# Patient Record
Sex: Female | Born: 1960
Health system: Southern US, Community
[De-identification: ages and names within clinical notes are randomized; demographics above are authoritative.]

## PROBLEM LIST (undated history)

## (undated) DIAGNOSIS — I499 Cardiac arrhythmia, unspecified: Secondary | ICD-10-CM

## (undated) DIAGNOSIS — S83281A Other tear of lateral meniscus, current injury, right knee, initial encounter: Secondary | ICD-10-CM

## (undated) DIAGNOSIS — R011 Cardiac murmur, unspecified: Secondary | ICD-10-CM

## (undated) DIAGNOSIS — M352 Behcet's disease: Secondary | ICD-10-CM

## (undated) DIAGNOSIS — R112 Nausea with vomiting, unspecified: Secondary | ICD-10-CM

## (undated) DIAGNOSIS — M810 Age-related osteoporosis without current pathological fracture: Secondary | ICD-10-CM

## (undated) DIAGNOSIS — I639 Cerebral infarction, unspecified: Secondary | ICD-10-CM

## (undated) DIAGNOSIS — H9319 Tinnitus, unspecified ear: Secondary | ICD-10-CM

## (undated) DIAGNOSIS — N719 Inflammatory disease of uterus, unspecified: Secondary | ICD-10-CM

## (undated) DIAGNOSIS — H539 Unspecified visual disturbance: Secondary | ICD-10-CM

## (undated) DIAGNOSIS — F32A Depression, unspecified: Secondary | ICD-10-CM

## (undated) DIAGNOSIS — I8003 Phlebitis and thrombophlebitis of superficial vessels of lower extremities, bilateral: Secondary | ICD-10-CM

## (undated) DIAGNOSIS — IMO0001 Reserved for inherently not codable concepts without codable children: Secondary | ICD-10-CM

## (undated) DIAGNOSIS — K589 Irritable bowel syndrome without diarrhea: Secondary | ICD-10-CM

## (undated) DIAGNOSIS — K9041 Non-celiac gluten sensitivity: Secondary | ICD-10-CM

## (undated) DIAGNOSIS — J189 Pneumonia, unspecified organism: Secondary | ICD-10-CM

## (undated) DIAGNOSIS — K529 Noninfective gastroenteritis and colitis, unspecified: Secondary | ICD-10-CM

## (undated) DIAGNOSIS — M359 Systemic involvement of connective tissue, unspecified: Secondary | ICD-10-CM

## (undated) DIAGNOSIS — M1990 Unspecified osteoarthritis, unspecified site: Secondary | ICD-10-CM

## (undated) DIAGNOSIS — K219 Gastro-esophageal reflux disease without esophagitis: Secondary | ICD-10-CM

## (undated) DIAGNOSIS — M21379 Foot drop, unspecified foot: Secondary | ICD-10-CM

## (undated) DIAGNOSIS — M542 Cervicalgia: Secondary | ICD-10-CM

## (undated) DIAGNOSIS — J45909 Unspecified asthma, uncomplicated: Secondary | ICD-10-CM

## (undated) DIAGNOSIS — Z9889 Other specified postprocedural states: Secondary | ICD-10-CM

## (undated) DIAGNOSIS — D573 Sickle-cell trait: Secondary | ICD-10-CM

## (undated) DIAGNOSIS — H5310 Unspecified subjective visual disturbances: Secondary | ICD-10-CM

## (undated) DIAGNOSIS — Z531 Procedure and treatment not carried out because of patient's decision for reasons of belief and group pressure: Secondary | ICD-10-CM

## (undated) DIAGNOSIS — G8929 Other chronic pain: Secondary | ICD-10-CM

## (undated) DIAGNOSIS — I219 Acute myocardial infarction, unspecified: Secondary | ICD-10-CM

## (undated) DIAGNOSIS — E2740 Unspecified adrenocortical insufficiency: Secondary | ICD-10-CM

## (undated) DIAGNOSIS — F329 Major depressive disorder, single episode, unspecified: Secondary | ICD-10-CM

## (undated) DIAGNOSIS — F419 Anxiety disorder, unspecified: Secondary | ICD-10-CM

## (undated) DIAGNOSIS — N281 Cyst of kidney, acquired: Secondary | ICD-10-CM

## (undated) DIAGNOSIS — H209 Unspecified iridocyclitis: Secondary | ICD-10-CM

## (undated) DIAGNOSIS — J340 Abscess, furuncle and carbuncle of nose: Secondary | ICD-10-CM

## (undated) DIAGNOSIS — M549 Dorsalgia, unspecified: Secondary | ICD-10-CM

## (undated) HISTORY — DX: Cardiac arrhythmia, unspecified: I49.9

## (undated) HISTORY — DX: Unspecified adrenocortical insufficiency: E27.40

## (undated) HISTORY — DX: Other tear of lateral meniscus, current injury, right knee, initial encounter: S83.281A

## (undated) HISTORY — PX: SUBMANDIBULAR MASS EXCISION: SHX5310

## (undated) HISTORY — PX: BACK SURGERY: SHX140

## (undated) HISTORY — DX: Behcet's disease: M35.2

## (undated) HISTORY — PX: OOPHORECTOMY: SHX86

## (undated) HISTORY — DX: Cardiac murmur, unspecified: R01.1

## (undated) HISTORY — DX: Inflammatory disease of uterus, unspecified: N71.9

## (undated) HISTORY — DX: Age-related osteoporosis without current pathological fracture: M81.0

## (undated) HISTORY — DX: Unspecified osteoarthritis, unspecified site: M19.90

## (undated) HISTORY — DX: Irritable bowel syndrome, unspecified: K58.9

## (undated) HISTORY — DX: Systemic involvement of connective tissue, unspecified: M35.9

## (undated) HISTORY — PX: HYSTEROSCOPY: SHX211

## (undated) HISTORY — PX: LUMBAR DISC SURGERY: SHX700

## (undated) HISTORY — PX: SPINE SURGERY: SHX786

## (undated) HISTORY — DX: Gastro-esophageal reflux disease without esophagitis: K21.9

## (undated) HISTORY — DX: Noninfective gastroenteritis and colitis, unspecified: K52.9

## (undated) HISTORY — PX: DILATION AND CURETTAGE OF UTERUS: SHX78

## (undated) HISTORY — PX: PELVIC LAPAROSCOPY: SHX162

## (undated) HISTORY — PX: AXILLARY SURGERY: SHX892

## (undated) HISTORY — DX: Non-celiac gluten sensitivity: K90.41

## (undated) HISTORY — DX: Unspecified subjective visual disturbances: H53.10

## (undated) HISTORY — DX: Unspecified iridocyclitis: H20.9

## (undated) HISTORY — PX: BREAST SURGERY: SHX581

---
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
## 1984-01-06 HISTORY — PX: BREAST EXCISIONAL BIOPSY: SUR124

## 1998-04-06 ENCOUNTER — Emergency Department (HOSPITAL_COMMUNITY): Admission: EM | Admit: 1998-04-06 | Discharge: 1998-04-06 | Payer: Self-pay | Admitting: Emergency Medicine

## 1998-05-06 HISTORY — PX: KNEE SURGERY: SHX244

## 1998-07-08 ENCOUNTER — Encounter: Payer: Self-pay | Admitting: Obstetrics and Gynecology

## 1998-07-12 ENCOUNTER — Ambulatory Visit (HOSPITAL_COMMUNITY): Admission: RE | Admit: 1998-07-12 | Discharge: 1998-07-12 | Payer: Self-pay | Admitting: Obstetrics and Gynecology

## 1998-07-24 ENCOUNTER — Other Ambulatory Visit: Admission: RE | Admit: 1998-07-24 | Discharge: 1998-07-24 | Payer: Self-pay | Admitting: Obstetrics and Gynecology

## 1998-08-09 ENCOUNTER — Ambulatory Visit (HOSPITAL_COMMUNITY): Admission: RE | Admit: 1998-08-09 | Discharge: 1998-08-09 | Payer: Self-pay | Admitting: Obstetrics and Gynecology

## 1998-08-09 ENCOUNTER — Encounter: Payer: Self-pay | Admitting: Obstetrics and Gynecology

## 2000-09-08 ENCOUNTER — Other Ambulatory Visit: Admission: RE | Admit: 2000-09-08 | Discharge: 2000-09-08 | Payer: Self-pay | Admitting: Gynecology

## 2001-01-06 ENCOUNTER — Observation Stay (HOSPITAL_COMMUNITY): Admission: AD | Admit: 2001-01-06 | Discharge: 2001-01-07 | Payer: Self-pay | Admitting: Gynecology

## 2001-01-06 ENCOUNTER — Encounter: Payer: Self-pay | Admitting: Gynecology

## 2001-03-07 ENCOUNTER — Inpatient Hospital Stay (HOSPITAL_COMMUNITY): Admission: AD | Admit: 2001-03-07 | Discharge: 2001-03-07 | Payer: Self-pay | Admitting: Gynecology

## 2001-03-08 ENCOUNTER — Encounter (INDEPENDENT_AMBULATORY_CARE_PROVIDER_SITE_OTHER): Payer: Self-pay

## 2001-03-08 ENCOUNTER — Inpatient Hospital Stay (HOSPITAL_COMMUNITY): Admission: AD | Admit: 2001-03-08 | Discharge: 2001-03-11 | Payer: Self-pay | Admitting: Gynecology

## 2001-03-12 ENCOUNTER — Encounter: Admission: RE | Admit: 2001-03-12 | Discharge: 2001-04-11 | Payer: Self-pay | Admitting: Gynecology

## 2001-04-12 ENCOUNTER — Encounter: Admission: RE | Admit: 2001-04-12 | Discharge: 2001-05-12 | Payer: Self-pay | Admitting: Gynecology

## 2001-04-20 ENCOUNTER — Other Ambulatory Visit: Admission: RE | Admit: 2001-04-20 | Discharge: 2001-04-20 | Payer: Self-pay | Admitting: Gynecology

## 2001-05-12 ENCOUNTER — Encounter: Admission: RE | Admit: 2001-05-12 | Discharge: 2001-06-11 | Payer: Self-pay | Admitting: Gynecology

## 2001-07-05 HISTORY — PX: HAND SURGERY: SHX662

## 2001-07-12 ENCOUNTER — Encounter: Admission: RE | Admit: 2001-07-12 | Discharge: 2001-08-11 | Payer: Self-pay | Admitting: Gynecology

## 2001-08-12 ENCOUNTER — Encounter: Admission: RE | Admit: 2001-08-12 | Discharge: 2001-09-11 | Payer: Self-pay | Admitting: Gynecology

## 2001-09-21 ENCOUNTER — Ambulatory Visit (HOSPITAL_COMMUNITY): Admission: RE | Admit: 2001-09-21 | Discharge: 2001-09-21 | Payer: Self-pay | Admitting: Orthopedic Surgery

## 2002-05-31 ENCOUNTER — Other Ambulatory Visit: Admission: RE | Admit: 2002-05-31 | Discharge: 2002-05-31 | Payer: Self-pay | Admitting: Obstetrics and Gynecology

## 2003-02-12 ENCOUNTER — Encounter (INDEPENDENT_AMBULATORY_CARE_PROVIDER_SITE_OTHER): Payer: Self-pay | Admitting: *Deleted

## 2003-02-12 ENCOUNTER — Ambulatory Visit (HOSPITAL_COMMUNITY): Admission: RE | Admit: 2003-02-12 | Discharge: 2003-02-12 | Payer: Self-pay | Admitting: Gastroenterology

## 2003-03-14 ENCOUNTER — Encounter: Admission: RE | Admit: 2003-03-14 | Discharge: 2003-03-14 | Payer: Self-pay | Admitting: Gastroenterology

## 2003-04-04 ENCOUNTER — Encounter: Admission: RE | Admit: 2003-04-04 | Discharge: 2003-04-04 | Payer: Self-pay | Admitting: Internal Medicine

## 2003-07-18 ENCOUNTER — Other Ambulatory Visit: Admission: RE | Admit: 2003-07-18 | Discharge: 2003-07-18 | Payer: Self-pay | Admitting: Obstetrics and Gynecology

## 2003-11-21 ENCOUNTER — Encounter: Admission: RE | Admit: 2003-11-21 | Discharge: 2003-11-21 | Payer: Self-pay | Admitting: Internal Medicine

## 2003-12-05 ENCOUNTER — Encounter: Admission: RE | Admit: 2003-12-05 | Discharge: 2003-12-05 | Payer: Self-pay | Admitting: Internal Medicine

## 2004-01-06 DIAGNOSIS — M352 Behcet's disease: Secondary | ICD-10-CM | POA: Insufficient documentation

## 2004-07-04 ENCOUNTER — Encounter: Admission: RE | Admit: 2004-07-04 | Discharge: 2004-07-04 | Payer: Self-pay | Admitting: Obstetrics and Gynecology

## 2004-08-06 ENCOUNTER — Other Ambulatory Visit: Admission: RE | Admit: 2004-08-06 | Discharge: 2004-08-06 | Payer: Self-pay | Admitting: Obstetrics and Gynecology

## 2004-12-05 HISTORY — PX: CHOLECYSTECTOMY: SHX55

## 2004-12-24 ENCOUNTER — Inpatient Hospital Stay (HOSPITAL_COMMUNITY): Admission: AD | Admit: 2004-12-24 | Discharge: 2004-12-26 | Payer: Self-pay | Admitting: General Surgery

## 2004-12-25 ENCOUNTER — Encounter (INDEPENDENT_AMBULATORY_CARE_PROVIDER_SITE_OTHER): Payer: Self-pay | Admitting: *Deleted

## 2005-01-09 ENCOUNTER — Encounter: Admission: RE | Admit: 2005-01-09 | Discharge: 2005-01-09 | Payer: Self-pay | Admitting: General Surgery

## 2005-03-05 ENCOUNTER — Encounter: Admission: RE | Admit: 2005-03-05 | Discharge: 2005-03-05 | Payer: Self-pay | Admitting: Internal Medicine

## 2005-04-01 ENCOUNTER — Encounter: Admission: RE | Admit: 2005-04-01 | Discharge: 2005-04-01 | Payer: Self-pay | Admitting: Obstetrics and Gynecology

## 2005-10-09 ENCOUNTER — Encounter: Admission: RE | Admit: 2005-10-09 | Discharge: 2005-10-09 | Payer: Self-pay | Admitting: Internal Medicine

## 2005-11-11 ENCOUNTER — Other Ambulatory Visit: Admission: RE | Admit: 2005-11-11 | Discharge: 2005-11-11 | Payer: Self-pay | Admitting: Obstetrics and Gynecology

## 2005-12-02 ENCOUNTER — Ambulatory Visit (HOSPITAL_BASED_OUTPATIENT_CLINIC_OR_DEPARTMENT_OTHER): Admission: RE | Admit: 2005-12-02 | Discharge: 2005-12-02 | Payer: Self-pay | Admitting: Obstetrics and Gynecology

## 2005-12-02 ENCOUNTER — Encounter (INDEPENDENT_AMBULATORY_CARE_PROVIDER_SITE_OTHER): Payer: Self-pay | Admitting: Specialist

## 2006-03-29 ENCOUNTER — Ambulatory Visit: Payer: Self-pay | Admitting: Internal Medicine

## 2006-07-06 ENCOUNTER — Inpatient Hospital Stay (HOSPITAL_COMMUNITY): Admission: EM | Admit: 2006-07-06 | Discharge: 2006-07-10 | Payer: Self-pay | Admitting: Emergency Medicine

## 2006-11-17 ENCOUNTER — Other Ambulatory Visit: Admission: RE | Admit: 2006-11-17 | Discharge: 2006-11-17 | Payer: Self-pay | Admitting: Obstetrics and Gynecology

## 2007-02-23 ENCOUNTER — Encounter: Admission: RE | Admit: 2007-02-23 | Discharge: 2007-02-23 | Payer: Self-pay | Admitting: Internal Medicine

## 2007-07-06 HISTORY — PX: VAGINAL HYSTERECTOMY: SUR661

## 2007-08-01 ENCOUNTER — Encounter: Payer: Self-pay | Admitting: Obstetrics and Gynecology

## 2007-08-01 ENCOUNTER — Ambulatory Visit (HOSPITAL_BASED_OUTPATIENT_CLINIC_OR_DEPARTMENT_OTHER): Admission: RE | Admit: 2007-08-01 | Discharge: 2007-08-01 | Payer: Self-pay | Admitting: Obstetrics and Gynecology

## 2007-09-14 ENCOUNTER — Ambulatory Visit: Payer: Self-pay | Admitting: Obstetrics and Gynecology

## 2008-08-03 ENCOUNTER — Encounter: Admission: RE | Admit: 2008-08-03 | Discharge: 2008-08-03 | Payer: Self-pay | Admitting: Internal Medicine

## 2009-05-29 ENCOUNTER — Other Ambulatory Visit: Admission: RE | Admit: 2009-05-29 | Discharge: 2009-05-29 | Payer: Self-pay | Admitting: Internal Medicine

## 2010-01-25 ENCOUNTER — Encounter: Payer: Self-pay | Admitting: Internal Medicine

## 2010-01-26 ENCOUNTER — Encounter: Payer: Self-pay | Admitting: Obstetrics and Gynecology

## 2010-03-17 ENCOUNTER — Other Ambulatory Visit: Payer: Self-pay | Admitting: Sports Medicine

## 2010-03-17 DIAGNOSIS — M545 Low back pain, unspecified: Secondary | ICD-10-CM

## 2010-03-19 ENCOUNTER — Ambulatory Visit
Admission: RE | Admit: 2010-03-19 | Discharge: 2010-03-19 | Disposition: A | Discharge status: Home / Self Care | Payer: Commercial Managed Care - PPO | Source: Ambulatory Visit | Attending: Sports Medicine | Admitting: Sports Medicine

## 2010-03-19 DIAGNOSIS — M545 Low back pain, unspecified: Secondary | ICD-10-CM

## 2010-05-20 NOTE — Op Note (Signed)
[name], [name]           ACCOUNT NO.:  [id number]   MEDICAL RECORD NO.:  [id number]          PATIENT TYPE:  AMB   LOCATION:  [facility]                         FACILITY:  [facility]   PHYSICIAN:  Daniel L. Gottsegen, M.D.DATE OF BIRTH:  [birth year]   DATE OF PROCEDURE:  08/01/2007  DATE OF DISCHARGE:                               OPERATIVE REPORT   PREOPERATIVE DIAGNOSES:  1. Endometriosis.  2. Pelvic pain.  3. Dysmenorrhea.  4. Menorrhagia.   POSTOPERATIVE DIAGNOSIS:  1. Endometriosis.  2. Pelvic pain.  3. Dysmenorrhea.  4. Menorrhagia.   NAME OF OPERATION:  Diagnostic laparoscopy with laparoscopic-assisted  vaginal hysterectomy, bilateral salpingo-oophorectomy.   SURGEON:  Edyth Gunnels, MD.   FIRST ASSISTANT:  Reynaldo Minium, MD.   ANESTHESIA:  General endotracheal.   INDICATIONS:  Patient is a 50 year old gravida 4, para 1, AB 3 who has  had progressively increasing dysmenorrhea, menorrhagia and pelvic pain.  She has a very positive history for endometriosis.  She has had 2  previous laparoscopies in her past with relief of pain both times, both  of these revealed endometriosis involving uterus, ovaries and pelvic  peritoneum.  She is now through with childbearing.  She has tried oral  progestins and other treatments for endometriosis but it has failed to  resolve her symptoms  She has Behcet's syndrome and she has been told by  her rheumatologist, Dr. Phylliss Bob, that this is the perfect window to have  her surgery as he feels her vasculitis will probably exacerbate again.  She therefore enters the hospital for the above.  She understands the  issues with removing both her ovaries at 46 and hormone replacement, but  feels that permanent pain relief is more beneficial then the risks.   FINDINGS:  At the time of surgery patient's left ovary and tube were  adherent to the broad ligament laterally.  There was obvious  endometriosis present both on the ovary and the pelvic  peritoneum.  The  uterus was enlarged by myomas to about twice normal size.  There was  endometriosis on the uterosacral ligaments and the serosa of the uterus.  The right ovary was slightly enlarged and on ultrasound it appeared to  have a small endometrioma and it was not opened during the surgery.  The  fallopian tube was normal.  There was some pelvic adhesive disease on  the right, just some endometriosis on the uterosacral ligaments and the  broad ligament.   PROCEDURE:  After adequate general endotracheal anesthesia, the patient  was placed in the dorsal supine position, prepped and draped in the  usual sterile manner.  A Hulka catheter was inserted into the patient's  uterus, a Foley catheter was inserted in the patient's bladder.  A small  subumbilical incision was made and extended vertically.  Using the  OptiVu, direct entry into the peritoneal cavity was done with a camera  without trauma.  Two 5 mm ports were placed in the right and left lower  quadrant.  The pelvis was as noted above.  First, the IP ligament on the  left was isolated, the ureter could be seen, the  IP ligament was  bipolared and cut.  Using a sharp scissor, the ovary could be separated  from peritoneum posteriorly, where it was adherent, without causing  bleeding and without injuring the ureter which could be visualized.  Following freeing up of the ovary, the rest of the broad ligament  attachments of the ovary and tube were bipolared and cut, the round  ligament was bipolared and cut, the vesicouterine fold of the peritoneum  was sharply incised.  This was somewhat difficult because of the  patient's previous cesarean section.  Attention was next turned to the  opposite side.  The ureter was once again identified.  The IP ligament  was bipolared and cut as was the other attachments of the broad ligament  to the ovary and tube.  The appendix was visualized and was normal.  The  round ligament was bipolared  and cut.  At this point, decision was made  to continue the surgery vaginally.  The patient was we repositioned.  A  1:200,000 solution of epinephrine was injected around the cervix in 1%  Xylocaine.  A 360 degree incision was made around the cervix.  The  bladder was mobilized superiorly as was the posterior peritoneum.  Posterior peritoneum was entered with sharp dissection as was the  vesicouterine fold of the peritoneum, uterosacrals, cardinals, uterine  arteries and the balance of the broad ligament were successfully  clamped, cut and suture ligated.  The uterus, ovaries and tubes could  now be removed through the vaginal incision without difficulty.  The  suture material for all of the above-mentioned pedicles was #1 chromic  catgut.  All major vascular bundles were doubly ligated.  The  uterosacral ligaments were shortened and sutured to the vault laterally  for good vault support.  It also should be noted that during the  laparoscopic portion of the procedure a portion of the left fallopian  tube had been sacrificed in order to see the broad ligament better.  We  had trouble finding it vaginally so the laparoscope at this point was  reinserted and the tube was found and was introduced into the cuff and  removed without difficulty.  The other issue that occurred during the  surgery was that we had a little bit of difficulty entering the  vesicouterine fold of the peritoneum initially.  We did not feel we had  injured it, but because of the patient's previous cesarean section we  injected indigo carmine through the Foley, this was injected until about  400 mL had been injected and the bladder was well distended and there  was no leak of the dye.  The fluid was then removed.  Copious irrigation  was done with sterilized saline. The vaginal cuff and peritoneum was  closed with interupted sutures of zero chromic.  The patient was  repositioned, the surgeons regloved.  We went above and  then once again  copious irrigation was done with sterile saline.  The pelvis could be  visualized.  There was no bleeding noted.  All cul-de-sac fluid was  removed.  The pneumoperitoneum evacuated.  The subumbilical fascial  incision was closed with #0 Vicryl and all 3 skin incisions were closed  with Steri-Strips.  The patient tolerated the procedure well and left  the operating room in satisfactory condition.  Two sponge, needle and  instrument counts were normal.  Blood loss was 200 cc.  The uterus,  tubes and ovarys were sent to pathology for tissue diagnosis.  The  foley  catheter at the termination of the procedure was draining clear urine.      Daniel L. Eda Paschal, M.D.  Electronically Signed     DLG/MEDQ  D:  08/01/2007  T:  08/01/2007  Job:  [id number]

## 2010-05-20 NOTE — H&P (Signed)
NAMECARRYE, GOLLER           ACCOUNT NO.:  [id number]   MEDICAL RECORD NO.:  [id number]          PATIENT TYPE:  EMS   LOCATION:  ED                           FACILITY:  [facility]   PHYSICIAN:  Marcellus Scott, MD     DATE OF BIRTH:  1960-12-06   DATE OF ADMISSION:  07/06/2006  DATE OF DISCHARGE:                              HISTORY & PHYSICAL   PRIMARY CARE PHYSICIAN:  Merlene Laughter. Renae Gloss, M.D.   RHEUMATOLOGIST:  Areatha Keas, M.D.   GASTROENTEROLOGIST:  Petra Kuba, M.D.   CHIEF COMPLAINT:  Abdominal pain, intractable diarrhea, nausea,  vomiting, low-grade fevers.   HISTORY OF PRESENT ILLNESS:  Ms. Prince Solian is a pleasant 50 year old  female patient with extensive past medical history as indicated below.  She was in her usual state of health until two days ago.  On the  afternoon of July 04, 2006, the patient ate out with her family which  included steak, potatoes and collard greens.  Four hours later, the  patient started experiencing diffuse burning, abdominal pain with sense  of gaseous distention.  The patient took some antacids/Dr. Pepper with  some relief in the symptoms following which she slept for a few hours  with some relief.  However, on the morning of June 30, the patient woke  up with diarrhea.  The patient has had multiple episodes of watery  explosive pea soup diarrhea all day yesterday with no associated blood  or mucous.  She continued to have diffuse burning, abdominal pain.  She  was able to take some soft diet in the earlier part of the day, but by  night, she started having nausea with multiple episodes of vomiting. Her  last episode of vomiting was at 4 a.m. today, and since then she has not  consumed anything orally. Also her diarrhea was also earlier at about 4  a.m. today. There was no blood or coffee grounds in the vomitus. The  patient also had had a low grade temperature of 101 degrees  Fahrenheit  last night.  She had some chills but no rigors.   The patient has  associated frontal severe headache which has resolved with IV fluid  hydration in the ER.  The patient also complains of photosensitivity in  both her eyes which she claims she gets whenever there is a flare of her  Behcte's disease.  The patient also has associated neck pain, low back  pain and left hip pain.  For these presentations, the patient has  presented to the emergency room for further evaluation and management.  No family member with similar complaints. No history of recent  antibiotics use. No recent travel.   PAST MEDICAL HISTORY:  1. Behcet's disease diagnosed in 2005 which has been fairly stable      since April 2008.  She usually has a waxing and waning pattern.      Tere is a flareup of her disease with any stressors. This is      manifested either neurologically like mononeuritis, multiplex,      uvulitis, rash like she had in January of 2008 following  terine      surgery. Her prednisone was then increased to 60 mg daily.  The      other manifestations of her Paget's have included GI manifestations      like microvascular colitis.  2. Colonoscopy 2006, negative.  3. Mammogram 2008, negative .  4. Gastroesophageal reflux disease.  5. Asthma since 1985.  6. Endometriosis.  7. Intermittent hematuria.  8. Acute renal failure in 1983 secondary to Bactrim.  9. ? Food related tachycardia.  10.Internal and external hemorrhoids.   PAST SURGICAL HISTORY:  1. Uterine polyp surgery, benign in November 2007.  2. Cholecystectomy.  3. Throglossal cyst excision.  4. Left submandibular mass excision-benign.  5. Lollie Sails tenosynovitis surgery.  6. Left knee surgery.  7. Three laparoscopies for endometriosis.  8. Cesarean section.  9. Breast cyst excision x3, all benign.   ALLERGIES:  1. MORPHINE CAUSES ITCHING AND CHANGE IN MENTAL STATUS.  2. CODEINE CAUSES ITCHING AND RASH.  3. SULFA CAUSES ACUTE RENAL FAILURE.  4. ERYTHROMYCIN WITH RASH AND ITCHING.   5. LATEX CAUSES RASH.  6. SULFITES IN FOOD PRESERVATIVES AND ARTIFICIAL SWEETENERS CAUSES      FLARE UPS OF HER GI SYMPTOMS.   CURRENT MEDICATIONS:  1. Prednisone 10 mg p.o. b.i.d. which is being tapered from 60 daily      in January 2008.  2. Azathioprine 100 mg p.o. b.i.d.  3. Prevacid 30 mg p.o. b.i.d.  4 . Cetirizine 10 mg p.o. daily.  5 . Astelin two sprays each nostril daily.  1. Veramyst 17.5 mcg once per each nostril daily.  2. Xopenex HFA p.r.n.  3. Imodium p.r.n. last p.m.  4. Multivitamin.  5. Glucosamine.   FAMILY HISTORY:  1 . Mother with history of diabetes, stroke, breast  cancer, currently nursing  home resident.  1. Dad died at age 50 from CA stomach.  2. Maternal grandfather died at age 53 from heart attack.  3. First maternal cousin died of a heart attack at age 6 .  66 . Sister at age 26 with breast cancer at age 15 years which is  recurrent.  6 . Sister with uterine cancer.   SOCIAL HISTORY:  The patient is married to a physician, has one 42  year old child. She is a Designer, jewellery and an Print production planner. The  patient denies any history of smoking alcohol or drug abuse.   REVIEW OF SYSTEMS:  1. Over 10 systems reviewed and pertinent for headaches which have      resolved in the emergency room.  Photosensitivity has improved.      Neck, back and left hip pain.  2. Dysuria and frequency.   PHYSICAL EXAMINATION:  GENERAL:  Ms. Prince Solian is a moderately built and  nourished female patient who is in no obvious distress and not toxic  looking.  VITAL SIGNS:  Temperature 97.4, blood pressure 101/61, pulse 96,  respirations 16, saturating 98% on room air.  HEENT:  Normocephalic, atraumatic.  Pupils equal 3mm, round and reactive  to light and accommodation.  Oral mucosa dry, but no oropharyngeal  erythema.  No obvious photophobia at this time.  NECK:  No JVD, carotid bruit, lymphadenopathy or goiter.  Supple.  RESPIRATIONS:  Clear to auscultation.   CARDIOVASCULAR:  First and second heart sounds heard.  No third or  fourth heart sounds.  No murmurs, rubs, gallops or clicks.  ABDOMEN:  Nondistended.  Mild tenderness in the left quadrants, but no  rigidity, guarding or  rebound.  No organomegaly or mass appreciated.  Bowel sounds normally heard.  NEUROLOGICAL:  The patient is awake, alert, oriented x3 with no focal  neurological deficits.  EXTREMITIES:  No cyanosis, clubbing or edema.  Peripheral pulses are  symmetrically felt.  SKIN:  Without any new rashes.   LABORATORY DATA:  Urinalysis with 21-50 white blood cell per high-  powered field, large amount of blood, 30 protein, large leukocytes.  Lipase 13.  Comprehensive metabolic panel remarkable for potassium 2.9,  BUN 7, creatinine 0.68, total bilirubin 1.8, alk-phos 54, AST 94, ALT  103, albumin 3.1.  CBC remarkable for white blood cell of 12.6,  hemoglobin 12.8, hematocrit 37, platelets 273.   Acute abdominal series. These are reviewed by me.   IMPRESSION:  1. No acute abnormality in the chest.  2. Air fluid levels in the colon compatible with liquid stool which      may be due to gastroenteritis.  No bowel obstruction.   CT of the head without contrast.  Impression is negative noncontrast  head CT.   ASSESSMENT/PLAN:  1. Acute gastroenteritis/food poisoning.  Will admit the patient to      the hospital.  Will send off stools for ova, cysts and parasites,      culture and C. difficile.  Will make patient nil per oral except      sips and ice chips.  Will hydrate with intravenous fluids and      provide pain medications and IV antiemetics p.r.n.  Will follow up      with an XRay KUB in the morning.  2. Dehydration secondary to acute GE -  for IV fluid hydration.  3. Hypokalemia secondary to acute gastroenteritis - Will replete IV      and follow a basic metabolic panel.  4. Leukocytosis secondary to acute GE/urinary tract      infection/stress/steroids - to follow CBC.   5. Transaminitis.  Etiology is unclear, but to follow hepatic panel in      the morning.  6. Behcet's disease.  I have discussed her case in detail with her      rheumatologist, and plan is for her to get Solu-Medrol 40 mg V q.6      h ( she has received Solumedrol 125mg  IV in ED) and hold her      azathioprine at this time.  If the patient were to improve within      the next 24-48 hours and is able to be discharged, she should go      home on prednisone 30 mg p.o. daily and resume azathioprine.      However, if she does not improve, to contact the      rheumatologist back again.  7. Questionable urinary tract infection - to send urine for culture      and place the patient on ciprofloxacin IV.  8 . Asthma which is stable at this time.      Marcellus Scott, MD  Electronically Signed     AH/MEDQ  D:  07/06/2006  T:  07/06/2006  Job:  [id number]   cc:   Merlene Laughter. Renae Gloss, M.D.  Fax: [phone]   Areatha Keas, M.D.  Fax: [phone]   Petra Kuba, M.D.  Fax: [phone]

## 2010-05-20 NOTE — Discharge Summary (Signed)
[name], [name]           ACCOUNT NO.:  [id number]   MEDICAL RECORD NO.:  [id number]          PATIENT TYPE:  INP   LOCATION:  1402                         FACILITY:  [facility]   PHYSICIAN:  Herbie Saxon, MDDATE OF BIRTH:  Jun 20, 1960   DATE OF ADMISSION:  07/06/2006  DATE OF DISCHARGE:  07/10/2006                               DISCHARGE SUMMARY   DISCHARGE DIAGNOSES:  1. Gastroenteritis, resolved.  2. Dehydration, resolved.  3. Hypokalemia, resolved.  4. Leukocytosis, resolved.  5. Elevated liver function tests, trending down.   RADIOLOGY:  The abdominal x-ray of July 07, 2006 shows nonspecific,  nonobstructive bowel gas pattern.  CT of the head on July 06, 2006 shows  negative findings.   HOSPITAL COURSE:  This 50 year old lady was admitted with abdominal  pain, diarrhea, nausea, vomiting, and low-grade fever.  She was also  complaining of photosensitivity of her eyes, which was probably a  manifestation of Behcet's disease flaring up. She was started on IV  fluid hydration, Cipro, p.r.n. antiemetics, and antacids.  The patient's  recurrent hypokalemia was supplemented.  Her clinic condition was  improved with increased energy and appetite.  However, she was still  constipated  with some abdominal distention, although there was no  tenderness on examination.  She was to go home.  She was also noted to  have a mild urinary tract infection and yeast infection.  She was  started on Diflucan and Magic mouthwash was used for oral thrush.  Of  note, the patient was noted to be having some borderline hyperglycemia,  probably due to the steroids.  She has been sent home with an Accu-Chek  machine to monitor her sugars.  A hemoglobin A1c on this admission was  6.1.   DISCHARGE CONDITION:  Stable.   DIET:  She will be on an 1800 to 2,000-calorie diet.   MEDICATIONS ON DISCHARGE:  1. Prednisone 40 mg daily for 1 week, 30 mg daily for 1 week, 20 mg      daily for 1 week, 10  mg daily until reviewed with rheumatologist.  2. Azathioprine 100 mg b.i.d.  3. Prevacid 30 mg b.i.d.  4. Cetirizine 10 mg daily.  5. Astelin 2 sprays daily.  6. Veramyst 17.5 mcg per nostril daily.  7. Xopenex 2 puffs p.r.n.  8. Multivitamin 1 table daily.  9. Glucosamine 500 mg b.i.d.   PHYSICAL EXAMINATION:  GENERAL:  On examination today, she is a friendly  lady not in acute distress.  VITAL SIGNS:  Temperature of 98, pulse of 67, respiratory rate 16, blood  pressure 110/60.  HEENT:  Pupils are equal and reactive to light and accommodation.  She  is mildly pale, not jaundiced.  No oral thrush.  EXTREMITIES:  There is no clubbing or cyanosis.  She is not dehydrated.  .  Peripheral pulses present.  No edema.  NECK:  Supple.  CHEST:  Clear.  Heart sounds 1 and 2 regular.  ABDOMEN:  There is some mild distention.  There is no tenderness.  Bowel  sounds are hypoactive.  NEURO:  She is alert and oriented x3.  LABORATORY DATA:  The WBC is 9, hematocrit 30, platelet count 208. ALT  is 54.  Potassium is 3.8, sodium 141, chloride 107, bicarbonate 27,  glucose 96, BUN 7, creatinine 0.6.  Urine culture negative so far.   DISPOSITION:  Discharge greater than 30 minutes.  Note the patient is to  have an abdominal x-ray, erect and supine.  She will be discharged, if  there are no acute findings.  She is to follow up with her primary care  physician in a week.  Follow up with Dr. Areatha Keas, rheumatologist in 3  days.  Follow up with gastroenterologist, Petra Kuba, in 1 to 2 weeks  to follow up on the abdominal distention.      Herbie Saxon, MD  Electronically Signed     MIO/MEDQ  D:  07/10/2006  T:  07/10/2006  Job:  [id number]   cc:   Merlene Laughter. Renae Gloss, M.D.  Fax: [phone]

## 2010-05-23 NOTE — Op Note (Signed)
NAMELEGACI, TARMAN           ACCOUNT NO.:  [id number]   MEDICAL RECORD NO.:  [id number]          PATIENT TYPE:  INP   LOCATION:  5013                         FACILITY:  [facility]   PHYSICIAN:  Leonie Man, M.D.   DATE OF BIRTH:  [birth year]   DATE OF PROCEDURE:  12/25/2004  DATE OF DISCHARGE:  12/26/2004                                 OPERATIVE REPORT   PREOPERATIVE DIAGNOSIS:  Dysfunctional gallbladder, probable dyskinesia.   POSTOPERATIVE DIAGNOSIS:  Dysfunctional gallbladder, probable dyskinesia.   PROCEDURE:  Laparoscopic cholecystectomy with intraoperative cholangiogram.   SURGEON:  Leonie Man, M.D.   ASSISTANT:  Gabrielle Dare. Janee Morn, M.D.   ANESTHESIA:  General.   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 She has been having increasingly frequent  episodes over the past several weeks.  Her previous gallbladder ultrasounds  have been negative for stones.  She did have a HIDA scan which showed  filling of the gallbladder but with significant delayed emptying up to 65  minutes. The patient comes to the operating room now for cholecystectomy.  She understands the risks and potential benefits of surgery as well as the  alternatives to surgery.  She gives consent.   DESCRIPTION OF PROCEDURE:  Following induction of satisfactory general  anesthesia with the patient positioned supinely, the abdomen was routinely  prepped and draped to be included in a sterile operative field.  Prior to  surgery, the patient was given 100 mg of Solu-Medrol because she is  chronically steroid dependent having been treated for Becket's syndrome.  The abdomen was prepped and draped to be included in a sterile operative  field.  Open laparoscopy created at the umbilicus with insertion of a Hassan  cannula and visual exploration of the abdomen was then  carried out.  The  liver edges were sharp.  Liver surfaces smooth.  The anterior gastric wall  and duodenal sweep appeared to be normal.  None of the small or large  intestine appeared to be abnormal.  There were no pelvic adhesions.   Under direct vision, epigastric and lateral ports were placed. The  gallbladder was grasped and retracted cephalad with dissection carried down  in the region of the ampulla and the hepatic duodenal ligament with  isolation of the cystic artery and cystic duct, the cystic artery being  traced up to into its entrance into the gallbladder wall, the cystic duct  being traced to the gallbladder cystic duct junction.  The common bile duct  could be clearly seen.  The cystic duct was then clipped proximally and  opened.  A cystic duct cholangiogram was carried out by passing a Cook  catheter into the abdomen and injecting and through the cystic duct and one  half-strength Hypaque under fluoroscopic guidance. The resulting  cholangiogram showed prompt flow of contrast into the extrahepatic biliary  system and down into the duodenum with normal tapering of the distal common  bile duct. Both the upper and lower hepatic ducts appeared to be normal.  There were no filling defects noted.  The cholangiocatheter was then  removed, and the cystic duct was triply clipped and transected.  The cystic  artery isolated, triply clipped and transected.  The gallbladder was  dissected free from the liver bed using electrocautery and maintaining  hemostasis throughout the entire course of the dissection.  The gallbladder  was then placed in Endopouch and retrieved through the umbilical port  without difficulty.  The right upper quadrant was thoroughly irrigated with  multiple aliquots of normal saline and aspirated.  The trocars were then  removed under direct vision and pneumoperitoneum deflated.  The abdominal  wound was then closed in layers as follows.  The umbilical wound into  layers  with 0 Vicryl and 4-0 Monocryl.  The epigastric and lateral flank wounds  were closed with 4-0 Monocryl and then reinforced with Steri-Strips.  Sterile dressings replaced in all of the wounds.  The anesthetic was  reversed and the patient was removed from the operating room to the recovery  room in stable condition.  She tolerated the procedure well.      Leonie Man, M.D.  Electronically Signed     PB/MEDQ  D:  12/25/2004  T:  12/28/2004  Job:  [id number]

## 2010-05-23 NOTE — Op Note (Signed)
[name], [name]           ACCOUNT NO.:  [id number]   MEDICAL RECORD NO.:  [id number]          PATIENT TYPE:  AMB   LOCATION:  [facility]                         FACILITY:  [facility]   PHYSICIAN:  Daniel L. Gottsegen, M.D.DATE OF BIRTH:  [birth year]   DATE OF PROCEDURE:  12/02/2005  DATE OF DISCHARGE:                               OPERATIVE REPORT   PREOPERATIVE DIAGNOSES:  1. Dysfunctional uterine bleeding.  2. Endometrial cavity defect.   POSTOPERATIVE DIAGNOSES:  1. Dysfunctional uterine bleeding.  2. Endometrial polyp.   OPERATIONS:  Hysteroscopy with excision of endometrial polyp.   SURGEON:  Daniel L. Eda Paschal, M.D.   ANESTHESIA:  General.   INDICATIONS:  The patient is a 49 year old gravida 3, para 1, AB 2 who  had presented to the office with significant change in her menstrual  pattern with severe menorrhagia.  Saline infusion histogram was done  which showed a 3+ centimeter, intrauterine cavity defect.  She enters  the hospital for hysteroscopy; and excision of the above.  Findings  external is normal, BUS is normal.  Vaginal is normal.  Cervix is clean.  Uterus is retroverted, normal size and shape.  Adnexa failed to reveal  masses.  At the time of hysteroscopy, the patient had a 3-4 cm  endometrial polyp originating from the anterior fundal wall.  Once it  had been removed, top of the fundus, tubal ostia, anterior posterior  walls of the fundus, lower uterine segment, and cervix were free of  disease.   DESCRIPTION OF PROCEDURE:  After adequate general anesthesia, the  patient was placed in the dorsal supine position, prepped and draped in  the usual sterile manner.  A single-tooth tenaculum was placed in the  anterior lip of the cervix.  The cervix was dilated to a #33 Pratt  dilator.  The hysteroscopic resectoscope was introduced; 3% sorbitol was  used to expand the intrauterine cavity; and a camera was used for  magnification.  The above pathology was  identified; and a 90-degree wire  loop was attached.  Using the appropriate Bovie settings; the polyp was  removed in multiple pieces, and sent to pathology for tissue diagnosis.  There was minimal bleeding which was controlled with the end of the wire  loop.  At the termination of the procedure, there was no bleeding noted.  Blood loss was minimal fluid deficit was under 100 mL.  The patient  tolerated the procedure well and left the operating room in satisfactory  condition.      Daniel L. Eda Paschal, M.D.  Electronically Signed    DLG/MEDQ  D:  12/02/2005  T:  12/02/2005  Job:  [id number]

## 2010-05-23 NOTE — H&P (Signed)
[facility]  Patient:    [name], [name] Visit Number: [id number] MRN: [id number]          Service Type: Attending:  Nadyne Coombes. Fontaine, M.D. Dictated by:   Nadyne Coombes. Fontaine, M.D. Adm. Date:  01/06/01                           History and Physical  CHIEF COMPLAINT:              Fatigue, shortness of breath.  HISTORY OF PRESENT ILLNESS:   A 50 year old G4 P0 AB3 female at approximately [redacted] weeks gestation who enters with a progressive increases in fatigue and lightheadedness.  Patient reports doing relatively well until several days ago, at which point she noticed that she was getting more easily fatigued, particularly with walking, and noted that whenever she stood still or sat that she would become light-headed, requiring her to lie down.  This acutely got worse today such that she felt as if she was going to pass out on several episodes, and she presents for evaluation.  Patient has otherwise had an unremarkable prenatal course to date.  She is being followed for asthma and uses inhalers on a p.r.n. basis.  She did recently undergo a rapid steroid taper, finishing oral prednisone dose several days ago although this seems to have preceded the onset of her symptoms.  She is also being followed for sickle trait and had a recent hemoglobin in our office on December 31, 2000 which was 10.1.  Patient denies any cough, sputum, or respiratory symptoms such as chest pain or significant shortness of breath.  She noticed some swelling in her lower extremities several days ago although this has resolved, and she had some mild right calf tenderness but again this resolved spontaneously.  She has no headaches, blurred vision.  No acute loss of consciousness or seizure-like activity.  She denies any cardiac history such as palpitations, chest pain.  The patients husband had taken her blood pressure in the office when she had these complaints and noted that  her blood pressure was in the 80/40-50 range.  She denies any obstetrical complaints such as uterine tenderness, vaginal bleeding, or any significant contractions.  PAST MEDICAL HISTORY:         Asthma, sickle trait, history of hypoglycemia, endometriosis.  PAST SURGICAL HISTORY:        Laparoscopy x 2, thyroglossal duct excision, breast cyst excision.  ALLERGIES:                    SULFA MEDICATIONS, as well MORPHINE, CODEINE.  REVIEW OF SYSTEMS:            Noncontributory.  SOCIAL HISTORY:               Noncontributory.  ADMISSION PHYSICAL EXAMINATION:  VITAL SIGNS:                  Afebrile.  Blood pressure 102/60, pulse 84, respirations normal.  HEENT:                        Normal.  LUNGS:                        Clear without rales or rhonchi.  CARDIAC:  Regular rate.  No rubs, murmurs, or gallops.  ABDOMEN:                      Benign.  Uterus is soft, nontender.  External monitors show a reactive fetus with no contractions.  PELVIC:                       Shows cervix to be long and closed.  EXTREMITIES:                  Lower extremities shows DTRs normal, no clonus. No swelling.  Calves soft, no tenderness.  ASSESSMENT:                   A 50 year old G4 P0 AB3 female at approximately [redacted] weeks gestation with apparent orthostatic hypotension symptoms and fatigue, seems to be worsening over the last several days.  She has no history pointing towards any specific pathologic system; no strong neurologic, respiratory, or cardiac history.  No evidence of acute blood loss, no evidence of abruption, and no good evidence of PE.  Will plan on initial screen to include an EKG, comprehensive metabolic panel, CBC, Kleihauer-Betke, thyroid functions, and an obstetrical ultrasound, rule out occult abruptio and check fetus for growth. Will IV hydrate with D-5-LR and monitor and reevaluate.  I suspect etiology is vascular hyperreactivity associated with her  pregnancy.  Feel no more aggressive evaluation at this time such as CT scanning for PE is needed, given her lack of symptomatology.  I discussed the plan with the patient and her husband and they both agree with the plan.  I will plan on reevaluating in the a.m. and reassess for discharge. Dictated by:   Nadyne Coombes. Fontaine, M.D. Attending:  Nadyne Coombes. Fontaine, M.D. DD:  01/06/01 TD:  01/06/01 Job: [id number]

## 2010-05-23 NOTE — H&P (Signed)
[name], [name]           ACCOUNT NO.:  [id number]   MEDICAL RECORD NO.:  [id number]          PATIENT TYPE:  INP   LOCATION:  5013                         FACILITY:  [facility]   PHYSICIAN:  Leonie Man, M.D.   DATE OF BIRTH:  04/16/1960   DATE OF ADMISSION:  12/24/2004  DATE OF DISCHARGE:                                HISTORY & PHYSICAL   PROBLEM:  Abdominal pain, bloating, and nausea and diarrhea.   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 These episodes have always been associated with fatty food  intake and they have been increasing in frequency over the last several  months. The patient has had gallbladder ultrasounds in the past which have  shown no evidence of stone. GI workup including colonoscopy, small bowel  series, have otherwise been nondiagnostic. At the height of her symptoms  today, the patient underwent a HIDA scan which showed an abnormal  gallbladder emptying and the study was associated with pain. Review of her  liver function studies show no abnormalities and her amylase and lipase are  all within normal limits. There is no associated leukocytosis or fever.   PAST MEDICAL HISTORY:  Medications:  1.  Prednisone 10 mg daily.  2.  Imuran 200 mg daily.  3.  AcipHex 20 mg daily.  4.  Sprintec 35 mcg/0.25 mg daily.  5.  Lubritex one daily.  6.  Multivitamins.   Allergies:  1.  MORPHINE.  2.  SULFA.  3.  CODEINE.  4.  LATEX.   Surgery:  1.  Excision thyroglossal duct cyst.  2.  Excision salivary gland cyst.  3.  Cesarean section x1.  4.  Tendon release of de Quervain's.  5.  Right-sided breast biopsy.  6.  Laparoscopy x3 for endometriosis.  7.  Left knee arthroscopy for chondromalacia.   Medical history:  1.  The patient has a history of Behcet's syndrome. I am not fully familiar      with this syndrome but it is the  cause of her being on Imuran and      prednisone at this time.  2.  She has a history of bronchial asthma, now well controlled because of      her steroid intake.  3.  History of endometriosis.   SOCIAL HISTORY:  This patient is a married black female. 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 She has one child. She has no tobacco  intake, no alcohol intake. She does not use recreational drugs.   FAMILY HISTORY:  Father deceased from gastric cancer. Mother is living with  diabetes. She has had a CVA and she has carcinoma of the breast. She has one  female sibling with a history of carcinoma of the breast. The patient  herself is being followed for bilateral breast calcifications which at this  point appear to be benign.   REVIEW OF SYSTEMS:  Review of systems is negative in detail except as  outlined above.   PHYSICAL EXAMINATION:  VITAL SIGNS:  Temperature 97.6, pulse 65,  respirations 16, blood pressure 110/61.  GENERAL:  No acute distress.  HEENT:  Head normocephalic. Pupils round, regular. There is a mild proptosis  without any associated lid lag. No nasal obstruction, benign oropharynx.  NECK:  Supple. No thyromegaly. No cervical adenopathy. Trachea is midline.  CHEST:  Lungs clear to auscultation.  HEART:  Regular rate and rhythm. I do not hear a murmur.  ABDOMEN:  The abdomen is soft, nontender, not distended. There are no  palpable masses. There is no palpable visceromegaly. Bowel sounds are  hypoactive.  EXTREMITIES:  There is no calf tenderness. There is no ankle edema.  NEUROLOGIC SCREENING:  Nonfocal.   ASSESSMENT:  1.  Abdominal pain, bloating, and diarrhea with associated abnormal HIDA      scan today indicative of gallbladder dysfunction.  2.  Bronchial asthma.  3.  Steroid-dependent disease (Behcet's).   The plan is for a laparoscopic cholecystectomy with intraoperative  cholangiogram. The patient will need to have preoperative and postoperative   increase in her steroids for the increased stress of surgery.      Leonie Man, M.D.  Electronically Signed     PB/MEDQ  D:  12/24/2004  T:  12/25/2004  Job:  [id number]

## 2010-05-23 NOTE — Op Note (Signed)
[name], [name]                       ACCOUNT NO.:  [id number]   MEDICAL RECORD NO.:  [id number]                   PATIENT TYPE:  AMB   LOCATION:  ENDO                                 FACILITY:  [facility]   PHYSICIAN:  Petra Kuba, M.D.                 DATE OF BIRTH:  [birth year]   DATE OF PROCEDURE:  02/12/2003  DATE OF DISCHARGE:                                 OPERATIVE REPORT   PROCEDURE:  Colonoscopy with biopsy.   ENDOSCOPIST:  Petra Kuba, M.D.   INDICATIONS FOR PROCEDURE:  A patient with multiple GI complaints.  All seem  to get better with prednisone.  I am going to rule out inflammatory bowel  disease.  A family history of colon polyps as well.  A consent was signed  after the risks, benefits, methods and options were thoroughly discussed in  the office.Marland Kitchen   MEDICINES USED:  Demerol 100 mg, Versed 10 mg.   DESCRIPTION OF PROCEDURE:  A rectal inspection was pertinent for external  hemorrhoids, small.  A digital exam was negative.  The video pediatric  adjustable colonoscope was inserted fairly easily and advanced around the  colon to the cecum.  This did require some abdominal pressure, but no  position changes.  No obvious abnormality was seen on insertion.  The cecum  was identified by the appendiceal orifice and the ileocecal valve.  In fact,  the scope was inserted a short ways up the terminal ileum, which was normal.  Photo documentation and scattered biopsies were obtained and put in the  first container.  The scope was slowly withdrawn.  The prep was fairly  adequate.  It did require some washing and suctioning prior to  visualization.  On a slow withdrawal through the colon, no abnormalities  were seen.  Random biopsies were obtained and put in the second container.  Once back in the rectum, an anal rectal pull-through and retroflexion  confirmed some small hemorrhoids.  The scope was reinserted a short ways up  the left side of the colon.  Air was  suctioned and the scope was removed.  The patient tolerated the procedure well.  There was no obvious immediate  complication.   ENDOSCOPIC DIAGNOSES:  1. Internal and external hemorrhoids.  2. Otherwise within normal limits to the terminal ileum, status post random     biopsies throughout.   PLAN:  Await pathology.  Consider a small bowel follow-through or even sprue  antibodies to complete the workup.  I will be happy to see back p.r.n. or in  six to eight weeks to recheck the symptoms and assist with any further  workup and plans.  Petra Kuba, M.D.    MEM/MEDQ  D:  02/12/2003  T:  02/12/2003  Job:  [id number]   cc:   Minerva Areola L. August Saucer, M.D.  [address] [address]  [place]  [place] [address]  Fax: [phone]   Merlene Laughter. Renae Gloss, M.D.  [address]  [address]  [place]  [place] [address]  Fax: [phone]   Aundra Dubin, M.D.

## 2010-05-23 NOTE — Op Note (Signed)
[facility]  Patient:    [name], [name] Visit Number: [id number] MRN: [id number]          Service Type: OBS Location: [facility] Attending Physician:  Tonye Royalty Dictated by:   Nadyne Coombes. Fontaine, M.D. Proc. Date: 03/08/01 Admit Date:  03/07/2001 Discharge Date: 03/07/2001                             Operative Report  PREOPERATIVE DIAGNOSES:       1. Pregnancy at term.                               2. Nonreassuring fetal tracing.  POSTOPERATIVE DIAGNOSES:      1. Pregnancy at term.                               2. Nonreassuring fetal tracing.  PROCEDURE:                    Primary low transverse cervical cesarean section.  SURGEON:                      Timothy P. Fontaine, M.D.  ANESTHESIA:                   Epidural.  ESTIMATED BLOOD LOSS:         Less than 500 cc.  COMPLICATIONS:                None.  SPECIMENS:                    Samples of cord blood, placenta and umbilical cord.  FINDINGS:                     At 8, a normal female with Apgars of 8 and 9, weight 7 lb 6 oz.  Pelvic anatomy noted to be normal.  DESCRIPTION OF PROCEDURE:     The patient was taken to the operating room and had her epidural catheter dosed.  She received an abdominal preparation with Betadine solution.  An indwelling Foley catheter was already in place.  The patient was draped in the usual fashion.  After assuring adequate anesthesia, the abdomen was sharply entered through a Pfannenstiel incision, achieving adequate hemostasis at all levels.  A bladder flap was then sharply and bluntly developed without difficulty.  The uterus was sharply entered in the lower uterine segment and bluntly extended laterally.  The bulging membranes were ruptured.  The fluid was noted to be clear.  The infants head was delivered through the incision, the nares and mouth suctioned and a nuchal cord reduced.  The rest of the infant was delivered.  The cord was  doubly clamped and cut and the infant was handed to pediatrics in attendance. Samples of cord blood were obtained.  The placenta was then spontaneously extruded and noted to be intact.  The uterus was exteriorized and the endometrial cavity was explored with a sponge to remove all placental membrane fragments.  The patient received 1 g of Cefotan IV prophylaxis at this time. The uterine incision was then closed in one layer using 0 Vicryl suture in a running interlocking stitch.  Several figure-of-eight sutures were then placed to achieve ultimate hemostasis.  The uterus was  returned to the abdomen, which was copiously irrigated, showing adequate hemostasis.  The anterior fascia was then reapproximated using 0 Vicryl in a running stitch.  The subcutaneous tissues were irrigated.  Adequate hemostasis was achieved with electrocautery.  The skin was reapproximated with staples.  A sterile dressing was applied. The patient was taken to the recovery room in good condition, having tolerated the procedure well. Dictated by:   Nadyne Coombes. Fontaine, M.D. Attending Physician:  Tonye Royalty DD:  03/08/01 TD:  03/09/01 Job: [id number]

## 2010-05-23 NOTE — Op Note (Signed)
   NAME:  Lynn, [name] NO.:  [id number]   MEDICAL RECORD NO.:  [id number]                   PATIENT TYPE:   LOCATION:                                       FACILITY:   PHYSICIAN:  Artist Pais. Mina Marble, M.D.           DATE OF BIRTH:  1960-06-15   DATE OF PROCEDURE:  09/21/2001  DATE OF DISCHARGE:                                 OPERATIVE REPORT   PREOPERATIVE DIAGNOSIS:  Right wrist de Quervain's tenosynovitis.   POSTOPERATIVE DIAGNOSIS:  Right wrist de Quervain's tenosynovitis.   PROCEDURE:  Right wrist first dorsal compartment release.   SURGEON:  Artist Pais. Mina Marble, M.D.   ASSISTANT:  None.   ANESTHESIA:  General.   TOURNIQUET TIME:  20 minutes.   COMPLICATIONS:  None.   OPERATIVE REPORT:  The patient was taken to the operating room and after  general anesthesia the right upper extremity was prepped and draped in the  usual sterile fashion.  An Esmarch was used to exsanguinate the limb and the  tourniquet was inflated to 250 mmHg. At this point in time a 2.5 to 2.0 cm  incision was made over the dorsal radial aspect of the wrist 1 cm proximal  through the top radial styloid.  The incision was taken down through the  skin and subcutaneous tissues. The cephalic vein and superficial radial  nerve were identified and retracted appropriately.  The first dorsal  compartment was identified.  The ETB and EHL tendons were identified  proximal to the first dorsal compartment.  They had separate and distinct  tunnels.  These were each released individually.  All adhesions were lysed.  After this was done the wound was thoroughly irrigated and closed with a  running 3-0 Prolene subcuticular stitch.  Steri-Strips, 4 x 4's, Fluffs and  a radial dorsal splint was applied. The patient tolerated the procedure well  and returned to the recovery room in stable fashion.                                               Artist Pais Mina Marble, M.D.    MAW/MEDQ   D:  09/21/2001  T:  09/21/2001  Job:  [id number]

## 2010-05-23 NOTE — Assessment & Plan Note (Signed)
[facility]                             PULMONARY OFFICE NOTE   CARI, BURGO                  MRN:          [id number]  DATE:03/29/2006                            DOB:          01-06-1960    This is a very nice 50 year old black female, never smoker, who had  developed allergies in her early 78s and after a pneumonia and was  seen by Dr. __________ , diagnosed with asthma and allergy and treated  with shots for several years. Stopped them for several years and then  started back on the shots under Dr. Lyla Son direction for allergies to  grasses, pollen, dust, dog and cat and cockroach. She tolerated the  shots for several years, but then developed severe itching and hives and  the shots stopped them in about five years ago. Since then, was  subsequently diagnosed with Behcet and placed on prednisone. This helped  quite a bit in terms of her symptoms of allergy and asthma until two  weeks ago with the onset of sinus congestion with persistent cough  since that time intermittently productive of thick brown mucus. Treated  with Avelox for five days with no improvement and severe cough and  dyspnea with minimal activity. She denies any pleuritic pain, but does  have a sensation of congestion in her chest that is worse with  activities or when she is around strong smells such as perfumes. She has  already been tried on Symbicort and Xopenex Proventil inhaler with no  apparent benefit.   PAST MEDICAL HISTORY:  Significant for Behcet disease as noted with a  flare up about the 1st of March, which interestingly was about a week  before her active respiratory problems began for which her prednisone  was increased to 60 mg daily and has been left there indefinite for now.   ALLERGIES:  CANNOT TOLERATE CODEINE, MORPHINE OR LATEX.   MEDICATIONS:  Taken in detail on the worksheet and include azathioprine.  See face sheet, dated March 29, 2006, for  full details.   SOCIAL HISTORY:  She has never smoked. 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   FAMILY HISTORY:  Is positive for allergies in her mother who was also a  smoker and also her father had sinus problems and asthma as well.   REVIEW OF SYSTEMS:  Taken in detail on the worksheet, negative, except  as outlined above.   PHYSICAL EXAMINATION:  This is a pleasant, black female in no acute  distress. She has stable vital signs. She does have a very harsh barking  quality upper airway cough with a grinding at the end of the cough.  HEENT: Is significant for severe nasal turbinate edema with nonspecific  features. Oropharynx is clear.  Dentition is intact. Ear canals are  clear bilaterally.  NECK: Supple, without cervical adenopathy or tenderness. Trachea is  midline without lymphadenopathy.  LUNGS: Lung fields reveal more pseudo than true wheezes with upper  airway quality sounds  made worse by coughing.  HEART: Regular rate and rhythm without murmur, gallop or rub.  ABDOMEN: Soft, benign.  EXTREMITIES: Warm without calf tenderness, cyanosis, clubbing or edema.   She has had no recent sinus or chest CT scans.   IMPRESSION:  Tracheal bronchitis, probably related to purulent nasal  drip with aspiration and is refractory to Avelox. The severity of the  cough also suggests to me a cyclical mechanism has developed.   RECOMMENDATIONS:  1. First, I reviewed with her nasal anatomy emphasizing the importance      of optimal nasal hygiene by using Afrin for five days to help      Nasacort reach the target tissues. She needs a followup sinus CT      scan in ten days and perhaps referral back to ENT if we have not      improved nasal complaints, which actually is what triggered this      present exacerbation.  2. To approach the cyclical cough, I have recommended  Prevacid 30 mg      b.i.d. perfectly regularly before first and last meal, Pepcid AC at      bedtime and to stop all mint, menthol and oil supplements to the      diet for the purpose of maximizing treatment of both acid and non-      acid forms of reflux. I have also asked her to stop all of her      inhalers in favor of Xopenex nebulizer every four hours p.r.n. and      for cough to use Vicodin 1/2 to 2 every 4 hours to supplement      Mucinex DM two b.i.d.   If not improved at the end of 10 days, a sinus CT scan will be obtained.  Note, that Dr. Lazarus Salines is her ENT of record and may need to followup her  sinus evaluation.     Charlaine Dalton. Sherene Sires, MD, [facility]  Electronically Signed    MBW/MedQ  DD: 03/29/2006  DT: 03/29/2006  Job #: [id number]

## 2010-05-23 NOTE — Discharge Summary (Signed)
[facility]  Patient:    [name], [name] Visit Number: [id number] MRN: [id number]          Service Type: Attending:  Gaetano Hawthorne. Lily Peer, M.D. Dictated by:   Antony Contras, [facility] Adm. Date:  03/08/01 Disc. Date: 03/11/01                             Discharge Summary  DISCHARGE DIAGNOSES:          1. Pregnancy at term.                               2. Nonreassuring fetal heart rate tracing.  PROCEDURE:                    Primary low cervical transverse cesarean                               section with delivery of viable infant.  HISTORY OF PRESENT ILLNESS:   The patient is a 50 year old gravida 4, para 0-0-3-0, with an LMP of June 17, 2000, and a corrected [facility] of March 24, 2001. Prenatal risk factors include patient has a history of endometriosis, hypoglycemia, asthma, advanced maternal age, sickle cell trait (father of the baby does have a normal electrophoresis).  MEDICATIONS:                  Ventolin, Flovent, and Advair.  PRENATAL LABORATORY DATA:     Blood type A positive.  Antibody screen negative.  RPR, HBsAG, HIV nonreactive.  Rubella immune.  HOSPITAL COURSE:              The patient was admitted on March 08, 2001, with spontaneous onset of labor.  Secondary to a nonreassuring fetal heart tracing, delivery was performed by cesarean section by Dr. Audie Box after epidural anesthesia.  The patient delivered a normal female infant, Apgars 8 and 9, 7 pounds 6 ounces, normal pelvic anatomy.  Postpartum course:  The patient remained afebrile, had no difficulty voiding, was able to be discharged in satisfactory condition on her third postoperative day.  CBC:  Hematocrit 27.6, hemoglobin 9.4, WBC 12.9, platelets 106 initially.  They were rechecked on March 08, 2001, and were 167.  DISPOSITION:                  The patient is to follow up in six weeks, continue with prenatal vitamins and iron, Motrin and Tylox for pain. Dictated by:   Antony Contras, [facility] Attending:  Gaetano Hawthorne. Lily Peer, M.D. DD:  03/28/01 TD:  03/29/01 Job: [id number]

## 2010-07-11 ENCOUNTER — Ambulatory Visit: Payer: 59 | Attending: Neurosurgery

## 2010-07-11 DIAGNOSIS — M6281 Muscle weakness (generalized): Secondary | ICD-10-CM | POA: Insufficient documentation

## 2010-07-11 DIAGNOSIS — IMO0001 Reserved for inherently not codable concepts without codable children: Secondary | ICD-10-CM | POA: Insufficient documentation

## 2010-07-11 DIAGNOSIS — M25559 Pain in unspecified hip: Secondary | ICD-10-CM | POA: Insufficient documentation

## 2010-07-11 DIAGNOSIS — R262 Difficulty in walking, not elsewhere classified: Secondary | ICD-10-CM | POA: Insufficient documentation

## 2010-07-16 ENCOUNTER — Ambulatory Visit: Payer: 59

## 2010-07-21 ENCOUNTER — Encounter: Payer: Commercial Managed Care - PPO | Admitting: Physical Therapy

## 2010-08-01 ENCOUNTER — Encounter: Payer: Commercial Managed Care - PPO | Admitting: Physical Therapy

## 2010-08-04 ENCOUNTER — Encounter (HOSPITAL_COMMUNITY)
Admission: RE | Admit: 2010-08-04 | Discharge: 2010-08-04 | Disposition: A | Discharge status: Home / Self Care | Payer: 59 | Source: Ambulatory Visit | Attending: Neurosurgery | Admitting: Neurosurgery

## 2010-08-04 LAB — HEPATIC FUNCTION PANEL
ALT: 22 U/L (ref 0–35)
AST: 22 U/L (ref 0–37)
Albumin: 3.5 g/dL (ref 3.5–5.2)
Alkaline Phosphatase: 54 U/L (ref 39–117)
Bilirubin, Direct: 0.2 mg/dL (ref 0.0–0.3)
Indirect Bilirubin: 0.7 mg/dL (ref 0.3–0.9)
Total Bilirubin: 0.9 mg/dL (ref 0.3–1.2)
Total Protein: 6.8 g/dL (ref 6.0–8.3)

## 2010-08-04 LAB — CBC
HCT: 37.9 % (ref 36.0–46.0)
Hemoglobin: 12.9 g/dL (ref 12.0–15.0)
MCH: 31.5 pg (ref 26.0–34.0)
MCHC: 34 g/dL (ref 30.0–36.0)
MCV: 92.4 fL (ref 78.0–100.0)
Platelets: 249 10*3/uL (ref 150–400)
RBC: 4.1 MIL/uL (ref 3.87–5.11)
RDW: 13.3 % (ref 11.5–15.5)
WBC: 6.4 10*3/uL (ref 4.0–10.5)

## 2010-08-04 LAB — BASIC METABOLIC PANEL
BUN: 10 mg/dL (ref 6–23)
CO2: 33 mEq/L — ABNORMAL HIGH (ref 19–32)
Calcium: 9.7 mg/dL (ref 8.4–10.5)
Chloride: 106 mEq/L (ref 96–112)
Creatinine, Ser: 0.51 mg/dL (ref 0.50–1.10)
GFR calc Af Amer: >60 mL/min (ref >60)
GFR calc non Af Amer: >60 mL/min (ref >60)
Glucose, Bld: 72 mg/dL (ref 70–99)
Potassium: 3.9 mEq/L (ref 3.5–5.1)
Sodium: 144 mEq/L (ref 135–145)

## 2010-08-04 LAB — SURGICAL PCR SCREEN
MRSA, PCR: NEGATIVE
Staphylococcus aureus: NEGATIVE

## 2010-08-04 LAB — NO BLOOD PRODUCTS

## 2010-08-13 ENCOUNTER — Encounter (HOSPITAL_COMMUNITY)
Admission: RE | Admit: 2010-08-13 | Discharge: 2010-08-13 | Disposition: A | Discharge status: Home / Self Care | Payer: 59 | Source: Ambulatory Visit | Attending: Neurosurgery | Admitting: Neurosurgery

## 2010-08-13 ENCOUNTER — Ambulatory Visit (HOSPITAL_COMMUNITY)
Admission: RE | Admit: 2010-08-13 | Discharge: 2010-08-14 | Disposition: A | Discharge status: Home / Self Care | Payer: 59 | Source: Ambulatory Visit | Attending: Neurosurgery | Admitting: Neurosurgery

## 2010-08-13 ENCOUNTER — Ambulatory Visit (HOSPITAL_COMMUNITY): Payer: 59

## 2010-08-13 ENCOUNTER — Other Ambulatory Visit (HOSPITAL_COMMUNITY): Payer: Self-pay | Admitting: Neurosurgery

## 2010-08-13 DIAGNOSIS — M5126 Other intervertebral disc displacement, lumbar region: Secondary | ICD-10-CM

## 2010-08-13 DIAGNOSIS — M5137 Other intervertebral disc degeneration, lumbosacral region: Secondary | ICD-10-CM | POA: Insufficient documentation

## 2010-08-13 DIAGNOSIS — Z01812 Encounter for preprocedural laboratory examination: Secondary | ICD-10-CM | POA: Insufficient documentation

## 2010-08-13 DIAGNOSIS — Z01818 Encounter for other preprocedural examination: Secondary | ICD-10-CM | POA: Insufficient documentation

## 2010-08-13 DIAGNOSIS — J45909 Unspecified asthma, uncomplicated: Secondary | ICD-10-CM | POA: Insufficient documentation

## 2010-08-13 DIAGNOSIS — IMO0002 Reserved for concepts with insufficient information to code with codable children: Secondary | ICD-10-CM | POA: Insufficient documentation

## 2010-08-13 DIAGNOSIS — M51379 Other intervertebral disc degeneration, lumbosacral region without mention of lumbar back pain or lower extremity pain: Secondary | ICD-10-CM | POA: Insufficient documentation

## 2010-08-18 NOTE — Op Note (Addendum)
  NAMECHRISTIE, VISCOMI           ACCOUNT NO.:  [id number]  MEDICAL RECORD NO.:  [id number]  LOCATION:  XRAY                         FACILITY:  [facility]  PHYSICIAN:  Hilda Lias, M.D.   DATE OF BIRTH:  Feb 18, 1960  DATE OF PROCEDURE:  08/13/2010 DATE OF DISCHARGE:                              OPERATIVE REPORT   PREOPERATIVE DIAGNOSIS:  Left L5-S1 chronic radiculopathy secondary to degenerative disk disease.  POSTOPERATIVE DIAGNOSIS:  Left L5-S1 chronic radiculopathy secondary to degenerative disk disease.  PROCEDURE:  Left L5-S1 foraminotomy, lysis of adhesions, diskectomy, decompression of the L5-S1 nerve root, microscope.  SURGEON:  Hilda Lias, MD.  ASSISTANT:  Danae Orleans. Venetia Maxon, MD.  CLINICAL HISTORY:  The patient is a 50 year old female, who was brought to [facility], complaining of back pain and left leg pain for at least 6 months.  The patient has failed conservative treatment.  X- rays showed that she had _hnp_________ with degenerative disk disease at the level of  L5-S1 bilaterally with bilateral foraminal narrowing, left worse than right side.  She had failed conservative treatment.  The patient want to proceed with surgery.  The patient knew the risk with the surgery including this possibility, but this might be a temporary procedure, which _will need further surgery_________ by decompression and fusion.  DESCRIPTION OF PROCEDURE:  The patient was taken to the OR.  After intubation, she was positioned in prone manner.  The back was cleaned with DuraPrep.  An x-ray with a needle showed that we were at the level of L5-S1.  Incision was made through the skin, subcutaneous tissue, through a thick adipose tissue straight down to the fascia.  Muscle retracted laterally.  We brought the microscope into the area.  With a drill, we drilled the lower lamina of L5, the upper of S1, and the yellow ligament was also removed.  What we found right away that the  S1 nerve root was immobilized with a quite a bit of adhesion.  Lysis was achieved.  Retraction of the thecal sac as well as the S1 nerve root was done.  The patient had large osteophyte with a small herniated disk. Incision was made and we introduced the 2-mm _pituitary rongeour_________  into the disk space and diskectomy was achieved.  We investigated the L5 nerve root and that was clear.  At the end, we had plenty of space for the thecal sac as well as the L5-S1 nerve root.  Then, the area was irrigated. Fentanyl and Depo-Medrol were left in the pleural space and the wound was closed with Vicryl and Steri-Strips.         ______________________________ Hilda Lias, M.D.    EB/MEDQ  D:  08/13/2010  T:  08/13/2010  Job:  [id number]  Electronically Signed by Hilda Lias M.D. on 08/23/2010 09:42:17 AM

## 2010-08-18 NOTE — H&P (Signed)
  [name], [name]           ACCOUNT NO.:  [id number]  MEDICAL RECORD NO.:  [id number]  LOCATION:                                 FACILITY:  PHYSICIAN:  Hilda Lias, M.D.   DATE OF BIRTH:  May 29, 1960  DATE OF ADMISSION: DATE OF DISCHARGE:                             HISTORY & PHYSICAL   The patient is a lady who is a 50 year old female who has been seen in my office since June 2012, complaining of back pain, radiation to the left leg which has going on for several months.  The patient has a hectic schedule and we attempted this only through the epidural injection without any improvement.  The pain is mostly going to the left leg, but once in while she complains of pain in the right leg.  The patient had an MRI.  In view of failure of conservative treatment, she decided to go ahead with surgery.  PAST MEDICAL HISTORY:  She had thyroglossal duct surgery, breast surgery for cyst, C-section, hysterectomy, exploratory laparotomy, and surgery on the hand.  ALLERGIES:  She is allergic to SULFA, CODEINE, MORPHINE, EPINEPHRINE, and TRAMADOL.  She takes prednisone, alendronate.  Also, she takes over the counter medication.  FAMILY HISTORY:  mother__died______ with a stroke and cancer of the breast. Father died at the age of 76 with cancer of the stomach.  SOCIAL HISTORY:  The patient still smokes_does not drink_________.  She is 5 feet 5 inches, 165 pounds.  REVIEW OF SYSTEMS:  Significant for back pain, left leg pain.  Difficult __walking________.  PHYSICAL EXAMINATION:  GENERAL:  The patient came to my office limping from the left leg. HEAD, EARS, NOSE AND THROAT:  Normal. NECK:  Normal.  There is a scar anteriorly. LUNGS:  Clear. HEART:  Heart sounds normal. ABDOMEN:  Normal. __________. NEUROLOGIC:  She has _some_________ weakness of the left foot 4/5 with absent __reflex________.  The myelogram showed that she has degenerative disk disease with bilateral  foraminal nerve root, left worse than right leg.  CLINICAL DIAGNOSIS:  Left L5-S1 degenerative disk disease with foraminal nerve root.  RECOMMENDATIONS:  The patient is being admitted for surgery.  The procedure will be L5-S1 foraminotomy and diskectomy.  She knew the risk including possibility of failure with the surgery, no improvement, CSF leak, infection, bleeding, and needed for the surgery which might require fusion.          ______________________________ Hilda Lias, M.D.     EB/MEDQ  D:  08/13/2010  T:  08/13/2010  Job:  [id number]  Electronically Signed by Hilda Lias M.D. on 08/18/2010 05:22:25 PM

## 2010-10-03 LAB — GLUCOSE, CAPILLARY
Glucose-Capillary: 106 — ABNORMAL HIGH
Glucose-Capillary: 107 — ABNORMAL HIGH
Glucose-Capillary: 128 — ABNORMAL HIGH
Glucose-Capillary: 137 — ABNORMAL HIGH

## 2010-10-21 LAB — CULTURE, BLOOD (ROUTINE X 2)
Culture: NO GROWTH
Culture: NO GROWTH

## 2010-10-21 LAB — COMPREHENSIVE METABOLIC PANEL
ALT: 103 — ABNORMAL HIGH
ALT: 61 — ABNORMAL HIGH
AST: 27
AST: 94 — ABNORMAL HIGH
Albumin: 3 — ABNORMAL LOW
Albumin: 3.1 — ABNORMAL LOW
Alkaline Phosphatase: 50
Alkaline Phosphatase: 54
BUN: 7
BUN: 7
CO2: 27
CO2: 27
Calcium: 8.1 — ABNORMAL LOW
Calcium: 8.6
Chloride: 103
Chloride: 107
Creatinine, Ser: 0.61
Creatinine, Ser: 0.68
GFR calc Af Amer: >60
GFR calc Af Amer: >60
GFR calc non Af Amer: >60
GFR calc non Af Amer: >60
Glucose, Bld: 91
Glucose, Bld: 96
Potassium: 2.9 — ABNORMAL LOW
Potassium: 3.3 — ABNORMAL LOW
Sodium: 139
Sodium: 141
Total Bilirubin: 0.8
Total Bilirubin: 1.8 — ABNORMAL HIGH
Total Protein: 6
Total Protein: 6.3

## 2010-10-21 LAB — CBC
HCT: 29.9 — ABNORMAL LOW
HCT: 37
Hemoglobin: 10.2 — ABNORMAL LOW
Hemoglobin: 12.8
MCHC: 34.1
MCHC: 34.6
MCV: 92.6
MCV: 94.7
Platelets: 208
Platelets: 273
RBC: 3.16 — ABNORMAL LOW
RBC: 4
RDW: 14
RDW: 14.3 — ABNORMAL HIGH
WBC: 12.6 — ABNORMAL HIGH
WBC: 9.6

## 2010-10-21 LAB — URINE MICROSCOPIC-ADD ON

## 2010-10-21 LAB — BASIC METABOLIC PANEL
BUN: 6
BUN: 7
CO2: 26
CO2: 27
Calcium: 7.8 — ABNORMAL LOW
Calcium: 7.8 — ABNORMAL LOW
Chloride: 112
Chloride: 112
Creatinine, Ser: 0.51
Creatinine, Ser: 0.55
GFR calc Af Amer: >60
GFR calc Af Amer: >60
GFR calc non Af Amer: >60
GFR calc non Af Amer: >60
Glucose, Bld: 107 — ABNORMAL HIGH
Glucose, Bld: 145 — ABNORMAL HIGH
Potassium: 3.8
Potassium: 3.8
Sodium: 142
Sodium: 143

## 2010-10-21 LAB — DIFFERENTIAL
Basophils Absolute: 0
Basophils Relative: 0
Eosinophils Absolute: 0
Eosinophils Relative: 0
Lymphocytes Relative: 6 — ABNORMAL LOW
Lymphs Abs: 0.7
Monocytes Absolute: 0.6
Monocytes Relative: 5
Neutro Abs: 11.2 — ABNORMAL HIGH
Neutrophils Relative %: 89 — ABNORMAL HIGH

## 2010-10-21 LAB — GLUCOSE, RANDOM: Glucose, Bld: 107 — ABNORMAL HIGH

## 2010-10-21 LAB — URINALYSIS, ROUTINE W REFLEX MICROSCOPIC
Bilirubin Urine: NEGATIVE
Glucose, UA: NEGATIVE
Ketones, ur: NEGATIVE
Nitrite: NEGATIVE
Protein, ur: 30 — AB
Specific Gravity, Urine: 1.015
Urobilinogen, UA: 1
pH: 5

## 2010-10-21 LAB — HEPATIC FUNCTION PANEL
ALT: 63 — ABNORMAL HIGH
AST: 36
Albumin: 2.3 — ABNORMAL LOW
Alkaline Phosphatase: 41
Bilirubin, Direct: 0.1
Indirect Bilirubin: 1 — ABNORMAL HIGH
Total Bilirubin: 1.1
Total Protein: 4.9 — ABNORMAL LOW

## 2010-10-21 LAB — POTASSIUM
Potassium: 3.7
Potassium: 3.8

## 2010-10-21 LAB — URINE CULTURE
Colony Count: NO GROWTH
Culture: NO GROWTH

## 2010-10-21 LAB — ALT: ALT: 54 — ABNORMAL HIGH

## 2010-10-21 LAB — HEMOGLOBIN A1C: Hgb A1c MFr Bld: 6.1

## 2010-10-21 LAB — LIPASE, BLOOD: Lipase: 13

## 2010-10-21 LAB — MAGNESIUM: Magnesium: 1.6

## 2010-11-05 ENCOUNTER — Ambulatory Visit
Admission: RE | Admit: 2010-11-05 | Discharge: 2010-11-05 | Disposition: A | Discharge status: Home / Self Care | Payer: 59 | Source: Ambulatory Visit | Attending: Internal Medicine | Admitting: Internal Medicine

## 2010-11-05 ENCOUNTER — Other Ambulatory Visit: Payer: Self-pay | Admitting: Internal Medicine

## 2010-11-05 DIAGNOSIS — Z1231 Encounter for screening mammogram for malignant neoplasm of breast: Secondary | ICD-10-CM

## 2010-12-19 ENCOUNTER — Encounter: Payer: Self-pay | Admitting: Pharmacist

## 2010-12-19 ENCOUNTER — Ambulatory Visit (INDEPENDENT_AMBULATORY_CARE_PROVIDER_SITE_OTHER): Payer: Self-pay | Admitting: Pharmacist

## 2010-12-19 DIAGNOSIS — M352 Behcet's disease: Secondary | ICD-10-CM

## 2010-12-19 DIAGNOSIS — R7302 Impaired glucose tolerance (oral): Secondary | ICD-10-CM

## 2010-12-19 DIAGNOSIS — R7309 Other abnormal glucose: Secondary | ICD-10-CM

## 2010-12-19 NOTE — Progress Notes (Signed)
  Subjective:    Patient ID: [name], female    DOB: 1960-07-08, 50 y.o.   MRN: [id number]  HPI Reviewed and agree with Dr. Macky Lower management.    Review of Systems     Objective:   Physical Exam        Assessment & Plan:

## 2010-12-19 NOTE — Assessment & Plan Note (Signed)
Following medication review, no suggestions for change.  Complete medication list provided to patient.  Total time in face to face medication review: 30 minutes.  Patient seen with: Hannah Lee, Pharmacy Resident 

## 2010-12-19 NOTE — Progress Notes (Signed)
  Subjective:    Patient ID: [name], female    DOB: 05-Jun-1960, 50 y.o.   MRN: [id number]  HPI Patient arrives in good spirits for medication review.  Reports seeing Dr. Pearson Grippe as primary care provider, Dr. Azzie Roup as rheumatologist,  Dr. Dorisann Frames as endocrinologist all at [facility]; Dr. Avie Echevaria as neurologist at [facility]; Dr. Hilda Lias [facility].  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      Review of Systems     Objective:   Physical Exam  BP 114/79  Pulse 91  Ht 5\' 5"  (1.651 m)  Wt 163 lb 4.8 oz (74.072 kg)  BMI 27.17 kg/m2       Assessment & Plan:  Following medication review, no suggestions for change.  Complete medication list provided to patient.  Total time in face to face medication review: 30 minutes.  Patient seen with: Maudry Mayhew, Pharmacy Resident

## 2011-10-31 ENCOUNTER — Other Ambulatory Visit: Payer: Self-pay | Admitting: Internal Medicine

## 2011-11-19 ENCOUNTER — Ambulatory Visit: Payer: 59 | Admitting: Pharmacist

## 2011-11-27 ENCOUNTER — Ambulatory Visit (INDEPENDENT_AMBULATORY_CARE_PROVIDER_SITE_OTHER): Payer: Self-pay | Admitting: Pharmacist

## 2011-11-27 ENCOUNTER — Encounter: Payer: Self-pay | Admitting: Pharmacist

## 2011-11-27 VITALS — BP 117/74 | HR 90 | Ht 67.0 in | Wt 174.6 lb

## 2011-11-27 DIAGNOSIS — M352 Behcet's disease: Secondary | ICD-10-CM

## 2011-11-27 NOTE — Patient Instructions (Addendum)
Thank you for coming in

## 2011-11-27 NOTE — Assessment & Plan Note (Signed)
.  Following medication review, no suggestions for change.  Complete medication list provided to patient.  Total time in face to face medication review: 10  minutes.  Patient seen with: Maryanna Shape PharmD, Pharmacy Resident.

## 2011-11-27 NOTE — Progress Notes (Signed)
  Subjective:    Patient ID: [name], female    DOB: 03-04-1960, 51 y.o.   MRN: [id number]  HPI  Patient arrives in good spirits for medication review. Reports seeing Dr. Pearson Grippe as primary care provider, Dr. Azzie Roup as rheumatologist, Dr. Dorisann Frames as endocrinologist all at [facility]; Dr. Avie Echevaria as neurologist at [facility]; Dr. Hilda Lias [facility]. 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    Review of Systems     Objective:   Physical Exam        Assessment & Plan:  .Following medication review, no suggestions for change.  Complete medication list provided to patient.  Total time in face to face medication review: 10  minutes.  Patient seen with: Maryanna Shape PharmD, Pharmacy Resident.

## 2011-11-27 NOTE — Progress Notes (Signed)
Patient ID: [name], female   DOB: 1960/04/13, 51 y.o.   MRN: [id number] Reviewed and agree with Dr. Macky Lower documentation and management.

## 2011-12-17 ENCOUNTER — Encounter: Payer: Self-pay | Admitting: Gynecology

## 2011-12-17 DIAGNOSIS — K529 Noninfective gastroenteritis and colitis, unspecified: Secondary | ICD-10-CM | POA: Insufficient documentation

## 2011-12-17 DIAGNOSIS — N840 Polyp of corpus uteri: Secondary | ICD-10-CM | POA: Insufficient documentation

## 2011-12-17 DIAGNOSIS — M352 Behcet's disease: Secondary | ICD-10-CM | POA: Insufficient documentation

## 2011-12-17 DIAGNOSIS — K219 Gastro-esophageal reflux disease without esophagitis: Secondary | ICD-10-CM | POA: Insufficient documentation

## 2011-12-17 DIAGNOSIS — K589 Irritable bowel syndrome without diarrhea: Secondary | ICD-10-CM | POA: Insufficient documentation

## 2011-12-17 DIAGNOSIS — N809 Endometriosis, unspecified: Secondary | ICD-10-CM | POA: Insufficient documentation

## 2011-12-29 ENCOUNTER — Encounter: Payer: Self-pay | Admitting: Obstetrics and Gynecology

## 2011-12-29 ENCOUNTER — Ambulatory Visit (INDEPENDENT_AMBULATORY_CARE_PROVIDER_SITE_OTHER): Payer: 59 | Admitting: Obstetrics and Gynecology

## 2011-12-29 VITALS — BP 124/82 | Ht 65.5 in | Wt 173.0 lb

## 2011-12-29 DIAGNOSIS — Z01419 Encounter for gynecological examination (general) (routine) without abnormal findings: Secondary | ICD-10-CM

## 2011-12-29 MED ORDER — ESTRADIOL 0.025 MG/24HR TD PTTW: 1.0000 | MEDICATED_PATCH | TRANSDERMAL | Status: DC

## 2011-12-29 NOTE — Progress Notes (Signed)
Patient came to see me today for her annual GYN exam. She is starting to have night sweats and vaginal dryness. She is not sexually active. She had a laparoscopic assisted vaginal hysterectomy and bilateral salpingo-oophorectomy in 2009 for severe endometriosis. She is having no pelvic pain. She is having no vaginal bleeding. She is due for her yearly mammogram.She does her bone densities through her rheumatologist and was recently started on Atelvia. She has a sister who had early onset breast cancer. She is a within normal Pap smears. Her last Pap smear was 2008.  HEENT: Within normal limits.[name] present. Neck: No masses. Supraclavicular lymph nodes: Not enlarged. Breasts: Examined in both sitting and lying position. Symmetrical without skin changes or masses. Abdomen: Soft no masses guarding or rebound. No hernias. Pelvic: External within normal limits. BUS within normal limits. Vaginal examination shows poor estrogen effect, no cystocele enterocele or rectocele. Cervix and uterus absent. Adnexa within normal limits. Rectovaginal confirmatory. Extremities within normal limits.  Assessment: #1. Menopausal symptoms #2. Endometriosis #3. Low bone mass  Plan: Patient started on Vivelle dot patch 0.025 mg twice a week. She knows we can increase the dose. Mammogram. Pap not done.The new Pap smear guidelines were discussed with the patient. Patient to check for sister to be sure she's been checked for BRCA1 and BRCA2.

## 2011-12-29 NOTE — Patient Instructions (Signed)
Schedule mammogram. Check with sister to be sure she has been checked for BRCA1 and BRCA2.

## 2011-12-30 LAB — URINALYSIS W MICROSCOPIC + REFLEX CULTURE
Bacteria, UA: NONE SEEN
Bilirubin Urine: NEGATIVE
Casts: NONE SEEN
Crystals: NONE SEEN
Glucose, UA: NEGATIVE mg/dL
Hgb urine dipstick: NEGATIVE
Ketones, ur: NEGATIVE mg/dL
Leukocytes, UA: NEGATIVE
Nitrite: NEGATIVE
Protein, ur: NEGATIVE mg/dL
Specific Gravity, Urine: 1.015 (ref 1.005–1.030)
Squamous Epithelial / LPF: NONE SEEN
Urobilinogen, UA: 0.2 mg/dL (ref 0.0–1.0)
pH: 5 (ref 5.0–8.0)

## 2012-01-06 DIAGNOSIS — I639 Cerebral infarction, unspecified: Secondary | ICD-10-CM

## 2012-01-06 HISTORY — DX: Cerebral infarction, unspecified: I63.9

## 2012-04-11 ENCOUNTER — Encounter: Payer: Self-pay | Admitting: Gynecology

## 2012-04-11 ENCOUNTER — Ambulatory Visit (INDEPENDENT_AMBULATORY_CARE_PROVIDER_SITE_OTHER): Payer: 59 | Admitting: Gynecology

## 2012-04-11 DIAGNOSIS — L293 Anogenital pruritus, unspecified: Secondary | ICD-10-CM

## 2012-04-11 DIAGNOSIS — B9689 Other specified bacterial agents as the cause of diseases classified elsewhere: Secondary | ICD-10-CM

## 2012-04-11 DIAGNOSIS — N898 Other specified noninflammatory disorders of vagina: Secondary | ICD-10-CM

## 2012-04-11 DIAGNOSIS — R8290 Unspecified abnormal findings in urine: Secondary | ICD-10-CM

## 2012-04-11 DIAGNOSIS — A499 Bacterial infection, unspecified: Secondary | ICD-10-CM

## 2012-04-11 DIAGNOSIS — N760 Acute vaginitis: Secondary | ICD-10-CM

## 2012-04-11 DIAGNOSIS — R82998 Other abnormal findings in urine: Secondary | ICD-10-CM

## 2012-04-11 LAB — URINALYSIS W MICROSCOPIC + REFLEX CULTURE
Bilirubin Urine: NEGATIVE
Casts: NONE SEEN
Crystals: NONE SEEN
Glucose, UA: NEGATIVE mg/dL
Ketones, ur: NEGATIVE mg/dL
Leukocytes, UA: NEGATIVE
Nitrite: NEGATIVE
Protein, ur: NEGATIVE mg/dL
Specific Gravity, Urine: 1.015 (ref 1.005–1.030)
Squamous Epithelial / LPF: NONE SEEN
Urobilinogen, UA: 0.2 mg/dL (ref 0.0–1.0)
pH: 7 (ref 5.0–8.0)

## 2012-04-11 LAB — WET PREP FOR TRICH, YEAST, CLUE
Trich, Wet Prep: NONE SEEN
WBC, Wet Prep HPF POC: NONE SEEN
Yeast Wet Prep HPF POC: NONE SEEN

## 2012-04-11 MED ORDER — METRONIDAZOLE 500 MG PO TABS: 500.0000 mg | ORAL_TABLET | Freq: Two times a day (BID) | ORAL | Status: DC

## 2012-04-11 NOTE — Progress Notes (Signed)
Patient presents complaining of vaginal odor and odor to her urine. A little bit of irritation. No discharge. Was evaluated by her primary physician and had a normal urinalysis. No fever chills nausea vomiting low back pain.  Exam was [facility] Spine straight no CVA tenderness. Abdomen soft nontender without masses guarding rebound organomegaly. Pelvic external BUS vagina with mild general erythema and moderate white discharge. Bimanual without masses or tenderness.  Assessment and plan: History and wet prep consistent with bacterial vaginosis. Will treat with Flagyl 500 mg twice daily for 7 days, alcohol avoidance reviewed. Urinalysis showed few bacteria otherwise unimpressive. Will culture and treat if needed per culture results. Followup if symptoms persist, worsen or recur.

## 2012-04-11 NOTE — Patient Instructions (Signed)
Take Flagyl medication twice daily for 7 days. Follow up if your symptoms persist, worsen or recur.

## 2012-04-13 ENCOUNTER — Other Ambulatory Visit: Payer: Self-pay | Admitting: *Deleted

## 2012-04-13 LAB — URINE CULTURE: Colony Count: >=100000

## 2012-04-13 MED ORDER — CIPROFLOXACIN HCL 250 MG PO TABS: 250.0000 mg | ORAL_TABLET | Freq: Two times a day (BID) | ORAL | Status: DC

## 2012-06-11 ENCOUNTER — Encounter (HOSPITAL_COMMUNITY): Payer: Self-pay | Admitting: *Deleted

## 2012-06-11 ENCOUNTER — Emergency Department (HOSPITAL_COMMUNITY): Payer: 59

## 2012-06-11 ENCOUNTER — Emergency Department (HOSPITAL_COMMUNITY)
Admission: EM | Admit: 2012-06-11 | Discharge: 2012-06-11 | Disposition: A | Discharge status: Home / Self Care | Payer: 59 | Attending: Emergency Medicine | Admitting: Emergency Medicine

## 2012-06-11 DIAGNOSIS — K219 Gastro-esophageal reflux disease without esophagitis: Secondary | ICD-10-CM | POA: Insufficient documentation

## 2012-06-11 DIAGNOSIS — R51 Headache: Secondary | ICD-10-CM | POA: Insufficient documentation

## 2012-06-11 DIAGNOSIS — Z9089 Acquired absence of other organs: Secondary | ICD-10-CM | POA: Insufficient documentation

## 2012-06-11 DIAGNOSIS — R141 Gas pain: Secondary | ICD-10-CM | POA: Insufficient documentation

## 2012-06-11 DIAGNOSIS — Z8719 Personal history of other diseases of the digestive system: Secondary | ICD-10-CM | POA: Insufficient documentation

## 2012-06-11 DIAGNOSIS — Z79899 Other long term (current) drug therapy: Secondary | ICD-10-CM | POA: Insufficient documentation

## 2012-06-11 DIAGNOSIS — Z8742 Personal history of other diseases of the female genital tract: Secondary | ICD-10-CM | POA: Insufficient documentation

## 2012-06-11 DIAGNOSIS — R142 Eructation: Secondary | ICD-10-CM | POA: Insufficient documentation

## 2012-06-11 DIAGNOSIS — R110 Nausea: Secondary | ICD-10-CM | POA: Insufficient documentation

## 2012-06-11 DIAGNOSIS — R5383 Other fatigue: Secondary | ICD-10-CM | POA: Insufficient documentation

## 2012-06-11 DIAGNOSIS — Z91040 Latex allergy status: Secondary | ICD-10-CM | POA: Insufficient documentation

## 2012-06-11 DIAGNOSIS — Z8619 Personal history of other infectious and parasitic diseases: Secondary | ICD-10-CM | POA: Insufficient documentation

## 2012-06-11 DIAGNOSIS — R197 Diarrhea, unspecified: Secondary | ICD-10-CM | POA: Insufficient documentation

## 2012-06-11 DIAGNOSIS — R079 Chest pain, unspecified: Secondary | ICD-10-CM | POA: Insufficient documentation

## 2012-06-11 DIAGNOSIS — Z90710 Acquired absence of both cervix and uterus: Secondary | ICD-10-CM | POA: Insufficient documentation

## 2012-06-11 DIAGNOSIS — Z9079 Acquired absence of other genital organ(s): Secondary | ICD-10-CM | POA: Insufficient documentation

## 2012-06-11 DIAGNOSIS — IMO0002 Reserved for concepts with insufficient information to code with codable children: Secondary | ICD-10-CM | POA: Insufficient documentation

## 2012-06-11 DIAGNOSIS — R1084 Generalized abdominal pain: Secondary | ICD-10-CM | POA: Insufficient documentation

## 2012-06-11 DIAGNOSIS — R109 Unspecified abdominal pain: Secondary | ICD-10-CM

## 2012-06-11 DIAGNOSIS — R5381 Other malaise: Secondary | ICD-10-CM | POA: Insufficient documentation

## 2012-06-11 LAB — CBC WITH DIFFERENTIAL/PLATELET
Basophils Absolute: 0 10*3/uL (ref 0.0–0.1)
Basophils Relative: 0 % (ref 0–1)
Eosinophils Absolute: 0 10*3/uL (ref 0.0–0.7)
Eosinophils Relative: 0 % (ref 0–5)
HCT: 39 % (ref 36.0–46.0)
Hemoglobin: 13.6 g/dL (ref 12.0–15.0)
Lymphocytes Relative: 23 % (ref 12–46)
Lymphs Abs: 2.3 10*3/uL (ref 0.7–4.0)
MCH: 31.1 pg (ref 26.0–34.0)
MCHC: 34.9 g/dL (ref 30.0–36.0)
MCV: 89.2 fL (ref 78.0–100.0)
Monocytes Absolute: 0.8 10*3/uL (ref 0.1–1.0)
Monocytes Relative: 8 % (ref 3–12)
Neutro Abs: 6.6 10*3/uL (ref 1.7–7.7)
Neutrophils Relative %: 69 % (ref 43–77)
Platelets: 197 10*3/uL (ref 150–400)
RBC: 4.37 MIL/uL (ref 3.87–5.11)
RDW: 14.6 % (ref 11.5–15.5)
WBC: 9.6 10*3/uL (ref 4.0–10.5)

## 2012-06-11 LAB — COMPREHENSIVE METABOLIC PANEL
ALT: 52 U/L — ABNORMAL HIGH (ref 0–35)
AST: 26 U/L (ref 0–37)
Albumin: 3.6 g/dL (ref 3.5–5.2)
Alkaline Phosphatase: 42 U/L (ref 39–117)
BUN: 14 mg/dL (ref 6–23)
CO2: 30 mEq/L (ref 19–32)
Calcium: 8.9 mg/dL (ref 8.4–10.5)
Chloride: 104 mEq/L (ref 96–112)
Creatinine, Ser: 0.47 mg/dL — ABNORMAL LOW (ref 0.50–1.10)
GFR calc Af Amer: >90 mL/min (ref >90)
GFR calc non Af Amer: >90 mL/min (ref >90)
Glucose, Bld: 82 mg/dL (ref 70–99)
Potassium: 3.6 mEq/L (ref 3.5–5.1)
Sodium: 141 mEq/L (ref 135–145)
Total Bilirubin: 1.2 mg/dL (ref 0.3–1.2)
Total Protein: 7.1 g/dL (ref 6.0–8.3)

## 2012-06-11 LAB — URINALYSIS, ROUTINE W REFLEX MICROSCOPIC
Bilirubin Urine: NEGATIVE
Glucose, UA: NEGATIVE mg/dL
Ketones, ur: NEGATIVE mg/dL
Leukocytes, UA: NEGATIVE
Nitrite: NEGATIVE
Protein, ur: NEGATIVE mg/dL
Specific Gravity, Urine: 1.016 (ref 1.005–1.030)
Urobilinogen, UA: 0.2 mg/dL (ref 0.0–1.0)
pH: 6 (ref 5.0–8.0)

## 2012-06-11 LAB — URINE MICROSCOPIC-ADD ON

## 2012-06-11 LAB — POCT I-STAT TROPONIN I: Troponin i, poc: 0 ng/mL (ref 0.00–0.08)

## 2012-06-11 LAB — CK: Total CK: 45 U/L (ref 7–177)

## 2012-06-11 MED ORDER — HYDROMORPHONE HCL PF 1 MG/ML IJ SOLN
1.0000 mg | Freq: Once | INTRAMUSCULAR | Status: AC
  Administered 2012-06-11: 1 mg via INTRAVENOUS
  Filled 2012-06-11: qty 1

## 2012-06-11 MED ORDER — HYDROMORPHONE HCL PF 1 MG/ML IJ SOLN
0.5000 mg | Freq: Once | INTRAMUSCULAR | Status: AC
  Administered 2012-06-11: 0.5 mg via INTRAVENOUS
  Filled 2012-06-11: qty 1

## 2012-06-11 MED ORDER — SODIUM CHLORIDE 0.9 % IV BOLUS (SEPSIS)
1000.0000 mL | Freq: Once | INTRAVENOUS | Status: AC
  Administered 2012-06-11: 1000 mL via INTRAVENOUS

## 2012-06-11 MED ORDER — METOCLOPRAMIDE HCL 5 MG/ML IJ SOLN
10.0000 mg | Freq: Once | INTRAMUSCULAR | Status: AC
  Administered 2012-06-11: 10 mg via INTRAVENOUS
  Filled 2012-06-11: qty 2

## 2012-06-11 MED ORDER — ONDANSETRON HCL 4 MG/2ML IJ SOLN
4.0000 mg | Freq: Once | INTRAMUSCULAR | Status: AC
  Administered 2012-06-11: 4 mg via INTRAVENOUS
  Filled 2012-06-11: qty 2

## 2012-06-11 MED ORDER — DICYCLOMINE HCL 20 MG PO TABS: 20.0000 mg | ORAL_TABLET | Freq: Two times a day (BID) | ORAL | Status: DC | PRN

## 2012-06-11 MED ORDER — ONDANSETRON 4 MG PO TBDP: 4.0000 mg | ORAL_TABLET | Freq: Three times a day (TID) | ORAL | Status: DC | PRN

## 2012-06-11 NOTE — ED Notes (Signed)
Bed:WA24<BR> Expected date:<BR> Expected time:<BR> Means of arrival:<BR> Comments:<BR>

## 2012-06-11 NOTE — ED Notes (Addendum)
Pt presents with 2 days of diarrhea related to Behcet's syndrome. Pt has muscle weakness which has increased over the last 2 days along with nausea and dull headache

## 2012-06-11 NOTE — ED Notes (Signed)
Patient transported to CT 

## 2012-06-11 NOTE — ED Provider Notes (Signed)
History     CSN: [id number]  Arrival date & time 06/11/12  0905   First MD Initiated Contact with Patient 06/11/12 [phone]      Chief Complaint  Patient presents with  . Abdominal Pain  . Nausea  . Diarrhea    (Consider location/radiation/quality/duration/timing/severity/associated sxs/prior treatment) HPI Comments: Pt with PMH significant for endometriosis, IBS, acid reflux, colitis, presents to the ED for diarrhea x 2 days associated with abdominal cramping, bloating, generalized muscle fatigue, and dull headache.  Headache not associated with photophobia, phonophobia, aura, neck pain, or confusion.  No hx of migraines.  Pt has a dx of Behcet's syndrome and was recently started on a new medication in April, most recent dose May 9th.  Pt states she has not "felt right" since taking this medication.  Has attempted to continue eating and drinking regularly but states everything "runs right through her."  No recent abx use. States she is aware that these symptoms she is experiencing can be typical of Behcet's but she is concerned there may be underlying pathology.  Prednisone was recently increased to 20 mg daily.  She has also increased her Prevacid to 30 mg daily.  Patient endorses intermittent right-sided chest pain, which she describes as an indigestion-like sensation. Pain does not radiate into the extremities is not associated with SOB, palpitations, dizziness, or AMS.  No prior cardiac hx.  Pt has LE muscle weakness and intermittent numbness and paresthesias of right foot which is not of new onset.  She attempts to walk daily to keep her leg muscles from atrophying.  The history is provided by the patient.    Past Medical History  Diagnosis Date  . Endometriosis   . Endometrial polyp   . IBS (irritable bowel syndrome)   . Acid reflux   . Colitis   . Behcet's syndrome     Past Surgical History  Procedure Laterality Date  . Cholecystectomy    . Pelvic laparoscopy      DL  .  Dilation and curettage of uterus    . Hysteroscopy    . Cesarean section    . Knee surgery    . Hand surgery    . Breast surgery      Cysts excised  . Oophorectomy      BSO  . Vaginal hysterectomy      LAVH BSO  . Back surgery      Family History  Problem Relation Age of Onset  . Diabetes Mother   . Breast cancer Mother     Age 97's  . Stroke Mother   . Gastric cancer Father   . Diabetes Sister   . Hypertension Sister   . Breast cancer Sister     Age 14    History  Substance Use Topics  . Smoking status: Never Smoker   . Smokeless tobacco: Never Used  . Alcohol Use: No    OB History   Grav Para Term Preterm Abortions TAB SAB Ect Mult Living   3 1 1  2     1       Review of Systems  Gastrointestinal: Positive for nausea, abdominal pain, diarrhea and abdominal distention.  Neurological: Positive for headaches.  All other systems reviewed and are negative.    Allergies  Celebrex; Latex; Sulfa drugs cross reactors; Sulfites; Epinephrine; Erythromycin; and Morphine and related  Home Medications   Current Outpatient Rx  Name  Route  Sig  Dispense  Refill  . acetaminophen (TYLENOL) 500 MG  tablet   Oral   Take 1 tablet (500 mg total) by mouth every 6 (six) hours as needed for pain.         Marland Kitchen azaTHIOprine (IMURAN) 50 MG tablet   Oral   Take 2 tablets (100 mg total) by mouth 2 (two) times daily.         . Calcium-Magnesium-Zinc 333-133-5 MG TABS   Oral   Take 1 tablet by mouth 2 (two) times daily. Plus vitamin D 1000 IU per tablet         . cetirizine (ZYRTEC) 10 MG chewable tablet   Oral   Chew 10 mg by mouth at bedtime. For Remicade pre-med and seasonal allergies         . ciprofloxacin (CIPRO) 250 MG tablet   Oral   Take 1 tablet (250 mg total) by mouth 2 (two) times daily.   6 tablet   0   . Coconut Oil 1000 MG CAPS   Oral   Take 1,000 mg by mouth 2 (two) times daily.         . colchicine 0.6 MG tablet   Oral   Take 1 tablet (0.6 mg  total) by mouth 2 (two) times daily.         Marland Kitchen estradiol (VIVELLE-DOT) 0.025 MG/24HR   Transdermal   Place 1 patch onto the skin 2 (two) times a week.   8 patch   12   . Glucosamine-Vitamin D (GLUCOSAMINE PLUS VITAMIN D) 1500-200 MG-UNIT TABS   Oral   Take 1 tablet by mouth 2 (two) times daily. Dose is 2000 mg of glucosamine and 2000 IU of vitamin D         . lansoprazole (PREVACID SOLUTAB) 15 MG disintegrating tablet   Oral   Take 15 mg by mouth at bedtime. For reflux and gastritis related to prednisone         . metroNIDAZOLE (FLAGYL) 500 MG tablet   Oral   Take 1 tablet (500 mg total) by mouth 2 (two) times daily. For 7 days.  Avoid alcohol while taking   14 tablet   0   . Multiple Vitamins-Minerals (WOMENS MULTIVITAMIN PLUS) TABS   Oral   Take 1 tablet by mouth daily.         Marland Kitchen oxybutynin (DITROPAN) 5 MG tablet   Oral   Take 5 mg by mouth 3 (three) times daily.         . predniSONE (DELTASONE) 5 MG tablet   Oral   Take 10 mg by mouth every morning.          . Risedronate Sodium (ATELVIA) 35 MG TBEC   Oral   Take by mouth.           BP 131/77  Pulse 79  Temp(Src) 98.3 F (36.8 C) (Oral)  Resp 16  SpO2 97%  Physical Exam  Nursing note and vitals reviewed. Constitutional: She is oriented to person, place, and time. She appears well-developed and well-nourished.  HENT:  Head: Normocephalic and atraumatic.  Mouth/Throat: Oropharynx is clear and moist.  Mildly dry mucus membranes  Eyes: Conjunctivae and EOM are normal. Pupils are equal, round, and reactive to light.  Neck: Normal range of motion. Neck supple. No rigidity.  No meningeal signs  Cardiovascular: Normal rate, regular rhythm and normal heart sounds.   Pulmonary/Chest: Effort normal and breath sounds normal.  Abdominal: She exhibits distension (mild). There is generalized tenderness. There is no CVA tenderness, no tenderness at  McBurney's point and negative Murphy's sign.   Musculoskeletal: Normal range of motion. She exhibits no edema.  Neurological: She is alert and oriented to person, place, and time. She has normal strength. She displays no tremor. No cranial nerve deficit or sensory deficit. She displays no seizure activity.  CN grossly intact, moves all extremities appropriately, equal grip strength bilaterally, no facial droop appreciated, extremity sensation intact  Skin: Skin is warm and dry.  Psychiatric: She has a normal mood and affect.    ED Course  Procedures (including critical care time)   Date: 06/11/2012  Rate: 68  Rhythm: normal sinus rhythm  QRS Axis: normal  Intervals: normal  ST/T Wave abnormalities: normal  Conduction Disutrbances:none  Narrative Interpretation: NSR, no STEMI  Old EKG Reviewed: unchanged    Labs Reviewed  COMPREHENSIVE METABOLIC PANEL - Abnormal; Notable for the following:    Creatinine, Ser 0.47 (*)    ALT 52 (*)    All other components within normal limits  URINALYSIS, ROUTINE W REFLEX MICROSCOPIC - Abnormal; Notable for the following:    Hgb urine dipstick MODERATE (*)    All other components within normal limits  CBC WITH DIFFERENTIAL  CK  URINE MICROSCOPIC-ADD ON  POCT I-STAT TROPONIN I   Dg Abd Acute W/chest  06/11/2012   *RADIOLOGY REPORT*  Clinical Data:  Abdominal pain, nausea and diarrhea  ACUTE ABDOMEN SERIES (ABDOMEN 2 VIEW & CHEST 1 VIEW)  Comparison: Prior abdominal radiographs 07/10/2006  Findings: The lungs are clear.  Cardiac and mediastinal contours within normal limits.  No pneumothorax, pleural effusion or suspicious pulmonary nodule.  Surgical clips in the right upper quadrant suggest prior cholecystectomy.  The bowel gas pattern is nonobstructive.  Gas is noted throughout the colon to the level of the rectum.  No evidence of obstruction.  No free air or large ascites.  No organomegaly or calcification.  Very mild rotary levoconvex scoliosis of the lower lumbar spine with L5-S1 facet  arthropathy.  No acute osseous abnormality.  IMPRESSION:  1.  No acute cardiopulmonary disease 2.  Unremarkable, nonobstructed bowel gas pattern.   Original Report Authenticated By: Malachy Moan, M.D.     1. Diarrhea   2. Nausea   3. Abdominal cramping       MDM   EKG NSR, no acute ischemic changes.  Trop negative. Imaging negative for acute findings. Labs largely WNL as above.  U/a with moderate blood which pt states is normal for her during flares.  Muscle weakness is not of new onset and i do not suspect stroke.  Cardiac work-up negative- low suspicion for ACS, PE, or dissection.  Headache resolved and GI sx improved with IVF, dilaudid, and anti-emetics.  Pt tolerating PO prior to d/c.  She will FU with her rheumatologist as previously scheduled.  Rx bentyl and zofran.  Discussed plan with pt, she agreed.  Return precautions advised.       Garlon Hatchet, PA-C 06/12/12 [phone]

## 2012-06-12 NOTE — ED Provider Notes (Signed)
Medical screening examination/treatment/procedure(s) were performed by non-physician practitioner and as supervising physician I was immediately available for consultation/collaboration.   Larae Caison, MD 06/12/12 0703 

## 2012-07-13 ENCOUNTER — Other Ambulatory Visit: Payer: Self-pay

## 2012-07-13 DIAGNOSIS — Z1231 Encounter for screening mammogram for malignant neoplasm of breast: Secondary | ICD-10-CM

## 2012-07-14 ENCOUNTER — Ambulatory Visit (INDEPENDENT_AMBULATORY_CARE_PROVIDER_SITE_OTHER): Payer: 59 | Admitting: Neurology

## 2012-07-14 ENCOUNTER — Encounter: Payer: Self-pay | Admitting: Neurology

## 2012-07-14 VITALS — BP 116/70 | HR 79 | Ht 66.0 in | Wt 182.0 lb

## 2012-07-14 DIAGNOSIS — M352 Behcet's disease: Secondary | ICD-10-CM

## 2012-07-14 DIAGNOSIS — H5310 Unspecified subjective visual disturbances: Secondary | ICD-10-CM

## 2012-07-14 HISTORY — DX: Unspecified subjective visual disturbances: H53.10

## 2012-07-14 NOTE — Progress Notes (Signed)
Reason for visit: Visual disturbance  [name] is a 52 y.o. female  History of present illness:  [name] is a 52 year old right-handed black female with a history of Behcet's disease. The patient presented with a polyradiculitis involving the lumbosacral nerve roots, and she was seen previously in 2007 by Dr. Sandria Manly. The patient has been on therapy since that time, and she has been on Imuran and Remicade for number of years. The patient indicates that in January 2014, she began having some problems with left leg weakness. The patient has had increased fatigue since June of 2013. The patient has had a fall in January 2014. The patient was switched to Shon Millet in March of 2014. The second dose was given in May of 2014, and the patient developed swelling in the body, headache, increased fatigue and shortness of breath. The patient had an increase in the prednisone dose from 10 mg daily to a 60 mg daily dose. The patient remained on this dose for about one month. The patient is back down to her 10 mg daily dose. Around 05/13/2012, the patient developed double vision that had a horizontal and vertical component. The patient began developing blurred vision of both eyes, and left eye discomfort. The patient has been seen by her ophthalmologist, and it was felt that she had a left optic neuritis. The patient has noted some color changes in the left eye, with a pink washout of color. The patient has noted some problems with urinary urgency and incontinence since January 2014. The patient was given a trial on oxybutynin. The patient is sent to this office for further evaluation.  Past Medical History  Diagnosis Date  . Endometriosis   . Endometrial polyp   . IBS (irritable bowel syndrome)   . Acid reflux   . Colitis   . Behcet's syndrome   . GERD (gastroesophageal reflux disease)   . Uveitis   . Osteopenia   . Diabetes mellitus type II, controlled   . Adrenal insufficiency   .  Subjective visual disturbance 07/14/2012    Past Surgical History  Procedure Laterality Date  . Cholecystectomy  12/2004  . Pelvic laparoscopy      DL  . Dilation and curettage of uterus    . Hysteroscopy    . Cesarean section  03/2001  . Knee surgery Left 05/1998  . Hand surgery    . Breast surgery      Cysts excised  . Oophorectomy      BSO  . Vaginal hysterectomy  07/2007    [facility] BSO  . Back surgery      Family History  Problem Relation Age of Onset  . Diabetes Mother   . Breast cancer Mother     Age 63's  . Stroke Mother   . Gastric cancer Father   . Diabetes Sister   . Hypertension Sister   . Breast cancer Sister     Age 82    Social history:  reports that she has never smoked. She has never used smokeless tobacco. She reports that she does not drink alcohol or use illicit drugs.  Medications:  Current Outpatient Prescriptions on File Prior to Visit  Medication Sig Dispense Refill  . acetaminophen (TYLENOL) 500 MG tablet Take 1 tablet (500 mg total) by mouth every 6 (six) hours as needed for pain.      Marland Kitchen azaTHIOprine (IMURAN) 50 MG tablet Take 2 tablets (100 mg total) by mouth 2 (two) times daily.      Marland Kitchen  Calcium-Magnesium-Zinc 333-133-5 MG TABS Take 1 tablet by mouth 2 (two) times daily.       . cetirizine (ZYRTEC) 10 MG tablet Take 10 mg by mouth at bedtime.      . Coconut Oil 1000 MG CAPS Take 1,000 mg by mouth 2 (two) times daily.      . colchicine 0.6 MG tablet Take 1 tablet (0.6 mg total) by mouth 2 (two) times daily.      Marland Kitchen dicyclomine (BENTYL) 20 MG tablet Take 1 tablet (20 mg total) by mouth 2 (two) times daily as needed.  20 tablet  0  . estradiol (VIVELLE-DOT) 0.025 MG/24HR Place 1 patch onto the skin 2 (two) times a week.  8 patch  12  . GLUCOSAMINE-VITAMIN D PO Take 1 capsule by mouth 2 (two) times daily.      . Multiple Vitamin (MULTIVITAMIN WITH MINERALS) TABS Take 1 tablet by mouth daily.      . ondansetron (ZOFRAN ODT) 4 MG disintegrating tablet Take  1 tablet (4 mg total) by mouth every 8 (eight) hours as needed for nausea.  10 tablet  0  . Risedronate Sodium (ATELVIA) 35 MG TBEC Take 1 tablet by mouth once a week. Taken on Wednesdays.       No current facility-administered medications on file prior to visit.    Allergies:  Allergies  Allergen Reactions  . Celebrex (Celecoxib) Other (See Comments)    Bleeding/bruising including bleeding for multiple days  . Latex Shortness Of Breath and Dermatitis  . Sulfa Drugs Cross Reactors Other (See Comments)    Eyes turn red, renal failure, stomach problems (vomiting, diarrhea), vasculature collapses, migraine like symptoms  . Sulfites Other (See Comments)    Eyes turn red, renal failure, stomach problems (vomiting, diarrhea), vasculature collapses, migraine like symptoms  . Epinephrine Other (See Comments)    Tachycardia  . Erythromycin Itching and Rash    Has tolerated azithromycin  . Morphine And Related Itching and Rash    Includes codeine; can take hydrocodone    ROS:  Out of a complete 14 system review of symptoms, the patient complains only of the following symptoms, and all other reviewed systems are negative.  Weight gain, fatigue  Swelling in the legs Ringing in the ears, dizziness Blurred vision, double vision, loss of vision, eye pain Shortness of breath Diarrhea, constipation Urination problems, incontinence Easy bruising Feeling hot Joint pain, muscle cramps, achy muscles Headache, numbness, weakness, tremor  Blood pressure 116/70, pulse 79, height 5\' 6"  (1.676 m), weight 182 lb (82.555 kg).  Physical Exam  General: The patient is alert and cooperative at the time of the examination. The patient is minimally obese.  Head: Pupils are equal, round, and reactive to light. Discs are flat bilaterally.  Neck: The neck is supple, no carotid bruits are noted.  Respiratory: The respiratory examination is clear.  Cardiovascular: The cardiovascular examination reveals  a regular rate and rhythm, no obvious murmurs or rubs are noted.  Skin: Extremities are without significant edema.  Neurologic Exam  Mental status:  Cranial nerves: Facial symmetry is present. There is good sensation of the face to pinprick and soft touch bilaterally, with exception of some decreased pinprick sensation on the left forehead. The strength of the facial muscles and the muscles to head turning and shoulder shrug are normal bilaterally. Speech is well enunciated, no aphasia or dysarthria is noted. Extraocular movements are full. Visual fields are full. cover test is negative.  Motor: The motor testing reveals  5 over 5 strength of all 4 extremities. the patient demonstrates giveaway weakness of the left leg.  Good symmetric motor tone is noted throughout.  Sensory: Sensory testing is intact to pinprick, soft touch, vibration sensation, and position sense on all 4 extremities, with exception of some decreased pinprick sensation of the left leg.. No evidence of extinction is noted.  Coordination: Cerebellar testing reveals good finger-nose-finger and heel-to-shin bilaterally.  Gait and station: Gait is associated with a limping type gait on the left leg. Gait is slightly unsteady. The patient is able to perform tandem gait, slightly unsteady.Romberg is negative. No drift is seen.  Reflexes: Deep tendon reflexes are symmetric and normal bilaterally. Two or 3 beats of ankle clonus is noted bilaterally.  Toes are downgoing bilaterally.   Assessment/Plan:  One. History of Behcet's disease  2. Subjective visual disturbance  3. Gait disorder  The patient presents with subjective visual complaints. The patient reports left leg weakness, but she appears to have mainly giveaway weakness of the left leg. The patient will be evaluated for possible demyelinating disease, as she has been on several medications that may increase her risk for this problem. The patient will be sent for MRI  evaluation of the brain with and without gadolinium. The patient will undergo a visual evoked response test. Cover test today was negative. The patient will followup in 3 or 4 months. Further workup may be indicated depending upon the results of the above.   Marlan Palau MD 07/14/2012 7:18 PM  [facility] [address] [place], [place] [address]  Phone [phone] Fax [phone]

## 2012-07-18 ENCOUNTER — Ambulatory Visit (INDEPENDENT_AMBULATORY_CARE_PROVIDER_SITE_OTHER): Payer: 59

## 2012-07-18 DIAGNOSIS — H5310 Unspecified subjective visual disturbances: Secondary | ICD-10-CM

## 2012-07-18 DIAGNOSIS — M352 Behcet's disease: Secondary | ICD-10-CM

## 2012-07-18 NOTE — Procedures (Signed)
History:   [name] is a 52 year old patient with a history of Behcet's disease. The patient reports problems with visual blurring, and eye pain affecting the left eye, double vision. The patient is being evaluated for an optic neuritis.  Description: The visual evoked response test was performed today using 32 x 32 check sizes. The absolute latencies for the N1 and the P100 wave forms were within normal limits bilaterally. The amplitudes for the P100 wave forms were also within normal limits bilaterally. The visual acuity was 20/30 OD and 20/50 OS corrected.  Impression:  The visual evoked response test above was within normal limits bilaterally. No evidence of conduction slowing was seen within the anterior visual pathways on either side on today's evaluation.

## 2012-07-19 NOTE — Progress Notes (Signed)
Quick Note:  I called pt and let her know results of VER. She states still having blurriness. I told her she needs to continue with other testing. He will call with results once received. Renee, contact in our office for MRI. She will call [employer] about STD needing information from [place]. (if they received). ______

## 2012-07-20 ENCOUNTER — Ambulatory Visit (INDEPENDENT_AMBULATORY_CARE_PROVIDER_SITE_OTHER): Payer: 59

## 2012-07-20 DIAGNOSIS — H5310 Unspecified subjective visual disturbances: Secondary | ICD-10-CM

## 2012-07-20 DIAGNOSIS — M352 Behcet's disease: Secondary | ICD-10-CM

## 2012-07-21 MED ORDER — GADOPENTETATE DIMEGLUMINE 469.01 MG/ML IV SOLN: 18.0000 mL | Freq: Once | INTRAVENOUS | Status: AC | PRN

## 2012-07-23 ENCOUNTER — Telehealth: Payer: Self-pay | Admitting: Neurology

## 2012-07-23 DIAGNOSIS — M79609 Pain in unspecified limb: Secondary | ICD-10-CM

## 2012-07-23 NOTE — Telephone Encounter (Signed)
I called patient. The MRI the brain shows 3 punctate lesions that are nonspecific. We will follow this over time, recheck a study in about 6 months. The patient indicates that she has problems with near vision in the left eye only, the right eye is normal. If she covers the right eye, the left eye vision is normal a distance, but blurred with near vision. The right eye is completely normal if she covers the left. The visual evoked response test was normal. There is no evidence of an optic nerve problem, and the description that the patient gives of her visual complaints appears to be associated with an intraocular problem, not a neurologic problem. The patient reports left leg weakness and pain. We will set her up for nerve conduction studies of both legs, one arm. The patient will undergo EMG evaluation of the left leg.

## 2012-07-27 ENCOUNTER — Telehealth: Payer: Self-pay | Admitting: Neurology

## 2012-07-27 NOTE — Telephone Encounter (Signed)
I called patient. We have discussed potentially sending her to an neuro-ophthalmologist, but upon further discussion, I do not think that the problem is neurologic in nature, rather intraocular. I will not make the referral. The patient will followup for EMG and nerve conduction study.

## 2012-07-27 NOTE — Telephone Encounter (Signed)
Patient requesting to know if she will be referred for a neuro ophthalmology exam per conversation w/ Dr. Anne Hahn on 07/23/12. If so, would like to be referred to [facility].

## 2012-08-03 ENCOUNTER — Ambulatory Visit
Admission: RE | Admit: 2012-08-03 | Discharge: 2012-08-03 | Disposition: A | Discharge status: Home / Self Care | Payer: 59 | Source: Ambulatory Visit

## 2012-08-03 DIAGNOSIS — Z1231 Encounter for screening mammogram for malignant neoplasm of breast: Secondary | ICD-10-CM

## 2012-08-09 ENCOUNTER — Ambulatory Visit (INDEPENDENT_AMBULATORY_CARE_PROVIDER_SITE_OTHER): Payer: 59 | Admitting: Neurology

## 2012-08-09 ENCOUNTER — Encounter (INDEPENDENT_AMBULATORY_CARE_PROVIDER_SITE_OTHER): Payer: 59 | Admitting: Radiology

## 2012-08-09 DIAGNOSIS — Z0289 Encounter for other administrative examinations: Secondary | ICD-10-CM

## 2012-08-09 DIAGNOSIS — M79609 Pain in unspecified limb: Secondary | ICD-10-CM

## 2012-08-09 NOTE — Procedures (Signed)
  HISTORY:  [name] is a 52 year old patient with a history of Behcet's disease. The patient has had prior lumbosacral spine surgery at the L5-S1 level, and she has developed some recurrent pain from the left hip down to the calf muscle on the left leg, with reports of weakness of the left leg. The patient is being evaluated for the lower extremity discomfort and weakness.  NERVE CONDUCTION STUDIES:  Nerve conduction studies were performed on the left upper extremity. The distal motor latencies and motor amplitudes for the median and ulnar nerves were within normal limits. The F wave latencies and nerve conduction velocities for these nerves were also normal. The sensory latencies for the median and ulnar nerves were normal.  Nerve conduction studies were performed on both lower extremities. The distal motor latencies and motor amplitudes for the peroneal and posterior tibial nerves were within normal limits. The nerve conduction velocities for these nerves were also normal. The H reflex latencies were normal. The sensory latencies for the peroneal nerves were within normal limits.   EMG STUDIES:  EMG study was performed on the left lower extremity:  The tibialis anterior muscle reveals 2 to 4K motor units with full recruitment. No fibrillations or positive waves were seen. The peroneus tertius muscle reveals 2 to 4K motor units with full recruitment. No fibrillations or positive waves were seen. The medial gastrocnemius muscle reveals 1 to 3K motor units with full recruitment. No fibrillations or positive waves were seen. The vastus lateralis muscle reveals 2 to 4K motor units with full recruitment. No fibrillations or positive waves were seen. The iliopsoas muscle reveals 2 to 4K motor units with full recruitment. No fibrillations or positive waves were seen. The biceps femoris muscle (long head) reveals 2 to 4K motor units with full recruitment. No fibrillations or positive waves  were seen. The lumbosacral paraspinal muscles were tested at 3 levels, and revealed no abnormalities of insertional activity at all 3 levels tested. There was good relaxation.   IMPRESSION:  Nerve conduction studies done on the left upper extremity and on both lower extremities were within normal limits. There is no evidence of a peripheral neuropathy. EMG evaluation of the left lower extremity is normal, without evidence of an acute or chronic lumbosacral radiculopathy.  Marlan Palau MD 08/09/2012 1:55 PM  [facility] [address] [place], [place] [address]  Phone [phone] Fax [phone]

## 2012-08-09 NOTE — Progress Notes (Signed)
[name] is a 52 year old patient with a history of Behcet's disease who comes in for an evaluation of left lower extremity weakness and pain. The patient has a history of lumbosacral spine surgery done in the past at the L5-S1 level. This was done by Dr. Jeral Fruit. The patient has had nerve conduction studies of both legs and the left arm that were normal. EMG of the left lower extremity was normal. Prior clinical examination suggests a giveaway weakness of the left leg, not true weakness. The patient remains on disability. The patient has also undergone visual evoked response test for visual blurring of the left eye. These studies were unremarkable. MRI the brain has shown 3 punctate nonspecific lesions , not consistent with multiple sclerosis. This will be followed over time.  The patient continues to complain of the left leg pain, and some problems with the left leg giving away, causing falls. MRI of the lumbosacral spine will be done. The patient will followup if needed.

## 2012-09-13 DIAGNOSIS — M545 Low back pain, unspecified: Secondary | ICD-10-CM | POA: Insufficient documentation

## 2012-09-15 ENCOUNTER — Ambulatory Visit: Payer: 59

## 2012-09-21 ENCOUNTER — Ambulatory Visit (HOSPITAL_COMMUNITY)
Admission: RE | Admit: 2012-09-21 | Discharge: 2012-09-21 | Disposition: A | Discharge status: Home / Self Care | Payer: 59 | Source: Ambulatory Visit | Attending: Neurology | Admitting: Neurology

## 2012-09-21 DIAGNOSIS — M129 Arthropathy, unspecified: Secondary | ICD-10-CM | POA: Insufficient documentation

## 2012-09-21 DIAGNOSIS — N281 Cyst of kidney, acquired: Secondary | ICD-10-CM | POA: Insufficient documentation

## 2012-09-21 DIAGNOSIS — M5126 Other intervertebral disc displacement, lumbar region: Secondary | ICD-10-CM | POA: Insufficient documentation

## 2012-09-21 DIAGNOSIS — M6250 Muscle wasting and atrophy, not elsewhere classified, unspecified site: Secondary | ICD-10-CM | POA: Insufficient documentation

## 2012-09-21 DIAGNOSIS — M79609 Pain in unspecified limb: Secondary | ICD-10-CM

## 2012-09-21 MED ORDER — GADOBENATE DIMEGLUMINE 529 MG/ML IV SOLN
16.0000 mL | Freq: Once | INTRAVENOUS | Status: AC | PRN
  Administered 2012-09-21: 16 mL via INTRAVENOUS

## 2012-09-22 ENCOUNTER — Telehealth: Payer: Self-pay | Admitting: Neurology

## 2012-09-22 NOTE — Telephone Encounter (Signed)
I have called her, MRI lumbar showed Status post interval left hemilaminectomy with expected postoperative changes.  Degenerative change of the lower lumbar spine without canal stenosis. Neural foraminal narrowing L3-4 through L5- S1: Moderate to severe  on the left at L5 S1.  She has pain at left hip, left thigh, left calf, left calf muscle tightness  EMG/NCS was normal.  She has been symptomatic since her fall in Jan 2015.  She had history of epidural injection in the past, which was not helpful, she is also worried about the potential side effect.  She will be seen by Dr. Sterling Big at [facility]. I faxed the MRI lumbar result

## 2012-11-08 ENCOUNTER — Other Ambulatory Visit (HOSPITAL_COMMUNITY): Payer: Self-pay | Admitting: Sports Medicine

## 2012-11-08 DIAGNOSIS — M25561 Pain in right knee: Secondary | ICD-10-CM

## 2012-11-10 ENCOUNTER — Other Ambulatory Visit: Payer: Self-pay

## 2012-11-15 ENCOUNTER — Ambulatory Visit (HOSPITAL_COMMUNITY)
Admission: RE | Admit: 2012-11-15 | Discharge: 2012-11-15 | Disposition: A | Discharge status: Home / Self Care | Payer: 59 | Source: Ambulatory Visit | Attending: Sports Medicine | Admitting: Sports Medicine

## 2012-11-15 DIAGNOSIS — M25561 Pain in right knee: Secondary | ICD-10-CM

## 2012-11-15 DIAGNOSIS — M25569 Pain in unspecified knee: Secondary | ICD-10-CM | POA: Diagnosis present

## 2012-11-15 DIAGNOSIS — X58XXXA Exposure to other specified factors, initial encounter: Secondary | ICD-10-CM | POA: Insufficient documentation

## 2012-11-15 DIAGNOSIS — S83289A Other tear of lateral meniscus, current injury, unspecified knee, initial encounter: Secondary | ICD-10-CM | POA: Insufficient documentation

## 2012-11-15 DIAGNOSIS — M2390 Unspecified internal derangement of unspecified knee: Secondary | ICD-10-CM | POA: Insufficient documentation

## 2012-12-12 ENCOUNTER — Encounter: Payer: Self-pay | Admitting: Physician Assistant

## 2012-12-12 ENCOUNTER — Encounter (HOSPITAL_BASED_OUTPATIENT_CLINIC_OR_DEPARTMENT_OTHER): Payer: 59 | Admitting: Anesthesiology

## 2012-12-12 ENCOUNTER — Ambulatory Visit (HOSPITAL_BASED_OUTPATIENT_CLINIC_OR_DEPARTMENT_OTHER)
Admission: RE | Admit: 2012-12-12 | Discharge: 2012-12-12 | Disposition: A | Discharge status: Home / Self Care | Payer: 59 | Source: Ambulatory Visit | Attending: Orthopedic Surgery | Admitting: Orthopedic Surgery

## 2012-12-12 ENCOUNTER — Encounter (HOSPITAL_BASED_OUTPATIENT_CLINIC_OR_DEPARTMENT_OTHER)
Admission: RE | Disposition: A | Discharge status: Home / Self Care | Payer: Self-pay | Source: Ambulatory Visit | Attending: Orthopedic Surgery

## 2012-12-12 ENCOUNTER — Ambulatory Visit (HOSPITAL_BASED_OUTPATIENT_CLINIC_OR_DEPARTMENT_OTHER): Payer: 59 | Admitting: Anesthesiology

## 2012-12-12 ENCOUNTER — Encounter (HOSPITAL_BASED_OUTPATIENT_CLINIC_OR_DEPARTMENT_OTHER): Payer: Self-pay | Admitting: Anesthesiology

## 2012-12-12 ENCOUNTER — Other Ambulatory Visit: Payer: Self-pay | Admitting: Physician Assistant

## 2012-12-12 DIAGNOSIS — S83281A Other tear of lateral meniscus, current injury, right knee, initial encounter: Secondary | ICD-10-CM

## 2012-12-12 DIAGNOSIS — R7302 Impaired glucose tolerance (oral): Secondary | ICD-10-CM | POA: Diagnosis present

## 2012-12-12 DIAGNOSIS — E119 Type 2 diabetes mellitus without complications: Secondary | ICD-10-CM | POA: Insufficient documentation

## 2012-12-12 DIAGNOSIS — K589 Irritable bowel syndrome without diarrhea: Secondary | ICD-10-CM | POA: Insufficient documentation

## 2012-12-12 DIAGNOSIS — M545 Low back pain, unspecified: Secondary | ICD-10-CM | POA: Diagnosis present

## 2012-12-12 DIAGNOSIS — M23359 Other meniscus derangements, posterior horn of lateral meniscus, unspecified knee: Secondary | ICD-10-CM | POA: Insufficient documentation

## 2012-12-12 DIAGNOSIS — K219 Gastro-esophageal reflux disease without esophagitis: Secondary | ICD-10-CM | POA: Insufficient documentation

## 2012-12-12 DIAGNOSIS — K529 Noninfective gastroenteritis and colitis, unspecified: Secondary | ICD-10-CM | POA: Diagnosis present

## 2012-12-12 DIAGNOSIS — N809 Endometriosis, unspecified: Secondary | ICD-10-CM | POA: Insufficient documentation

## 2012-12-12 DIAGNOSIS — M8580 Other specified disorders of bone density and structure, unspecified site: Secondary | ICD-10-CM | POA: Insufficient documentation

## 2012-12-12 DIAGNOSIS — Z79899 Other long term (current) drug therapy: Secondary | ICD-10-CM | POA: Insufficient documentation

## 2012-12-12 DIAGNOSIS — J45909 Unspecified asthma, uncomplicated: Secondary | ICD-10-CM | POA: Insufficient documentation

## 2012-12-12 DIAGNOSIS — H5310 Unspecified subjective visual disturbances: Secondary | ICD-10-CM | POA: Diagnosis present

## 2012-12-12 DIAGNOSIS — M23329 Other meniscus derangements, posterior horn of medial meniscus, unspecified knee: Secondary | ICD-10-CM | POA: Insufficient documentation

## 2012-12-12 DIAGNOSIS — M2240 Chondromalacia patellae, unspecified knee: Secondary | ICD-10-CM | POA: Insufficient documentation

## 2012-12-12 DIAGNOSIS — M352 Behcet's disease: Secondary | ICD-10-CM | POA: Diagnosis present

## 2012-12-12 DIAGNOSIS — M24469 Recurrent dislocation, unspecified knee: Secondary | ICD-10-CM | POA: Insufficient documentation

## 2012-12-12 DIAGNOSIS — E2749 Other adrenocortical insufficiency: Secondary | ICD-10-CM | POA: Insufficient documentation

## 2012-12-12 DIAGNOSIS — IMO0002 Reserved for concepts with insufficient information to code with codable children: Secondary | ICD-10-CM | POA: Insufficient documentation

## 2012-12-12 DIAGNOSIS — M899 Disorder of bone, unspecified: Secondary | ICD-10-CM | POA: Insufficient documentation

## 2012-12-12 HISTORY — DX: Unspecified asthma, uncomplicated: J45.909

## 2012-12-12 HISTORY — PX: KNEE ARTHROSCOPY WITH LATERAL MENISECTOMY: SHX6193

## 2012-12-12 LAB — POCT HEMOGLOBIN-HEMACUE: Hemoglobin: 12.6 g/dL (ref 12.0–15.0)

## 2012-12-12 SURGERY — ARTHROSCOPY, KNEE, WITH LATERAL MENISCECTOMY
Anesthesia: General | Site: Knee | Laterality: Right

## 2012-12-12 MED ORDER — BUPIVACAINE-EPINEPHRINE PF 0.25-1:200000 % IJ SOLN
INTRAMUSCULAR | Status: AC
  Filled 2012-12-12: qty 30

## 2012-12-12 MED ORDER — ONDANSETRON HCL 4 MG/2ML IJ SOLN
INTRAMUSCULAR | Status: DC | PRN
  Administered 2012-12-12: 4 mg via INTRAVENOUS

## 2012-12-12 MED ORDER — OXYCODONE HCL 5 MG/5ML PO SOLN: 5.0000 mg | Freq: Once | ORAL | Status: DC | PRN

## 2012-12-12 MED ORDER — HYDROMORPHONE HCL PF 1 MG/ML IJ SOLN
INTRAMUSCULAR | Status: AC
  Filled 2012-12-12: qty 1

## 2012-12-12 MED ORDER — MIDAZOLAM HCL 2 MG/2ML IJ SOLN
INTRAMUSCULAR | Status: AC
  Filled 2012-12-12: qty 2

## 2012-12-12 MED ORDER — HYDROCODONE-ACETAMINOPHEN 10-325 MG PO TABS: ORAL_TABLET | ORAL | Status: DC

## 2012-12-12 MED ORDER — LIDOCAINE HCL (CARDIAC) 20 MG/ML IV SOLN
INTRAVENOUS | Status: DC | PRN
  Administered 2012-12-12: 50 mg via INTRAVENOUS

## 2012-12-12 MED ORDER — CEFAZOLIN SODIUM-DEXTROSE 2-3 GM-% IV SOLR
2.0000 g | INTRAVENOUS | Status: AC
  Administered 2012-12-12: 2 g via INTRAVENOUS

## 2012-12-12 MED ORDER — EPINEPHRINE HCL 1 MG/ML IJ SOLN
INTRAMUSCULAR | Status: AC
  Filled 2012-12-12: qty 1

## 2012-12-12 MED ORDER — FENTANYL CITRATE 0.05 MG/ML IJ SOLN
INTRAMUSCULAR | Status: AC
  Filled 2012-12-12: qty 2

## 2012-12-12 MED ORDER — FENTANYL CITRATE 0.05 MG/ML IJ SOLN
INTRAMUSCULAR | Status: AC
  Filled 2012-12-12: qty 4

## 2012-12-12 MED ORDER — FENTANYL CITRATE 0.05 MG/ML IJ SOLN
50.0000 ug | INTRAMUSCULAR | Status: DC | PRN
  Administered 2012-12-12: 100 ug via INTRAVENOUS

## 2012-12-12 MED ORDER — DEXAMETHASONE SODIUM PHOSPHATE 4 MG/ML IJ SOLN
INTRAMUSCULAR | Status: DC | PRN
  Administered 2012-12-12: 10 mg via INTRAVENOUS

## 2012-12-12 MED ORDER — FENTANYL CITRATE 0.05 MG/ML IJ SOLN
INTRAMUSCULAR | Status: DC | PRN
  Administered 2012-12-12: 100 ug via INTRAVENOUS

## 2012-12-12 MED ORDER — CHLORHEXIDINE GLUCONATE 4 % EX LIQD: 60.0000 mL | Freq: Once | CUTANEOUS | Status: DC

## 2012-12-12 MED ORDER — LACTATED RINGERS IV SOLN
INTRAVENOUS | Status: DC
  Administered 2012-12-12: 11:00:00 via INTRAVENOUS

## 2012-12-12 MED ORDER — METOCLOPRAMIDE HCL 5 MG/ML IJ SOLN: 10.0000 mg | Freq: Once | INTRAMUSCULAR | Status: DC | PRN

## 2012-12-12 MED ORDER — EPINEPHRINE HCL 1 MG/ML IJ SOLN
INTRAMUSCULAR | Status: DC | PRN
  Administered 2012-12-12: 1 mg

## 2012-12-12 MED ORDER — CEFAZOLIN SODIUM-DEXTROSE 2-3 GM-% IV SOLR
INTRAVENOUS | Status: AC
  Filled 2012-12-12: qty 50

## 2012-12-12 MED ORDER — PROPOFOL 10 MG/ML IV BOLUS
INTRAVENOUS | Status: DC | PRN
  Administered 2012-12-12: 200 mg via INTRAVENOUS

## 2012-12-12 MED ORDER — HYDROMORPHONE HCL PF 1 MG/ML IJ SOLN
0.2500 mg | INTRAMUSCULAR | Status: DC | PRN
  Administered 2012-12-12: 0.5 mg via INTRAVENOUS

## 2012-12-12 MED ORDER — MIDAZOLAM HCL 2 MG/2ML IJ SOLN
1.0000 mg | INTRAMUSCULAR | Status: DC | PRN
Start: 2012-12-12 — End: 2012-12-12
  Administered 2012-12-12: 2 mg via INTRAVENOUS

## 2012-12-12 MED ORDER — SODIUM CHLORIDE 0.9 % IR SOLN
Status: DC | PRN
  Administered 2012-12-12: 5000 mL

## 2012-12-12 MED ORDER — OXYCODONE HCL 5 MG PO TABS: 5.0000 mg | ORAL_TABLET | Freq: Once | ORAL | Status: DC | PRN

## 2012-12-12 MED ORDER — METOCLOPRAMIDE HCL 5 MG/ML IJ SOLN
INTRAMUSCULAR | Status: DC | PRN
  Administered 2012-12-12: 10 mg via INTRAVENOUS

## 2012-12-12 SURGICAL SUPPLY — 42 items
BANDAGE ELASTIC 6 VELCRO ST LF (GAUZE/BANDAGES/DRESSINGS) ×2 IMPLANT
BLADE CUTTER GATOR 3.5 (BLADE) ×1 IMPLANT
BLADE GREAT WHITE 4.2 (BLADE) ×1 IMPLANT
BLADE SURG 15 STRL LF DISP TIS (BLADE) IMPLANT
BLADE SURG 15 STRL SS (BLADE)
BNDG COHESIVE 4X5 TAN STRL (GAUZE/BANDAGES/DRESSINGS) IMPLANT
CANISTER SUCT 3000ML (MISCELLANEOUS) IMPLANT
CANISTER SUCT LVC 12 LTR MEDI- (MISCELLANEOUS) IMPLANT
DRAPE ARTHROSCOPY W/POUCH 90 (DRAPES) ×2 IMPLANT
DURAPREP 26ML APPLICATOR (WOUND CARE) ×2 IMPLANT
GAUZE XEROFORM 1X8 LF (GAUZE/BANDAGES/DRESSINGS) ×2 IMPLANT
GLOVE BIO SURGEON STRL SZ7 (GLOVE) ×2 IMPLANT
GLOVE BIOGEL PI IND STRL 7.0 (GLOVE) ×1 IMPLANT
GLOVE BIOGEL PI IND STRL 7.5 (GLOVE) ×1 IMPLANT
GLOVE BIOGEL PI INDICATOR 7.0 (GLOVE) ×1
GLOVE BIOGEL PI INDICATOR 7.5 (GLOVE) ×1
GLOVE SS BIOGEL STRL SZ 7.5 (GLOVE) ×1 IMPLANT
GLOVE SUPERSENSE BIOGEL SZ 7.5 (GLOVE) ×1
GLOVE SURG SS PI 7.0 STRL IVOR (GLOVE) ×1 IMPLANT
GLOVE SURG SS PI 7.5 STRL IVOR (GLOVE) ×2 IMPLANT
GOWN PREVENTION PLUS XLARGE (GOWN DISPOSABLE) ×2 IMPLANT
HOLDER KNEE FOAM BLUE (MISCELLANEOUS) ×2 IMPLANT
KNEE WRAP E Z 3 GEL PACK (MISCELLANEOUS) ×1 IMPLANT
NDL SAFETY ECLIPSE 18X1.5 (NEEDLE) ×2 IMPLANT
NEEDLE HYPO 18GX1.5 SHARP (NEEDLE) ×4
NEEDLE HYPO 22GX1.5 SAFETY (NEEDLE) IMPLANT
PACK ARTHROSCOPY DSU (CUSTOM PROCEDURE TRAY) ×2 IMPLANT
PACK BASIN DAY SURGERY FS (CUSTOM PROCEDURE TRAY) ×2 IMPLANT
PAD ALCOHOL SWAB (MISCELLANEOUS) IMPLANT
SET ARTHROSCOPY TUBING (MISCELLANEOUS) ×2
SET ARTHROSCOPY TUBING LN (MISCELLANEOUS) ×1 IMPLANT
SPONGE GAUZE 4X4 12PLY (GAUZE/BANDAGES/DRESSINGS) ×2 IMPLANT
SUCTION FRAZIER TIP 10 FR DISP (SUCTIONS) IMPLANT
SUT ETHILON 4 0 PS 2 18 (SUTURE) ×2 IMPLANT
SUT PROLENE 3 0 PS 2 (SUTURE) IMPLANT
SUT VIC AB 3-0 PS1 18 (SUTURE)
SUT VIC AB 3-0 PS1 18XBRD (SUTURE) IMPLANT
SYR 20CC LL (SYRINGE) IMPLANT
SYR 5ML LL (SYRINGE) ×2 IMPLANT
TOWEL OR 17X24 6PK STRL BLUE (TOWEL DISPOSABLE) ×2 IMPLANT
WAND STAR VAC 90 (SURGICAL WAND) ×1 IMPLANT
WATER STERILE IRR 1000ML POUR (IV SOLUTION) ×2 IMPLANT

## 2012-12-12 NOTE — H&P (Signed)
[name] is an 52 y.o. female.   Chief Complaint: right knee pain HPI: 52 yof with a history of progressively worsing right knee pain that has failed oral medication, intra-articular injections.  She is have progressive disability especially with stairs and bending and squatting.  MRI reveals lateral meniscus tear and severe patella femoral DJD.  Past Medical History  Diagnosis Date  . Endometriosis   . Endometrial polyp   . IBS (irritable bowel syndrome)   . Acid reflux   . Colitis   . Behcet's syndrome   . GERD (gastroesophageal reflux disease)   . Uveitis   . Osteopenia   . Diabetes mellitus type II, controlled   . Adrenal insufficiency   . Subjective visual disturbance 07/14/2012  . Tear of lateral meniscus of right knee, current     Past Surgical History  Procedure Laterality Date  . Cholecystectomy  12/2004  . Pelvic laparoscopy      DL  . Dilation and curettage of uterus    . Hysteroscopy    . Cesarean section  03/2001  . Knee surgery Left 05/1998  . Hand surgery    . Breast surgery      Cysts excised  . Oophorectomy      BSO  . Vaginal hysterectomy  07/2007    [facility] BSO  . Back surgery      Family History  Problem Relation Age of Onset  . Diabetes Mother   . Breast cancer Mother     Age 60's  . Stroke Mother   . Gastric cancer Father   . Diabetes Sister   . Hypertension Sister   . Breast cancer Sister     Age 28   Social History:  reports that she has never smoked. She has never used smokeless tobacco. She reports that she does not drink alcohol or use illicit drugs.  Allergies:  Allergies  Allergen Reactions  . Celebrex [Celecoxib] Other (See Comments)    Bleeding/bruising including bleeding for multiple days  . Latex Shortness Of Breath and Dermatitis  . Sulfa Drugs Cross Reactors Other (See Comments)    Eyes turn red, renal failure, stomach problems (vomiting, diarrhea), vasculature collapses, migraine like symptoms  . Sulfites Other (See  Comments)    Eyes turn red, renal failure, stomach problems (vomiting, diarrhea), vasculature collapses, migraine like symptoms  . Epinephrine Other (See Comments)    Tachycardia  . Erythromycin Itching and Rash    Has tolerated azithromycin  . Morphine And Related Itching and Rash    Includes codeine; can take hydrocodone   Current Outpatient Prescriptions on File Prior to Visit  Medication Sig Dispense Refill  . acetaminophen (TYLENOL) 500 MG tablet Take 1 tablet (500 mg total) by mouth every 6 (six) hours as needed for pain.      . azaTHIOprine (IMURAN) 50 MG tablet Take 2 tablets (100 mg total) by mouth 2 (two) times daily.      . Calcium-Magnesium-Zinc 333-133-5 MG TABS Take 1 tablet by mouth 2 (two) times daily.       . cetirizine (ZYRTEC) 10 MG tablet Take 10 mg by mouth at bedtime.      . Coconut Oil 1000 MG CAPS Take 1,000 mg by mouth 2 (two) times daily.      . colchicine 0.6 MG tablet Take 1 tablet (0.6 mg total) by mouth 2 (two) times daily.      . dicyclomine (BENTYL) 20 MG tablet Take 1 tablet (20 mg total) by   mouth 2 (two) times daily as needed.  20 tablet  0  . estradiol (VIVELLE-DOT) 0.025 MG/24HR Place 1 patch onto the skin 2 (two) times a week.  8 patch  12  . GLUCOSAMINE-VITAMIN D PO Take 1 capsule by mouth 2 (two) times daily.      . HYDROcodone-acetaminophen (NORCO/VICODIN) 5-325 MG per tablet Take 1 tablet by mouth every 6 (six) hours as needed for pain.      . lansoprazole (PREVACID) 30 MG capsule Take 30 mg by mouth daily.      . Multiple Vitamin (MULTIVITAMIN WITH MINERALS) TABS Take 1 tablet by mouth daily.      . ondansetron (ZOFRAN ODT) 4 MG disintegrating tablet Take 1 tablet (4 mg total) by mouth every 8 (eight) hours as needed for nausea.  10 tablet  0  . oxybutynin (DITROPAN) 5 MG tablet Take 5 mg by mouth 3 (three) times daily.      . predniSONE (DELTASONE) 10 MG tablet Take 10 mg by mouth daily.      . Risedronate Sodium (ATELVIA) 35 MG TBEC Take 1 tablet  by mouth once a week. Taken on Wednesdays.       No current facility-administered medications on file prior to visit.     (Not in a hospital admission)  No results found for this or any previous visit (from the past 48 hour(s)). No results found.  Review of Systems  Constitutional: Negative.   HENT: Negative.   Eyes: Negative.   Respiratory: Negative.   Cardiovascular: Negative.   Gastrointestinal: Negative.   Genitourinary: Negative.   Musculoskeletal: Positive for back pain and joint pain.       Right knee  Skin: Negative.   Neurological: Negative.   Endo/Heme/Allergies: Negative.   Psychiatric/Behavioral: Negative.     Blood pressure 169/77, height 5' 5" (1.651 m), weight 77.111 kg (170 lb). Physical Exam  Constitutional: She is oriented to person, place, and time. She appears well-developed and well-nourished.  HENT:  Head: Normocephalic and atraumatic.  Eyes: Conjunctivae and EOM are normal. Pupils are equal, round, and reactive to light.  Neck: Normal range of motion. Neck supple.  Cardiovascular: Normal rate, regular rhythm and normal heart sounds.   Respiratory: Effort normal and breath sounds normal.  GI: Soft.  Genitourinary:  Not pertinent to current symptomatology therefore not examined.  Musculoskeletal: She exhibits edema and tenderness.  Examination of her right knee reveals pain medially and laterally, pain on the lateral joint line with a 1+ effusion.  ROM 0-125. Knee is stable with normal patella tracking with retropatella pain.  Exam of left knee reveals full range of motion with minimal pain swelling.  No instability.  2+ DP pulses  Neurological: She is alert and oriented to person, place, and time.  Skin: Skin is warm and dry.  Psychiatric: She has a normal mood and affect. Her behavior is normal.     Assessment Patient Active Problem List   Diagnosis Date Noted  . Tear of lateral meniscus of right knee, current   . Osteopenia   . Lumbar back  pain 09/13/2012  . Subjective visual disturbance 07/14/2012  . Endometriosis   . Endometrial polyp   . IBS (irritable bowel syndrome)   . Acid reflux   . Colitis   . Behcet's syndrome   . Glucose intolerance (impaired glucose tolerance) 12/19/2010  . Behcet's disease 01/06/2004     Plan Right knee arthroscopy with lateral meniscectomy.  The risks, benefits, and possible complications of   the procedure were discussed in detail with the patient.  The patient is without question.  Yosselyn Tax J 12/12/2012, 9:22 AM    

## 2012-12-12 NOTE — Anesthesia Preprocedure Evaluation (Signed)
Anesthesia Evaluation  Patient identified by MRN, date of birth, ID band Patient awake    Reviewed: Allergy & Precautions, H&P , NPO status , Patient's Chart, lab work & pertinent test results, reviewed documented beta blocker date and time   Airway Mallampati: II TM Distance: >3 FB Neck ROM: full    Dental   Pulmonary asthma ,  breath sounds clear to auscultation        Cardiovascular negative cardio ROS  Rhythm:regular     Neuro/Psych negative neurological ROS  negative psych ROS   GI/Hepatic Neg liver ROS, GERD-  Medicated and Controlled,  Endo/Other  diabetes  Renal/GU negative Renal ROS  negative genitourinary   Musculoskeletal   Abdominal   Peds  Hematology negative hematology ROS (+)   Anesthesia Other Findings See surgeon's H&P   Reproductive/Obstetrics negative OB ROS                           Anesthesia Physical Anesthesia Plan  ASA: II  Anesthesia Plan: General   Post-op Pain Management:    Induction: Intravenous  Airway Management Planned: LMA  Additional Equipment:   Intra-op Plan:   Post-operative Plan:   Informed Consent: I have reviewed the patients History and Physical, chart, labs and discussed the procedure including the risks, benefits and alternatives for the proposed anesthesia with the patient or authorized representative who has indicated his/her understanding and acceptance.   Dental Advisory Given  Plan Discussed with: CRNA and Surgeon  Anesthesia Plan Comments:         Anesthesia Quick Evaluation

## 2012-12-12 NOTE — Anesthesia Procedure Notes (Signed)
Procedure Name: LMA Insertion Performed by: Alma Mohiuddin W Pre-anesthesia Checklist: Patient identified, Timeout performed, Emergency Drugs available, Suction available and Patient being monitored Patient Re-evaluated:Patient Re-evaluated prior to inductionOxygen Delivery Method: Circle system utilized Preoxygenation: Pre-oxygenation with 100% oxygen Intubation Type: IV induction Ventilation: Mask ventilation without difficulty LMA: LMA inserted LMA Size: 4.0 Number of attempts: 1 Placement Confirmation: breath sounds checked- equal and bilateral and positive ETCO2 Tube secured with: Tape Dental Injury: Teeth and Oropharynx as per pre-operative assessment      

## 2012-12-12 NOTE — H&P (View-Only) (Signed)
[name] is an 52 y.o. female.   Chief Complaint: right knee pain HPI: 15 yof with a history of progressively worsing right knee pain that has failed oral medication, intra-articular injections.  She is have progressive disability especially with stairs and bending and squatting.  MRI reveals lateral meniscus tear and severe patella femoral DJD.  Past Medical History  Diagnosis Date  . Endometriosis   . Endometrial polyp   . IBS (irritable bowel syndrome)   . Acid reflux   . Colitis   . Behcet's syndrome   . GERD (gastroesophageal reflux disease)   . Uveitis   . Osteopenia   . Diabetes mellitus type II, controlled   . Adrenal insufficiency   . Subjective visual disturbance 07/14/2012  . Tear of lateral meniscus of right knee, current     Past Surgical History  Procedure Laterality Date  . Cholecystectomy  12/2004  . Pelvic laparoscopy      DL  . Dilation and curettage of uterus    . Hysteroscopy    . Cesarean section  03/2001  . Knee surgery Left 05/1998  . Hand surgery    . Breast surgery      Cysts excised  . Oophorectomy      BSO  . Vaginal hysterectomy  07/2007    [facility] BSO  . Back surgery      Family History  Problem Relation Age of Onset  . Diabetes Mother   . Breast cancer Mother     Age 56's  . Stroke Mother   . Gastric cancer Father   . Diabetes Sister   . Hypertension Sister   . Breast cancer Sister     Age 24   Social History:  reports that she has never smoked. She has never used smokeless tobacco. She reports that she does not drink alcohol or use illicit drugs.  Allergies:  Allergies  Allergen Reactions  . Celebrex [Celecoxib] Other (See Comments)    Bleeding/bruising including bleeding for multiple days  . Latex Shortness Of Breath and Dermatitis  . Sulfa Drugs Cross Reactors Other (See Comments)    Eyes turn red, renal failure, stomach problems (vomiting, diarrhea), vasculature collapses, migraine like symptoms  . Sulfites Other (See  Comments)    Eyes turn red, renal failure, stomach problems (vomiting, diarrhea), vasculature collapses, migraine like symptoms  . Epinephrine Other (See Comments)    Tachycardia  . Erythromycin Itching and Rash    Has tolerated azithromycin  . Morphine And Related Itching and Rash    Includes codeine; can take hydrocodone   Current Outpatient Prescriptions on File Prior to Visit  Medication Sig Dispense Refill  . acetaminophen (TYLENOL) 500 MG tablet Take 1 tablet (500 mg total) by mouth every 6 (six) hours as needed for pain.      Marland Kitchen azaTHIOprine (IMURAN) 50 MG tablet Take 2 tablets (100 mg total) by mouth 2 (two) times daily.      . Calcium-Magnesium-Zinc 333-133-5 MG TABS Take 1 tablet by mouth 2 (two) times daily.       . cetirizine (ZYRTEC) 10 MG tablet Take 10 mg by mouth at bedtime.      . Coconut Oil 1000 MG CAPS Take 1,000 mg by mouth 2 (two) times daily.      . colchicine 0.6 MG tablet Take 1 tablet (0.6 mg total) by mouth 2 (two) times daily.      Marland Kitchen dicyclomine (BENTYL) 20 MG tablet Take 1 tablet (20 mg total) by  mouth 2 (two) times daily as needed.  20 tablet  0  . estradiol (VIVELLE-DOT) 0.025 MG/24HR Place 1 patch onto the skin 2 (two) times a week.  8 patch  12  . GLUCOSAMINE-VITAMIN D PO Take 1 capsule by mouth 2 (two) times daily.      Marland Kitchen HYDROcodone-acetaminophen (NORCO/VICODIN) 5-325 MG per tablet Take 1 tablet by mouth every 6 (six) hours as needed for pain.      Marland Kitchen lansoprazole (PREVACID) 30 MG capsule Take 30 mg by mouth daily.      . Multiple Vitamin (MULTIVITAMIN WITH MINERALS) TABS Take 1 tablet by mouth daily.      . ondansetron (ZOFRAN ODT) 4 MG disintegrating tablet Take 1 tablet (4 mg total) by mouth every 8 (eight) hours as needed for nausea.  10 tablet  0  . oxybutynin (DITROPAN) 5 MG tablet Take 5 mg by mouth 3 (three) times daily.      . predniSONE (DELTASONE) 10 MG tablet Take 10 mg by mouth daily.      . Risedronate Sodium (ATELVIA) 35 MG TBEC Take 1 tablet  by mouth once a week. Taken on Wednesdays.       No current facility-administered medications on file prior to visit.     (Not in a hospital admission)  No results found for this or any previous visit (from the past 48 hour(s)). No results found.  Review of Systems  Constitutional: Negative.   HENT: Negative.   Eyes: Negative.   Respiratory: Negative.   Cardiovascular: Negative.   Gastrointestinal: Negative.   Genitourinary: Negative.   Musculoskeletal: Positive for back pain and joint pain.       Right knee  Skin: Negative.   Neurological: Negative.   Endo/Heme/Allergies: Negative.   Psychiatric/Behavioral: Negative.     Blood pressure 169/77, height 5\' 5"  (1.651 m), weight 77.111 kg (170 lb). Physical Exam  Constitutional: She is oriented to person, place, and time. She appears well-developed and well-nourished.  HENT:  Head: Normocephalic and atraumatic.  Eyes: Conjunctivae and EOM are normal. Pupils are equal, round, and reactive to light.  Neck: Normal range of motion. Neck supple.  Cardiovascular: Normal rate, regular rhythm and normal heart sounds.   Respiratory: Effort normal and breath sounds normal.  GI: Soft.  Genitourinary:  Not pertinent to current symptomatology therefore not examined.  Musculoskeletal: She exhibits edema and tenderness.  Examination of her right knee reveals pain medially and laterally, pain on the lateral joint line with a 1+ effusion.  ROM 0-125. Knee is stable with normal patella tracking with retropatella pain.  Exam of left knee reveals full range of motion with minimal pain swelling.  No instability.  2+ DP pulses  Neurological: She is alert and oriented to person, place, and time.  Skin: Skin is warm and dry.  Psychiatric: She has a normal mood and affect. Her behavior is normal.     Assessment Patient Active Problem List   Diagnosis Date Noted  . Tear of lateral meniscus of right knee, current   . Osteopenia   . Lumbar back  pain 09/13/2012  . Subjective visual disturbance 07/14/2012  . Endometriosis   . Endometrial polyp   . IBS (irritable bowel syndrome)   . Acid reflux   . Colitis   . Behcet's syndrome   . Glucose intolerance (impaired glucose tolerance) 12/19/2010  . Behcet's disease 01/06/2004     Plan Right knee arthroscopy with lateral meniscectomy.  The risks, benefits, and possible complications of  the procedure were discussed in detail with the patient.  The patient is without question.  Edona Schreffler J 12/12/2012, 9:22 AM

## 2012-12-12 NOTE — Transfer of Care (Signed)
Immediate Anesthesia Transfer of Care Note  Patient: [name]  Procedure(s) Performed: Procedure(s): KNEE ARTHROSCOPY WITH LATERAL MENISECTOMY (Right)  Patient Location: PACU  Anesthesia Type:General  Level of Consciousness: awake and sedated  Airway & Oxygen Therapy: Patient Spontanous Breathing and Patient connected to face mask oxygen  Post-op Assessment: Report given to PACU RN and Post -op Vital signs reviewed and stable  Post vital signs: Reviewed and stable  Complications: No apparent anesthesia complications

## 2012-12-12 NOTE — Interval H&P Note (Signed)
History and Physical Interval Note:  12/12/2012 10:34 AM  [name]  has presented today for surgery, with the diagnosis of lateral miniscus tear  The various methods of treatment have been discussed with the patient and family. After consideration of risks, benefits and other options for treatment, the patient has consented to  Procedure(s): KNEE ARTHROSCOPY WITH LATERAL MENISECTOMY (Right) as a surgical intervention .  The patient's history has been reviewed, patient examined, no change in status, stable for surgery.  I have reviewed the patient's chart and labs.  Questions were answered to the patient's satisfaction.     Salvatore Marvel A

## 2012-12-12 NOTE — Progress Notes (Signed)
Assisted Dr. Frederick with right, knee block. Side rails up, monitors on throughout procedure. See vital signs in flow sheet. Tolerated Procedure well. 

## 2012-12-13 NOTE — Anesthesia Postprocedure Evaluation (Signed)
Anesthesia Post Note  Patient: [name]  Procedure(s) Performed: Procedure(s) (LRB): KNEE ARTHROSCOPY WITH LATERAL MENISECTOMY (Right)  Anesthesia type: General  Patient location: PACU  Post pain: Pain level controlled  Post assessment: Patient's Cardiovascular Status Stable  Last Vitals:  Filed Vitals:   12/12/12 1350  BP: 112/70  Pulse:   Temp:   Resp: 18    Post vital signs: Reviewed and stable  Level of consciousness: alert  Complications: No apparent anesthesia complications

## 2012-12-14 ENCOUNTER — Encounter (HOSPITAL_BASED_OUTPATIENT_CLINIC_OR_DEPARTMENT_OTHER): Payer: Self-pay | Admitting: Orthopedic Surgery

## 2012-12-15 NOTE — Op Note (Signed)
[name], [name]           ACCOUNT NO.:  [id number]  MEDICAL RECORD NO.:  [id number]  LOCATION:                               FACILITY:  [facility]  PHYSICIAN:  Kaelan Emami A. Thurston Hole, M.D. DATE OF BIRTH:  1960/02/25  DATE OF PROCEDURE:  12/12/2012 DATE OF DISCHARGE:  12/12/2012                              OPERATIVE REPORT   PREOPERATIVE DIAGNOSES: 1. Right knee medial and lateral meniscal tears. 2. Right knee patellofemoral grade 4 chondromalacia with lateral     patella subluxation.  POSTOPERATIVE DIAGNOSES: 1. Right knee medial and lateral meniscal tears. 2. Right knee patellofemoral grade 4 chondromalacia with lateral     patella subluxation.  PROCEDURE: 1. Right knee EUA followed by arthroscopic partial medial and lateral     meniscectomies. 2. Right knee patellofemoral chondroplasty. 3. Right knee lateral retinacular release.  SURGEON:  Elana Alm. Thurston Hole, M.D.  ANESTHESIA:  General.  OPERATIVE TIME:  30 minutes.  COMPLICATIONS:  None.  INDICATION FOR PROCEDURE:  [name] is a 52 year old woman who injured her right knee in a fall in January 2014.  She has had significant persistent pain and not responsive to conservative care. With exam and MRI documenting meniscal tearing with lateral patella subluxation and patellofemoral chondromalacia.  She has failed conservative care and is now to undergo arthroscopy.  DESCRIPTION:  [name] was brought to the operating room on December 12, 2012, after knee block was placed in holding room by Anesthesia.  She was placed on operative table in supine position.  She received antibiotics preoperatively for prophylaxis.  After being placed under general anesthesia, her right knee was examined.  She had full range of motion.  Knee was stable.  Ligamentous exam with slightly lateral patellar tracking.  The right leg was prepped using sterile DuraPrep, and draped using sterile technique.  Time-out procedure was called,  correct right knee identified.  Initially, through an anterolateral portal, the arthroscope with a pump attached was placed into an anteromedial portal, an arthroscopic probe was placed.  On initial inspection of medial compartment, the articular cartilage showed grade 1 and 2 and 20% grade 3 chondromalacia, which was debrided. Medial meniscus showed a small tear 10% posterior medial horn which was resected back to a stable rim.  Intercondylar notch inspected.  Anterior and posterior cruciate ligaments were normal.  Lateral compartment showed grade 1 and 2 chondromalacia.  Lateral meniscus showed a tear of the posterior and lateral horn of which 30% was resected back to a stable rim.  Patellofemoral joint showed 50% grade 4 and 50% grade 3 chondromalacia on the patella which was debrided.  Femoral groove showed only grade 1 and 2 changes.  Lateral patellar tracking was noted. Moderate synovitis and medial gutters were debrided, otherwise they are free of pathology.  A lateral retinacular release was carried out with an ArthroCare wand significantly improving patellar tracking in normal. Decompressing the patella femoral joint.  At this point, it was felt that all pathology had been satisfactorily addressed.  The instruments were removed.  Portals closed with 3-0 nylon suture.  Sterile dressings were applied, and the patient awakened, taken to recovery in stable condition.  FOLLOWUP CARE:  [name] to be  followed as an outpatient on Norco for pain.  She will be back in the office in a week for sutures out and followup.     [name] A. Thurston Hole, M.D.   ______________________________ Elana Alm. Thurston Hole, M.D.    RAW/MEDQ  D:  12/12/2012  T:  12/13/2012  Job:  [id number]

## 2012-12-21 ENCOUNTER — Encounter (HOSPITAL_COMMUNITY): Payer: Self-pay | Admitting: Pharmacy Technician

## 2012-12-21 ENCOUNTER — Other Ambulatory Visit: Payer: Self-pay | Admitting: Neurosurgery

## 2012-12-23 ENCOUNTER — Other Ambulatory Visit (HOSPITAL_COMMUNITY): Payer: Self-pay | Admitting: *Deleted

## 2012-12-23 NOTE — Pre-Procedure Instructions (Addendum)
Cheronda L Cobb-Dean  12/23/2012   Your procedure is scheduled on:  Friday, December 30, 2012 at 7:30 AM.   Report to [facility] Entrance "A" Admitting Office at 5:30 AM.   Call this number if you have problems the morning of surgery: [phone]   Remember:   Do not eat food or drink liquids after midnight Thursday, 12/29/12.   Take these medicines the morning of surgery with A SIP OF WATER: HYDROcodone-acetaminophen (NORCO) - if needed.  Stop all Vitamins, Herbal Medications as of today, 12/26/12.    Do not wear jewelry, make-up or nail polish.  Do not wear lotions, powders, or perfumes. You may wear deodorant.  Do not shave 48 hours prior to surgery.   Do not bring valuables to the hospital.  [facility] is not responsible                  for any belongings or valuables.               Contacts, dentures or bridgework may not be worn into surgery.  Leave suitcase in the car. After surgery it may be brought to your room.  For patients admitted to the hospital, discharge time is determined by your                treatment team.                Special Instructions: Shower using CHG 2 nights before surgery and the night before surgery.  If you shower the day of surgery use CHG.  Use special wash - you have one bottle of CHG for all showers.  You should use approximately 1/3 of the bottle for each shower.   Please read over the following fact sheets that you were given: Pain Booklet, Coughing and Deep Breathing, Blood Transfusion Information, MRSA Information and Surgical Site Infection Prevention

## 2012-12-26 ENCOUNTER — Encounter (HOSPITAL_COMMUNITY): Payer: Self-pay

## 2012-12-26 ENCOUNTER — Encounter (HOSPITAL_COMMUNITY)
Admission: RE | Admit: 2012-12-26 | Discharge: 2012-12-26 | Disposition: A | Discharge status: Home / Self Care | Payer: 59 | Source: Ambulatory Visit | Attending: Neurosurgery | Admitting: Neurosurgery

## 2012-12-26 DIAGNOSIS — Z01812 Encounter for preprocedural laboratory examination: Secondary | ICD-10-CM | POA: Diagnosis present

## 2012-12-26 HISTORY — DX: Nausea with vomiting, unspecified: R11.2

## 2012-12-26 HISTORY — DX: Pneumonia, unspecified organism: J18.9

## 2012-12-26 HISTORY — DX: Nausea with vomiting, unspecified: Z98.890

## 2012-12-26 LAB — NO BLOOD PRODUCTS

## 2012-12-26 LAB — CBC
HCT: 38.5 % (ref 36.0–46.0)
Hemoglobin: 13.6 g/dL (ref 12.0–15.0)
MCH: 31.9 pg (ref 26.0–34.0)
MCHC: 35.3 g/dL (ref 30.0–36.0)
MCV: 90.4 fL (ref 78.0–100.0)
Platelets: 203 10*3/uL (ref 150–400)
RBC: 4.26 MIL/uL (ref 3.87–5.11)
RDW: 13.4 % (ref 11.5–15.5)
WBC: 5.3 10*3/uL (ref 4.0–10.5)

## 2012-12-26 LAB — BASIC METABOLIC PANEL
BUN: 8 mg/dL (ref 6–23)
CO2: 29 mEq/L (ref 19–32)
Calcium: 9.5 mg/dL (ref 8.4–10.5)
Chloride: 104 mEq/L (ref 96–112)
Creatinine, Ser: 0.55 mg/dL (ref 0.50–1.10)
GFR calc Af Amer: >90 mL/min (ref >90)
GFR calc non Af Amer: >90 mL/min (ref >90)
Glucose, Bld: 94 mg/dL (ref 70–99)
Potassium: 3.5 mEq/L (ref 3.5–5.1)
Sodium: 141 mEq/L (ref 135–145)

## 2012-12-26 LAB — SURGICAL PCR SCREEN
MRSA, PCR: NEGATIVE
Staphylococcus aureus: POSITIVE — AB

## 2012-12-26 NOTE — Progress Notes (Signed)
Anesthesia Chart Review:  Patient is a 52 year old female scheduled for L5-S1 fusion on 12/30/12 by Dr. Jeral Fruit.  History includes non-smoker, IBS, GERD, Behcet's Syndrome (on chronic steroids) with history of uveitis/optic neuritis, colitis, adrenal insufficiency, IBS, GERD, Sickle cell trait by records, osteopenia, endometriosis, cholecystectomy, hysterectomy, thyroglossal duct cyst excision, breast cyst excision, L5-S1 discectomy '12.  She is s/p right knee arthroscopy with partial medial and lateral meniscectomies, patellofemoral chondroplasty, lateral retinacular release by Dr. Thurston Hole on 12/12/12.  Dr. Thurston Hole cleared patient for this procedure from an orthopedic standpoint. PCP is Dr. Pearson Grippe.  Endocrinologist is Dr. Talmage Nap. Neurologist is Dr. Lesia Sago.  Anesthesia concerns:  She has post-operative N/V, occasional hypotension.  She is also on chronic steroids and requires stress steroids pre- and post operatively.  EKGK on 06/11/12 showed NSR, borderline low voltage in frontal leads.  1V CXR (with abdominal films) on 06/11/12 showed: The lungs are clear. Cardiac and mediastinal contours  within normal limits. No pneumothorax, pleural effusion or  suspicious pulmonary nodule. No acute cardiopulmonary disease.  Preoperative labs noted.  She has requested that only albumin or albumin containing products be administered during this hospitalization if deemed necessary--otherwise no blood products.  Her H/H are 13.6/38.5.  If no acute changes then anticipate that she can proceed as planned.  Her assigned anesthesiologist to determine stress dose steroid dosing at that time.  Velna Ochs [facility] Short Stay Center/Anesthesiology Phone [phone] 12/26/2012 3:07 PM

## 2012-12-26 NOTE — Progress Notes (Signed)
PCR screen positive for Staph.  Pt called and Dr Cassandria Santee office called spoke with Shanda Bumps and informed of positive results

## 2012-12-26 NOTE — Progress Notes (Signed)
PCP is Dr. Selena Batten Endocrinologist is Dr Shan Levans Denies seeing a cardiologist. Denies having a recent CXR Ekg noted from 06-11-12 Denies having an echo, stress test, card cath.  Pt refuses blood.  Refusal signed and will fax to blood bank.

## 2012-12-28 NOTE — H&P (Signed)
[name] is an 52 y.o. female.   Chief Complaint: lbp HPI:m patient seen in my office with lbp with radiation to the left leg for almost one year up to the point that she lost control of the leg, fell and had some rupture ligament of the knee. She had her knee repaired and now wants to go ahead with her lumbar surgery  Past Medical History  Diagnosis Date  . Endometriosis   . Endometrial polyp   . IBS (irritable bowel syndrome)   . Acid reflux   . Colitis   . Behcet's syndrome   . GERD (gastroesophageal reflux disease)   . Uveitis   . Osteopenia   . Adrenal insufficiency   . Subjective visual disturbance 07/14/2012  . Tear of lateral meniscus of right knee, current   . Asthma   . PONV (postoperative nausea and vomiting)     also low blood pressure at times  . Pneumonia     20 years ago    Past Surgical History  Procedure Laterality Date  . Cholecystectomy  12/2004  . Pelvic laparoscopy      DL  . Dilation and curettage of uterus    . Hysteroscopy    . Cesarean section  03/2001  . Knee surgery Left 05/1998  . Hand surgery    . Breast surgery      Cysts excised  . Oophorectomy      BSO  . Vaginal hysterectomy  07/2007    [facility] BSO  . Back surgery    . Knee arthroscopy with lateral menisectomy Right 12/12/2012    Procedure: KNEE ARTHROSCOPY WITH LATERAL MENISECTOMY;  Surgeon: Nilda Simmer, MD;  Location: [facility];  Service: Orthopedics;  Laterality: Right;  . Thyroid cyst excision      Family History  Problem Relation Age of Onset  . Diabetes Mother   . Breast cancer Mother     Age 12's  . Stroke Mother   . Gastric cancer Father   . Diabetes Sister   . Hypertension Sister   . Breast cancer Sister     Age 75   Social History:  reports that she has never smoked. She has never used smokeless tobacco. She reports that she does not drink alcohol or use illicit drugs.  Allergies:  Allergies  Allergen Reactions  . Celebrex [Celecoxib]  Other (See Comments)    Bleeding/bruising including bleeding for multiple days  . Latex Shortness Of Breath and Dermatitis  . Sulfa Drugs Cross Reactors Other (See Comments)    Eyes turn red, renal failure, stomach problems (vomiting, diarrhea), vasculature collapses, migraine like symptoms  . Sulfites Other (See Comments)    Eyes turn red, renal failure, stomach problems (vomiting, diarrhea), vasculature collapses, migraine like symptoms  . Epinephrine Other (See Comments)    Tachycardia  . Erythromycin Itching and Rash    Has tolerated azithromycin  . Morphine And Related Itching and Rash    Includes codeine; can take hydrocodone    No prescriptions prior to admission    No results found for this or any previous visit (from the past 48 hour(s)). No results found.  Review of Systems  Constitutional: Negative.   HENT: Negative.   Eyes: Positive for double vision.  Respiratory: Negative.   Cardiovascular: Negative.   Gastrointestinal: Negative.   Genitourinary: Positive for frequency.  Musculoskeletal: Positive for back pain.  Skin: Negative.   Endo/Heme/Allergies:       Adrenal insufficiency  There were no vitals taken for this visit. Physical Exam hent, nl. Neck, nl. Cv, nl. Lungs, clear. Abdomen. Soft. Extremities ,scar in knee. NEURO SLR positive at 45 degrees in the right leg and at 15 in the left. Absent dtr ankle. Decrease of lumbar flexibility.  Mri shows severe ddd at l5S1 , central hnp, and bilateral foramen narrrowing  Assessment/Plan Patient to go ahead with decompression and fusion at l5S1 with cages and screws. She is aware of risks and benefits  Tijuan Dantes M 12/28/2012, 12:26 PM

## 2012-12-29 MED ORDER — CEFAZOLIN SODIUM-DEXTROSE 2-3 GM-% IV SOLR
2.0000 g | INTRAVENOUS | Status: AC
  Administered 2012-12-30: 2 g via INTRAVENOUS
  Filled 2012-12-29: qty 50

## 2012-12-30 ENCOUNTER — Encounter (HOSPITAL_COMMUNITY): Payer: Self-pay | Admitting: *Deleted

## 2012-12-30 ENCOUNTER — Inpatient Hospital Stay (HOSPITAL_COMMUNITY)
Admission: RE | Admit: 2012-12-30 | Discharge: 2013-01-04 | DRG: 460 | Disposition: A | Discharge status: Home / Self Care | Payer: 59 | Source: Ambulatory Visit | Attending: Neurosurgery | Admitting: Neurosurgery

## 2012-12-30 ENCOUNTER — Inpatient Hospital Stay (HOSPITAL_COMMUNITY): Payer: 59

## 2012-12-30 ENCOUNTER — Inpatient Hospital Stay (HOSPITAL_COMMUNITY): Payer: 59 | Admitting: Vascular Surgery

## 2012-12-30 ENCOUNTER — Encounter (HOSPITAL_COMMUNITY)
Admission: RE | Disposition: A | Discharge status: Home / Self Care | Payer: 59 | Source: Ambulatory Visit | Attending: Neurosurgery

## 2012-12-30 ENCOUNTER — Encounter (HOSPITAL_COMMUNITY): Payer: 59 | Admitting: Vascular Surgery

## 2012-12-30 DIAGNOSIS — M5136 Other intervertebral disc degeneration, lumbar region: Secondary | ICD-10-CM | POA: Diagnosis present

## 2012-12-30 DIAGNOSIS — D899 Disorder involving the immune mechanism, unspecified: Secondary | ICD-10-CM | POA: Diagnosis present

## 2012-12-30 DIAGNOSIS — J45909 Unspecified asthma, uncomplicated: Secondary | ICD-10-CM | POA: Diagnosis present

## 2012-12-30 DIAGNOSIS — M51369 Other intervertebral disc degeneration, lumbar region without mention of lumbar back pain or lower extremity pain: Secondary | ICD-10-CM | POA: Diagnosis present

## 2012-12-30 DIAGNOSIS — G96198 Other disorders of meninges, not elsewhere classified: Secondary | ICD-10-CM | POA: Diagnosis present

## 2012-12-30 DIAGNOSIS — K219 Gastro-esophageal reflux disease without esophagitis: Secondary | ICD-10-CM | POA: Diagnosis present

## 2012-12-30 DIAGNOSIS — M51379 Other intervertebral disc degeneration, lumbosacral region without mention of lumbar back pain or lower extremity pain: Principal | ICD-10-CM | POA: Diagnosis present

## 2012-12-30 DIAGNOSIS — K589 Irritable bowel syndrome without diarrhea: Secondary | ICD-10-CM | POA: Diagnosis present

## 2012-12-30 DIAGNOSIS — Z981 Arthrodesis status: Secondary | ICD-10-CM

## 2012-12-30 DIAGNOSIS — IMO0002 Reserved for concepts with insufficient information to code with codable children: Secondary | ICD-10-CM

## 2012-12-30 DIAGNOSIS — M5137 Other intervertebral disc degeneration, lumbosacral region: Principal | ICD-10-CM | POA: Diagnosis present

## 2012-12-30 SURGERY — POSTERIOR LUMBAR FUSION 1 LEVEL
Anesthesia: General

## 2012-12-30 MED ORDER — LORATADINE 10 MG PO TABS
10.0000 mg | ORAL_TABLET | Freq: Every day | ORAL | Status: DC
  Administered 2012-12-31 – 2013-01-04 (×5): 10 mg via ORAL
  Filled 2012-12-30 (×6): qty 1

## 2012-12-30 MED ORDER — FENTANYL CITRATE 0.05 MG/ML IJ SOLN
INTRAMUSCULAR | Status: AC
  Filled 2012-12-30: qty 2

## 2012-12-30 MED ORDER — HYDROCORTISONE SOD SUCCINATE 100 MG IJ SOLR
INTRAMUSCULAR | Status: DC | PRN
  Administered 2012-12-30: 100 mg via INTRAVENOUS

## 2012-12-30 MED ORDER — ACETAMINOPHEN 325 MG PO TABS: 650.0000 mg | ORAL_TABLET | ORAL | Status: DC | PRN

## 2012-12-30 MED ORDER — DIAZEPAM 5 MG PO TABS
ORAL_TABLET | ORAL | Status: AC
  Filled 2012-12-30: qty 1

## 2012-12-30 MED ORDER — THROMBIN 5000 UNITS EX SOLR
OROMUCOSAL | Status: DC | PRN
  Administered 2012-12-30: 09:00:00 via TOPICAL

## 2012-12-30 MED ORDER — LACTATED RINGERS IV SOLN
INTRAVENOUS | Status: DC | PRN
  Administered 2012-12-30 (×3): via INTRAVENOUS

## 2012-12-30 MED ORDER — ARTIFICIAL TEARS OP OINT
TOPICAL_OINTMENT | OPHTHALMIC | Status: DC | PRN
  Administered 2012-12-30: 1 via OPHTHALMIC

## 2012-12-30 MED ORDER — PHENOL 1.4 % MT LIQD
1.0000 | OROMUCOSAL | Status: DC | PRN
  Filled 2012-12-30: qty 177

## 2012-12-30 MED ORDER — HYDROCODONE-ACETAMINOPHEN 10-325 MG PO TABS
1.0000 | ORAL_TABLET | ORAL | Status: DC | PRN
  Administered 2012-12-31 – 2013-01-04 (×15): 1 via ORAL
  Filled 2012-12-30 (×15): qty 1

## 2012-12-30 MED ORDER — SODIUM CHLORIDE 0.9 % IV SOLN
INTRAVENOUS | Status: DC
  Administered 2012-12-30: 14:00:00 via INTRAVENOUS

## 2012-12-30 MED ORDER — BACITRACIN ZINC 500 UNIT/GM EX OINT
TOPICAL_OINTMENT | CUTANEOUS | Status: DC | PRN
  Administered 2012-12-30: 1 via TOPICAL

## 2012-12-30 MED ORDER — ONDANSETRON HCL 4 MG/2ML IJ SOLN
4.0000 mg | Freq: Once | INTRAMUSCULAR | Status: AC | PRN
  Administered 2012-12-30 (×2): 4 mg via INTRAVENOUS

## 2012-12-30 MED ORDER — 0.9 % SODIUM CHLORIDE (POUR BTL) OPTIME
TOPICAL | Status: DC | PRN
  Administered 2012-12-30: 1000 mL

## 2012-12-30 MED ORDER — DIPHENHYDRAMINE HCL 50 MG/ML IJ SOLN: 12.5000 mg | Freq: Four times a day (QID) | INTRAMUSCULAR | Status: DC | PRN

## 2012-12-30 MED ORDER — LIDOCAINE HCL (CARDIAC) 20 MG/ML IV SOLN
INTRAVENOUS | Status: DC | PRN
  Administered 2012-12-30: 40 mg via INTRAVENOUS

## 2012-12-30 MED ORDER — ZOLPIDEM TARTRATE 5 MG PO TABS: 10.0000 mg | ORAL_TABLET | Freq: Every evening | ORAL | Status: DC | PRN

## 2012-12-30 MED ORDER — ONDANSETRON HCL 4 MG/2ML IJ SOLN: 4.0000 mg | Freq: Four times a day (QID) | INTRAMUSCULAR | Status: DC | PRN

## 2012-12-30 MED ORDER — MENTHOL 3 MG MT LOZG
1.0000 | LOZENGE | OROMUCOSAL | Status: DC | PRN
  Filled 2012-12-30 (×2): qty 9

## 2012-12-30 MED ORDER — BUPIVACAINE LIPOSOME 1.3 % IJ SUSP
INTRAMUSCULAR | Status: DC | PRN
  Administered 2012-12-30: 20 mL

## 2012-12-30 MED ORDER — ONDANSETRON HCL 4 MG/2ML IJ SOLN
4.0000 mg | INTRAMUSCULAR | Status: DC | PRN
  Filled 2012-12-30: qty 2

## 2012-12-30 MED ORDER — SODIUM CHLORIDE 0.9 % IJ SOLN: 9.0000 mL | INTRAMUSCULAR | Status: DC | PRN

## 2012-12-30 MED ORDER — PROPOFOL 10 MG/ML IV BOLUS
INTRAVENOUS | Status: DC | PRN
  Administered 2012-12-30: 180 mg via INTRAVENOUS

## 2012-12-30 MED ORDER — DIPHENHYDRAMINE HCL 12.5 MG/5ML PO ELIX: 12.5000 mg | ORAL_SOLUTION | Freq: Four times a day (QID) | ORAL | Status: DC | PRN

## 2012-12-30 MED ORDER — NALOXONE HCL 0.4 MG/ML IJ SOLN: 0.4000 mg | INTRAMUSCULAR | Status: DC | PRN

## 2012-12-30 MED ORDER — GLYCOPYRROLATE 0.2 MG/ML IJ SOLN
INTRAMUSCULAR | Status: DC | PRN
  Administered 2012-12-30: 0.4 mg via INTRAVENOUS

## 2012-12-30 MED ORDER — ACETAMINOPHEN 650 MG RE SUPP: 650.0000 mg | RECTAL | Status: DC | PRN

## 2012-12-30 MED ORDER — SODIUM CHLORIDE 0.9 % IJ SOLN
3.0000 mL | Freq: Two times a day (BID) | INTRAMUSCULAR | Status: DC
  Administered 2013-01-01 – 2013-01-04 (×6): 3 mL via INTRAVENOUS

## 2012-12-30 MED ORDER — ROCURONIUM BROMIDE 100 MG/10ML IV SOLN
INTRAVENOUS | Status: DC | PRN
  Administered 2012-12-30: 50 mg via INTRAVENOUS

## 2012-12-30 MED ORDER — OXYCODONE HCL 5 MG/5ML PO SOLN: 5.0000 mg | Freq: Once | ORAL | Status: AC | PRN

## 2012-12-30 MED ORDER — PANTOPRAZOLE SODIUM 40 MG PO TBEC
40.0000 mg | DELAYED_RELEASE_TABLET | Freq: Every day | ORAL | Status: DC
  Administered 2012-12-30 – 2013-01-04 (×6): 40 mg via ORAL
  Filled 2012-12-30 (×4): qty 1

## 2012-12-30 MED ORDER — SCOPOLAMINE 1 MG/3DAYS TD PT72
MEDICATED_PATCH | TRANSDERMAL | Status: DC | PRN
  Administered 2012-12-30: 1 via TRANSDERMAL

## 2012-12-30 MED ORDER — NEOSTIGMINE METHYLSULFATE 1 MG/ML IJ SOLN
INTRAMUSCULAR | Status: DC | PRN
  Administered 2012-12-30: 3 mg via INTRAVENOUS

## 2012-12-30 MED ORDER — OXYBUTYNIN CHLORIDE 5 MG PO TABS
5.0000 mg | ORAL_TABLET | Freq: Three times a day (TID) | ORAL | Status: DC
  Administered 2012-12-31 – 2013-01-01 (×2): 5 mg via ORAL
  Filled 2012-12-30 (×19): qty 1

## 2012-12-30 MED ORDER — PREDNISONE 5 MG PO TABS
5.0000 mg | ORAL_TABLET | Freq: Every day | ORAL | Status: DC
  Administered 2012-12-31 – 2013-01-04 (×5): 5 mg via ORAL
  Filled 2012-12-30 (×8): qty 1

## 2012-12-30 MED ORDER — B COMPLEX-C PO TABS
1.0000 | ORAL_TABLET | Freq: Every day | ORAL | Status: DC
  Administered 2012-12-30 – 2013-01-04 (×6): 1 via ORAL
  Filled 2012-12-30 (×6): qty 1

## 2012-12-30 MED ORDER — RISEDRONATE SODIUM 35 MG PO TBEC: 1.0000 | DELAYED_RELEASE_TABLET | ORAL | Status: DC

## 2012-12-30 MED ORDER — THROMBIN 20000 UNITS EX SOLR
CUTANEOUS | Status: DC | PRN
  Administered 2012-12-30: 09:00:00 via TOPICAL

## 2012-12-30 MED ORDER — FENTANYL CITRATE 0.05 MG/ML IJ SOLN
INTRAMUSCULAR | Status: DC | PRN
Start: 2012-12-30 — End: 2012-12-30
  Administered 2012-12-30: 50 ug via INTRAVENOUS
  Administered 2012-12-30 (×2): 100 ug via INTRAVENOUS
  Administered 2012-12-30: 150 ug via INTRAVENOUS
  Administered 2012-12-30: 100 ug via INTRAVENOUS
  Administered 2012-12-30: 50 ug via INTRAVENOUS
  Administered 2012-12-30 (×2): 100 ug via INTRAVENOUS

## 2012-12-30 MED ORDER — BUPIVACAINE LIPOSOME 1.3 % IJ SUSP
20.0000 mL | INTRAMUSCULAR | Status: AC
  Filled 2012-12-30: qty 20

## 2012-12-30 MED ORDER — OXYCODONE HCL 5 MG PO TABS
5.0000 mg | ORAL_TABLET | Freq: Once | ORAL | Status: AC | PRN
  Administered 2012-12-30: 5 mg via ORAL

## 2012-12-30 MED ORDER — COLCHICINE 0.6 MG PO TABS
0.6000 mg | ORAL_TABLET | Freq: Two times a day (BID) | ORAL | Status: DC
  Administered 2012-12-30 – 2013-01-04 (×10): 0.6 mg via ORAL
  Filled 2012-12-30 (×13): qty 1

## 2012-12-30 MED ORDER — OXYCODONE HCL 5 MG PO TABS
ORAL_TABLET | ORAL | Status: AC
  Filled 2012-12-30: qty 1

## 2012-12-30 MED ORDER — SODIUM CHLORIDE 0.9 % IJ SOLN: 3.0000 mL | INTRAMUSCULAR | Status: DC | PRN

## 2012-12-30 MED ORDER — DIAZEPAM 5 MG PO TABS
5.0000 mg | ORAL_TABLET | Freq: Four times a day (QID) | ORAL | Status: DC | PRN
  Administered 2012-12-30 – 2013-01-04 (×12): 5 mg via ORAL
  Filled 2012-12-30 (×11): qty 1

## 2012-12-30 MED ORDER — SODIUM CHLORIDE 0.9 % IV SOLN: 250.0000 mL | INTRAVENOUS | Status: DC

## 2012-12-30 MED ORDER — FENTANYL CITRATE 0.05 MG/ML IJ SOLN
25.0000 ug | INTRAMUSCULAR | Status: DC | PRN
  Administered 2012-12-30 (×4): 50 ug via INTRAVENOUS

## 2012-12-30 MED ORDER — CEFAZOLIN SODIUM 1-5 GM-% IV SOLN
1.0000 g | Freq: Three times a day (TID) | INTRAVENOUS | Status: AC
  Administered 2012-12-30 – 2012-12-31 (×2): 1 g via INTRAVENOUS
  Filled 2012-12-30 (×4): qty 50

## 2012-12-30 MED ORDER — FENTANYL 10 MCG/ML IV SOLN
INTRAVENOUS | Status: DC
  Administered 2012-12-30: 12:00:00 via INTRAVENOUS
  Administered 2012-12-30: 120 ug/h via INTRAVENOUS
  Administered 2012-12-30: 60 ug via INTRAVENOUS
  Filled 2012-12-30 (×2): qty 50

## 2012-12-30 MED ORDER — MIDAZOLAM HCL 5 MG/5ML IJ SOLN
INTRAMUSCULAR | Status: DC | PRN
  Administered 2012-12-30: 2 mg via INTRAVENOUS

## 2012-12-30 MED ORDER — SCOPOLAMINE 1 MG/3DAYS TD PT72
MEDICATED_PATCH | TRANSDERMAL | Status: AC
  Administered 2012-12-30: 18:00:00
  Filled 2012-12-30: qty 1

## 2012-12-30 MED ORDER — ACETAMINOPHEN 10 MG/ML IV SOLN
INTRAVENOUS | Status: AC
  Administered 2012-12-30: 1000 mg via INTRAVENOUS
  Filled 2012-12-30: qty 100

## 2012-12-30 SURGICAL SUPPLY — 72 items
APL SKNCLS STERI-STRIP NONHPOA (GAUZE/BANDAGES/DRESSINGS) ×1
BENZOIN TINCTURE PRP APPL 2/3 (GAUZE/BANDAGES/DRESSINGS) ×2 IMPLANT
BLADE SURG ROTATE 9660 (MISCELLANEOUS) IMPLANT
BUR ACORN 6.0 (BURR) ×2 IMPLANT
BUR MATCHSTICK NEURO 3.0 LAGG (BURR) ×2 IMPLANT
CANISTER SUCT 3000ML (MISCELLANEOUS) ×2 IMPLANT
CAP REVERE LOCKING (Cap) ×4 IMPLANT
CONT SPEC 4OZ CLIKSEAL STRL BL (MISCELLANEOUS) ×3 IMPLANT
COVER BACK TABLE 24X17X13 BIG (DRAPES) IMPLANT
COVER TABLE BACK 60X90 (DRAPES) ×2 IMPLANT
DRAPE C-ARM 42X72 X-RAY (DRAPES) ×4 IMPLANT
DRAPE LAPAROTOMY 100X72X124 (DRAPES) ×2 IMPLANT
DRAPE MICROSCOPE LEICA (MISCELLANEOUS) ×1 IMPLANT
DRAPE POUCH INSTRU U-SHP 10X18 (DRAPES) ×2 IMPLANT
DRSG PAD ABDOMINAL 8X10 ST (GAUZE/BANDAGES/DRESSINGS) ×1 IMPLANT
DURAPREP 26ML APPLICATOR (WOUND CARE) ×2 IMPLANT
ELECT REM PT RETURN 9FT ADLT (ELECTROSURGICAL) ×2
ELECTRODE REM PT RTRN 9FT ADLT (ELECTROSURGICAL) ×1 IMPLANT
EVACUATOR 1/8 PVC DRAIN (DRAIN) ×1 IMPLANT
GAUZE SPONGE 4X4 16PLY XRAY LF (GAUZE/BANDAGES/DRESSINGS) ×2 IMPLANT
GLOVE BIOGEL M 8.0 STRL (GLOVE) ×1 IMPLANT
GLOVE BIOGEL PI IND STRL 8 (GLOVE) IMPLANT
GLOVE BIOGEL PI INDICATOR 8 (GLOVE) ×1
GLOVE EXAM NITRILE LRG STRL (GLOVE) IMPLANT
GLOVE EXAM NITRILE MD LF STRL (GLOVE) IMPLANT
GLOVE EXAM NITRILE XL STR (GLOVE) IMPLANT
GLOVE EXAM NITRILE XS STR PU (GLOVE) IMPLANT
GLOVE SURG SS PI 7.5 STRL IVOR (GLOVE) ×2 IMPLANT
GLOVE SURG SS PI 8.0 STRL IVOR (GLOVE) ×2 IMPLANT
GOWN BRE IMP SLV AUR LG STRL (GOWN DISPOSABLE) ×2 IMPLANT
GOWN BRE IMP SLV AUR XL STRL (GOWN DISPOSABLE) IMPLANT
GOWN STRL REIN 2XL LVL4 (GOWN DISPOSABLE) ×2 IMPLANT
KIT BASIN OR (CUSTOM PROCEDURE TRAY) ×2 IMPLANT
KIT INFUSE MEDIUM (Orthopedic Implant) ×1 IMPLANT
KIT ROOM TURNOVER OR (KITS) ×2 IMPLANT
MILL MEDIUM DISP (BLADE) ×1 IMPLANT
NDL HYPO 18GX1.5 BLUNT FILL (NEEDLE) IMPLANT
NDL HYPO 21X1.5 SAFETY (NEEDLE) IMPLANT
NDL HYPO 25X1 1.5 SAFETY (NEEDLE) IMPLANT
NEEDLE HYPO 18GX1.5 BLUNT FILL (NEEDLE) IMPLANT
NEEDLE HYPO 21X1.5 SAFETY (NEEDLE) IMPLANT
NEEDLE HYPO 25X1 1.5 SAFETY (NEEDLE) IMPLANT
NS IRRIG 1000ML POUR BTL (IV SOLUTION) ×2 IMPLANT
PACK LAMINECTOMY NEURO (CUSTOM PROCEDURE TRAY) ×2 IMPLANT
PAD ABD 8X10 STRL (GAUZE/BANDAGES/DRESSINGS) IMPLANT
PAD ARMBOARD 7.5X6 YLW CONV (MISCELLANEOUS) ×6 IMPLANT
PATTIES SURGICAL .5 X1 (DISPOSABLE) ×2 IMPLANT
PATTIES SURGICAL .5 X3 (DISPOSABLE) IMPLANT
ROD REVERE CURVED 6.35X35MM (Rod) ×2 IMPLANT
RUBBERBAND STERILE (MISCELLANEOUS) ×2 IMPLANT
SCREW REVERE 6.35 5.5X40MM (Screw) ×4 IMPLANT
SPACER SUSTAIN O SML 8X22 8MM (Spacer) ×2 IMPLANT
SPONGE GAUZE 4X4 12PLY (GAUZE/BANDAGES/DRESSINGS) ×2 IMPLANT
SPONGE LAP 4X18 X RAY DECT (DISPOSABLE) IMPLANT
SPONGE NEURO XRAY DETECT 1X3 (DISPOSABLE) IMPLANT
SPONGE SURGIFOAM ABS GEL 100 (HEMOSTASIS) ×2 IMPLANT
STAPLER VISISTAT 35W (STAPLE) ×1 IMPLANT
STRIP CLOSURE SKIN 1/2X4 (GAUZE/BANDAGES/DRESSINGS) ×2 IMPLANT
SUT PROLENE 6 0 BV (SUTURE) ×1 IMPLANT
SUT VIC AB 1 CT1 18XBRD ANBCTR (SUTURE) ×2 IMPLANT
SUT VIC AB 1 CT1 8-18 (SUTURE) ×4
SUT VIC AB 2-0 CP2 18 (SUTURE) ×2 IMPLANT
SUT VIC AB 3-0 SH 8-18 (SUTURE) ×2 IMPLANT
SYR 20CC LL (SYRINGE) IMPLANT
SYR 20ML ECCENTRIC (SYRINGE) ×2 IMPLANT
SYR 5ML LL (SYRINGE) IMPLANT
TAPE CLOTH SURG 4X10 WHT LF (GAUZE/BANDAGES/DRESSINGS) ×1 IMPLANT
TOWEL OR 17X24 6PK STRL BLUE (TOWEL DISPOSABLE) ×2 IMPLANT
TOWEL OR 17X26 10 PK STRL BLUE (TOWEL DISPOSABLE) ×2 IMPLANT
TRAY FOLEY BAG SILVER LF 14FR (CATHETERS) ×1 IMPLANT
TRAY FOLEY CATH 14FRSI W/METER (CATHETERS) ×1 IMPLANT
WATER STERILE IRR 1000ML POUR (IV SOLUTION) ×2 IMPLANT

## 2012-12-30 NOTE — Anesthesia Postprocedure Evaluation (Signed)
  Anesthesia Post-op Note  Patient: [name]  Procedure(s) Performed: Procedure(s) with comments: LUMBAR FIVE TO SACRAL ONE POSTERIOR LUMBAR FUSION 1 LEVEL (N/A) - L5-S1 Lumbar fusion/cages/pedicle screws/posterolateral arthrodesis  Patient Location: PACU  Anesthesia Type:General  Level of Consciousness: awake, alert  and oriented  Airway and Oxygen Therapy: Patient Spontanous Breathing and Patient connected to nasal cannula oxygen  Post-op Pain: mild  Post-op Assessment: Post-op Vital signs reviewed, Patient's Cardiovascular Status Stable, Respiratory Function Stable, Patent Airway and Pain level controlled  Post-op Vital Signs: stable  Complications: No apparent anesthesia complications

## 2012-12-30 NOTE — Progress Notes (Signed)
Patient requested foley removed and wanted to walk hallway. Foley d/c'd per protocol and patient ambulated three doors down form her room and back Stated she felt better.

## 2012-12-30 NOTE — Op Note (Signed)
[name], [name]           ACCOUNT NO.:  [id number]  MEDICAL RECORD NO.:  [id number]  LOCATION:  [id number]                        FACILITY:  [facility]  PHYSICIAN:  Hilda Lias, M.D.   DATE OF BIRTH:  03-30-1960  DATE OF PROCEDURE:  12/30/2012 DATE OF DISCHARGE:                              OPERATIVE REPORT   PREOPERATIVE DIAGNOSIS:  L5-S1 degenerative disk disease with chronic radiculopathy.  Status post left L5-S1 diskectomy.  POSTOPERATIVE DIAGNOSIS:  L5-S1 degenerative disk disease with chronic radiculopathy.  Status post left L5-S1 diskectomy.  PROCEDURE:  Bilateral L5 laminectomy and facetectomy, lysis of adhesion, repair of dorsal pseudomeningocele, L5-S1 total diskectomy, insertion of two cages of 8 x 22.  Posterolateral arthrodesis with autograft and BMP. Diskectomy done bilaterally, more than normal.  Dissection of the disk to be able to introduce the two cages.  Microscope.  Pedicle screws.  SURGEON:  Hilda Lias, M.D.  ASSISTANT:  Stefani Dama, M.D.  CLINICAL HISTORY:  The patient is a lady who has a multiple history of immune disease who had been taking prednisone for many years.  In the past, she had diskectomy at the level L5-S1.  Lately, the pain is getting worse, and it seems she feels she ended up having to have need for surgery.  X-rays showed that she has severe degenerative disk disease with bilateral compromise.  The patient had been taking prednisone for many years secondary to an immune disorder.  The patient knew the risk with surgery such as no improvement, CSF leak, infection, hematoma, and need of further surgery.  PROCEDURE:  The patient was taken to the OR, and after intubation, she was positioned in a prone manner.  The back was cleaned first with Betadine and later on with DuraPrep.  Drapes were applied.  Midline incision resecting the previous scar was done through the skin, subcutaneous tissue straight down to the lumbar area.   Dissection in the right side was done all the way down to the facet.  In the left side, the dissection was carried down, and in the dissection there was some CSF coming.  We found that the patient has a small pseudomeningocele, which was repaired with 6-0 Prolene using the microscope.  From then on, lysis of adhesion was accomplished.  The patient has quite a bit of scar tissue up to the point that it took [place] half an hour to be able to decompress the left L5 and S1 nerve root, as well as the thecal sac. Then, we opened the disk space in the right side.  We did a total gross diskectomy.  We introduced disk shaver and total diskectomy medially and lateral to the right side more than normal was accomplished.  In the left side, it took [place] a little bit longer because of the adhesion but at the end, we were able to do a general diskectomy.  Then two cages of 8 x 22 were inserted.  The cages had BMP distally with Autograft.  Lateral x- rays showed that indeed the cages were in good position.  Then using the C-arm 1st in AP view and then in lateral view, we probed the pedicle of L5 and S1.  Prior to insertion of  the screws, we feel all four quadrants just to be sure that we were surrounded by bone.  Then, 4 screws of 5.5 x 40 were introduced.  A rod was used to unite the screws.  The rods were kept in place using caps.  Again, the x-ray showed good position of the pedicle screws as well as the cages.  With the drill, we drilled the lateral aspect of the L5 facet as well as the ala of the sacrum.  A mix of BMP and autograft was done for arthrodesis.  Valsalva maneuver twice up to 40 was negative.  Then, the area was irrigated.  A drain was left in the operative site, and the wound was closed with Vicryl and staples.          ______________________________ Hilda Lias, M.D.     EB/MEDQ  D:  12/30/2012  T:  12/30/2012  Job:  [id number]

## 2012-12-30 NOTE — Progress Notes (Signed)

## 2012-12-30 NOTE — Progress Notes (Signed)
Patient ID: [name], female   DOB: 07-10-1960, 52 y.o.   MRN: [id number] Walking, no leg pain. C/o incisional pain

## 2012-12-30 NOTE — Anesthesia Preprocedure Evaluation (Signed)
Anesthesia Evaluation  Patient identified by MRN, date of birth, ID band Patient awake    Reviewed: Allergy & Precautions, H&P , NPO status , Patient's Chart, lab work & pertinent test results  Airway Mallampati: II TM Distance: >3 FB Neck ROM: Full    Dental  (+) Teeth Intact and Dental Advisory Given   Pulmonary          Cardiovascular Rhythm:Regular Rate:Normal     Neuro/Psych    GI/Hepatic   Endo/Other    Renal/GU      Musculoskeletal   Abdominal (+) + obese,   Peds  Hematology   Anesthesia Other Findings   Reproductive/Obstetrics                           Anesthesia Physical Anesthesia Plan  ASA: III  Anesthesia Plan: General   Post-op Pain Management:    Induction: Intravenous  Airway Management Planned: Oral ETT  Additional Equipment:   Intra-op Plan:   Post-operative Plan: Extubation in OR  Informed Consent: I have reviewed the patients History and Physical, chart, labs and discussed the procedure including the risks, benefits and alternatives for the proposed anesthesia with the patient or authorized representative who has indicated his/her understanding and acceptance.   Dental advisory given  Plan Discussed with: CRNA and Anesthesiologist  Anesthesia Plan Comments: (Lumbar spondylosis IBS Behcet's syndrome on chronic prednisone   Plan GA with oral ETT  Kipp Brood, MD)        Anesthesia Quick Evaluation

## 2012-12-30 NOTE — Transfer of Care (Signed)
Immediate Anesthesia Transfer of Care Note  Patient: [name]  Procedure(s) Performed: Procedure(s) with comments: LUMBAR FIVE TO SACRAL ONE POSTERIOR LUMBAR FUSION 1 LEVEL (N/A) - L5-S1 Lumbar fusion/cages/pedicle screws/posterolateral arthrodesis  Patient Location: PACU  Anesthesia Type:General  Level of Consciousness: sedated, patient cooperative and responds to stimulation  Airway & Oxygen Therapy: Patient Spontanous Breathing and Patient connected to nasal cannula oxygen  Post-op Assessment: Report given to PACU RN, Post -op Vital signs reviewed and stable and Patient moving all extremities  Post vital signs: Reviewed and stable  Complications: No apparent anesthesia complications

## 2012-12-30 NOTE — Progress Notes (Signed)
Op note [phone]

## 2012-12-31 LAB — COMPREHENSIVE METABOLIC PANEL
ALT: 22 U/L (ref 0–35)
AST: 35 U/L (ref 0–37)
Albumin: 3.3 g/dL — ABNORMAL LOW (ref 3.5–5.2)
Alkaline Phosphatase: 59 U/L (ref 39–117)
BUN: 4 mg/dL — ABNORMAL LOW (ref 6–23)
CO2: 29 mEq/L (ref 19–32)
Calcium: 8.6 mg/dL (ref 8.4–10.5)
Chloride: 105 mEq/L (ref 96–112)
Creatinine, Ser: 0.62 mg/dL (ref 0.50–1.10)
GFR calc Af Amer: >90 mL/min (ref >90)
GFR calc non Af Amer: >90 mL/min (ref >90)
Glucose, Bld: 135 mg/dL — ABNORMAL HIGH (ref 70–99)
Potassium: 3.3 mEq/L — ABNORMAL LOW (ref 3.5–5.1)
Sodium: 143 mEq/L (ref 135–145)
Total Bilirubin: 0.8 mg/dL (ref 0.3–1.2)
Total Protein: 6.8 g/dL (ref 6.0–8.3)

## 2012-12-31 LAB — CBC
HCT: 33.5 % — ABNORMAL LOW (ref 36.0–46.0)
Hemoglobin: 11.5 g/dL — ABNORMAL LOW (ref 12.0–15.0)
MCH: 31 pg (ref 26.0–34.0)
MCHC: 34.3 g/dL (ref 30.0–36.0)
MCV: 90.3 fL (ref 78.0–100.0)
Platelets: 175 10*3/uL (ref 150–400)
RBC: 3.71 MIL/uL — ABNORMAL LOW (ref 3.87–5.11)
RDW: 13.3 % (ref 11.5–15.5)
WBC: 11.5 10*3/uL — ABNORMAL HIGH (ref 4.0–10.5)

## 2012-12-31 MED ORDER — DIPHENHYDRAMINE HCL 12.5 MG/5ML PO ELIX: 12.5000 mg | ORAL_SOLUTION | Freq: Four times a day (QID) | ORAL | Status: DC | PRN

## 2012-12-31 MED ORDER — HYDROMORPHONE 0.3 MG/ML IV SOLN
INTRAVENOUS | Status: DC
  Administered 2012-12-31: 0.6 mg via INTRAVENOUS
  Administered 2012-12-31: 0.3 mg via INTRAVENOUS
  Administered 2012-12-31: 0.6 mg via INTRAVENOUS
  Administered 2012-12-31: 0.9 mg via INTRAVENOUS
  Administered 2012-12-31: 01:00:00 via INTRAVENOUS
  Administered 2012-12-31: 0.9 mg via INTRAVENOUS
  Administered 2013-01-01: 0.3 mg via INTRAVENOUS
  Administered 2013-01-01: 0.9 mg via INTRAVENOUS
  Administered 2013-01-01: 0.6 mg via INTRAVENOUS
  Administered 2013-01-01: 0.3 mg via INTRAVENOUS
  Administered 2013-01-01: 0.6 mg via INTRAVENOUS
  Administered 2013-01-02: 0.3 mg via INTRAVENOUS
  Filled 2012-12-31: qty 25

## 2012-12-31 MED ORDER — NALOXONE HCL 0.4 MG/ML IJ SOLN: 0.4000 mg | INTRAMUSCULAR | Status: DC | PRN

## 2012-12-31 MED ORDER — ONDANSETRON HCL 4 MG/2ML IJ SOLN
4.0000 mg | Freq: Four times a day (QID) | INTRAMUSCULAR | Status: DC | PRN
  Administered 2012-12-31 – 2013-01-02 (×3): 4 mg via INTRAVENOUS
  Filled 2012-12-31 (×3): qty 2

## 2012-12-31 MED ORDER — DIPHENHYDRAMINE HCL 50 MG/ML IJ SOLN: 12.5000 mg | Freq: Four times a day (QID) | INTRAMUSCULAR | Status: DC | PRN

## 2012-12-31 MED ORDER — SODIUM CHLORIDE 0.9 % IJ SOLN: 9.0000 mL | INTRAMUSCULAR | Status: DC | PRN

## 2012-12-31 NOTE — Evaluation (Signed)
Physical Therapy Evaluation Patient Details Name: [name] MRN: [id number] DOB: Aug 24, 1960 Today's Date: 12/31/2012 Time: 4696-2952 PT Time Calculation (min): 47 min  PT Assessment / Plan / Recommendation History of Present Illness  patient seen in my office with lbp with radiation to the left leg for almost one year up to the point that she lost control of the leg, fell and had some rupture ligament of the knee. She had her knee repaired and now wants to go ahead with her lumbar surgery. s/p L5-S1 lami, disk, fusion.  Clinical Impression  Pt doing worse today than pre eval due to nausea and headache with more weakness than yesterday.  Expect she will bounce back quickly and be able to D/C soon.  HHPT if fails to improve quickly as expected.    PT Assessment  Patient needs continued PT services    Follow Up Recommendations  No PT follow up    Does the patient have the potential to tolerate intense rehabilitation      Barriers to Discharge        Equipment Recommendations  Other (comment) (check to see if has RW)    Recommendations for Other Services     Frequency Min 5X/week    Precautions / Restrictions Precautions Precautions: Back Required Braces or Orthoses: Spinal Brace Spinal Brace: Lumbar corset   Pertinent Vitals/Pain       Mobility  Bed Mobility Bed Mobility: Left Sidelying to Sit;Sit to Sidelying Left Left Sidelying to Sit: 3: Mod assist;HOB flat;With rails Sit to Sidelying Left: 4: Min guard;HOB flat;With rail Details for Bed Mobility Assistance: cues for safe technique Transfers Transfers: Sit to Stand;Stand to Sit Sit to Stand: 4: Min guard;With upper extremity assist;From bed;From toilet Stand to Sit: 4: Min guard;With upper extremity assist;To bed;To toilet Details for Transfer Assistance: cues for safer technique Ambulation/Gait Ambulation/Gait Assistance: 4: Min guard Ambulation Distance (Feet): 12 Feet (times 2) Assistive device:  Rolling walker Ambulation/Gait Assistance Details: weak kneed gait Gait Pattern: Step-through pattern Stairs: No    Exercises     PT Diagnosis: Generalized weakness;Acute pain  PT Problem List: Decreased strength;Decreased activity tolerance;Decreased mobility;Decreased knowledge of use of DME;Decreased knowledge of precautions;Pain PT Treatment Interventions: DME instruction;Gait training;Stair training;Functional mobility training;Therapeutic activities;Patient/family education     PT Goals(Current goals can be found in the care plan section) Acute Rehab PT Goals Patient Stated Goal: back home and Independent PT Goal Formulation: With patient Time For Goal Achievement: 01/07/13 Potential to Achieve Goals: Good  Visit Information  Last PT Received On: 12/31/12 Assistance Needed: +1 History of Present Illness: patient seen in my office with lbp with radiation to the left leg for almost one year up to the point that she lost control of the leg, fell and had some rupture ligament of the knee. She had her knee repaired and now wants to go ahead with her lumbar surgery. s/p L5-S1 lami, disk, fusion.       Prior Functioning  Home Living Family/patient expects to be discharged to:: Private residence Living Arrangements: Children Available Help at Discharge: Family Type of Home: House Home Access: Stairs to enter Home Equipment: Other (comment) (TBA) Prior Function Level of Independence: Independent Communication Communication: No difficulties    Cognition  Cognition Arousal/Alertness: Awake/alert Overall Cognitive Status: Within Functional Limits for tasks assessed    Extremity/Trunk Assessment Upper Extremity Assessment Upper Extremity Assessment: Defer to OT evaluation Lower Extremity Assessment Lower Extremity Assessment: Generalized weakness   Balance Balance Balance Assessed: No  End of Session PT - End of Session Equipment Utilized During Treatment: Gait belt;Back  brace Activity Tolerance: Patient tolerated treatment well Patient left: in bed;with call bell/phone within reach;with family/visitor present Nurse Communication: Mobility status  GP     [name], [name] 12/31/2012, 5:27 PM 12/31/2012  [name], PT [phone]  (pager)

## 2012-12-31 NOTE — Progress Notes (Signed)
Sensitive to fentanyl. Some pain in the hip. On dilaudid. No headache. No more n/v. Drain removed. Going to the bathroom. To get lytes

## 2012-12-31 NOTE — Progress Notes (Signed)
OT Cancellation Note  Patient Details Name: [name] MRN: [id number] DOB: 09-01-1960   Cancelled Treatment:    Reason Eval/Treat Not Completed: Medical issues which prohibited therapy.  Pt with n/v this morning.  When talking to pt, she c/o seeing more than one of therapist and states that every time she tries to get up with RN to use bathroom, she vomits.  Will re-attempt later this afternoon if pt is feeling better.  12/31/2012 Cipriano Mile OTR/L Pager [phone] Office [phone]

## 2012-12-31 NOTE — Evaluation (Signed)
Occupational Therapy Evaluation Patient Details Name: [name] MRN: [id number] DOB: 1960-01-08 Today's Date: 12/31/2012 Time: 0865-7846 OT Time Calculation (min): 37 min  OT Assessment / Plan / Recommendation History of present illness patient seen in my office with lbp with radiation to the left leg for almost one year up to the point that she lost control of the leg, fell and had some rupture ligament of the knee. She had her knee repaired and now wants to go ahead with her lumbar surgery. s/p L5-S1 lami, disk, fusion.   Clinical Impression   Pt admitted with above. Limited today by nausea and pain.  Will continue to follow acutely in order to address below problem list.     OT Assessment  Patient needs continued OT Services    Follow Up Recommendations  Supervision/Assistance - 24 hour;No OT follow up    Barriers to Discharge      Equipment Recommendations  None recommended by OT    Recommendations for Other Services    Frequency  Min 2X/week    Precautions / Restrictions Precautions Precautions: Back Required Braces or Orthoses: Spinal Brace Spinal Brace: Lumbar corset   Pertinent Vitals/Pain See vitals    ADL  Grooming: Performed;Wash/dry hands;Min guard Where Assessed - Grooming: Unsupported standing Upper Body Dressing: Simulated;Supervision/safety (don/doff back brace) Where Assessed - Upper Body Dressing: Unsupported sitting Toilet Transfer: Performed;Min guard Toilet Transfer Method: Sit to Barista: Comfort height toilet Toileting - Clothing Manipulation and Hygiene: Performed;Min guard Where Assessed - Engineer, mining and Hygiene: Sit to stand from 3-in-1 or toilet Equipment Used: Back brace;Gait belt;Rolling walker Transfers/Ambulation Related to ADLs: Min guard with RW ADL Comments: Pt requiring incr time during all tasks due to pain and nausea.    OT Diagnosis: Generalized weakness;Acute pain  OT  Problem List: Decreased strength;Decreased activity tolerance;Decreased knowledge of precautions;Decreased knowledge of use of DME or AE;Pain OT Treatment Interventions: Self-care/ADL training;DME and/or AE instruction;Therapeutic activities;Patient/family education   OT Goals(Current goals can be found in the care plan section) Acute Rehab OT Goals Patient Stated Goal: back home and Independent OT Goal Formulation: With patient Time For Goal Achievement: 01/07/13 Potential to Achieve Goals: Good  Visit Information  Last OT Received On: 12/31/12 Assistance Needed: +1 History of Present Illness: patient seen in my office with lbp with radiation to the left leg for almost one year up to the point that she lost control of the leg, fell and had some rupture ligament of the knee. She had her knee repaired and now wants to go ahead with her lumbar surgery. s/p L5-S1 lami, disk, fusion.       Prior Functioning     Home Living Family/patient expects to be discharged to:: Private residence Living Arrangements: Children Available Help at Discharge: Family Type of Home: House Home Access: Stairs to enter Home Layout: One level Home Equipment: Toilet riser;Shower seat - built in Prior Function Level of Independence: Independent Communication Communication: No difficulties         Vision/Perception     Copywriter, advertising Arousal/Alertness: Awake/alert Behavior During Therapy: WFL for tasks assessed/performed Overall Cognitive Status: Within Functional Limits for tasks assessed    Extremity/Trunk Assessment Upper Extremity Assessment Upper Extremity Assessment: Overall WFL for tasks assessed Lower Extremity Assessment Lower Extremity Assessment: Generalized weakness     Mobility Bed Mobility Bed Mobility: Left Sidelying to Sit;Sit to Sidelying Left Left Sidelying to Sit: 3: Mod assist;HOB flat;With rails Sit to Sidelying Left: 4: Min  guard;HOB flat;With rail Details for  Bed Mobility Assistance: cues for safe technique Transfers Transfers: Sit to Stand;Stand to Sit Sit to Stand: 4: Min guard;With upper extremity assist;From bed;From toilet Stand to Sit: 4: Min guard;With upper extremity assist;To bed;To toilet Details for Transfer Assistance: cues for safer technique     Exercise     Balance Balance Balance Assessed: No   End of Session OT - End of Session Equipment Utilized During Treatment: Gait belt;Rolling walker;Back brace Activity Tolerance: Patient limited by pain (limited by nausea) Patient left: in bed;with call bell/phone within reach;with nursing/sitter in room;with family/visitor present Nurse Communication: Mobility status;Patient requests pain meds  GO    12/31/2012 Cipriano Mile OTR/L Pager [phone] Office [phone]  Cipriano Mile 12/31/2012, 5:33 PM

## 2013-01-01 MED ORDER — POTASSIUM CHLORIDE CRYS ER 20 MEQ PO TBCR
20.0000 meq | EXTENDED_RELEASE_TABLET | Freq: Once | ORAL | Status: AC
  Administered 2013-01-01: 20 meq via ORAL
  Filled 2013-01-01: qty 1

## 2013-01-01 MED ORDER — HYDROCORTISONE 2.5 % RE CREA
TOPICAL_CREAM | Freq: Two times a day (BID) | RECTAL | Status: DC | PRN
  Administered 2013-01-01: 23:00:00 via RECTAL
  Filled 2013-01-01: qty 28.35

## 2013-01-01 NOTE — Progress Notes (Signed)
Continual C/O of headache.  Positioning does not matter, [name], flat, etc.  Encouraged to manage pain with oral meds due to question of PCA having minimal effect in relieving headache.  Please address her concern re: K+ level of 3.3.

## 2013-01-01 NOTE — Progress Notes (Signed)
Physical Therapy Treatment Patient Details Name: [name] MRN: [id number] DOB: 1961-01-05 Today's Date: 01/01/2013 Time: 8469-6295 PT Time Calculation (min): 30 min  PT Assessment / Plan / Recommendation  History of Present Illness patient seen in my office with lbp with radiation to the left leg for almost one year up to the point that she lost control of the leg, fell and had some rupture ligament of the knee. She had her knee repaired and now wants to go ahead with her lumbar surgery. s/p L5-S1 lami, disk, fusion.   PT Comments   Pt with c/o ms spasms and nausea limiting gait distance.  Follow Up Recommendations  No PT follow up     Does the patient have the potential to tolerate intense rehabilitation     Barriers to Discharge        Equipment Recommendations  Rolling walker with 5" wheels    Recommendations for Other Services    Frequency Min 5X/week   Progress towards PT Goals Progress towards PT goals: Progressing toward goals  Plan Current plan remains appropriate    Precautions / Restrictions Precautions Precautions: Back Precaution Comments: Reviewed 3/3 back precautions. Required Braces or Orthoses: Spinal Brace Spinal Brace: Lumbar corset;Applied in sitting position   Pertinent Vitals/Pain 7/10    Mobility  Bed Mobility Bed Mobility: Not assessed Transfers Sit to Stand: 4: Min guard;From bed;With upper extremity assist;From chair/3-in-1 Stand to Sit: To chair/3-in-1;With armrests;4: Min guard Details for Transfer Assistance: verbal cues for sequencing, increased time required to complete due to pain Ambulation/Gait Ambulation/Gait Assistance: 4: Min guard Ambulation Distance (Feet): 30 Feet Assistive device: Rolling walker Gait Pattern: Step-through pattern;Decreased stride length;Antalgic Gait velocity: decreased    Exercises     PT Diagnosis:    PT Problem List:   PT Treatment Interventions:     PT Goals (current goals can now be found  in the care plan section) Acute Rehab PT Goals Patient Stated Goal: back home and Independent  Visit Information  Last PT Received On: 01/01/13 Assistance Needed: +1 History of Present Illness: patient seen in my office with lbp with radiation to the left leg for almost one year up to the point that she lost control of the leg, fell and had some rupture ligament of the knee. She had her knee repaired and now wants to go ahead with her lumbar surgery. s/p L5-S1 lami, disk, fusion.    Subjective Data  Patient Stated Goal: back home and Independent   Cognition  Cognition Arousal/Alertness: Awake/alert Behavior During Therapy: WFL for tasks assessed/performed Overall Cognitive Status: Within Functional Limits for tasks assessed    Balance     End of Session PT - End of Session Equipment Utilized During Treatment: Gait belt;Back brace Activity Tolerance: Patient tolerated treatment well Patient left: in chair;with call bell/phone within reach Nurse Communication: Mobility status   GP     Ilda Foil 01/01/2013, 3:35 PM  Aida Raider, PT  Office # [phone] Pager [phone]

## 2013-01-01 NOTE — Progress Notes (Signed)
Doing well. C/o appropriate incisional soreness. Feels generally weak when up -  Some HA, not postural,   Some N/V  Temp:  [99.3 F (37.4 C)-100 F (37.8 C)] 99.9 F (37.7 C) (12/28 0539) Pulse Rate:  [80-98] 88 (12/28 0539) Resp:  [13-19] 19 (12/28 0742) BP: (95-112)/(38-62) 108/57 mmHg (12/28 0539) SpO2:  [94 %-100 %] 96 % (12/28 0742) Good strength and sensation Incision CDI  Plan: Increase activity

## 2013-01-01 NOTE — Progress Notes (Signed)
Occupational Therapy Treatment Patient Details Name: [name] MRN: [id number] DOB: 1960-08-12 Today's Date: 01/01/2013 Time: 4098-1191 OT Time Calculation (min): 14 min  OT Assessment / Plan / Recommendation  History of present illness patient seen in my office with lbp with radiation to the left leg for almost one year up to the point that she lost control of the leg, fell and had some rupture ligament of the knee. She had her knee repaired and now wants to go ahead with her lumbar surgery. s/p L5-S1 lami, disk, fusion.   OT comments  Pt progressing toward goals. Reports she feels a little better today. Focus of session on ADL techniques and back precautions.  Follow Up Recommendations  Supervision/Assistance - 24 hour;No OT follow up    Barriers to Discharge       Equipment Recommendations  None recommended by OT    Recommendations for Other Services    Frequency Min 2X/week   Progress towards OT Goals Progress towards OT goals: Progressing toward goals  Plan Discharge plan remains appropriate    Precautions / Restrictions Precautions Precautions: Back Precaution Comments: Reviewed 3/3 back precautions. Required Braces or Orthoses: Spinal Brace Spinal Brace: Lumbar corset   Pertinent Vitals/Pain See vitals    ADL  Lower Body Bathing: Simulated;Supervision/safety Where Assessed - Lower Body Bathing: Supported sitting Lower Body Dressing: Performed;Supervision/safety (don/doff socks) Where Assessed - Lower Body Dressing: Supported sitting Equipment Used: Back brace ADL Comments: Pt up in chair on OT arrival (had just finished working with PT).  Pt is able to cross ankles over knees in order to don/doff socks. Educated her on using this technique for LB ADL tasks. Pt states that she does have 2 reachers and a long handled sponge at home.  Educated pt on home safety and ADL techniques.  Pt states that she purchased a tray for her RW so that she can retrieve and  trasnport ADL items.      OT Diagnosis:    OT Problem List:   OT Treatment Interventions:     OT Goals(current goals can now be found in the care plan section) Acute Rehab OT Goals Patient Stated Goal: back home and Independent OT Goal Formulation: With patient Time For Goal Achievement: 01/07/13 Potential to Achieve Goals: Good ADL Goals Pt Will Perform Lower Body Dressing: with modified independence;sit to/from stand;with adaptive equipment Pt Will Transfer to Toilet: with modified independence;ambulating Pt Will Perform Toileting - Clothing Manipulation and hygiene: with modified independence;sit to/from stand Pt Will Perform Tub/Shower Transfer: with modified independence;Shower transfer;ambulating;shower seat Additional ADL Goal #1: Pt will perform bed mobility at mod I level as precursor for EOB ADLs.  Visit Information  Last OT Received On: 01/01/13 Assistance Needed: +1 History of Present Illness: patient seen in my office with lbp with radiation to the left leg for almost one year up to the point that she lost control of the leg, fell and had some rupture ligament of the knee. She had her knee repaired and now wants to go ahead with her lumbar surgery. s/p L5-S1 lami, disk, fusion.    Subjective Data      Prior Functioning       Cognition  Cognition Arousal/Alertness: Awake/alert Behavior During Therapy: WFL for tasks assessed/performed Overall Cognitive Status: Within Functional Limits for tasks assessed    Mobility  Bed Mobility Bed Mobility: Not assessed Transfers Transfers: Not assessed    Exercises      Balance     End of Session  OT - End of Session Equipment Utilized During Treatment: Back brace Activity Tolerance: Patient tolerated treatment well Patient left: in chair;with call bell/phone within reach;with family/visitor present Nurse Communication: Mobility status  GO    01/01/2013 Cipriano Mile OTR/L Pager [phone] Office  [phone]  Cipriano Mile 01/01/2013, 2:47 PM

## 2013-01-01 NOTE — Progress Notes (Signed)
Patient & spouse concerned that K+ level low, on call Dr. Franky Macho paged with message re: abnormal lab results.

## 2013-01-02 LAB — BASIC METABOLIC PANEL
BUN: 7 mg/dL (ref 6–23)
CO2: 31 mEq/L (ref 19–32)
Calcium: 9 mg/dL (ref 8.4–10.5)
Chloride: 102 mEq/L (ref 96–112)
Creatinine, Ser: 0.55 mg/dL (ref 0.50–1.10)
GFR calc Af Amer: >90 mL/min (ref >90)
GFR calc non Af Amer: >90 mL/min (ref >90)
Glucose, Bld: 111 mg/dL — ABNORMAL HIGH (ref 70–99)
Potassium: 3.6 mEq/L (ref 3.5–5.1)
Sodium: 140 mEq/L (ref 135–145)

## 2013-01-02 MED FILL — Sodium Chloride IV Soln 0.9%: INTRAVENOUS | Qty: 1000 | Status: AC

## 2013-01-02 MED FILL — Heparin Sodium (Porcine) Inj 1000 Unit/ML: INTRAMUSCULAR | Qty: 30 | Status: AC

## 2013-01-02 NOTE — Progress Notes (Signed)
UR complete.  Alliya Marcon RN, MSN 

## 2013-01-02 NOTE — Progress Notes (Signed)
Physical Therapy Treatment Patient Details Name: [name] MRN: [id number] DOB: 12/05/1960 Today's Date: 01/02/2013 Time: 4098-1191 PT Time Calculation (min): 41 min  PT Assessment / Plan / Recommendation  History of Present Illness pt with L5-S1 Lami and Fusion.     PT Comments   Pt much improved mobility today.  Pt able to perform step as she will to enter home and step stool to enter tall bed.  Will add HHPT for home safety at D/C.    Follow Up Recommendations  Home health PT;Supervision - Intermittent     Does the patient have the potential to tolerate intense rehabilitation     Barriers to Discharge        Equipment Recommendations  Rolling walker with 5" wheels    Recommendations for Other Services    Frequency Min 5X/week   Progress towards PT Goals Progress towards PT goals: Progressing toward goals  Plan Discharge plan needs to be updated    Precautions / Restrictions Precautions Precautions: Back Precaution Comments: pt verbalized 3/3 back precautions.   Required Braces or Orthoses: Spinal Brace Spinal Brace: Lumbar corset;Applied in sitting position Restrictions Weight Bearing Restrictions: No   Pertinent Vitals/Pain 4-5/10 with mobility.      Mobility  Bed Mobility Bed Mobility: Left Sidelying to Sit;Sitting - Scoot to Edge of Bed Left Sidelying to Sit: 5: Supervision;HOB flat Sitting - Scoot to Edge of Bed: 5: Supervision Details for Bed Mobility Assistance: pt needs increased time and encouragement to complete without A.   Transfers Transfers: Sit to Stand;Stand to Sit Sit to Stand: 4: Min guard;With upper extremity assist;From bed Stand to Sit: 4: Min guard;With upper extremity assist;To chair/3-in-1 Details for Transfer Assistance: cues for UE use.  pt requires increased time to complete.   Ambulation/Gait Ambulation/Gait Assistance: 5: Supervision Ambulation Distance (Feet): 300 Feet Assistive device: Rolling walker Ambulation/Gait  Assistance Details: pt moves slowly, but without physical A.  demos good use of RW and upright posture.   Gait Pattern: Step-through pattern;Decreased stride length Stairs: Yes Stairs Assistance: 4: Min guard Stair Management Technique: No rails;Step to pattern;Backwards;With walker Number of Stairs: 1 (x2) Wheelchair Mobility Wheelchair Mobility: No    Exercises     PT Diagnosis:    PT Problem List:   PT Treatment Interventions:     PT Goals (current goals can now be found in the care plan section) Acute Rehab PT Goals Patient Stated Goal: back home and Independent Time For Goal Achievement: 01/07/13 Potential to Achieve Goals: Good  Visit Information  Last PT Received On: 01/02/13 Assistance Needed: +1 History of Present Illness: pt with L5-S1 Lami and Fusion.      Subjective Data  Patient Stated Goal: back home and Independent   Cognition  Cognition Arousal/Alertness: Awake/alert Behavior During Therapy: WFL for tasks assessed/performed Overall Cognitive Status: Within Functional Limits for tasks assessed    Balance  Balance Balance Assessed: No  End of Session PT - End of Session Equipment Utilized During Treatment: Gait belt;Back brace Activity Tolerance: Patient tolerated treatment well Patient left: in chair;with call bell/phone within reach Nurse Communication: Mobility status   GP     [name],  [phone] 01/02/2013, 10:55 AM

## 2013-01-02 NOTE — Progress Notes (Signed)
Patient ID: [name], female   DOB: 09-10-1960, 52 y.o.   MRN: [id number] Subjective: Patient reports some N/V and a generalized weakness, no focal weakness, no leg pain, appropriate back pain.  Objective: Vital signs in last 24 hours: Temp:  [98.5 F (36.9 C)-99.7 F (37.6 C)] 99.7 F (37.6 C) (12/29 0612) Pulse Rate:  [81-102] 91 (12/29 0612) Resp:  [9-19] 9 (12/29 0612) BP: (93-112)/(54-67) 112/60 mmHg (12/29 0612) SpO2:  [96 %-100 %] 100 % (12/29 0612)  Intake/Output from previous day: 12/28 0701 - 12/29 0700 In: 630 [P.O.:630] Out: -  Intake/Output this shift:    Neurologic: Grossly normal  Lab Results: Lab Results  Component Value Date   WBC 11.5* 12/31/2012   HGB 11.5* 12/31/2012   HCT 33.5* 12/31/2012   MCV 90.3 12/31/2012   PLT 175 12/31/2012   No results found for this basename: INR, PROTIME   BMET Lab Results  Component Value Date   NA 143 12/31/2012   K 3.3* 12/31/2012   CL 105 12/31/2012   CO2 29 12/31/2012   GLUCOSE 135* 12/31/2012   BUN 4* 12/31/2012   CREATININE 0.62 12/31/2012   CALCIUM 8.6 12/31/2012    Studies/Results: No results found.  Assessment/Plan: Doing ok, continue pain control and PT.   LOS: 3 days    Welford Christmas S 01/02/2013, 7:59 AM    '

## 2013-01-02 NOTE — Progress Notes (Signed)
Occupational Therapy Treatment Patient Details Name: [name] MRN: [id number] DOB: [birth year] Today's Date: 01/02/2013 Time: 6578-4696 OT Time Calculation (min): 39 min  OT Assessment / Plan / Recommendation  History of present illness pt with L5-S1 Lami and Fusion.     OT comments  Pt performed UB/LB dressing, toileting, grooming, and OT provided education to pt and family.   Follow Up Recommendations  Supervision/Assistance - 24 hour;No OT follow up    Barriers to Discharge       Equipment Recommendations  None recommended by OT    Recommendations for Other Services    Frequency Min 2X/week   Progress towards OT Goals Progress towards OT goals: Progressing toward goals  Plan Discharge plan remains appropriate    Precautions / Restrictions Precautions Precautions: Back Precaution Comments: pt verbalized 3/3 back precautions.   Required Braces or Orthoses: Spinal Brace Spinal Brace: Lumbar corset;Applied in sitting position   Pertinent Vitals/Pain Pain 5/10. Repositioned.     ADL  Grooming: Teeth care;Set up;Supervision/safety Where Assessed - Grooming: Supported standing Upper Body Dressing: Set up;Supervision/safety Where Assessed - Upper Body Dressing: Unsupported sitting Lower Body Dressing: Set up;Supervision/safety Where Assessed - Lower Body Dressing: Supported sit to stand Toilet Transfer: Min Pension scheme manager Method: Sit to Barista: Raised toilet seat with arms (or 3-in-1 over toilet) Toileting - Clothing Manipulation and Hygiene: Moderate assistance (performed hygiene in front-broke precautions and simulated hygiene in back and was twisting) Where Assessed - Toileting Clothing Manipulation and Hygiene: Sit to stand from 3-in-1 or toilet Equipment Used: Back brace;Gait belt;Rolling walker;Other (comment) (showed toilet aid) Transfers/Ambulation Related to ADLs: Min guard ADL Comments: Educated on use of bag on walker-pt  states she has something and OT educated to use this for safety. Cues for precautions during grooming at sink. Educated on use of two cups for teeth care and placing items on right side of sink to avoid breaking precautions. Educated on tub transfer techniques-Pt found pic of her shower on her phone and showed OT and it does not appear that walker will fit in there with doors, so showed her tub transfer options.  Cues for precautions during UB/LB dressing.    OT Diagnosis:    OT Problem List:   OT Treatment Interventions:     OT Goals(current goals can now be found in the care plan section) Acute Rehab OT Goals Patient Stated Goal: not stated OT Goal Formulation: With patient Time For Goal Achievement: 01/07/13 Potential to Achieve Goals: Good ADL Goals Pt Will Perform Grooming: with modified independence;standing Pt Will Perform Lower Body Dressing: with modified independence;sit to/from stand;with adaptive equipment Pt Will Transfer to Toilet: with modified independence;ambulating Pt Will Perform Toileting - Clothing Manipulation and hygiene: with modified independence;sit to/from stand Pt Will Perform Tub/Shower Transfer: with modified independence;Shower transfer;ambulating;shower seat Additional ADL Goal #1: Pt will perform bed mobility at mod I level as precursor for EOB ADLs.  Visit Information  Last OT Received On: 01/02/13 Assistance Needed: +1 History of Present Illness: pt with L5-S1 Lami and Fusion.      Subjective Data      Prior Functioning       Cognition  Cognition Arousal/Alertness: Awake/alert Behavior During Therapy: WFL for tasks assessed/performed Overall Cognitive Status: Within Functional Limits for tasks assessed    Mobility  Bed Mobility Bed Mobility: Rolling Right;Rolling Left;Left Sidelying to Sit Rolling Right: 3: Mod assist Rolling Left: 5: Supervision Left Sidelying to Sit: 4: Min guard Details for Bed  Mobility Assistance: Assistance with legs  when rolling onto back.  Cues for precautions. Transfers Transfers: Sit to Stand;Stand to Sit Sit to Stand: 4: Min guard;4: Min assist;With upper extremity assist;From bed;From chair/3-in-1 Stand to Sit: 4: Min guard;To chair/3-in-1;To bed Details for Transfer Assistance: Min A to stand from bed. Cues for technique.    Exercises      Balance     End of Session OT - End of Session Equipment Utilized During Treatment: Gait belt;Rolling walker;Back brace Activity Tolerance: Patient tolerated treatment well Patient left: in bed;with call bell/phone within reach;with family/visitor present  GO     Earlie Raveling OTR/L [phone] 01/02/2013, 5:38 PM

## 2013-01-03 NOTE — Progress Notes (Signed)
Came to visit patient on behalf of [employer] program for [employer] employees/dependents with [employer]. Left brochure and contact information. No identifiable needs at this time.  Raiford Noble, MSN-Ed, RN,BSN- [facility] [phone]

## 2013-01-03 NOTE — Progress Notes (Signed)
Physical Therapy Treatment Patient Details Name: [name] MRN: [id number] DOB: November 14, 1960 Today's Date: 01/03/2013 Time: 4098-1191 PT Time Calculation (min): 36 min  PT Assessment / Plan / Recommendation  History of Present Illness pt with L5-S1 Lami and Fusion.     PT Comments   Pt continues to move well, only c/o muscle spasms.  Will continue to follow.    Follow Up Recommendations  Home health PT;Supervision - Intermittent     Does the patient have the potential to tolerate intense rehabilitation     Barriers to Discharge        Equipment Recommendations  Rolling walker with 5" wheels    Recommendations for Other Services    Frequency Min 5X/week   Progress towards PT Goals Progress towards PT goals: Progressing toward goals  Plan Current plan remains appropriate    Precautions / Restrictions Precautions Precautions: Back Precaution Comments: pt verbalized 3/3 back precautions.   Required Braces or Orthoses: Spinal Brace Spinal Brace: Lumbar corset;Applied in sitting position Restrictions Weight Bearing Restrictions: No   Pertinent Vitals/Pain Indicates muscle spasms.  Premedicated.      Mobility  Bed Mobility Bed Mobility: Left Sidelying to Sit;Sit to Sidelying Left;Sitting - Scoot to Edge of Bed Left Sidelying to Sit: 5: Supervision;HOB flat Sitting - Scoot to Edge of Bed: 5: Supervision Sit to Sidelying Left: 5: Supervision;HOB elevated Transfers Transfers: Sit to Stand;Stand to Sit Sit to Stand: 4: Min guard;With upper extremity assist;From bed;From chair/3-in-1 Stand to Sit: 4: Min guard;With upper extremity assist;To chair/3-in-1;To bed Details for Transfer Assistance: Demos good technique.   Ambulation/Gait Ambulation/Gait Assistance: 5: Supervision Ambulation Distance (Feet): 200 Feet Assistive device: Rolling walker Ambulation/Gait Assistance Details: pt continues to demo good use of RW and safety with ambulation.   Gait Pattern:  Step-through pattern;Decreased stride length Stairs: No Wheelchair Mobility Wheelchair Mobility: No    Exercises     PT Diagnosis:    PT Problem List:   PT Treatment Interventions:     PT Goals (current goals can now be found in the care plan section) Acute Rehab PT Goals Patient Stated Goal: not stated Time For Goal Achievement: 01/07/13 Potential to Achieve Goals: Good  Visit Information  Last PT Received On: 01/03/13 Assistance Needed: +1 History of Present Illness: pt with L5-S1 Lami and Fusion.      Subjective Data  Patient Stated Goal: not stated   Cognition  Cognition Arousal/Alertness: Awake/alert Behavior During Therapy: WFL for tasks assessed/performed Overall Cognitive Status: Within Functional Limits for tasks assessed    Balance  Balance Balance Assessed: No  End of Session PT - End of Session Equipment Utilized During Treatment: Gait belt;Back brace Activity Tolerance: Patient tolerated treatment well Patient left: in bed;with call bell/phone within reach Nurse Communication: Mobility status   GP     [name], [place] [phone] 01/03/2013, 12:01 PM

## 2013-01-03 NOTE — Progress Notes (Signed)
Patient's son [name], visited her around 2045 and stayed for an hour. Nurse asked about current signs and symptoms of illness and denied any symptoms. Educated about good hand hygiene, he agreed to do so and stated back. Also told not to visit any other patient rooms in the hospital and agreed.

## 2013-01-03 NOTE — Progress Notes (Signed)
Occupational Therapy Treatment Patient Details Name: [name] MRN: [id number] DOB: 1960/10/19 Today's Date: 01/03/2013 Time: 0912-0936 OT Time Calculation (min): 24 min  OT Assessment / Plan / Recommendation  History of present illness pt with L5-S1 Lami and Fusion.     OT comments  Provided education to pt. Educated and demonstrated car transfer technique and pt practiced shower transfer.   Follow Up Recommendations  Supervision/Assistance - 24 hour;No OT follow up    Barriers to Discharge       Equipment Recommendations  None recommended by OT    Recommendations for Other Services    Frequency Min 2X/week   Progress towards OT Goals Progress towards OT goals: Progressing toward goals  Plan Discharge plan remains appropriate    Precautions / Restrictions Precautions Precautions: Back Precaution Comments: pt verbalized 3/3 back precautions.   Required Braces or Orthoses: Spinal Brace Spinal Brace: Lumbar corset;Applied in sitting position Restrictions Weight Bearing Restrictions: No   Pertinent Vitals/Pain Pain 8/10 in legs. Nurse notified.     ADL  Upper Body Dressing: Set up Where Assessed - Upper Body Dressing: Unsupported sitting (back brace) Toilet Transfer: Min guard Toilet Transfer Method: Sit to Barista: Other (comment) (from bed) Tub/Shower Transfer: Minimal assistance Tub/Shower Transfer Method: Science writer: Shower seat with back Equipment Used: Back brace;Gait belt;Rolling walker Transfers/Ambulation Related to ADLs: Min guard for ambulation and sit <> stand transfer. Min A for shower transfer. ADL Comments: Reviewed information for ADLs. Educated on car transfer technique and practiced shower transfer technique. Reviewed to be careful not to raise arms too high when she dons shirts (button ups or large/stretchy shirt may be helpful).  Recommended sitting to bathe and for dressing and to have  someone with her when showering and getting in and out of shower. Told pt to practice shower transfer with [facility] therapist before doing it on her own.    OT Diagnosis:    OT Problem List:   OT Treatment Interventions:     OT Goals(current goals can now be found in the care plan section) Acute Rehab OT Goals Patient Stated Goal: not stated OT Goal Formulation: With patient Time For Goal Achievement: 01/07/13 Potential to Achieve Goals: Good ADL Goals Pt Will Perform Grooming: with modified independence;standing Pt Will Perform Lower Body Dressing: with modified independence;sit to/from stand;with adaptive equipment Pt Will Transfer to Toilet: with modified independence;ambulating Pt Will Perform Toileting - Clothing Manipulation and hygiene: with modified independence;sit to/from stand Pt Will Perform Tub/Shower Transfer: with modified independence;Shower transfer;ambulating;shower seat Additional ADL Goal #1: Pt will perform bed mobility at mod I level as precursor for EOB ADLs.  Visit Information  Last OT Received On: 01/03/13 Assistance Needed: +1 History of Present Illness: pt with L5-S1 Lami and Fusion.      Subjective Data      Prior Functioning       Cognition  Cognition Arousal/Alertness: Awake/alert Behavior During Therapy: WFL for tasks assessed/performed Overall Cognitive Status: Within Functional Limits for tasks assessed    Mobility  Bed Mobility Bed Mobility: Left Sidelying to Sit;Sit to Sidelying Left Left Sidelying to Sit: 5: Supervision Sit to Sidelying Left: 5: Supervision Transfers Transfers: Sit to Stand;Stand to Sit Sit to Stand: 4: Min guard;With upper extremity assist;From bed;From chair/3-in-1 Stand to Sit: 4: Min guard;With upper extremity assist;To chair/3-in-1;To bed Details for Transfer Assistance: Min guard for safety. OT did act as assist on right side when practicing shower transfer  for stand to  sit transfer to simulate grab bar.     Exercises      Balance     End of Session OT - End of Session Equipment Utilized During Treatment: Gait belt;Rolling walker;Back brace Activity Tolerance: Patient tolerated treatment well Patient left: in bed;with call bell/phone within reach Nurse Communication: Other (comment) (pain level)  GO     Earlie Raveling OTR/L [phone] 01/03/2013, 9:57 AM

## 2013-01-03 NOTE — Progress Notes (Signed)
Dr August Saucer called IP to ask if his 52 year old son could be permitted to visit his mom in room 4N08C tonight and possibly tomorrow; the son will be caring for his mom at home. I called and spoke with Gust Brooms, RN and asked her to review this progress note at handoff.  If the son visits, please ask about any signs and symptoms of current illness, educate him on good hand hygiene, and ask him to only visit in his mom's room. For any questions please call me on the IP call phone @ [phone].

## 2013-01-04 MED ORDER — DIAZEPAM 5 MG PO TABS: 5.0000 mg | ORAL_TABLET | Freq: Four times a day (QID) | ORAL | Status: DC | PRN

## 2013-01-04 MED ORDER — HYDROCODONE-ACETAMINOPHEN 10-325 MG PO TABS: 1.0000 | ORAL_TABLET | ORAL | Status: DC | PRN

## 2013-01-04 NOTE — Progress Notes (Signed)
Physical Therapy Treatment Patient Details Name: MIREYAH CHERVENAK MRN: [id number] DOB: Jun 18, 1960 Today's Date: 01/04/2013 Time: 7829-5621 PT Time Calculation (min): 25 min  PT Assessment / Plan / Recommendation  History of Present Illness pt with L5-S1 Lami and Fusion.     PT Comments   Patient progressing well with mobility. Complaining of increased leg pain and spasms this morning but did state they were better after ambulating. Patient is planning to DC home today per her report. Will have assistance from her son at home  Follow Up Recommendations  Home health PT;Supervision - Intermittent     Does the patient have the potential to tolerate intense rehabilitation     Barriers to Discharge        Equipment Recommendations  Rolling walker with 5" wheels    Recommendations for Other Services    Frequency Min 5X/week   Progress towards PT Goals Progress towards PT goals: Progressing toward goals  Plan Current plan remains appropriate    Precautions / Restrictions Precautions Precautions: Back Precaution Comments: pt verbalized 3/3 back precautions.   Required Braces or Orthoses: Spinal Brace Spinal Brace: Lumbar corset;Applied in sitting position Restrictions Weight Bearing Restrictions: No   Pertinent Vitals/Pain Complained of leg pain. premedicated   Mobility  Bed Mobility Rolling Left: 6: Modified independent (Device/Increase time) Left Sidelying to Sit: 6: Modified independent (Device/Increase time) Sitting - Scoot to Edge of Bed: 6: Modified independent (Device/Increase time) Transfers Sit to Stand: 5: Supervision Stand to Sit: 5: Supervision Details for Transfer Assistance: Demos good technique.   Ambulation/Gait Ambulation/Gait Assistance: 5: Supervision Ambulation Distance (Feet): 250 Feet Assistive device: Rolling walker Ambulation/Gait Assistance Details: demos good technique. Required a couple of rest breaks due to increased leg pain Gait Pattern:  Step-through pattern;Decreased stride length Stairs Assistance: 4: Min guard Stair Management Technique: Two rails;No rails;Step to pattern;Forwards;With walker Number of Stairs: 4 (3 with rails; 1 with RW)    Exercises     PT Diagnosis:    PT Problem List:   PT Treatment Interventions:     PT Goals (current goals can now be found in the care plan section)    Visit Information  Last PT Received On: 01/04/13 Assistance Needed: +1 History of Present Illness: pt with L5-S1 Lami and Fusion.      Subjective Data      Cognition  Cognition Arousal/Alertness: Awake/alert Behavior During Therapy: WFL for tasks assessed/performed Overall Cognitive Status: Within Functional Limits for tasks assessed    Balance     End of Session PT - End of Session Equipment Utilized During Treatment: Gait belt;Back brace Activity Tolerance: Patient tolerated treatment well Patient left: in bed;with call bell/phone within reach Nurse Communication: Mobility status   GP     Fredrich Birks 01/04/2013, 11:19 AM 01/04/2013 Fredrich Birks PTA [phone] pager [phone] office

## 2013-01-04 NOTE — Care Management Note (Unsigned)
    Page 1 of 1   01/04/2013     12:57:49 PM   CARE MANAGEMENT NOTE 01/04/2013  Patient:  [name], [name]   Account Number:  [id number]  Date Initiated:  01/03/2013  Documentation initiated by:  Elmer Bales  Subjective/Objective Assessment:   Patient admitted for bilateral lumbar laminectomy. Lives at home with husband     Action/Plan:   Will follow for discharge needs.   Anticipated DC Date:  01/04/2013   Anticipated DC Plan:  HOME/SELF CARE      DC Planning Services  CM consult      Choice offered to / List presented to:             Status of service:  In process, will continue to follow Medicare Important Message given?   (If response is "NO", the following Medicare IM given date fields will be blank) Date Medicare IM given:   Date Additional Medicare IM given:    Discharge Disposition:    Per UR Regulation:    If discussed at Long Length of Stay Meetings, dates discussed:    Comments:  01/04/13 1230 Elmer Bales RN, MSN, CM- Met with patient to discuss discharge planning. CM explained that Dr Yetta Barre has not yet called back regarding home health orders. If home health therapy is ordered, patient has chosen [facility].  Voicemail was left for Hilda Lias with [facility] to notify that patient may require their services.  Recieved a call back from Darlin Drop with [facility], who states she will monitor the chart for any new orders.  Patient's RN Cala Bradford was notified of discharge situation and tentative plan.  Patient states that she already has a walker at home, so there are no DME needs at this time.   01/04/13 1145 Elmer Bales RN, MSN, CM-  Spoke with Crystal at the Neurosurgery office to discuss home health plans for patient.  PT is recommending HH with rolling walker.  Crystal to contact Dr Yetta Barre, who is rounding in Dr Cassandria Santee abscence.  Awaiting return call.

## 2013-01-04 NOTE — Discharge Summary (Signed)
Physician Discharge Summary  Patient ID: [name] MRN: [id number] DOB/AGE: [birth year] 52 y.o.  Admit date: 12/30/2012 Discharge date: 01/04/2013  Admission Diagnoses: lumbar stenosis   Discharge Diagnoses: same   Discharged Condition: good  Hospital Course: The patient was admitted on 12/30/2012 and taken to the operating room where the patient underwent lumbar fusion. The patient tolerated the procedure well and was taken to the recovery room and then to the floor in stable condition. The hospital course was routine. There were no complications. The wound remained clean dry and intact. Pt had appropriate back soreness. No complaints of leg pain or new N/T/W. The patient remained afebrile with stable vital signs, and tolerated a regular diet. The patient continued to increase activities, and pain was well controlled with oral pain medications.   Consults: None  Significant Diagnostic Studies:  Results for orders placed during the hospital encounter of 12/30/12  CBC      Result Value Range   WBC 11.5 (*) 4.0 - 10.5 K/uL   RBC 3.71 (*) 3.87 - 5.11 MIL/uL   Hemoglobin 11.5 (*) 12.0 - 15.0 g/dL   HCT 56.2 (*) 13.0 - 86.5 %   MCV 90.3  78.0 - 100.0 fL   MCH 31.0  26.0 - 34.0 pg   MCHC 34.3  30.0 - 36.0 g/dL   RDW 78.4  69.6 - 29.5 %   Platelets 175  150 - 400 K/uL  COMPREHENSIVE METABOLIC PANEL      Result Value Range   Sodium 143  135 - 145 mEq/L   Potassium 3.3 (*) 3.5 - 5.1 mEq/L   Chloride 105  96 - 112 mEq/L   CO2 29  19 - 32 mEq/L   Glucose, Bld 135 (*) 70 - 99 mg/dL   BUN 4 (*) 6 - 23 mg/dL   Creatinine, Ser 2.84  0.50 - 1.10 mg/dL   Calcium 8.6  8.4 - 13.2 mg/dL   Total Protein 6.8  6.0 - 8.3 g/dL   Albumin 3.3 (*) 3.5 - 5.2 g/dL   AST 35  0 - 37 U/L   ALT 22  0 - 35 U/L   Alkaline Phosphatase 59  39 - 117 U/L   Total Bilirubin 0.8  0.3 - 1.2 mg/dL   GFR calc non Af Amer >90  >90 mL/min   GFR calc Af Amer >90  >90 mL/min  BASIC METABOLIC PANEL   Result Value Range   Sodium 140  135 - 145 mEq/L   Potassium 3.6  3.5 - 5.1 mEq/L   Chloride 102  96 - 112 mEq/L   CO2 31  19 - 32 mEq/L   Glucose, Bld 111 (*) 70 - 99 mg/dL   BUN 7  6 - 23 mg/dL   Creatinine, Ser 4.40  0.50 - 1.10 mg/dL   Calcium 9.0  8.4 - 10.2 mg/dL   GFR calc non Af Amer >90  >90 mL/min   GFR calc Af Amer >90  >90 mL/min    Dg Lumbar Spine 2-3 Views  12/30/2012   CLINICAL DATA:  Lumbar disc protrusion.  EXAM: LUMBAR SPINE - 2-3 VIEW; DG C-ARM 1-60 MIN  COMPARISON:  MRI dated 09/21/2012  FINDINGS: AP and lateral C-arm images demonstrate the patient undergoing interbody and posterior fusion at L5-S1 with posterior decompression.  IMPRESSION: Interbody and posterior fusion performed at L5-S1.   Electronically Signed   By: Geanie Cooley M.D.   On: 12/30/2012 11:35   Dg Lumbar Spine 2-3  Views  12/30/2012   CLINICAL DATA:  Low back pain.  EXAM: LUMBAR SPINE - 2-3 VIEW  COMPARISON:  MRI 09/21/2012.  FINDINGS: Film 1 demonstrates tissue spreaders dorsally at L5.  Film 2 demonstrates an angled probe directed most closely toward the upper aspect of S1.  Film 3 demonstrates a blunt probe directed most closely toward L5-S1.  IMPRESSION: As above.   Electronically Signed   By: Davonna Belling M.D.   On: 12/30/2012 09:59   Dg C-arm 1-60 Min  12/30/2012   CLINICAL DATA:  Lumbar disc protrusion.  EXAM: LUMBAR SPINE - 2-3 VIEW; DG C-ARM 1-60 MIN  COMPARISON:  MRI dated 09/21/2012  FINDINGS: AP and lateral C-arm images demonstrate the patient undergoing interbody and posterior fusion at L5-S1 with posterior decompression.  IMPRESSION: Interbody and posterior fusion performed at L5-S1.   Electronically Signed   By: Geanie Cooley M.D.   On: 12/30/2012 11:35    Antibiotics:  Anti-infectives   Start     Dose/Rate Route Frequency Ordered Stop   12/30/12 1600  ceFAZolin (ANCEF) IVPB 1 g/50 mL premix     1 g 100 mL/hr over 30 Minutes Intravenous Every 8 hours 12/30/12 1322 12/31/12 0648    12/30/12 0600  ceFAZolin (ANCEF) IVPB 2 g/50 mL premix     2 g 100 mL/hr over 30 Minutes Intravenous On call to O.R. 12/29/12 1231 12/30/12 0805      Discharge Exam: Blood pressure 100/64, pulse 93, temperature 98.6 F (37 C), temperature source Oral, resp. rate 18, height 5\' 6"  (1.676 m), weight 78.155 kg (172 lb 4.8 oz), SpO2 97.00%. Neurologic: Grossly normal Incision cdi  Discharge Medications:     Medication List         ATELVIA 35 MG Tbec  Generic drug:  Risedronate Sodium  Take 1 tablet by mouth once a week. Taken on Wednesdays.     B-complex with vitamin C tablet  Take 1 tablet by mouth daily.     Calcium-Magnesium-Zinc 333-133-5 MG Tabs  Take 1 tablet by mouth 3 (three) times daily.     cetirizine 10 MG tablet  Commonly known as:  ZYRTEC  Take 10 mg by mouth at bedtime.     Coconut Oil 1000 MG Caps  Take 1,000 mg by mouth 2 (two) times daily.     colchicine 0.6 MG tablet  Take 1 tablet (0.6 mg total) by mouth 2 (two) times daily.     diazepam 5 MG tablet  Commonly known as:  VALIUM  Take 1 tablet (5 mg total) by mouth every 6 (six) hours as needed for muscle spasms.     HYDROcodone-acetaminophen 10-325 MG per tablet  Commonly known as:  NORCO  Take 1-2 tablets by mouth every 4 (four) hours as needed for moderate pain.     lansoprazole 15 MG capsule  Commonly known as:  PREVACID  Take 15 mg by mouth daily at 12 noon.     multivitamin with minerals Tabs tablet  Take 1 tablet by mouth daily.     oxybutynin 5 MG tablet  Commonly known as:  DITROPAN  Take 5 mg by mouth 3 (three) times daily.     predniSONE 5 MG tablet  Commonly known as:  DELTASONE  Take 5 mg by mouth daily with breakfast.        Disposition: home   Final Dx: lumbar fusion      Discharge Orders   Future Appointments Provider Department Dept Phone   01/31/2013  10:00 AM York Spaniel, MD [facility] [phone]   Future Orders Complete By Expires   Call  MD for:  difficulty breathing, headache or visual disturbances  As directed    Call MD for:  persistant nausea and vomiting  As directed    Call MD for:  redness, tenderness, or signs of infection (pain, swelling, redness, odor or green/yellow discharge around incision site)  As directed    Call MD for:  severe uncontrolled pain  As directed    Call MD for:  temperature >100.4  As directed    Diet - low sodium heart healthy  As directed    Discharge instructions  As directed    Comments:     May shower, no strenuous activity, no bending or twisting   Increase activity slowly  As directed       Follow-up Information   Follow up with Karn Cassis, MD. Schedule an appointment as soon as possible for a visit in 2 weeks.   Specialty:  Neurosurgery   Contact information:   [address] [place] [address] [phone]        Signed: Tia Alert 01/04/2013, 2:49 PM

## 2013-01-05 NOTE — Progress Notes (Signed)
Patient referred to CSW for SNF placement. Patient discussed with RNCM and PT notes reviewed- recommendation of home health at d/c.  RNCM completed d/c. CSW signing off.  Lorri Frederick. [place]  [phone]

## 2013-01-23 ENCOUNTER — Telehealth: Payer: Self-pay | Admitting: Neurology

## 2013-01-23 NOTE — Telephone Encounter (Signed)
I called patient. I had a computer reminder to get an MRI the brain to followup in one done in the summer. The patient indicates that she has had some medication adjustments, and she feels better. The patient also had low back surgery. We will hold off on getting the MRI at this point.

## 2013-01-31 ENCOUNTER — Ambulatory Visit: Payer: 59 | Admitting: Neurology

## 2013-03-05 HISTORY — PX: COLONOSCOPY: SHX174

## 2013-03-17 ENCOUNTER — Ambulatory Visit: Payer: 59 | Attending: Orthopedic Surgery | Admitting: Physical Therapy

## 2013-03-17 DIAGNOSIS — M25559 Pain in unspecified hip: Secondary | ICD-10-CM | POA: Insufficient documentation

## 2013-03-17 DIAGNOSIS — M25569 Pain in unspecified knee: Secondary | ICD-10-CM | POA: Insufficient documentation

## 2013-03-17 DIAGNOSIS — IMO0001 Reserved for inherently not codable concepts without codable children: Secondary | ICD-10-CM | POA: Insufficient documentation

## 2013-03-21 ENCOUNTER — Ambulatory Visit: Payer: 59 | Admitting: Rehabilitation

## 2013-03-23 ENCOUNTER — Ambulatory Visit: Payer: 59 | Admitting: Rehabilitation

## 2013-03-28 ENCOUNTER — Ambulatory Visit: Payer: 59 | Admitting: Physical Therapy

## 2013-03-31 ENCOUNTER — Ambulatory Visit: Payer: 59 | Admitting: Physical Therapy

## 2013-04-04 ENCOUNTER — Ambulatory Visit: Payer: 59 | Admitting: Rehabilitation

## 2013-04-06 ENCOUNTER — Ambulatory Visit: Payer: 59 | Attending: Orthopedic Surgery | Admitting: Physical Therapy

## 2013-04-06 DIAGNOSIS — M25569 Pain in unspecified knee: Secondary | ICD-10-CM | POA: Insufficient documentation

## 2013-04-06 DIAGNOSIS — IMO0001 Reserved for inherently not codable concepts without codable children: Secondary | ICD-10-CM | POA: Insufficient documentation

## 2013-04-06 DIAGNOSIS — M25559 Pain in unspecified hip: Secondary | ICD-10-CM | POA: Insufficient documentation

## 2013-04-11 ENCOUNTER — Ambulatory Visit: Payer: 59 | Admitting: Rehabilitation

## 2013-04-14 ENCOUNTER — Ambulatory Visit: Payer: 59 | Admitting: Physical Therapy

## 2013-04-18 ENCOUNTER — Ambulatory Visit: Payer: 59 | Admitting: Physical Therapy

## 2013-04-21 ENCOUNTER — Ambulatory Visit: Payer: 59 | Admitting: Physical Therapy

## 2013-04-25 ENCOUNTER — Ambulatory Visit: Payer: 59 | Admitting: Physical Therapy

## 2013-04-25 ENCOUNTER — Encounter: Payer: 59 | Admitting: Physical Therapy

## 2013-04-28 ENCOUNTER — Ambulatory Visit: Payer: 59 | Admitting: Physical Therapy

## 2013-05-02 ENCOUNTER — Ambulatory Visit: Payer: 59 | Admitting: Physical Therapy

## 2013-05-05 ENCOUNTER — Encounter: Payer: 59 | Admitting: Physical Therapy

## 2013-05-09 ENCOUNTER — Ambulatory Visit: Payer: 59 | Attending: Internal Medicine | Admitting: Physical Therapy

## 2013-05-09 DIAGNOSIS — M25559 Pain in unspecified hip: Secondary | ICD-10-CM | POA: Insufficient documentation

## 2013-05-09 DIAGNOSIS — IMO0001 Reserved for inherently not codable concepts without codable children: Secondary | ICD-10-CM | POA: Insufficient documentation

## 2013-05-09 DIAGNOSIS — M25569 Pain in unspecified knee: Secondary | ICD-10-CM | POA: Insufficient documentation

## 2013-05-12 ENCOUNTER — Ambulatory Visit: Payer: 59 | Admitting: Physical Therapy

## 2013-05-23 ENCOUNTER — Ambulatory Visit: Payer: 59 | Admitting: Physical Therapy

## 2013-05-26 ENCOUNTER — Ambulatory Visit: Payer: 59 | Admitting: Physical Therapy

## 2013-05-30 ENCOUNTER — Ambulatory Visit: Payer: 59 | Admitting: Physical Therapy

## 2013-06-02 ENCOUNTER — Ambulatory Visit: Payer: 59 | Admitting: Physical Therapy

## 2013-06-05 ENCOUNTER — Ambulatory Visit: Payer: 59 | Attending: Internal Medicine | Admitting: Rehabilitation

## 2013-06-05 DIAGNOSIS — M25569 Pain in unspecified knee: Secondary | ICD-10-CM | POA: Insufficient documentation

## 2013-06-05 DIAGNOSIS — IMO0001 Reserved for inherently not codable concepts without codable children: Secondary | ICD-10-CM | POA: Insufficient documentation

## 2013-06-05 DIAGNOSIS — M25559 Pain in unspecified hip: Secondary | ICD-10-CM | POA: Insufficient documentation

## 2013-06-13 ENCOUNTER — Ambulatory Visit: Payer: 59 | Admitting: Physical Therapy

## 2013-06-16 ENCOUNTER — Ambulatory Visit: Payer: 59 | Admitting: Physical Therapy

## 2013-07-03 ENCOUNTER — Ambulatory Visit: Payer: 59 | Admitting: Physical Therapy

## 2013-07-06 ENCOUNTER — Other Ambulatory Visit (HOSPITAL_COMMUNITY): Payer: Self-pay | Admitting: Neurosurgery

## 2013-07-06 DIAGNOSIS — M542 Cervicalgia: Secondary | ICD-10-CM

## 2013-07-12 ENCOUNTER — Ambulatory Visit: Payer: 59 | Attending: Physical Medicine and Rehabilitation | Admitting: Rehabilitation

## 2013-07-12 DIAGNOSIS — M25569 Pain in unspecified knee: Secondary | ICD-10-CM | POA: Insufficient documentation

## 2013-07-12 DIAGNOSIS — M25559 Pain in unspecified hip: Secondary | ICD-10-CM | POA: Diagnosis not present

## 2013-07-12 DIAGNOSIS — IMO0001 Reserved for inherently not codable concepts without codable children: Secondary | ICD-10-CM | POA: Diagnosis present

## 2013-07-17 ENCOUNTER — Ambulatory Visit: Payer: 59 | Admitting: Physical Therapy

## 2013-07-17 DIAGNOSIS — IMO0001 Reserved for inherently not codable concepts without codable children: Secondary | ICD-10-CM | POA: Diagnosis not present

## 2013-07-18 ENCOUNTER — Ambulatory Visit (HOSPITAL_COMMUNITY)
Admission: RE | Admit: 2013-07-18 | Discharge: 2013-07-18 | Disposition: A | Discharge status: Home / Self Care | Payer: 59 | Source: Ambulatory Visit | Attending: Neurosurgery | Admitting: Neurosurgery

## 2013-07-18 DIAGNOSIS — M5030 Other cervical disc degeneration, unspecified cervical region: Secondary | ICD-10-CM | POA: Insufficient documentation

## 2013-07-18 DIAGNOSIS — M542 Cervicalgia: Secondary | ICD-10-CM | POA: Diagnosis present

## 2013-07-18 DIAGNOSIS — M4310 Spondylolisthesis, site unspecified: Secondary | ICD-10-CM | POA: Insufficient documentation

## 2013-07-18 DIAGNOSIS — M5020 Other cervical disc displacement, unspecified cervical region: Secondary | ICD-10-CM | POA: Diagnosis not present

## 2013-07-18 DIAGNOSIS — M25519 Pain in unspecified shoulder: Secondary | ICD-10-CM | POA: Diagnosis not present

## 2013-07-19 ENCOUNTER — Ambulatory Visit: Payer: 59 | Admitting: Physical Therapy

## 2013-07-19 DIAGNOSIS — IMO0001 Reserved for inherently not codable concepts without codable children: Secondary | ICD-10-CM | POA: Diagnosis not present

## 2013-07-24 ENCOUNTER — Encounter: Payer: 59 | Admitting: Physical Therapy

## 2013-07-25 ENCOUNTER — Other Ambulatory Visit: Payer: Self-pay | Admitting: Neurosurgery

## 2013-07-31 ENCOUNTER — Ambulatory Visit: Payer: 59 | Admitting: Rehabilitation

## 2013-07-31 DIAGNOSIS — IMO0001 Reserved for inherently not codable concepts without codable children: Secondary | ICD-10-CM | POA: Diagnosis not present

## 2013-08-03 ENCOUNTER — Ambulatory Visit: Payer: 59 | Admitting: Rehabilitation

## 2013-08-03 DIAGNOSIS — IMO0001 Reserved for inherently not codable concepts without codable children: Secondary | ICD-10-CM | POA: Diagnosis not present

## 2013-08-14 ENCOUNTER — Ambulatory Visit: Payer: 59 | Attending: Physical Medicine and Rehabilitation | Admitting: Physical Therapy

## 2013-08-14 DIAGNOSIS — M25569 Pain in unspecified knee: Secondary | ICD-10-CM | POA: Diagnosis not present

## 2013-08-14 DIAGNOSIS — M25559 Pain in unspecified hip: Secondary | ICD-10-CM | POA: Diagnosis not present

## 2013-08-14 DIAGNOSIS — IMO0001 Reserved for inherently not codable concepts without codable children: Secondary | ICD-10-CM | POA: Insufficient documentation

## 2013-08-15 ENCOUNTER — Encounter (HOSPITAL_COMMUNITY): Payer: Self-pay | Admitting: Pharmacy Technician

## 2013-08-15 ENCOUNTER — Other Ambulatory Visit (HOSPITAL_COMMUNITY): Payer: 59

## 2013-08-15 NOTE — Pre-Procedure Instructions (Signed)
[name]  08/15/2013   Your procedure is scheduled on:  Tuesday, August 18th  Report to [facility] Admitting at 0600 AM.  Call this number if you have problems the morning of surgery: [phone]   Remember:   Do not eat food or drink liquids after midnight.   Take these medicines the morning of surgery with A SIP OF WATER: valium if needed, hydrocodone if needed   Do not wear jewelry, make-up or nail polish.  Do not wear lotions, powders, or perfumes. You may wear deodorant.  Do not shave 48 hours prior to surgery. Men may shave face and neck.  Do not bring valuables to the hospital.  [facility] is not responsible  for any belongings or valuables.               Contacts, dentures or bridgework may not be worn into surgery.  Leave suitcase in the car. After surgery it may be brought to your room.  For patients admitted to the hospital, discharge time is determined by your   treatment team.               Patients discharged the day of surgery will not be allowed to drive home.  Please read over the following fact sheets that you were given: Pain Booklet, Coughing and Deep Breathing, MRSA Information and Surgical Site Infection Prevention [place] - Preparing for Surgery  Before surgery, you can play an important role.  Because skin is not sterile, your skin needs to be as free of germs as possible.  You can reduce the number of germs on you skin by washing with CHG (chlorahexidine gluconate) soap before surgery.  CHG is an antiseptic cleaner which kills germs and bonds with the skin to continue killing germs even after washing.  Please DO NOT use if you have an allergy to CHG or antibacterial soaps.  If your skin becomes reddened/irritated stop using the CHG and inform your nurse when you arrive at Short Stay.  Do not shave (including legs and underarms) for at least 48 hours prior to the first CHG shower.  You may shave your face.  Please follow these  instructions carefully:   1.  Shower with CHG Soap the night before surgery and the morning of Surgery.  2.  If you choose to wash your hair, wash your hair first as usual with your normal shampoo.  3.  After you shampoo, rinse your hair and body thoroughly to remove the shampoo.  4.  Use CHG as you would any other liquid soap.  You can apply CHG directly to the skin and wash gently with scrungie or a clean washcloth.  5.  Apply the CHG Soap to your body ONLY FROM THE NECK DOWN.  Do not use on open wounds or open sores.  Avoid contact with your eyes, ears, mouth and genitals (private parts).  Wash genitals (private parts) with your normal soap.  6.  Wash thoroughly, paying special attention to the area where your surgery will be performed.  7.  Thoroughly rinse your body with warm water from the neck down.  8.  DO NOT shower/wash with your normal soap after using and rinsing off the CHG Soap.  9.  Pat yourself dry with a clean towel.            10.  Wear clean pajamas.            11.  Place clean sheets on  your bed the night of your first shower and do not sleep with pets.  Day of Surgery  Do not apply any lotions/deoderants the morning of surgery.  Please wear clean clothes to the hospital/surgery center.

## 2013-08-16 ENCOUNTER — Encounter (HOSPITAL_COMMUNITY): Payer: Self-pay

## 2013-08-16 ENCOUNTER — Encounter (HOSPITAL_COMMUNITY)
Admission: RE | Admit: 2013-08-16 | Discharge: 2013-08-16 | Disposition: A | Discharge status: Home / Self Care | Payer: 59 | Source: Ambulatory Visit | Attending: Neurosurgery | Admitting: Neurosurgery

## 2013-08-16 DIAGNOSIS — Z01812 Encounter for preprocedural laboratory examination: Secondary | ICD-10-CM | POA: Insufficient documentation

## 2013-08-16 DIAGNOSIS — Z01818 Encounter for other preprocedural examination: Secondary | ICD-10-CM | POA: Insufficient documentation

## 2013-08-16 LAB — CBC
HCT: 36.3 % (ref 36.0–46.0)
Hemoglobin: 12.3 g/dL (ref 12.0–15.0)
MCH: 31.5 pg (ref 26.0–34.0)
MCHC: 33.9 g/dL (ref 30.0–36.0)
MCV: 92.8 fL (ref 78.0–100.0)
Platelets: 170 10*3/uL (ref 150–400)
RBC: 3.91 MIL/uL (ref 3.87–5.11)
RDW: 13.6 % (ref 11.5–15.5)
WBC: 4.5 10*3/uL (ref 4.0–10.5)

## 2013-08-16 LAB — COMPREHENSIVE METABOLIC PANEL
ALT: 23 U/L (ref 0–35)
AST: 23 U/L (ref 0–37)
Albumin: 3.9 g/dL (ref 3.5–5.2)
Alkaline Phosphatase: 68 U/L (ref 39–117)
Anion gap: 11 (ref 5–15)
BUN: 10 mg/dL (ref 6–23)
CO2: 28 mEq/L (ref 19–32)
Calcium: 9.5 mg/dL (ref 8.4–10.5)
Chloride: 101 mEq/L (ref 96–112)
Creatinine, Ser: 0.54 mg/dL (ref 0.50–1.10)
GFR calc Af Amer: >90 mL/min (ref >90)
GFR calc non Af Amer: >90 mL/min (ref >90)
Glucose, Bld: 90 mg/dL (ref 70–99)
Potassium: 3.9 mEq/L (ref 3.7–5.3)
Sodium: 140 mEq/L (ref 137–147)
Total Bilirubin: 0.4 mg/dL (ref 0.3–1.2)
Total Protein: 7.5 g/dL (ref 6.0–8.3)

## 2013-08-16 LAB — SURGICAL PCR SCREEN
MRSA, PCR: NEGATIVE
Staphylococcus aureus: NEGATIVE

## 2013-08-16 NOTE — Progress Notes (Signed)
Pt is a Jehovah witness.  I forgot to get her to sign a "blood refusal" ( she will accept albumin however.).Marland KitchenPlease have her sign DOS.Marland KitchenMarland KitchenDA

## 2013-08-17 ENCOUNTER — Encounter: Payer: 59 | Admitting: Physical Therapy

## 2013-08-21 ENCOUNTER — Encounter: Payer: 59 | Admitting: Rehabilitation

## 2013-08-21 MED ORDER — CEFAZOLIN SODIUM-DEXTROSE 2-3 GM-% IV SOLR
2.0000 g | INTRAVENOUS | Status: AC
  Administered 2013-08-22: 2 g via INTRAVENOUS

## 2013-08-21 NOTE — H&P (Signed)
[name] is an 53 y.o. female.   Chief Complaint: neck pain HPI: patient complaining of neck pain with radiation to the right upper extremity, no better with conservative treatment. Mri shows a hnp at cervical 5-6 with spondylolisthesis at 4-5 which worsen with flexion  Past Medical History  Diagnosis Date  . Endometriosis   . Endometrial polyp   . IBS (irritable bowel syndrome)   . Acid reflux   . Colitis   . Behcet's syndrome   . GERD (gastroesophageal reflux disease)   . Uveitis   . Osteopenia   . Adrenal insufficiency   . Subjective visual disturbance 07/14/2012  . Tear of lateral meniscus of right knee, current     Tear of lateral & medial meniscus of right knee  . Asthma   . PONV (postoperative nausea and vomiting)     also low blood pressure at times  . Pneumonia     20 years ago    Past Surgical History  Procedure Laterality Date  . Cholecystectomy  12/2004  . Pelvic laparoscopy      DL  . Dilation and curettage of uterus    . Hysteroscopy    . Cesarean section  03/2001  . Knee surgery Left 05/1998  . Hand surgery    . Breast surgery      Cysts excised  . Oophorectomy      BSO  . Vaginal hysterectomy  07/2007    LAVH BSO  . Back surgery    . Knee arthroscopy with lateral menisectomy Right 12/12/2012    Procedure: KNEE ARTHROSCOPY WITH LATERAL MENISECTOMY;  Surgeon: Nilda Simmer, MD;  Location: [facility];  Service: Orthopedics;  Laterality: Right;  . Thyroid cyst excision      Family History  Problem Relation Age of Onset  . Diabetes Mother   . Breast cancer Mother     Age 30's  . Stroke Mother   . Gastric cancer Father   . Diabetes Sister   . Hypertension Sister   . Breast cancer Sister     Age 29   Social History:  reports that she has never smoked. She has never used smokeless tobacco. She reports that she does not drink alcohol or use illicit drugs.  Allergies:  Allergies  Allergen Reactions  . Celebrex [Celecoxib]  Other (See Comments)    Bleeding/bruising including bleeding for multiple days  . Latex Shortness Of Breath and Dermatitis  . Sulfa Drugs Cross Reactors Other (See Comments)    Eyes turn red, renal failure, stomach problems (vomiting, diarrhea), vasculature collapses, migraine like symptoms  . Sulfites Other (See Comments)    Eyes turn red, renal failure, stomach problems (vomiting, diarrhea), vasculature collapses, migraine like symptoms  . Simponi [Golimumab] Swelling    Headache, blurred vision.  Marland Kitchen Epinephrine Other (See Comments)    Tachycardia  . Erythromycin Itching and Rash    Has tolerated azithromycin  . Morphine And Related Itching and Rash    Includes codeine; can take hydrocodone    No prescriptions prior to admission    No results found for this or any previous visit (from the past 48 hour(s)). No results found.  Review of Systems  Constitutional: Negative.   Eyes: Negative.   Respiratory: Negative.   Cardiovascular: Negative.   Gastrointestinal: Negative.   Genitourinary: Negative.   Musculoskeletal: Positive for neck pain.  Skin: Negative.   Neurological: Positive for sensory change and focal weakness.  Endo/Heme/Allergies: Negative.   Psychiatric/Behavioral:  Negative.     There were no vitals taken for this visit. Physical Exam hent, nl. Neck, pain with mobility. Cv,nl. Lungs clear. Abdomen, soft. Extremities surgical scar in knee. Previous surgical fusion. NEURO no weakness, dtr, wnl. Sensory nl. Mri and cervical rays shows hnp at c5-6 qirh spondylolisthesis at 4-5   Assessment/Plan Decompression and fusion at cervical 4-5, 5-6. She is aware of risks and benefits  Terrah Decoster M 08/21/2013, 5:50 PM

## 2013-08-22 ENCOUNTER — Encounter (HOSPITAL_COMMUNITY): Payer: Self-pay | Admitting: *Deleted

## 2013-08-22 ENCOUNTER — Encounter (HOSPITAL_COMMUNITY): Payer: 59 | Admitting: Anesthesiology

## 2013-08-22 ENCOUNTER — Encounter (HOSPITAL_COMMUNITY)
Admission: RE | Disposition: A | Discharge status: Home / Self Care | Payer: Self-pay | Source: Ambulatory Visit | Attending: Neurosurgery

## 2013-08-22 ENCOUNTER — Ambulatory Visit (HOSPITAL_COMMUNITY): Payer: 59 | Admitting: Anesthesiology

## 2013-08-22 ENCOUNTER — Inpatient Hospital Stay (HOSPITAL_COMMUNITY)
Admission: RE | Admit: 2013-08-22 | Discharge: 2013-08-24 | DRG: 472 | Disposition: A | Discharge status: Home / Self Care | Payer: 59 | Source: Ambulatory Visit | Attending: Neurosurgery | Admitting: Neurosurgery

## 2013-08-22 ENCOUNTER — Ambulatory Visit (HOSPITAL_COMMUNITY): Payer: 59

## 2013-08-22 DIAGNOSIS — M5020 Other cervical disc displacement, unspecified cervical region: Principal | ICD-10-CM | POA: Diagnosis present

## 2013-08-22 DIAGNOSIS — M352 Behcet's disease: Secondary | ICD-10-CM | POA: Diagnosis present

## 2013-08-22 DIAGNOSIS — Q762 Congenital spondylolisthesis: Secondary | ICD-10-CM

## 2013-08-22 DIAGNOSIS — K219 Gastro-esophageal reflux disease without esophagitis: Secondary | ICD-10-CM | POA: Diagnosis present

## 2013-08-22 DIAGNOSIS — E2749 Other adrenocortical insufficiency: Secondary | ICD-10-CM | POA: Diagnosis present

## 2013-08-22 DIAGNOSIS — J45909 Unspecified asthma, uncomplicated: Secondary | ICD-10-CM | POA: Diagnosis present

## 2013-08-22 DIAGNOSIS — Z91040 Latex allergy status: Secondary | ICD-10-CM

## 2013-08-22 DIAGNOSIS — Z886 Allergy status to analgesic agent status: Secondary | ICD-10-CM | POA: Diagnosis not present

## 2013-08-22 DIAGNOSIS — IMO0002 Reserved for concepts with insufficient information to code with codable children: Secondary | ICD-10-CM | POA: Diagnosis present

## 2013-08-22 DIAGNOSIS — Z96659 Presence of unspecified artificial knee joint: Secondary | ICD-10-CM

## 2013-08-22 DIAGNOSIS — M4802 Spinal stenosis, cervical region: Secondary | ICD-10-CM | POA: Diagnosis present

## 2013-08-22 HISTORY — DX: Procedure and treatment not carried out because of patient's decision for reasons of belief and group pressure: Z53.1

## 2013-08-22 HISTORY — PX: ANTERIOR FUSION CERVICAL SPINE: SUR626

## 2013-08-22 HISTORY — PX: ANTERIOR CERVICAL DECOMP/DISCECTOMY FUSION: SHX1161

## 2013-08-22 HISTORY — DX: Reserved for inherently not codable concepts without codable children: IMO0001

## 2013-08-22 LAB — NO BLOOD PRODUCTS

## 2013-08-22 SURGERY — ANTERIOR CERVICAL DECOMPRESSION/DISCECTOMY FUSION 2 LEVELS
Anesthesia: General | Site: Neck

## 2013-08-22 MED ORDER — OXYCODONE-ACETAMINOPHEN 5-325 MG PO TABS: 1.0000 | ORAL_TABLET | ORAL | Status: DC | PRN

## 2013-08-22 MED ORDER — FENTANYL CITRATE 0.05 MG/ML IJ SOLN
INTRAMUSCULAR | Status: AC
  Filled 2013-08-22: qty 5

## 2013-08-22 MED ORDER — DEXAMETHASONE 4 MG PO TABS
4.0000 mg | ORAL_TABLET | Freq: Four times a day (QID) | ORAL | Status: DC
  Administered 2013-08-23 – 2013-08-24 (×6): 4 mg via ORAL
  Filled 2013-08-22 (×6): qty 1

## 2013-08-22 MED ORDER — LIDOCAINE HCL (CARDIAC) 20 MG/ML IV SOLN
INTRAVENOUS | Status: AC
  Filled 2013-08-22: qty 5

## 2013-08-22 MED ORDER — GLYCOPYRROLATE 0.2 MG/ML IJ SOLN
INTRAMUSCULAR | Status: DC | PRN
  Administered 2013-08-22: 0.4 mg via INTRAVENOUS

## 2013-08-22 MED ORDER — ACETAMINOPHEN 325 MG PO TABS: 650.0000 mg | ORAL_TABLET | ORAL | Status: DC | PRN

## 2013-08-22 MED ORDER — HYDROMORPHONE HCL PF 1 MG/ML IJ SOLN
INTRAMUSCULAR | Status: AC
  Filled 2013-08-22: qty 1

## 2013-08-22 MED ORDER — OXYCODONE HCL 5 MG PO TABS
ORAL_TABLET | ORAL | Status: AC
  Filled 2013-08-22: qty 1

## 2013-08-22 MED ORDER — PHENOL 1.4 % MT LIQD
1.0000 | OROMUCOSAL | Status: DC | PRN
  Filled 2013-08-22: qty 177

## 2013-08-22 MED ORDER — PROPOFOL 10 MG/ML IV BOLUS
INTRAVENOUS | Status: AC
  Filled 2013-08-22: qty 20

## 2013-08-22 MED ORDER — CEFAZOLIN SODIUM 1-5 GM-% IV SOLN
1.0000 g | Freq: Three times a day (TID) | INTRAVENOUS | Status: AC
  Administered 2013-08-22 (×2): 1 g via INTRAVENOUS
  Filled 2013-08-22 (×2): qty 50

## 2013-08-22 MED ORDER — ONDANSETRON HCL 4 MG/2ML IJ SOLN
INTRAMUSCULAR | Status: DC | PRN
  Administered 2013-08-22: 4 mg via INTRAVENOUS

## 2013-08-22 MED ORDER — ROCURONIUM BROMIDE 50 MG/5ML IV SOLN
INTRAVENOUS | Status: AC
  Filled 2013-08-22: qty 1

## 2013-08-22 MED ORDER — DEXAMETHASONE SODIUM PHOSPHATE 10 MG/ML IJ SOLN
INTRAMUSCULAR | Status: AC
  Filled 2013-08-22: qty 1

## 2013-08-22 MED ORDER — SODIUM CHLORIDE 0.9 % IV SOLN: 250.0000 mL | INTRAVENOUS | Status: DC

## 2013-08-22 MED ORDER — NALOXONE HCL 0.4 MG/ML IJ SOLN
INTRAMUSCULAR | Status: AC
  Filled 2013-08-22: qty 1

## 2013-08-22 MED ORDER — SUCCINYLCHOLINE CHLORIDE 20 MG/ML IJ SOLN
INTRAMUSCULAR | Status: AC
  Filled 2013-08-22: qty 1

## 2013-08-22 MED ORDER — ONDANSETRON HCL 4 MG/2ML IJ SOLN
INTRAMUSCULAR | Status: AC
  Filled 2013-08-22: qty 2

## 2013-08-22 MED ORDER — SCOPOLAMINE 1 MG/3DAYS TD PT72
MEDICATED_PATCH | TRANSDERMAL | Status: AC
  Filled 2013-08-22: qty 1

## 2013-08-22 MED ORDER — PREDNISONE 5 MG PO TABS
5.0000 mg | ORAL_TABLET | Freq: Every day | ORAL | Status: DC
  Administered 2013-08-23 – 2013-08-24 (×2): 5 mg via ORAL
  Filled 2013-08-22 (×2): qty 1

## 2013-08-22 MED ORDER — HYDROMORPHONE HCL PF 1 MG/ML IJ SOLN
0.5000 mg | INTRAMUSCULAR | Status: DC | PRN
  Administered 2013-08-22 (×2): 0.5 mg via INTRAVENOUS
  Filled 2013-08-22 (×2): qty 1

## 2013-08-22 MED ORDER — HYDROMORPHONE HCL PF 1 MG/ML IJ SOLN
0.2500 mg | INTRAMUSCULAR | Status: DC | PRN
  Administered 2013-08-22 (×3): 0.5 mg via INTRAVENOUS

## 2013-08-22 MED ORDER — SODIUM CHLORIDE 0.9 % IV SOLN
INTRAVENOUS | Status: DC
  Administered 2013-08-22: 14:00:00 via INTRAVENOUS
  Administered 2013-08-23: 1000 mL via INTRAVENOUS

## 2013-08-22 MED ORDER — PROPOFOL 10 MG/ML IV BOLUS
INTRAVENOUS | Status: DC | PRN
  Administered 2013-08-22: 150 mg via INTRAVENOUS

## 2013-08-22 MED ORDER — LIDOCAINE HCL (CARDIAC) 20 MG/ML IV SOLN
INTRAVENOUS | Status: DC | PRN
  Administered 2013-08-22: 80 mg via INTRAVENOUS

## 2013-08-22 MED ORDER — LACTATED RINGERS IV SOLN
INTRAVENOUS | Status: DC
  Administered 2013-08-22: 07:00:00 via INTRAVENOUS

## 2013-08-22 MED ORDER — LACTATED RINGERS IV SOLN
INTRAVENOUS | Status: DC | PRN
  Administered 2013-08-22 (×2): via INTRAVENOUS

## 2013-08-22 MED ORDER — NEOSTIGMINE METHYLSULFATE 10 MG/10ML IV SOLN
INTRAVENOUS | Status: AC
  Filled 2013-08-22: qty 1

## 2013-08-22 MED ORDER — ARTIFICIAL TEARS OP OINT
TOPICAL_OINTMENT | OPHTHALMIC | Status: DC | PRN
  Administered 2013-08-22: 1 via OPHTHALMIC

## 2013-08-22 MED ORDER — ROCURONIUM BROMIDE 100 MG/10ML IV SOLN
INTRAVENOUS | Status: DC | PRN
  Administered 2013-08-22: 40 mg via INTRAVENOUS

## 2013-08-22 MED ORDER — FENTANYL CITRATE 0.05 MG/ML IJ SOLN
INTRAMUSCULAR | Status: DC | PRN
  Administered 2013-08-22: 50 ug via INTRAVENOUS
  Administered 2013-08-22 (×2): 25 ug via INTRAVENOUS
  Administered 2013-08-22 (×4): 50 ug via INTRAVENOUS
  Administered 2013-08-22: 100 ug via INTRAVENOUS
  Administered 2013-08-22: 50 ug via INTRAVENOUS

## 2013-08-22 MED ORDER — THROMBIN 5000 UNITS EX SOLR
OROMUCOSAL | Status: DC | PRN
  Administered 2013-08-22 (×2): via TOPICAL

## 2013-08-22 MED ORDER — HYDROCODONE-ACETAMINOPHEN 10-325 MG PO TABS
1.0000 | ORAL_TABLET | ORAL | Status: DC | PRN
  Administered 2013-08-23 – 2013-08-24 (×7): 1 via ORAL
  Filled 2013-08-22 (×8): qty 1

## 2013-08-22 MED ORDER — MENTHOL 3 MG MT LOZG: 1.0000 | LOZENGE | OROMUCOSAL | Status: DC | PRN

## 2013-08-22 MED ORDER — ARTIFICIAL TEARS OP OINT
TOPICAL_OINTMENT | OPHTHALMIC | Status: AC
  Filled 2013-08-22: qty 3.5

## 2013-08-22 MED ORDER — THROMBIN 5000 UNITS EX SOLR
CUTANEOUS | Status: DC | PRN
  Administered 2013-08-22: 5000 [IU] via TOPICAL

## 2013-08-22 MED ORDER — OXYCODONE HCL 5 MG PO TABS
5.0000 mg | ORAL_TABLET | Freq: Once | ORAL | Status: AC | PRN
  Administered 2013-08-22: 5 mg via ORAL

## 2013-08-22 MED ORDER — HEMOSTATIC AGENTS (NO CHARGE) OPTIME
TOPICAL | Status: DC | PRN
  Administered 2013-08-22: 1 via TOPICAL

## 2013-08-22 MED ORDER — SODIUM CHLORIDE 0.9 % IJ SOLN: 3.0000 mL | INTRAMUSCULAR | Status: DC | PRN

## 2013-08-22 MED ORDER — MIDAZOLAM HCL 5 MG/5ML IJ SOLN
INTRAMUSCULAR | Status: DC | PRN
  Administered 2013-08-22: 2 mg via INTRAVENOUS

## 2013-08-22 MED ORDER — COLCHICINE 0.6 MG PO TABS
0.6000 mg | ORAL_TABLET | Freq: Two times a day (BID) | ORAL | Status: DC
  Administered 2013-08-22 – 2013-08-24 (×4): 0.6 mg via ORAL
  Filled 2013-08-22 (×4): qty 1

## 2013-08-22 MED ORDER — SODIUM CHLORIDE 0.9 % IJ SOLN
3.0000 mL | Freq: Two times a day (BID) | INTRAMUSCULAR | Status: DC
  Administered 2013-08-23: 3 mL via INTRAVENOUS

## 2013-08-22 MED ORDER — LORATADINE 10 MG PO TABS
10.0000 mg | ORAL_TABLET | Freq: Every day | ORAL | Status: DC
  Filled 2013-08-22 (×2): qty 1

## 2013-08-22 MED ORDER — DEXAMETHASONE SODIUM PHOSPHATE 10 MG/ML IJ SOLN
INTRAMUSCULAR | Status: DC | PRN
  Administered 2013-08-22: 10 mg via INTRAVENOUS

## 2013-08-22 MED ORDER — 0.9 % SODIUM CHLORIDE (POUR BTL) OPTIME
TOPICAL | Status: DC | PRN
  Administered 2013-08-22: 1000 mL

## 2013-08-22 MED ORDER — NEOSTIGMINE METHYLSULFATE 10 MG/10ML IV SOLN
INTRAVENOUS | Status: DC | PRN
  Administered 2013-08-22: 3 mg via INTRAVENOUS

## 2013-08-22 MED ORDER — ACETAMINOPHEN 650 MG RE SUPP: 650.0000 mg | RECTAL | Status: DC | PRN

## 2013-08-22 MED ORDER — ONDANSETRON HCL 4 MG/2ML IJ SOLN
4.0000 mg | INTRAMUSCULAR | Status: DC | PRN
  Administered 2013-08-22: 4 mg via INTRAVENOUS
  Filled 2013-08-22: qty 2

## 2013-08-22 MED ORDER — DIAZEPAM 5 MG PO TABS
ORAL_TABLET | ORAL | Status: AC
  Filled 2013-08-22: qty 1

## 2013-08-22 MED ORDER — MIDAZOLAM HCL 2 MG/2ML IJ SOLN
INTRAMUSCULAR | Status: AC
  Filled 2013-08-22: qty 2

## 2013-08-22 MED ORDER — GLYCOPYRROLATE 0.2 MG/ML IJ SOLN
INTRAMUSCULAR | Status: AC
  Filled 2013-08-22: qty 2

## 2013-08-22 MED ORDER — OXYCODONE HCL 5 MG/5ML PO SOLN: 5.0000 mg | Freq: Once | ORAL | Status: AC | PRN

## 2013-08-22 MED ORDER — SCOPOLAMINE 1 MG/3DAYS TD PT72
1.0000 | MEDICATED_PATCH | TRANSDERMAL | Status: DC
  Administered 2013-08-22: 1.5 mg via TRANSDERMAL
  Filled 2013-08-22: qty 1

## 2013-08-22 MED ORDER — DEXAMETHASONE SODIUM PHOSPHATE 4 MG/ML IJ SOLN
4.0000 mg | Freq: Four times a day (QID) | INTRAMUSCULAR | Status: DC
  Administered 2013-08-22 – 2013-08-23 (×2): 4 mg via INTRAVENOUS
  Filled 2013-08-22 (×3): qty 1

## 2013-08-22 MED ORDER — DIAZEPAM 5 MG PO TABS
5.0000 mg | ORAL_TABLET | Freq: Four times a day (QID) | ORAL | Status: DC | PRN
  Administered 2013-08-22 – 2013-08-23 (×2): 5 mg via ORAL
  Filled 2013-08-22: qty 1

## 2013-08-22 MED ORDER — PROMETHAZINE HCL 25 MG/ML IJ SOLN: 6.2500 mg | INTRAMUSCULAR | Status: DC | PRN

## 2013-08-22 SURGICAL SUPPLY — 61 items
APL SKNCLS STERI-STRIP NONHPOA (GAUZE/BANDAGES/DRESSINGS) ×1
BENZOIN TINCTURE PRP APPL 2/3 (GAUZE/BANDAGES/DRESSINGS) ×2 IMPLANT
BIT DRILL SPINE QC 12 (BIT) ×1 IMPLANT
BLADE ULTRA TIP 2M (BLADE) ×2 IMPLANT
BNDG GAUZE ELAST 4 BULKY (GAUZE/BANDAGES/DRESSINGS) ×4 IMPLANT
BUR BARREL STRAIGHT FLUTE 4.0 (BURR) IMPLANT
BUR MATCHSTICK NEURO 3.0 LAGG (BURR) ×2 IMPLANT
CANISTER SUCT 3000ML (MISCELLANEOUS) ×2 IMPLANT
CONT SPEC 4OZ CLIKSEAL STRL BL (MISCELLANEOUS) ×2 IMPLANT
CORDS BIPOLAR (ELECTRODE) ×1 IMPLANT
COVER MAYO STAND STRL (DRAPES) ×2 IMPLANT
DRAPE C-ARM 42X72 X-RAY (DRAPES) ×4 IMPLANT
DRAPE LAPAROTOMY 100X72 PEDS (DRAPES) ×2 IMPLANT
DRAPE MICROSCOPE LEICA (MISCELLANEOUS) ×2 IMPLANT
DRAPE POUCH INSTRU U-SHP 10X18 (DRAPES) ×2 IMPLANT
DURAPREP 6ML APPLICATOR 50/CS (WOUND CARE) ×2 IMPLANT
ELECT CAUTERY BLADE 6.4 (BLADE) ×1 IMPLANT
ELECT REM PT RETURN 9FT ADLT (ELECTROSURGICAL) ×2
ELECTRODE REM PT RTRN 9FT ADLT (ELECTROSURGICAL) ×1 IMPLANT
GAUZE PACKING FOLDED 1IN STRL (GAUZE/BANDAGES/DRESSINGS) ×1 IMPLANT
GAUZE SPONGE 4X4 12PLY STRL (GAUZE/BANDAGES/DRESSINGS) ×2 IMPLANT
GAUZE SPONGE 4X4 16PLY XRAY LF (GAUZE/BANDAGES/DRESSINGS) IMPLANT
GLOVE BIOGEL M 8.0 STRL (GLOVE) ×1 IMPLANT
GLOVE EXAM NITRILE LRG STRL (GLOVE) IMPLANT
GLOVE EXAM NITRILE MD LF STRL (GLOVE) IMPLANT
GLOVE EXAM NITRILE XL STR (GLOVE) IMPLANT
GLOVE EXAM NITRILE XS STR PU (GLOVE) IMPLANT
GLOVE SS N UNI LF 7.5 STRL (GLOVE) ×3 IMPLANT
GLOVE SS N UNI LF 8.0 STRL (GLOVE) ×1 IMPLANT
GOWN STRL REUS W/ TWL LRG LVL3 (GOWN DISPOSABLE) ×1 IMPLANT
GOWN STRL REUS W/ TWL XL LVL3 (GOWN DISPOSABLE) IMPLANT
GOWN STRL REUS W/TWL 2XL LVL3 (GOWN DISPOSABLE) ×1 IMPLANT
GOWN STRL REUS W/TWL LRG LVL3 (GOWN DISPOSABLE) ×4
GOWN STRL REUS W/TWL XL LVL3 (GOWN DISPOSABLE)
HALTER HD/CHIN CERV TRACTION D (MISCELLANEOUS) ×1 IMPLANT
HEMOSTAT POWDER KIT SURGIFOAM (HEMOSTASIS) IMPLANT
KIT BASIN OR (CUSTOM PROCEDURE TRAY) ×2 IMPLANT
KIT ROOM TURNOVER OR (KITS) ×2 IMPLANT
NDL SPNL 22GX3.5 QUINCKE BK (NEEDLE) ×1 IMPLANT
NEEDLE SPNL 22GX3.5 QUINCKE BK (NEEDLE) ×2 IMPLANT
NS IRRIG 1000ML POUR BTL (IV SOLUTION) ×2 IMPLANT
PACK LAMINECTOMY NEURO (CUSTOM PROCEDURE TRAY) ×2 IMPLANT
PATTIES SURGICAL .5 X1 (DISPOSABLE) ×2 IMPLANT
PENCIL BUTTON BLDE SNGL 10FT (ELECTRODE) ×1 IMPLANT
PLATE 34MM (Plate) ×1 IMPLANT
PUTTY DBX 1CC (Putty) ×2 IMPLANT
PUTTY DBX 1CC DEPUY (Putty) IMPLANT
RUBBERBAND STERILE (MISCELLANEOUS) ×4 IMPLANT
SCREW SELF TAP VAR 4.2X12PRO (Screw) ×6 IMPLANT
SPACER ACDF SM LORDOTIC 7 (Spacer) ×1 IMPLANT
SPONGE INTESTINAL PEANUT (DISPOSABLE) ×2 IMPLANT
SPONGE SURGIFOAM ABS GEL SZ50 (HEMOSTASIS) ×2 IMPLANT
STRIP CLOSURE SKIN 1/2X4 (GAUZE/BANDAGES/DRESSINGS) ×2 IMPLANT
SUT VIC AB 3-0 SH 8-18 (SUTURE) ×2 IMPLANT
SUT VICRYL 4-0 PS2 18IN ABS (SUTURE) ×1 IMPLANT
SYR 20ML ECCENTRIC (SYRINGE) ×2 IMPLANT
TAPE CLOTH SURG 4X10 WHT LF (GAUZE/BANDAGES/DRESSINGS) ×1 IMPLANT
TAPE STRIPS DRAPE STRL (GAUZE/BANDAGES/DRESSINGS) ×1 IMPLANT
TOWEL OR 17X24 6PK STRL BLUE (TOWEL DISPOSABLE) ×2 IMPLANT
TOWEL OR 17X26 10 PK STRL BLUE (TOWEL DISPOSABLE) ×2 IMPLANT
WATER STERILE IRR 1000ML POUR (IV SOLUTION) ×2 IMPLANT

## 2013-08-22 NOTE — Progress Notes (Signed)
Pt is a Jehovah Witness. Refusal of blood products faxed to blood bank by Judyann Munson, RN after pt signed form.

## 2013-08-22 NOTE — Progress Notes (Signed)
Feels sick with the fentanyl

## 2013-08-22 NOTE — Transfer of Care (Signed)
Immediate Anesthesia Transfer of Care Note  Patient: [name]  Procedure(s) Performed: Procedure(s) with comments: ANTERIOR CERVICAL DECOMPRESSION/DISCECTOMY FUSION 2 LEVELS (N/A) - C4-5 C5-6 Anterior cervical decompression/diskectomy/fusion  Patient Location: PACU  Anesthesia Type:General  Level of Consciousness: awake, alert , oriented and patient cooperative  Airway & Oxygen Therapy: Patient Spontanous Breathing and Patient connected to nasal cannula oxygen  Post-op Assessment: Report given to PACU RN, Post -op Vital signs reviewed and stable and Patient moving all extremities X 4  Post vital signs: Reviewed and stable  Complications: No apparent anesthesia complications

## 2013-08-22 NOTE — Anesthesia Preprocedure Evaluation (Addendum)
Anesthesia Evaluation  Patient identified by MRN, date of birth, ID band Patient awake    Reviewed: Allergy & Precautions, H&P , NPO status   Airway Mallampati: I      Dental  (+) Teeth Intact   Pulmonary asthma , neg pneumonia -,  breath sounds clear to auscultation        Cardiovascular negative cardio ROS  Rhythm:Regular Rate:Normal     Neuro/Psych negative neurological ROS     GI/Hepatic GERD-  ,  Endo/Other  Adrenal insuffiency reqiring Prednisone daily, Behcets syn  Renal/GU negative Renal ROS     Musculoskeletal   Abdominal   Peds  Hematology   Anesthesia Other Findings   Reproductive/Obstetrics                          Anesthesia Physical Anesthesia Plan  ASA: II  Anesthesia Plan: General   Post-op Pain Management:    Induction: Intravenous  Airway Management Planned: Oral ETT  Additional Equipment:   Intra-op Plan:   Post-operative Plan: Extubation in OR  Informed Consent: I have reviewed the patients History and Physical, chart, labs and discussed the procedure including the risks, benefits and alternatives for the proposed anesthesia with the patient or authorized representative who has indicated his/her understanding and acceptance.   Dental advisory given  Plan Discussed with: CRNA and Surgeon  Anesthesia Plan Comments:         Anesthesia Quick Evaluation

## 2013-08-22 NOTE — Anesthesia Postprocedure Evaluation (Signed)
  Anesthesia Post-op Note  Patient: [name]  Procedure(s) Performed: Procedure(s) with comments: ANTERIOR CERVICAL DECOMPRESSION/DISCECTOMY FUSION 2 LEVELS (N/A) - C4-5 C5-6 Anterior cervical decompression/diskectomy/fusion  Patient Location: PACU  Anesthesia Type:General  Level of Consciousness: awake and sedated  Airway and Oxygen Therapy: Patient Spontanous Breathing  Post-op Pain: mild  Post-op Assessment: Post-op Vital signs reviewed  Post-op Vital Signs: stable  Last Vitals:  Filed Vitals:   08/22/13 1239  BP: 133/79  Pulse: 73  Temp:   Resp: 13    Complications: No apparent anesthesia complications

## 2013-08-22 NOTE — Progress Notes (Signed)
Orthopedic Tech Progress Note Patient Details:  [name] October 02, 1960 [id number]  Ortho Devices Type of Ortho Device: Soft collar Ortho Device/Splint Interventions: Application   Cammer, Mickie Bail 08/22/2013, 12:11 PM

## 2013-08-22 NOTE — Evaluation (Signed)
Physical Therapy Evaluation Patient Details Name: [name] MRN: [id number] DOB: 11-12-1960 Today's Date: 08/22/2013   History of Present Illness  pt s/p c4-6 decompression discectomy and fusion  Clinical Impression  Pt admitted with/for cervical fusion surgery.  Pt currently limited functionally due to the problems listed below.  (see problems list.)  Pt will benefit from PT to maximize function and safety to be able to get home safely with available assist of family.     Follow Up Recommendations No PT follow up;Supervision for mobility/[name]    Equipment Recommendations  None recommended by PT    Recommendations for Other Services       Precautions / Restrictions Precautions Precautions: Cervical Required Braces or Orthoses: Cervical Brace Cervical Brace: Soft collar      Mobility  Bed Mobility Overal bed mobility: Needs Assistance Bed Mobility: Rolling;Sidelying to Sit;Sit to Sidelying Rolling: Min assist Sidelying to sit: Min assist     Sit to sidelying: Min assist General bed mobility comments: cued for safe technique and minimal assist  Transfers Overall transfer level: Needs assistance   Transfers: Sit to/from Stand Sit to Stand: Min assist         General transfer comment: cued for safe technique; steady assist  Ambulation/Gait Ambulation/Gait assistance: Min guard Ambulation Distance (Feet): 15 Feet (to/from bathroom) Assistive device:  (pushing iv pole) Gait Pattern/deviations: Step-through pattern     General Gait Details: steady, but guarded, minimal contact due to patient nauseated.  Stairs            Wheelchair Mobility    Modified Rankin (Stroke Patients Only)       Balance Overall balance assessment: No apparent balance deficits (not formally assessed)                                           Pertinent Vitals/Pain Pain Assessment: 0-10 Pain Score: 5  Pain Intervention(s): Patient requesting  pain meds-RN notified    Home Living Family/patient expects to be discharged to:: Private residence Living Arrangements: Children Available Help at Discharge: Family Type of Home: House Home Access: Stairs to enter Entrance Stairs-Rails: Doctor, general practice of Steps: 2 Home Layout: One level   Additional Comments: Mostly will be assist by her young son,  pt is going through separation    Prior Function Level of Independence: Independent               Hand Dominance        Extremity/Trunk Assessment               Lower Extremity Assessment: Overall WFL for tasks assessed         Communication   Communication: No difficulties  Cognition Arousal/Alertness: Awake/alert Behavior During Therapy: WFL for tasks assessed/performed Overall Cognitive Status: Within Functional Limits for tasks assessed                      General Comments General comments (skin integrity, edema, etc.): Educated/reinforced cervical precautions, log roll, lifting restrictions, bracing issues, progression of activity    Exercises        Assessment/Plan    PT Assessment Patient needs continued PT services  PT Diagnosis Acute pain   PT Problem List Decreased activity tolerance;Decreased mobility;Decreased knowledge of use of DME;Decreased knowledge of precautions;Pain  PT Treatment Interventions DME instruction;Gait training;Stair training;Functional mobility training;Therapeutic activities;Patient/family education  PT Goals (Current goals can be found in the Care Plan section) Acute Rehab PT Goals Patient Stated Goal: back independent and able to parent child PT Goal Formulation: With patient Time For Goal Achievement: 08/29/13 Potential to Achieve Goals: Good    Frequency Min 5X/week   Barriers to discharge        Co-evaluation               End of Session Equipment Utilized During Treatment: Cervical collar Activity Tolerance: Patient  tolerated treatment well (limited session due to nausea) Patient left: in bed;with call bell/phone within reach;with family/visitor present Nurse Communication: Mobility status    Functional Assessment Tool Used: clinical judgement Functional Limitation: Mobility: Walking and moving around Mobility: Walking and Moving Around Current Status ([id number]): At least 1 percent but less than 20 percent impaired, limited or restricted Mobility: Walking and Moving Around Goal Status [phone]): At least 1 percent but less than 20 percent impaired, limited or restricted    Time: 1720-1756 PT Time Calculation (min): 36 min   Charges:   PT Evaluation $Initial PT Evaluation Tier I: 1 Procedure PT Treatments $Gait Training: 8-22 mins $Self Care/Home Management: 8-22   PT G Codes:   Functional Assessment Tool Used: clinical judgement Functional Limitation: Mobility: Walking and moving around    Darleene Cumpian, [name] 08/22/2013, 6:08 PM 08/22/2013  [place] Bing, PT [phone]  (pager)

## 2013-08-23 ENCOUNTER — Encounter (HOSPITAL_COMMUNITY): Payer: Self-pay | Admitting: Neurosurgery

## 2013-08-23 MED ORDER — TAMSULOSIN HCL 0.4 MG PO CAPS
0.4000 mg | ORAL_CAPSULE | Freq: Every day | ORAL | Status: DC
  Administered 2013-08-23 – 2013-08-24 (×2): 0.4 mg via ORAL
  Filled 2013-08-23 (×2): qty 1

## 2013-08-23 NOTE — Progress Notes (Signed)
Patient ID: [name], female   DOB: Dec 16, 1960, 53 y.o.   MRN: [id number] Better, taken po analgesics. No weakness. Vs wnl. See orders

## 2013-08-23 NOTE — Op Note (Signed)
NAMEALMAS, RAKE           ACCOUNT NO.:  [id number]  MEDICAL RECORD NO.:  [id number]  LOCATION:  [id number]                        FACILITY:  [facility]  PHYSICIAN:  Hilda Lias, M.D.   DATE OF BIRTH:  1960/04/25  DATE OF PROCEDURE:  08/22/2013 DATE OF DISCHARGE:                              OPERATIVE REPORT   PREOPERATIVE DIAGNOSES:  Cervical 4-5 spondylolisthesis.  Cervical 5-6 herniated disk with right side radiculopathy.history of myositis  POSTOPERATIVE DIAGNOSES:  Cervical 4-5 spondylolisthesis. Cervical 5-6 herniated disk with right side radiculopathy.history of myositis  PROCEDURE:  Anterior cervical 4-5, 5-6 diskectomy, decompression of the spinal cord, decompression of the nerve root C5-C6, interbody fusion with cage followed by a plate.muscle biopsy  SURGEON:  Hilda Lias, M.D.  ASSISTANTLottie Rater, MD_________  CLINICAL HISTORY:  [name] is a lady who in the past underwent lumbar fusion by me.  She did really well but now she had been complaining of neck pain, went to the right upper extremity.  The x-ray showed that she has a herniated disk at the level of 5-6 central  to the right, which was symptomatic, but when we did flexion-extension view of the cervical spine, she has quite a bit of spondylolisthesis at the level of cervical 4-5.  Because of that, surgery was advised.  Prior to surgery in the waiting area, the patient told me that she has a family history of some muscle disease and she had been having some patchy atrophy in her body and she was asking for me to go ahead and do a biopsy in her neck muscle because of a history of myositis  DESCRIPTION OF PROCEDURE:  The patient was taken to the OR, and after intubation, the left side of the neck was cleaned with DuraPrep. Transverse incision was made through the skin and subcutaneous tissue straight down to the cervical area.  The patient had previous surgery on the upper pharynx and the patient had  quite a bit of adhesion.  Lysis was accomplished.  First x-ray showed that indeed we were at the level L4-L5.  What I have noticed was that this lady has a quite a bit of overgrowth of the long colli muscle.  We entered the disk space at the L4-5 and it was almost quite empty.  With the help of the microscope, a total diskectomy was achieved with decompression of the cord once we opened the posterior ligament and decompression of the C5 nerve root. At the level of 5-6, the patient has a herniated disk central to the right.  Decompression of the _cord and_________ both C6 nerve root was done. Then, the endplates were drilled and 2 cages of 7 of height lordotic with DBX autograft inside.  We inserted followed by a plate using 6 screws.  X-ray showed good position of the plate and the cages.  From then on, I took a piece of sternocleidomastoid muscle   for biopsy.  It was __covered________immediately with saline and sent to the lab for evaluation. From then on, the area was irrigated.  I just waited 10 minutes just to be sure that we have no any problem with bleeding.  Once the area was dried, the wound was closed  with Vicryl and Steri-Strips.          ______________________________ Hilda Lias, M.D.     EB/MEDQ  D:  08/22/2013  T:  08/22/2013  Job:  [id number]

## 2013-08-23 NOTE — Progress Notes (Signed)
CARE MANAGEMENT NOTE 08/23/2013  Patient:  [name], [name]   Account Number:  [id number]  Date Initiated:  08/23/2013  Documentation initiated by:  Jiles Crocker  Subjective/Objective Assessment:   ADMITTED FOR SURGERY     Action/Plan:   CM FOLLOWING FOR DCP   Anticipated DC Date:  08/28/2013   Anticipated DC Plan:  HOME/SELF CARE    DC Planning Services  CM consult        Status of service:  In process, will continue to follow Medicare Important Message given?   (If response is "NO", the following Medicare IM given date fields will be blank)  Per UR Regulation:  Reviewed for med. necessity/level of care/duration of stay  Comments:  8/19/2015Abelino Derrick RN,BSN,MHA [phone]

## 2013-08-23 NOTE — Evaluation (Signed)
Occupational Therapy Evaluation Patient Details Name: [name] MRN: [id number] DOB: 1960-08-25 Today's Date: 08/23/2013    History of Present Illness pt s/p c4-6 decompression discectomy and fusion   Clinical Impression   Patient is s/p C4-6 surgery resulting in functional limitations due to the deficits listed below (see OT problem list).  Patient will benefit from skilled OT acutely to increase independence and safety with ADLS to allow discharge outpatient.     Follow Up Recommendations  Other (comment) (outpt as recommended by MD after dc)    Equipment Recommendations  None recommended by OT    Recommendations for Other Services       Precautions / Restrictions Precautions Precautions: Cervical Required Braces or Orthoses: Cervical Brace Cervical Brace: Soft collar      Mobility Bed Mobility Overal bed mobility: Needs Assistance Bed Mobility: Rolling;Sidelying to Sit;Sit to Sidelying Rolling: Modified independent (Device/Increase time) Sidelying to sit: Modified independent (Device/Increase time);HOB elevated     Sit to sidelying: Modified independent (Device/Increase time) General bed mobility comments: using bed rail with HOB elevated  Transfers Overall transfer level: Needs assistance   Transfers: Sit to/from Stand Sit to Stand: Supervision              Balance                                            ADL Overall ADL's : Needs assistance/impaired Eating/Feeding: Modified independent;Bed level   Grooming: Oral care;Wash/dry hands;Supervision/safety;Standing Grooming Details (indicate cue type and reason): able to open tooth brush and box to tooth paste Upper Body Bathing: Supervision/ safety;Sitting   Lower Body Bathing: Supervison/ safety;Sit to/from stand       Lower Body Dressing:  (able to cross biL LE)   Toilet Transfer: Supervision/safety;Ambulation;Regular Toilet   Toileting- Architect and  Hygiene: Supervision/safety       Functional mobility during ADLs: Supervision/safety General ADL Comments: Pt is able to complete adls at a supervision level. pt with abdominal discomfort this session. Pt will have son (A). Pt with spouse present in room during session. pt with previous back surg and familiar with precautions     Vision                     Perception     Praxis      Pertinent Vitals/Pain Pain Assessment: 0-10 Pain Score: 5  Pain Location: abdominal pain Pain Descriptors / Indicators: Cramping Pain Intervention(s): Repositioned     Hand Dominance Right   Extremity/Trunk Assessment Upper Extremity Assessment Upper Extremity Assessment: RUE deficits/detail;Generalized weakness (BIL UE tremor noted) RUE Deficits / Details: shoulder flexion Arom 80 degrees. Pt being seen previous at outpatient PT for scapula deficits per patient.  RUE Coordination: decreased gross motor   Lower Extremity Assessment Lower Extremity Assessment: Defer to PT evaluation   Cervical / Trunk Assessment Cervical / Trunk Assessment: Other exceptions (surg)   Communication Communication Communication: No difficulties   Cognition Arousal/Alertness: Awake/alert Behavior During Therapy: WFL for tasks assessed/performed Overall Cognitive Status: Within Functional Limits for tasks assessed                     General Comments       Exercises       Shoulder Instructions      Home Living Family/patient expects to be discharged to:: Private residence  Living Arrangements: Children Available Help at Discharge: Family Type of Home: House Home Access: Stairs to enter Secretary/administrator of Steps: 2 Entrance Stairs-Rails: Right;Left Home Layout: One level     Bathroom Shower/Tub: Chief Strategy Officer: Standard         Additional Comments: Mostly will be assist by her young son,  pt is going through separation      Prior  Functioning/Environment Level of Independence: Independent             OT Diagnosis: Generalized weakness;Acute pain   OT Problem List: Decreased strength;Decreased activity tolerance;Impaired balance (sitting and/or standing);Decreased safety awareness;Decreased knowledge of use of DME or AE;Decreased knowledge of precautions;Pain;Impaired UE functional use   OT Treatment/Interventions: Self-care/ADL training;Therapeutic exercise;DME and/or AE instruction;Therapeutic activities;Patient/family education;Balance training    OT Goals(Current goals can be found in the care plan section) Acute Rehab OT Goals Patient Stated Goal: back independent and able to parent child OT Goal Formulation: With patient Time For Goal Achievement: 09/06/13 Potential to Achieve Goals: Good  OT Frequency: Min 2X/week   Barriers to D/C:            Co-evaluation              End of Session Equipment Utilized During Treatment: Gait belt Nurse Communication: Mobility status;Precautions  Activity Tolerance: Patient tolerated treatment well Patient left: in bed;with call bell/phone within reach;with family/visitor present   Time: 0905-0926 OT Time Calculation (min): 21 min Charges:  OT General Charges $OT Visit: 1 Procedure OT Evaluation $Initial OT Evaluation Tier I: 1 Procedure OT Treatments $Self Care/Home Management : 8-22 mins G-Codes:    Boone Master B 2013-08-31, 11:05 AM Pager: [phone]

## 2013-08-23 NOTE — Progress Notes (Signed)
Physical Therapy Treatment Patient Details Name: SHALEENA CRUSOE MRN: [id number] DOB: 14-Jun-1960 Today's Date: 08/23/2013    History of Present Illness pt s/p c4-6 decompression discectomy and fusion    PT Comments    All education completed.  Pt mobilizing well and will have her son to assist at home as needed.  Follow Up Recommendations  No PT follow up;Supervision for mobility/[name]     Equipment Recommendations  None recommended by PT    Recommendations for Other Services       Precautions / Restrictions Precautions Precautions: Cervical Required Braces or Orthoses: Cervical Brace Cervical Brace: Soft collar    Mobility  Bed Mobility Overal bed mobility: Needs Assistance Bed Mobility: Rolling;Sidelying to Sit;Sit to Sidelying Rolling: Modified independent (Device/Increase time) Sidelying to sit: Modified independent (Device/Increase time);HOB elevated     Sit to sidelying: Modified independent (Device/Increase time)    Transfers Overall transfer level: Needs assistance Equipment used: Rolling walker (2 wheeled) Transfers: Sit to/from Stand Sit to Stand: Supervision         General transfer comment: cued for safe technique; steady assist  Ambulation/Gait Ambulation/Gait assistance: Supervision Ambulation Distance (Feet): 600 Feet Assistive device: None Gait Pattern/deviations: Step-through pattern   Gait velocity interpretation: at or above normal speed for age/gender General Gait Details: steady, but when fatigue sets in pt's R knee buckles   Stairs Stairs: Yes Stairs assistance: Supervision Stair Management: One rail Right;Alternating pattern;Forwards Number of Stairs: 6 General stair comments: steady with rail  Wheelchair Mobility    Modified Rankin (Stroke Patients Only)       Balance Overall balance assessment: No apparent balance deficits (not formally assessed)                                  Cognition  Arousal/Alertness: Awake/alert Behavior During Therapy: WFL for tasks assessed/performed Overall Cognitive Status: Within Functional Limits for tasks assessed                      Exercises      General Comments General comments (skin integrity, edema, etc.): reinforced cervical precautions, log roll, lifting restrictions, bracing issues, progression of activity      Pertinent Vitals/Pain Pain Score: 5     Home Living                      Prior Function            PT Goals (current goals can now be found in the care plan section) Acute Rehab PT Goals Patient Stated Goal: back independent and able to parent child PT Goal Formulation: With patient Time For Goal Achievement: 08/29/13 Potential to Achieve Goals: Good Progress towards PT goals: Progressing toward goals    Frequency  Min 5X/week    PT Plan Current plan remains appropriate    Co-evaluation             End of Session Equipment Utilized During Treatment: Cervical collar Activity Tolerance: Patient tolerated treatment well Patient left: in bed;with call bell/phone within reach;with family/visitor present     Time: 1341-1405 PT Time Calculation (min): 24 min  Charges:  $Gait Training: 8-22 mins $Self Care/Home Management: 09/16/2022                    G Codes:      Chancie Lampert, [name] 08/23/2013, 2:11 PM 08/23/2013  [place], PT  [phone]  (pager)

## 2013-08-24 ENCOUNTER — Encounter: Payer: 59 | Admitting: Physical Therapy

## 2013-08-24 MED ORDER — DIAZEPAM 5 MG PO TABS: 5.0000 mg | ORAL_TABLET | Freq: Four times a day (QID) | ORAL | Status: DC | PRN

## 2013-08-24 NOTE — Discharge Summary (Signed)
Physician Discharge Summary  Patient ID: [name] MRN: [id number] DOB/AGE: [birth year] 53 y.o.  Admit date: 08/22/2013 Discharge date: 08/24/2013  Admission Diagnoses:cervical stenosis  Discharge Diagnoses:  Active Problems:   Cervical stenosis of spinal canal   Discharged Condition: no pain  Hospital Course: surgery   Consults: none  Significant Diagnostic Studies: mri  Treatments:anterior cervical  fusion  Discharge Exam: Blood pressure 121/74, pulse 64, temperature 98.1 F (36.7 C), temperature source Oral, resp. rate 20, height 5' 5.5" (1.664 m), weight 75.9 kg (167 lb 5.3 oz), SpO2 98.00%. No weakness  Disposition: home     Medication List    ASK your doctor about these medications       acetaminophen 650 MG CR tablet  Commonly known as:  TYLENOL  Take 650 mg by mouth every 8 (eight) hours as needed for pain.     ATELVIA 35 MG Tbec  Generic drug:  Risedronate Sodium  Take 35 mg by mouth once a week. Taken on Wednesdays.     B-complex with vitamin C tablet  Take 1 tablet by mouth daily.     Calcium Carb-Cholecalciferol [phone] MG-UNIT Tabs  Take 1 tablet by mouth daily.     Calcium-Magnesium-Zinc 333-133-5 MG Tabs  Take 1 tablet by mouth 3 (three) times daily.     cetirizine 10 MG tablet  Commonly known as:  ZYRTEC  Take 10 mg by mouth at bedtime.     Coconut Oil 1000 MG Caps  Take 1,000 mg by mouth 2 (two) times daily.     colchicine 0.6 MG tablet  Take 1 tablet (0.6 mg total) by mouth 2 (two) times daily.     cyclobenzaprine 5 MG tablet  Commonly known as:  FLEXERIL  Take 5 mg by mouth 2 (two) times daily.     diazepam 5 MG tablet  Commonly known as:  VALIUM  Take 1 tablet (5 mg total) by mouth every 6 (six) hours as needed for muscle spasms.     folic acid 1 MG tablet  Commonly known as:  FOLVITE  Take 2 mg by mouth daily.     HYDROcodone-acetaminophen 10-325 MG per tablet  Commonly known as:  NORCO  Take 1-2 tablets by  mouth every 4 (four) hours as needed for moderate pain.     lansoprazole 15 MG capsule  Commonly known as:  PREVACID  Take 15 mg by mouth at bedtime.     Methotrexate Sodium (PF) 50 MG/2ML Soln  Inject 0.8 mLs into the skin once a week. On Wednesday.     methotrexate 25 MG/ML injection  Inject into the skin.     multivitamin with minerals Tabs tablet  Take 1 tablet by mouth daily.     predniSONE 5 MG tablet  Commonly known as:  DELTASONE  Take 5 mg by mouth daily with breakfast.     REFRESH OP  Place 1 drop into both eyes 2 (two) times daily as needed (dry eyes).     Vitamin B-12 CR 1000 MCG Tbcr  Take 1,000 mcg by mouth daily.         Signed: Karn Cassis 08/24/2013, 9:04 AM

## 2013-08-24 NOTE — Progress Notes (Signed)
Pt d/c home to self care d/c instruction given and pt verbalized good understanding  . Condition at discharge is stable

## 2013-09-12 ENCOUNTER — Encounter (HOSPITAL_COMMUNITY): Payer: Self-pay

## 2013-09-14 ENCOUNTER — Encounter (HOSPITAL_COMMUNITY): Payer: Self-pay

## 2013-09-14 ENCOUNTER — Other Ambulatory Visit: Payer: Self-pay | Admitting: *Deleted

## 2013-09-14 DIAGNOSIS — R55 Syncope and collapse: Secondary | ICD-10-CM

## 2013-09-15 ENCOUNTER — Other Ambulatory Visit: Payer: Self-pay | Admitting: *Deleted

## 2013-09-15 DIAGNOSIS — R55 Syncope and collapse: Secondary | ICD-10-CM

## 2013-09-16 ENCOUNTER — Emergency Department (HOSPITAL_COMMUNITY): Payer: 59

## 2013-09-16 ENCOUNTER — Inpatient Hospital Stay (HOSPITAL_COMMUNITY)
Admission: EM | Admit: 2013-09-16 | Discharge: 2013-09-19 | DRG: 287 | Disposition: A | Discharge status: Home / Self Care | Payer: 59 | Attending: Internal Medicine | Admitting: Internal Medicine

## 2013-09-16 ENCOUNTER — Encounter (HOSPITAL_COMMUNITY): Payer: Self-pay | Admitting: Emergency Medicine

## 2013-09-16 DIAGNOSIS — R000 Tachycardia, unspecified: Secondary | ICD-10-CM

## 2013-09-16 DIAGNOSIS — R079 Chest pain, unspecified: Secondary | ICD-10-CM

## 2013-09-16 DIAGNOSIS — E273 Drug-induced adrenocortical insufficiency: Secondary | ICD-10-CM

## 2013-09-16 DIAGNOSIS — R55 Syncope and collapse: Secondary | ICD-10-CM

## 2013-09-16 DIAGNOSIS — I472 Ventricular tachycardia, unspecified: Secondary | ICD-10-CM | POA: Diagnosis not present

## 2013-09-16 DIAGNOSIS — M949 Disorder of cartilage, unspecified: Secondary | ICD-10-CM

## 2013-09-16 DIAGNOSIS — M899 Disorder of bone, unspecified: Secondary | ICD-10-CM | POA: Diagnosis present

## 2013-09-16 DIAGNOSIS — N840 Polyp of corpus uteri: Secondary | ICD-10-CM

## 2013-09-16 DIAGNOSIS — R0789 Other chest pain: Principal | ICD-10-CM | POA: Diagnosis present

## 2013-09-16 DIAGNOSIS — H5310 Unspecified subjective visual disturbances: Secondary | ICD-10-CM

## 2013-09-16 DIAGNOSIS — Z79899 Other long term (current) drug therapy: Secondary | ICD-10-CM

## 2013-09-16 DIAGNOSIS — M352 Behcet's disease: Secondary | ICD-10-CM

## 2013-09-16 DIAGNOSIS — Z8249 Family history of ischemic heart disease and other diseases of the circulatory system: Secondary | ICD-10-CM

## 2013-09-16 DIAGNOSIS — K219 Gastro-esophageal reflux disease without esophagitis: Secondary | ICD-10-CM | POA: Diagnosis present

## 2013-09-16 DIAGNOSIS — Z981 Arthrodesis status: Secondary | ICD-10-CM

## 2013-09-16 DIAGNOSIS — I951 Orthostatic hypotension: Secondary | ICD-10-CM | POA: Diagnosis present

## 2013-09-16 DIAGNOSIS — E2749 Other adrenocortical insufficiency: Secondary | ICD-10-CM | POA: Diagnosis present

## 2013-09-16 DIAGNOSIS — T380X5A Adverse effect of glucocorticoids and synthetic analogues, initial encounter: Secondary | ICD-10-CM

## 2013-09-16 DIAGNOSIS — IMO0002 Reserved for concepts with insufficient information to code with codable children: Secondary | ICD-10-CM

## 2013-09-16 DIAGNOSIS — N809 Endometriosis, unspecified: Secondary | ICD-10-CM

## 2013-09-16 DIAGNOSIS — K589 Irritable bowel syndrome without diarrhea: Secondary | ICD-10-CM

## 2013-09-16 DIAGNOSIS — I498 Other specified cardiac arrhythmias: Secondary | ICD-10-CM | POA: Diagnosis present

## 2013-09-16 DIAGNOSIS — I4729 Other ventricular tachycardia: Secondary | ICD-10-CM | POA: Diagnosis not present

## 2013-09-16 DIAGNOSIS — K529 Noninfective gastroenteritis and colitis, unspecified: Secondary | ICD-10-CM

## 2013-09-16 DIAGNOSIS — R7302 Impaired glucose tolerance (oral): Secondary | ICD-10-CM

## 2013-09-16 DIAGNOSIS — M5136 Other intervertebral disc degeneration, lumbar region: Secondary | ICD-10-CM

## 2013-09-16 DIAGNOSIS — M8580 Other specified disorders of bone density and structure, unspecified site: Secondary | ICD-10-CM

## 2013-09-16 DIAGNOSIS — R002 Palpitations: Secondary | ICD-10-CM | POA: Diagnosis present

## 2013-09-16 DIAGNOSIS — M4802 Spinal stenosis, cervical region: Secondary | ICD-10-CM

## 2013-09-16 DIAGNOSIS — M51369 Other intervertebral disc degeneration, lumbar region without mention of lumbar back pain or lower extremity pain: Secondary | ICD-10-CM

## 2013-09-16 HISTORY — DX: Cervicalgia: M54.2

## 2013-09-16 HISTORY — DX: Dorsalgia, unspecified: M54.9

## 2013-09-16 HISTORY — DX: Other chronic pain: G89.29

## 2013-09-16 LAB — CBC
HCT: 36.9 % (ref 36.0–46.0)
HCT: 35.7 % — ABNORMAL LOW (ref 36.0–46.0)
Hemoglobin: 12.7 g/dL (ref 12.0–15.0)
Hemoglobin: 12.1 g/dL (ref 12.0–15.0)
MCH: 30.8 pg (ref 26.0–34.0)
MCH: 30 pg (ref 26.0–34.0)
MCHC: 34.4 g/dL (ref 30.0–36.0)
MCHC: 33.9 g/dL (ref 30.0–36.0)
MCV: 89.3 fL (ref 78.0–100.0)
MCV: 88.6 fL (ref 78.0–100.0)
Platelets: 220 10*3/uL (ref 150–400)
Platelets: 195 10*3/uL (ref 150–400)
RBC: 4.13 MIL/uL (ref 3.87–5.11)
RBC: 4.03 MIL/uL (ref 3.87–5.11)
RDW: 12.5 % (ref 11.5–15.5)
RDW: 12.5 % (ref 11.5–15.5)
WBC: 5.5 10*3/uL (ref 4.0–10.5)
WBC: 5.4 10*3/uL (ref 4.0–10.5)

## 2013-09-16 LAB — PHOSPHORUS: Phosphorus: 3.7 mg/dL (ref 2.3–4.6)

## 2013-09-16 LAB — BASIC METABOLIC PANEL
Anion gap: 12 (ref 5–15)
BUN: 14 mg/dL (ref 6–23)
CO2: 28 mEq/L (ref 19–32)
Calcium: 9.5 mg/dL (ref 8.4–10.5)
Chloride: 104 mEq/L (ref 96–112)
Creatinine, Ser: 0.51 mg/dL (ref 0.50–1.10)
GFR calc Af Amer: >90 mL/min (ref >90)
GFR calc non Af Amer: >90 mL/min (ref >90)
Glucose, Bld: 95 mg/dL (ref 70–99)
Potassium: 3.7 mEq/L (ref 3.7–5.3)
Sodium: 144 mEq/L (ref 137–147)

## 2013-09-16 LAB — CREATININE, SERUM
Creatinine, Ser: 0.52 mg/dL (ref 0.50–1.10)
GFR calc Af Amer: >90 mL/min (ref >90)
GFR calc non Af Amer: >90 mL/min (ref >90)

## 2013-09-16 LAB — TROPONIN I
Troponin I: <0.3 ng/mL (ref <0.30)
Troponin I: <0.3 ng/mL (ref <0.30)
Troponin I: <0.3 ng/mL (ref <0.30)

## 2013-09-16 LAB — PRO B NATRIURETIC PEPTIDE: Pro B Natriuretic peptide (BNP): 6.6 pg/mL (ref 0–125)

## 2013-09-16 LAB — MAGNESIUM: Magnesium: 1.8 mg/dL (ref 1.5–2.5)

## 2013-09-16 LAB — D-DIMER, QUANTITATIVE: D-Dimer, Quant: 0.3 ug/mL-FEU (ref 0.00–0.48)

## 2013-09-16 MED ORDER — DIAZEPAM 5 MG PO TABS
5.0000 mg | ORAL_TABLET | Freq: Four times a day (QID) | ORAL | Status: DC | PRN
  Administered 2013-09-16 – 2013-09-17 (×3): 5 mg via ORAL
  Filled 2013-09-16 (×3): qty 1

## 2013-09-16 MED ORDER — ACETAMINOPHEN ER 650 MG PO TBCR: 650.0000 mg | EXTENDED_RELEASE_TABLET | Freq: Three times a day (TID) | ORAL | Status: DC | PRN

## 2013-09-16 MED ORDER — CALCIUM CARBONATE ANTACID 500 MG PO CHEW
1.0000 | CHEWABLE_TABLET | Freq: Every day | ORAL | Status: DC
  Administered 2013-09-17 – 2013-09-19 (×3): 200 mg via ORAL
  Filled 2013-09-16 (×4): qty 1

## 2013-09-16 MED ORDER — CYCLOBENZAPRINE HCL 5 MG PO TABS
5.0000 mg | ORAL_TABLET | Freq: Two times a day (BID) | ORAL | Status: DC
  Administered 2013-09-17 – 2013-09-19 (×3): 5 mg via ORAL
  Filled 2013-09-16 (×7): qty 1

## 2013-09-16 MED ORDER — SODIUM CHLORIDE 0.9 % IV SOLN
INTRAVENOUS | Status: DC
  Administered 2013-09-16 – 2013-09-17 (×3): via INTRAVENOUS

## 2013-09-16 MED ORDER — VITAMIN B-12 ER 1000 MCG PO TBCR: 1000.0000 ug | EXTENDED_RELEASE_TABLET | Freq: Every day | ORAL | Status: DC

## 2013-09-16 MED ORDER — CALCIUM CARB-CHOLECALCIFEROL 600-1000 MG-UNIT PO TABS: 1.0000 | ORAL_TABLET | Freq: Every day | ORAL | Status: DC

## 2013-09-16 MED ORDER — HYDROCODONE-ACETAMINOPHEN 5-325 MG PO TABS
1.0000 | ORAL_TABLET | Freq: Four times a day (QID) | ORAL | Status: DC | PRN
  Administered 2013-09-18: 1 via ORAL
  Filled 2013-09-16: qty 1

## 2013-09-16 MED ORDER — PREDNISONE 10 MG PO TABS
10.0000 mg | ORAL_TABLET | Freq: Every day | ORAL | Status: DC
  Administered 2013-09-17 – 2013-09-19 (×3): 10 mg via ORAL
  Filled 2013-09-16 (×4): qty 1

## 2013-09-16 MED ORDER — COLCHICINE 0.6 MG PO TABS
0.6000 mg | ORAL_TABLET | Freq: Two times a day (BID) | ORAL | Status: DC
  Administered 2013-09-16 – 2013-09-19 (×6): 0.6 mg via ORAL
  Filled 2013-09-16 (×8): qty 1

## 2013-09-16 MED ORDER — ONDANSETRON HCL 4 MG/2ML IJ SOLN: 4.0000 mg | Freq: Four times a day (QID) | INTRAMUSCULAR | Status: DC | PRN

## 2013-09-16 MED ORDER — VITAMIN B-12 1000 MCG PO TABS
1000.0000 ug | ORAL_TABLET | Freq: Every day | ORAL | Status: DC
  Administered 2013-09-17 – 2013-09-19 (×3): 1000 ug via ORAL
  Filled 2013-09-16 (×4): qty 1

## 2013-09-16 MED ORDER — SODIUM CHLORIDE 0.9 % IV BOLUS (SEPSIS)
500.0000 mL | Freq: Once | INTRAVENOUS | Status: AC
  Administered 2013-09-16: 500 mL via INTRAVENOUS

## 2013-09-16 MED ORDER — FOLIC ACID 1 MG PO TABS
2.0000 mg | ORAL_TABLET | Freq: Every day | ORAL | Status: DC
  Administered 2013-09-17 – 2013-09-19 (×3): 2 mg via ORAL
  Filled 2013-09-16 (×4): qty 2

## 2013-09-16 MED ORDER — ACETAMINOPHEN 325 MG PO TABS
650.0000 mg | ORAL_TABLET | Freq: Three times a day (TID) | ORAL | Status: DC | PRN
  Administered 2013-09-16 – 2013-09-19 (×3): 650 mg via ORAL
  Filled 2013-09-16 (×3): qty 2

## 2013-09-16 MED ORDER — ENOXAPARIN SODIUM 40 MG/0.4ML ~~LOC~~ SOLN
40.0000 mg | SUBCUTANEOUS | Status: DC
  Administered 2013-09-16 – 2013-09-18 (×3): 40 mg via SUBCUTANEOUS
  Filled 2013-09-16 (×4): qty 0.4

## 2013-09-16 MED ORDER — FLUDROCORTISONE ACETATE 0.1 MG PO TABS
0.1000 mg | ORAL_TABLET | Freq: Every day | ORAL | Status: DC
  Administered 2013-09-17 – 2013-09-19 (×3): 0.1 mg via ORAL
  Filled 2013-09-16 (×3): qty 1

## 2013-09-16 MED ORDER — VITAMIN D3 25 MCG (1000 UNIT) PO TABS
1000.0000 [IU] | ORAL_TABLET | Freq: Every day | ORAL | Status: DC
  Administered 2013-09-17 – 2013-09-19 (×3): 1000 [IU] via ORAL
  Filled 2013-09-16 (×4): qty 1

## 2013-09-16 MED ORDER — CETIRIZINE HCL 10 MG PO TABS
10.0000 mg | ORAL_TABLET | Freq: Every day | ORAL | Status: DC
  Administered 2013-09-16 – 2013-09-18 (×3): 10 mg via ORAL
  Filled 2013-09-16 (×4): qty 1

## 2013-09-16 MED ORDER — LORATADINE 10 MG PO TABS
10.0000 mg | ORAL_TABLET | Freq: Every day | ORAL | Status: DC
  Filled 2013-09-16: qty 1

## 2013-09-16 MED ORDER — PANTOPRAZOLE SODIUM 40 MG PO TBEC
40.0000 mg | DELAYED_RELEASE_TABLET | Freq: Every day | ORAL | Status: DC
  Administered 2013-09-16 – 2013-09-19 (×4): 40 mg via ORAL
  Filled 2013-09-16 (×4): qty 1

## 2013-09-16 MED ORDER — ADULT MULTIVITAMIN W/MINERALS CH
1.0000 | ORAL_TABLET | Freq: Every day | ORAL | Status: DC
  Administered 2013-09-17 – 2013-09-19 (×3): 1 via ORAL
  Filled 2013-09-16 (×4): qty 1

## 2013-09-16 NOTE — H&P (Signed)
[facility] History and Physical  Toshua Honsinger [name] OFB:[id number] DOB: 1960-01-08 DOA: 09/16/2013  Referring physician:  PCP: Pearson Grippe, MD  Specialists:   Chief Complaint: Palpitations and chest pain  HPI: [name] is a 53 y.o. female  With a history of recent cervical spine surgery, Behcet's syndrome on chronic steroids, irritable bowel syndrome, acid reflux, that presented to the emergency department with complaints of palpitations and near syncope. Patient states her symptoms began several days after her cervical spine surgery. She has been symptomatic for approximately 2 weeks, complained of becoming short of breath after walking short distances, lightheadedness, dizziness, flushing, nausea. The symptoms are relieved with rest with sitting down as well as lying down. Patient has had chest pain which she has noticed more after decreasing her pain medication chest abdomen chest which radiates to her left arm. She states it is more of a pressure.  It is relieved with rest. Patient has a very extensive history with autoimmune disease with. There was some change in her steroid dosage recently, and patient does not believe she received no steroids after her surgery. Patient denies any recent travel, ill contacts.  Review of Systems:  Constitutional: Denies fever, chills, diaphoresis, appetite change and fatigue.  HEENT: Denies photophobia, eye pain, redness, hearing loss, ear pain, congestion, sore throat, rhinorrhea, sneezing, mouth sores, trouble swallowing, neck pain, neck stiffness and tinnitus.   Respiratory: Denies SOB, DOE, cough, chest tightness,  and wheezing.   Cardiovascular: Complains of occupation and chest pain Gastrointestinal: Denies nausea, vomiting, abdominal pain, diarrhea, constipation, blood in stool and abdominal distention.  Genitourinary: Denies dysuria, urgency, frequency, hematuria, flank pain and difficulty urinating.  Musculoskeletal: Denies  myalgias, back pain, joint swelling, arthralgias and gait problem.  Skin: Denies pallor, rash and wound.  Neurological: Denies dizziness, seizures, syncope, weakness, light-headedness, numbness and headaches.  Hematological: Denies adenopathy. Easy bruising, personal or family bleeding history  Psychiatric/Behavioral: Denies suicidal ideation, mood changes, confusion, nervousness, sleep disturbance and agitation  Past Medical History  Diagnosis Date  . Endometriosis   . Endometrial polyp   . IBS (irritable bowel syndrome)   . Acid reflux   . Colitis   . Behcet's syndrome   . GERD (gastroesophageal reflux disease)   . Uveitis   . Osteopenia   . Adrenal insufficiency   . Subjective visual disturbance 07/14/2012  . Tear of lateral meniscus of right knee, current     Tear of lateral & medial meniscus of right knee  . Asthma   . PONV (postoperative nausea and vomiting)     also low blood pressure at times  . Pneumonia     20 years ago  . Refusal of blood transfusions as patient is Jehovah's Witness   . Chronic back pain   . Chronic neck pain    Past Surgical History  Procedure Laterality Date  . Cholecystectomy  12/2004  . Pelvic laparoscopy      DL  . Dilation and curettage of uterus    . Hysteroscopy    . Cesarean section  03/2001  . Knee surgery Left 05/1998  . Hand surgery    . Breast surgery      Cysts excised  . Oophorectomy      BSO  . Vaginal hysterectomy  07/2007    LAVH BSO  . Back surgery    . Knee arthroscopy with lateral menisectomy Right 12/12/2012    Procedure: KNEE ARTHROSCOPY WITH LATERAL MENISECTOMY;  Surgeon: Nilda Simmer, MD;  Location:  [facility];  Service: Orthopedics;  Laterality: Right;  . Thyroid cyst excision    . Anterior fusion cervical spine  08/22/2013    c4 5  6         . Anterior cervical decomp/discectomy fusion N/A 08/22/2013    Procedure: ANTERIOR CERVICAL DECOMPRESSION/DISCECTOMY FUSION 2 LEVELS;  Surgeon: Karn Cassis, MD;  Location: [facility];  Service: Neurosurgery;  Laterality: N/A;  C4-5 C5-6 Anterior cervical decompression/diskectomy/fusion   Social History:  reports that she has never smoked. She has never used smokeless tobacco. She reports that she does not drink alcohol or use illicit drugs.   Allergies  Allergen Reactions  . Celebrex [Celecoxib] Other (See Comments)    Bleeding/bruising including bleeding for multiple days  . Latex Shortness Of Breath and Dermatitis  . Sulfa Drugs Cross Reactors Other (See Comments)    Eyes turn red, renal failure, stomach problems (vomiting, diarrhea), vasculature collapses, migraine like symptoms  . Sulfites Other (See Comments)    Eyes turn red, renal failure, stomach problems (vomiting, diarrhea), vasculature collapses, migraine like symptoms  . Fentanyl Nausea And Vomiting  . Simponi [Golimumab] Swelling    Simponi Aria- Headache, blurred vision.  Marland Kitchen Epinephrine Other (See Comments)    Tachycardia  . Erythromycin Itching and Rash    Has tolerated azithromycin  . Morphine And Related Itching and Rash    Includes codeine; can take hydrocodone    Family History  Problem Relation Age of Onset  . Diabetes Mother   . Breast cancer Mother     Age 9's  . Stroke Mother   . Gastric cancer Father   . Diabetes Sister   . Hypertension Sister   . Breast cancer Sister     Age 64    Prior to Admission medications   Medication Sig Start Date End Date Taking? Authorizing Provider  acetaminophen (TYLENOL) 650 MG CR tablet Take 650 mg by mouth every 8 (eight) hours as needed for pain.    Historical Provider, MD  B Complex-C (B-COMPLEX WITH VITAMIN C) tablet Take 1 tablet by mouth daily.    Historical Provider, MD  Calcium Carb-Cholecalciferol [phone] MG-UNIT TABS Take 1 tablet by mouth daily.     Historical Provider, MD  Calcium-Magnesium-Zinc [phone] MG TABS Take 1 tablet by mouth 3 (three) times daily.  12/19/10   Sanjuana Letters, MD    cetirizine (ZYRTEC) 10 MG tablet Take 10 mg by mouth at bedtime.    Historical Provider, MD  Coconut Oil 1000 MG CAPS Take 1,000 mg by mouth 2 (two) times daily.    Historical Provider, MD  colchicine 0.6 MG tablet Take 1 tablet (0.6 mg total) by mouth 2 (two) times daily. 12/19/10   Sanjuana Letters, MD  Cyanocobalamin (VITAMIN B-12 CR) 1000 MCG TBCR Take 1,000 mcg by mouth daily.     Historical Provider, MD  cyclobenzaprine (FLEXERIL) 5 MG tablet Take 5 mg by mouth 2 (two) times daily.     Historical Provider, MD  diazepam (VALIUM) 5 MG tablet Take 1 tablet (5 mg total) by mouth every 6 (six) hours as needed for muscle spasms. 01/04/13   Tia Alert, MD  diazepam (VALIUM) 5 MG tablet Take 1 tablet (5 mg total) by mouth every 6 (six) hours as needed for muscle spasms. 08/24/13   Karn Cassis, MD  folic acid (FOLVITE) 1 MG tablet Take 2 mg by mouth daily.  05/15/13 05/15/14  Historical Provider, MD  HYDROcodone-acetaminophen (NORCO) 10-325 MG per tablet Take 1-2 tablets by mouth every 4 (four) hours as needed for moderate pain. 01/04/13   Tia Alert, MD  lansoprazole (PREVACID) 15 MG capsule Take 15 mg by mouth at bedtime.     Historical Provider, MD  methotrexate 25 MG/ML injection Inject into the skin. 05/15/13 01/29/15  Historical Provider, MD  Methotrexate Sodium, PF, 50 MG/2ML SOLN Inject 0.8 mLs into the skin once a week. On Wednesday.    Historical Provider, MD  Multiple Vitamin (MULTIVITAMIN WITH MINERALS) TABS Take 1 tablet by mouth daily.    Historical Provider, MD  Polyvinyl Alcohol-Povidone (REFRESH OP) Place 1 drop into both eyes 2 (two) times daily as needed (dry eyes).    Historical Provider, MD  predniSONE (DELTASONE) 5 MG tablet Take 5 mg by mouth daily with breakfast.    Historical Provider, MD  Risedronate Sodium (ATELVIA) 35 MG TBEC Take 35 mg by mouth once a week. Taken on Wednesdays.    Historical Provider, MD   Physical Exam: Filed Vitals:   09/16/13 1400  BP:  120/74  Pulse: 87  Temp:   Resp: 16     General: Well developed, well nourished, NAD, appears stated age  HEENT: NCAT, PERRLA, EOMI, Anicteic Sclera, mucous membranes moist.   Neck: Supple, no JVD, no masses, scar- well healing  Cardiovascular: S1 S2 auscultated, no rubs, murmurs or gallops. Regular rate and rhythm.  Respiratory: Clear to auscultation bilaterally with equal chest rise  Abdomen: Soft, RLQ tenderness, nondistended, + bowel sounds  Extremities: warm dry without cyanosis clubbing or edema  Neuro: AAOx3, cranial nerves grossly intact. Strength 5/5 in patient's upper and lower extremities bilaterally  Skin: Without rashes exudates or nodules  Psych: Anxious affect and demeanor with intact judgement and insight  Labs on Admission:  Basic Metabolic Panel:  Recent Labs Lab 09/16/13 1128  NA 144  K 3.7  CL 104  CO2 28  GLUCOSE 95  BUN 14  CREATININE 0.51  CALCIUM 9.5   Liver Function Tests: No results found for this basename: AST, ALT, ALKPHOS, BILITOT, PROT, ALBUMIN,  in the last 168 hours No results found for this basename: LIPASE, AMYLASE,  in the last 168 hours No results found for this basename: AMMONIA,  in the last 168 hours CBC:  Recent Labs Lab 09/16/13 1128  WBC 5.5  HGB 12.7  HCT 36.9  MCV 89.3  PLT 220   Cardiac Enzymes:  Recent Labs Lab 09/16/13 1128  TROPONINI <0.30    BNP (last 3 results)  Recent Labs  09/16/13 1128  PROBNP 6.6   CBG: No results found for this basename: GLUCAP,  in the last 168 hours  Radiological Exams on Admission: Dg Chest 2 View  09/16/2013   CLINICAL DATA:  Sternal chest pain radiating to the left scapula and left arm. Near syncope.  EXAM: CHEST  2 VIEW  COMPARISON:  08/13/2010  FINDINGS: The heart size and mediastinal contours are within normal limits. Both lungs are clear. The visualized skeletal structures are unremarkable.  IMPRESSION: No active cardiopulmonary disease.   Electronically Signed    By: Geanie Cooley M.D.   On: 09/16/2013 12:23    EKG: Independently reviewed. Sinus tachycardia rate 110  Assessment/Plan  Palpitations/chest pain/syncope -Patient will be admitted to telemetry to monitor for any tachycardia or bradyarrhythmia -She was tachycardic upon arrival to emergency department -Cardiology has been consulted -Currently chest pain-free, patient's chest pain does seem atypical however she does have a  strong family history including early age of cardiac death. -First troponin negative, will continue to cycle and monitor -Will obtain echocardiogram -Cardiology consulted and appreciated and recommends tilt table test as well as lexiscan -This possibly could be secondary to adrenal insufficiency, as patient has been on chronic and long-term steroid use, likely exacerbated by her surgery -A.m. cortisol level -Continue fludrocortisone as well as prednisone, start on Sunday -Continue IV fluid -BNP as well d-dimer negative  Behcet's -Will hold methotrexate -Continue colchicine and prednisone  GERD -Continue PPI   DVT prophylaxis: Lovenox  Code Status: Full  Condition: Guarded  Family Communication: Family at bedside. Admission, patients condition and plan of care including tests being ordered have been discussed with the patient and family who indicate understanding and agree with the plan and Code Status.  Disposition Plan: Admitted  Time spent: 65 minutes  Quamesha Mullet D.O. [facility] Pager [phone]  If 7PM-7AM, please contact night-coverage [email or web address] Password [facility] 09/16/2013, 2:33 PM

## 2013-09-16 NOTE — ED Notes (Signed)
She reports chest pain and L arm pain onset last week. Shes continued to have pain and "feel my heart racing" since. shes had approximately 3 episodes of dizziness and near syncope as well. She went to PCP they did EKGs that were normal and they referred her to [name] cardiology. They set up and appt for her to see dr [name] next week. She recently had neck surgery and felt like the pain started after she stopped taking the pain medication post surgery

## 2013-09-16 NOTE — ED Provider Notes (Signed)
CSN: [id number]     Arrival date & time 09/16/13  1045 History   First MD Initiated Contact with Patient 09/16/13 1113     Chief Complaint  Patient presents with  . Chest Pain     HPI Pt was seen at 1115. Per pt, c/o gradual onset and worsening of multiple intermittent episodes of palpitations for the past 1 week. Pt describes her palpitations as "my heart racing." Has been associated with mid-sternal chest "pain," as well as SOB, "feeling clammy." Pt states she has had several episodes of lightheadedness/near syncope as well as 2 episodes of brief syncope. Pt states her symptoms occur on exertion (walking), improve at rest. States she has been walking shorter and shorter distances due to her symptoms. Pt has been evaluated by her PMD for same and referred to Cards MD next week to have a Holter monitor placed. Pt states her symptoms are "getting worse" and she "can hardly walk to the bathroom now" without her symptoms occurring. Denies cough, no N/V/D, no abd pain, no back pain, no fevers, no rash, no calf/LE pain or unilateral swelling.    Past Medical History  Diagnosis Date  . Endometriosis   . Endometrial polyp   . IBS (irritable bowel syndrome)   . Acid reflux   . Colitis   . Behcet's syndrome   . GERD (gastroesophageal reflux disease)   . Uveitis   . Osteopenia   . Adrenal insufficiency   . Subjective visual disturbance 07/14/2012  . Tear of lateral meniscus of right knee, current     Tear of lateral & medial meniscus of right knee  . Asthma   . PONV (postoperative nausea and vomiting)     also low blood pressure at times  . Pneumonia     20 years ago  . Refusal of blood transfusions as patient is Jehovah's Witness   . Chronic back pain   . Chronic neck pain    Past Surgical History  Procedure Laterality Date  . Cholecystectomy  12/2004  . Pelvic laparoscopy      DL  . Dilation and curettage of uterus    . Hysteroscopy    . Cesarean section  03/2001  . Knee surgery  Left 05/1998  . Hand surgery    . Breast surgery      Cysts excised  . Oophorectomy      BSO  . Vaginal hysterectomy  07/2007    LAVH BSO  . Back surgery    . Knee arthroscopy with lateral menisectomy Right 12/12/2012    Procedure: KNEE ARTHROSCOPY WITH LATERAL MENISECTOMY;  Surgeon: Nilda Simmer, MD;  Location: [facility];  Service: Orthopedics;  Laterality: Right;  . Thyroid cyst excision    . Anterior fusion cervical spine  08/22/2013    c4 5  6         . Anterior cervical decomp/discectomy fusion N/A 08/22/2013    Procedure: ANTERIOR CERVICAL DECOMPRESSION/DISCECTOMY FUSION 2 LEVELS;  Surgeon: Karn Cassis, MD;  Location: [facility];  Service: Neurosurgery;  Laterality: N/A;  C4-5 C5-6 Anterior cervical decompression/diskectomy/fusion   Family History  Problem Relation Age of Onset  . Diabetes Mother   . Breast cancer Mother     Age 58's  . Stroke Mother   . Gastric cancer Father   . Diabetes Sister   . Hypertension Sister   . Breast cancer Sister     Age 42   History  Substance Use Topics  .  Smoking status: Never Smoker   . Smokeless tobacco: Never Used  . Alcohol Use: No   OB History   Grav Para Term Preterm Abortions TAB SAB Ect Mult Living   3 1 1  2     1      Review of Systems ROS: Statement: All systems negative except as marked or noted in the HPI; Constitutional: Negative for fever and chills. ; ; Eyes: Negative for eye pain, redness and discharge. ; ; ENMT: Negative for ear pain, hoarseness, nasal congestion, sinus pressure and sore throat. ; ; Cardiovascular: +CP, palpitations, "clammy." Negative for dyspnea and peripheral edema. ; ; Respiratory: Negative for cough, wheezing and stridor. ; ; Gastrointestinal: Negative for nausea, vomiting, diarrhea, abdominal pain, blood in stool, hematemesis, jaundice and rectal bleeding. . ; ; Genitourinary: Negative for dysuria, flank pain and hematuria. ; ; Musculoskeletal: Negative for back pain and neck  pain. Negative for swelling and trauma.; ; Skin: Negative for pruritus, rash, abrasions, blisters, bruising and skin lesion.; ; Neuro: Negative for headache, and neck stiffness. Negative for extremity weakness, paresthesias, involuntary movement, seizure and +lightheaded, weakness, syncope.      Allergies  Celebrex; Latex; Sulfa drugs cross reactors; Sulfites; Fentanyl; Simponi; Epinephrine; Erythromycin; and Morphine and related  Home Medications   Prior to Admission medications   Medication Sig Start Date End Date Taking? Authorizing Provider  acetaminophen (TYLENOL) 650 MG CR tablet Take 650 mg by mouth every 8 (eight) hours as needed for pain.    Historical Provider, MD  B Complex-C (B-COMPLEX WITH VITAMIN C) tablet Take 1 tablet by mouth daily.    Historical Provider, MD  Calcium Carb-Cholecalciferol [phone] MG-UNIT TABS Take 1 tablet by mouth daily.     Historical Provider, MD  Calcium-Magnesium-Zinc [phone] MG TABS Take 1 tablet by mouth 3 (three) times daily.  12/19/10   12/21/10, MD  cetirizine (ZYRTEC) 10 MG tablet Take 10 mg by mouth at bedtime.    Historical Provider, MD  Coconut Oil 1000 MG CAPS Take 1,000 mg by mouth 2 (two) times daily.    Historical Provider, MD  colchicine 0.6 MG tablet Take 1 tablet (0.6 mg total) by mouth 2 (two) times daily. 12/19/10   12/21/10, MD  Cyanocobalamin (VITAMIN B-12 CR) 1000 MCG TBCR Take 1,000 mcg by mouth daily.     Historical Provider, MD  cyclobenzaprine (FLEXERIL) 5 MG tablet Take 5 mg by mouth 2 (two) times daily.     Historical Provider, MD  diazepam (VALIUM) 5 MG tablet Take 1 tablet (5 mg total) by mouth every 6 (six) hours as needed for muscle spasms. 01/04/13   01/06/13, MD  diazepam (VALIUM) 5 MG tablet Take 1 tablet (5 mg total) by mouth every 6 (six) hours as needed for muscle spasms. 08/24/13   08/26/13, MD  folic acid (FOLVITE) 1 MG tablet Take 2 mg by mouth daily.  05/15/13 05/15/14   Historical Provider, MD  HYDROcodone-acetaminophen (NORCO) 10-325 MG per tablet Take 1-2 tablets by mouth every 4 (four) hours as needed for moderate pain. 01/04/13   01/06/13, MD  lansoprazole (PREVACID) 15 MG capsule Take 15 mg by mouth at bedtime.     Historical Provider, MD  methotrexate 25 MG/ML injection Inject into the skin. 05/15/13 01/29/15  Historical Provider, MD  Methotrexate Sodium, PF, 50 MG/2ML SOLN Inject 0.8 mLs into the skin once a week. On Wednesday.    Historical Provider, MD  Multiple Vitamin (  MULTIVITAMIN WITH MINERALS) TABS Take 1 tablet by mouth daily.    Historical Provider, MD  Polyvinyl Alcohol-Povidone (REFRESH OP) Place 1 drop into both eyes 2 (two) times daily as needed (dry eyes).    Historical Provider, MD  predniSONE (DELTASONE) 5 MG tablet Take 5 mg by mouth daily with breakfast.    Historical Provider, MD  Risedronate Sodium (ATELVIA) 35 MG TBEC Take 35 mg by mouth once a week. Taken on Wednesdays.    Historical Provider, MD   BP 124/75  Pulse 116  Temp(Src) 98.4 F (36.9 C) (Oral)  Resp 17  Ht 5\' 5"  (1.651 m)  SpO2 98%  12:33: Orthostatic Vital Signs Orthostatic Lying - BP- Lying: 110/65 mmHg ; Pulse- Lying: 89  Orthostatic Sitting - BP- Sitting: 117/74 mmHg ; Pulse- Sitting: 92  Orthostatic Standing at 0 minutes - BP- Standing at 0 minutes: 118/85 mmHg ; Pulse- Standing at 0 minutes: 103   Physical Exam 1120: Physical examination:  Nursing notes reviewed; Vital signs and O2 SAT reviewed;  Constitutional: Well developed, Well nourished, Well hydrated, In no acute distress; Head:  Normocephalic, atraumatic; Eyes: EOMI, PERRL, No scleral icterus; ENMT: Mouth and pharynx normal, Mucous membranes moist; Neck: Supple, Full range of motion, No lymphadenopathy; Cardiovascular: Tachycardic rate and rhythm, No gallop; Respiratory: Breath sounds clear & equal bilaterally, No wheezes.  Speaking full sentences with ease, Normal respiratory effort/excursion; Chest:  Nontender, Movement normal; Abdomen: Soft, Nontender, Nondistended, Normal bowel sounds; Genitourinary: No CVA tenderness; Extremities: Pulses normal, No tenderness, No edema, No calf edema or asymmetry.; Neuro: AA&Ox3, Major CN grossly intact.  Speech clear. No gross focal motor or sensory deficits in extremities.; Skin: Color normal, Warm, Dry.   ED Course  Procedures     EKG Interpretation   Date/Time:  Saturday September 16 2013 10:55:44 EDT Ventricular Rate:  110 PR Interval:  132 QRS Duration: 72 QT Interval:  340 QTC Calculation: 460 R Axis:   67 Text Interpretation:  Sinus tachycardia Low voltage QRS Nonspecific T wave  abnormality Abnormal ECG When compared with ECG of 06/11/2012 Nonspecific T  wave abnormality is now Present Rate faster Confirmed by [facility]  MD,  [facility] [phone]) on 09/16/2013 11:35:21 AM      MDM  MDM Reviewed: previous chart, nursing note and vitals Reviewed previous: labs and ECG Interpretation: labs, ECG and x-ray   Results for orders placed during the hospital encounter of 09/16/13  TROPONIN I      Result Value Ref Range   Troponin I <0.30  <0.30 ng/mL  PRO B NATRIURETIC PEPTIDE      Result Value Ref Range   Pro B Natriuretic peptide (BNP) 6.6  0 - 125 pg/mL  BASIC METABOLIC PANEL      Result Value Ref Range   Sodium 144  137 - 147 mEq/L   Potassium 3.7  3.7 - 5.3 mEq/L   Chloride 104  96 - 112 mEq/L   CO2 28  19 - 32 mEq/L   Glucose, Bld 95  70 - 99 mg/dL   BUN 14  6 - 23 mg/dL   Creatinine, Ser 11/16/13  0.50 - 1.10 mg/dL   Calcium 9.5  8.4 - 9.50 mg/dL   GFR calc non Af Amer >90  >90 mL/min   GFR calc Af Amer >90  >90 mL/min   Anion gap 12  5 - 15  CBC      Result Value Ref Range   WBC 5.5  4.0 - 10.5 K/uL  RBC 4.13  3.87 - 5.11 MIL/uL   Hemoglobin 12.7  12.0 - 15.0 g/dL   HCT 00.3  49.1 - 79.1 %   MCV 89.3  78.0 - 100.0 fL   MCH 30.8  26.0 - 34.0 pg   MCHC 34.4  30.0 - 36.0 g/dL   RDW 50.5  69.7 - 94.8 %   Platelets 220  150  - 400 K/uL  D-DIMER, QUANTITATIVE      Result Value Ref Range   D-Dimer, Quant 0.30  0.00 - 0.48 ug/mL-FEU   Dg Chest 2 View 09/16/2013   CLINICAL DATA:  Sternal chest pain radiating to the left scapula and left arm. Near syncope.  EXAM: CHEST  2 VIEW  COMPARISON:  08/13/2010  FINDINGS: The heart size and mediastinal contours are within normal limits. Both lungs are clear. The visualized skeletal structures are unremarkable.  IMPRESSION: No active cardiopulmonary disease.   Electronically Signed   By: Geanie Cooley M.D.   On: 09/16/2013 12:23     1310: Pt is not orthostatic. Workup reassuring. T/C to Cards Dr. Mayford Knife, case discussed, including:  HPI, pertinent PM/SHx, VS/PE, dx testing, ED course and treatment:  Agreeable to consult, request they will come to the ED for evaluation, please admit to medicine service. T/C to [facility] Dr. Catha Gosselin, case discussed, including:  HPI, pertinent PM/SHx, VS/PE, dx testing, ED course and treatment:  Agreeable to admit, requests to write temporary orders, obtain observation tele bed to team [id number].      Samuel Jester, DO 09/20/13 [phone]

## 2013-09-16 NOTE — ED Notes (Signed)
Patient transported to CT 

## 2013-09-16 NOTE — Consult Note (Signed)
Reason for Consult: Syncope, chest pressure  Requesting Physician: Catha Gosselin  Cardiologist: Nahser  HPI: This is a 53 y.o. female with a past medical history significant for recent cervical spine surgery, Behcet's sd. On chronic steroids, previous lumbar spine surgery who presents with recurrent near syncope and one full syncopal event. Symptoms began several days post C spine surgery and are worsening. After walking a short distance, she becomes lightheaded, flushed, nauseous, has graying of vision and rapid strong palpitations. She also experiences left chest pressure radiating to her left arm. If she sits down, it gets better and if she lays down, the symptoms improve quicker. No prior cardiac history and no events of this type before surgery. Dose of steroids was recently manipulated for her surgery - had oral loading for "stress" of surgery for a couple of days.  PMHx:  Past Medical History  Diagnosis Date  . Endometriosis   . Endometrial polyp   . IBS (irritable bowel syndrome)   . Acid reflux   . Colitis   . Behcet's syndrome   . GERD (gastroesophageal reflux disease)   . Uveitis   . Osteopenia   . Adrenal insufficiency   . Subjective visual disturbance 07/14/2012  . Tear of lateral meniscus of right knee, current     Tear of lateral & medial meniscus of right knee  . Asthma   . PONV (postoperative nausea and vomiting)     also low blood pressure at times  . Pneumonia     20 years ago  . Refusal of blood transfusions as patient is Jehovah's Witness   . Chronic back pain   . Chronic neck pain    Past Surgical History  Procedure Laterality Date  . Cholecystectomy  12/2004  . Pelvic laparoscopy      DL  . Dilation and curettage of uterus    . Hysteroscopy    . Cesarean section  03/2001  . Knee surgery Left 05/1998  . Hand surgery    . Breast surgery      Cysts excised  . Oophorectomy      BSO  . Vaginal hysterectomy  07/2007    LAVH BSO  . Back surgery      . Knee arthroscopy with lateral menisectomy Right 12/12/2012    Procedure: KNEE ARTHROSCOPY WITH LATERAL MENISECTOMY;  Surgeon: Nilda Simmer, MD;  Location: [facility];  Service: Orthopedics;  Laterality: Right;  . Thyroid cyst excision    . Anterior fusion cervical spine  08/22/2013    c4 5  6         . Anterior cervical decomp/discectomy fusion N/A 08/22/2013    Procedure: ANTERIOR CERVICAL DECOMPRESSION/DISCECTOMY FUSION 2 LEVELS;  Surgeon: Karn Cassis, MD;  Location: [facility];  Service: Neurosurgery;  Laterality: N/A;  C4-5 C5-6 Anterior cervical decompression/diskectomy/fusion    FAMHx: Family History  Problem Relation Age of Onset  . Diabetes Mother   . Breast cancer Mother     Age 28's  . Stroke Mother   . Gastric cancer Father   . Diabetes Sister   . Hypertension Sister   . Breast cancer Sister     Age 17    SOCHx:  reports that she has never smoked. She has never used smokeless tobacco. She reports that she does not drink alcohol or use illicit drugs.  ALLERGIES: Allergies  Allergen Reactions  . Celebrex [Celecoxib] Other (See Comments)    Bleeding/bruising including bleeding for multiple  days  . Latex Shortness Of Breath and Dermatitis  . Sulfa Drugs Cross Reactors Other (See Comments)    Eyes turn red, renal failure, stomach problems (vomiting, diarrhea), vasculature collapses, migraine like symptoms  . Sulfites Other (See Comments)    Eyes turn red, renal failure, stomach problems (vomiting, diarrhea), vasculature collapses, migraine like symptoms  . Fentanyl Nausea And Vomiting  . Simponi [Golimumab] Swelling    Simponi Aria- Headache, blurred vision.  Marland Kitchen Epinephrine Other (See Comments)    Tachycardia  . Erythromycin Itching and Rash    Has tolerated azithromycin  . Morphine And Related Itching and Rash    Includes codeine; can take hydrocodone    ROS: The patient specifically denies orthopnea, paroxysmal nocturnal dyspnea, other  focal neurological deficits, intermittent claudication, lower extremity edema, unexplained weight gain, cough, hemoptysis or wheezing.  The patient also denies abdominal pain, vomiting, dysphagia, diarrhea, constipation, polyuria, polydipsia, dysuria, hematuria, frequency, urgency, abnormal bleeding or bruising, fever, chills, unexpected weight changes, mood swings, change in skin or hair texture, change in voice quality, auditory or visual problems, allergic reactions or rashes, new musculoskeletal complaints other than usual "aches and pains".   HOME MEDICATIONS: reviewed  VITALS: Blood pressure 118/85, pulse 99, temperature 98.4 F (36.9 C), temperature source Oral, resp. rate 18, height 5\' 5"  (1.651 m), SpO2 99.00%.  PHYSICAL EXAM:  General: Alert, oriented x3, no distress, soft C collar Head: no evidence of trauma, PERRL, EOMI, no exophtalmos or lid lag, no myxedema, no xanthelasma; normal ears, nose and oropharynx Neck: flat jugular venous pulsations and no hepatojugular reflux; brisk carotid pulses without delay and no carotid bruits Chest: clear to auscultation, no signs of consolidation by percussion or palpation, normal fremitus, symmetrical and full respiratory excursions Cardiovascular: normal position and quality of the apical impulse, regular rhythm, normal first heart sound and normal second heart sound, no rubs or gallops, no murmur Abdomen: no tenderness or distention, no masses by palpation, no abnormal pulsatility or arterial bruits, normal bowel sounds, no hepatosplenomegaly Extremities: no clubbing, cyanosis;  no edema; 2+ radial, ulnar and brachial pulses bilaterally; 2+ right femoral, posterior tibial and dorsalis pedis pulses; 2+ left femoral, posterior tibial and dorsalis pedis pulses; no subclavian or femoral bruits Neurological: grossly nonfocal   LABS  CBC  Recent Labs  09/16/13 1128  WBC 5.5  HGB 12.7  HCT 36.9  MCV 89.3  PLT 220   Basic Metabolic  Panel  Recent Labs  11/16/13 1128  NA 144  K 3.7  CL 104  CO2 28  GLUCOSE 95  BUN 14  CREATININE 0.51  CALCIUM 9.5   Liver Function Tests No results found for this basename: AST, ALT, ALKPHOS, BILITOT, PROT, ALBUMIN,  in the last 72 hours No results found for this basename: LIPASE, AMYLASE,  in the last 72 hours Cardiac Enzymes  Recent Labs  09/16/13 1128  TROPONINI <0.30   BNP No components found with this basename: POCBNP,  D-Dimer  Recent Labs  09/16/13 1128  DDIMER 0.30    IMAGING: Dg Chest 2 View  09/16/2013   CLINICAL DATA:  Sternal chest pain radiating to the left scapula and left arm. Near syncope.  EXAM: CHEST  2 VIEW  COMPARISON:  08/13/2010  FINDINGS: The heart size and mediastinal contours are within normal limits. Both lungs are clear. The visualized skeletal structures are unremarkable.  IMPRESSION: No active cardiopulmonary disease.   Electronically Signed   By: 10/13/2010 M.D.   On: 09/16/2013 12:23  ECG: NSR  TELEMETRY: NSR, minor NSTT changes  IMPRESSION: 1. SYNCOPE/NEAR-SYNCOPE strongly suggestive of hypotension with prolonged orthostasis 2. Consider iatrogenic adrenal insufficiency due to long term steroid use, exacerbated post op 3. Atypical chest pain, strong FHx of early CAD related death  RECOMMENDATION: 1. AM cortisol level, then start fludrocortisone 2. IV NS today for total of 2-3 liters 3. Echo 4. Lexiscan Myoview - hopefully can do in AM, if not Monday 5. Tilt table test if symptoms do not improve with fludrocortisone. 6. Telemetry - has an outpatient monitor already ordered if necessary  Time Spent Directly with Patient: 55 minutes  Thurmon Fair, MD, [facility] [phone] office [phone] pager   09/16/2013, 1:57 PM

## 2013-09-16 NOTE — ED Notes (Signed)
NM called to advise test unable to be done today.  Will be done first thing in am.

## 2013-09-17 ENCOUNTER — Observation Stay (HOSPITAL_COMMUNITY): Payer: 59

## 2013-09-17 DIAGNOSIS — R079 Chest pain, unspecified: Secondary | ICD-10-CM

## 2013-09-17 DIAGNOSIS — M352 Behcet's disease: Secondary | ICD-10-CM

## 2013-09-17 DIAGNOSIS — I369 Nonrheumatic tricuspid valve disorder, unspecified: Secondary | ICD-10-CM

## 2013-09-17 DIAGNOSIS — R002 Palpitations: Secondary | ICD-10-CM

## 2013-09-17 DIAGNOSIS — R55 Syncope and collapse: Secondary | ICD-10-CM

## 2013-09-17 LAB — TROPONIN I: Troponin I: <0.3 ng/mL (ref <0.30)

## 2013-09-17 MED ORDER — TECHNETIUM TC 99M SESTAMIBI - CARDIOLITE
10.0000 | Freq: Once | INTRAVENOUS | Status: AC | PRN
  Administered 2013-09-17: 10 via INTRAVENOUS

## 2013-09-17 MED ORDER — REGADENOSON 0.4 MG/5ML IV SOLN
INTRAVENOUS | Status: AC
  Administered 2013-09-17: 0.4 mg via INTRAVENOUS
  Filled 2013-09-17: qty 5

## 2013-09-17 MED ORDER — TECHNETIUM TC 99M SESTAMIBI - CARDIOLITE
30.0000 | Freq: Once | INTRAVENOUS | Status: AC | PRN
  Administered 2013-09-17: 30 via INTRAVENOUS

## 2013-09-17 MED ORDER — REGADENOSON 0.4 MG/5ML IV SOLN
0.4000 mg | Freq: Once | INTRAVENOUS | Status: AC
  Administered 2013-09-17: 0.4 mg via INTRAVENOUS
  Filled 2013-09-17: qty 5

## 2013-09-17 MED ORDER — SIMVASTATIN 20 MG PO TABS
20.0000 mg | ORAL_TABLET | Freq: Every day | ORAL | Status: DC
  Administered 2013-09-17 – 2013-09-19 (×3): 20 mg via ORAL
  Filled 2013-09-17 (×3): qty 1

## 2013-09-17 MED ORDER — SODIUM CHLORIDE 0.9 % IV BOLUS (SEPSIS)
250.0000 mL | INTRAVENOUS | Status: AC
  Administered 2013-09-17: 250 mL via INTRAVENOUS

## 2013-09-17 MED ORDER — GADOBENATE DIMEGLUMINE 529 MG/ML IV SOLN
15.0000 mL | Freq: Once | INTRAVENOUS | Status: AC | PRN
  Administered 2013-09-17: 15 mL via INTRAVENOUS

## 2013-09-17 MED ORDER — ASPIRIN EC 325 MG PO TBEC
325.0000 mg | DELAYED_RELEASE_TABLET | Freq: Every day | ORAL | Status: DC
  Administered 2013-09-17 – 2013-09-19 (×3): 325 mg via ORAL
  Filled 2013-09-17 (×3): qty 1

## 2013-09-17 NOTE — Progress Notes (Addendum)
Lexiscan CL performed.   Pt had flushed and hot feeling after test, BP still a little low and HR still > 100. Will give fluid bolus.  BP slowly improved. Pt requested Gatorade, not available so diluted some saline and she did well with this.

## 2013-09-17 NOTE — Progress Notes (Addendum)
SUBJECTIVE:  C/O some mild chest pressure earlier this am  OBJECTIVE:   Vitals:   Filed Vitals:   09/16/13 1400 09/16/13 1456 09/16/13 2017 09/17/13 0431  BP: 120/74 136/88 111/67 101/62  Pulse: 87 98 80 79  Temp:  97.8 F (36.6 C) 98.3 F (36.8 C) 98.2 F (36.8 C)  TempSrc:  Oral Oral Oral  Resp: 16 18 17 19   Height:      SpO2: 100% 100% 100% 100%   I&O's:   Intake/Output Summary (Last 24 hours) at 09/17/13 0810 Last data filed at 09/17/13 0423  Gross per 24 hour  Intake   1190 ml  Output   1250 ml  Net    -60 ml   TELEMETRY: Reviewed telemetry pt in NSR:     PHYSICAL EXAM General: Well developed, well nourished, in no acute distress Head: Eyes PERRLA, No xanthomas.   Normal cephalic and atramatic  Lungs:   Clear bilaterally to auscultation and percussion. Heart:   HRRR S1 S2 Pulses are 2+ & equal. Abdomen: Bowel sounds are positive, abdomen soft and non-tender without masses Extremities:   No clubbing, cyanosis or edema.  DP +1 Neuro: Alert and oriented X 3. Psych:  Good affect, responds appropriately   LABS: Basic Metabolic Panel:  Recent Labs  09/19/13 1128 09/16/13 1545  NA 144  --   K 3.7  --   CL 104  --   CO2 28  --   GLUCOSE 95  --   BUN 14  --   CREATININE 0.51 0.52  CALCIUM 9.5  --   MG  --  1.8  PHOS  --  3.7   Liver Function Tests: No results found for this basename: AST, ALT, ALKPHOS, BILITOT, PROT, ALBUMIN,  in the last 72 hours No results found for this basename: LIPASE, AMYLASE,  in the last 72 hours CBC:  Recent Labs  09/16/13 1128 09/16/13 1545  WBC 5.5 5.4  HGB 12.7 12.1  HCT 36.9 35.7*  MCV 89.3 88.6  PLT 220 195   Cardiac Enzymes:  Recent Labs  09/16/13 1542 09/16/13 2119 09/17/13 0403  TROPONINI <0.30 <0.30 <0.30   BNP: No components found with this basename: POCBNP,  D-Dimer:  Recent Labs  09/16/13 1128  DDIMER 0.30   Hemoglobin A1C: No results found for this basename: HGBA1C,  in the last 72  hours Fasting Lipid Panel: No results found for this basename: CHOL, HDL, LDLCALC, TRIG, CHOLHDL, LDLDIRECT,  in the last 72 hours Thyroid Function Tests: No results found for this basename: TSH, T4TOTAL, FREET3, T3FREE, THYROIDAB,  in the last 72 hours Anemia Panel: No results found for this basename: VITAMINB12, FOLATE, FERRITIN, TIBC, IRON, RETICCTPCT,  in the last 72 hours Coag Panel:   No results found for this basename: INR, PROTIME    RADIOLOGY: Dg Chest 2 View  09/16/2013   CLINICAL DATA:  Sternal chest pain radiating to the left scapula and left arm. Near syncope.  EXAM: CHEST  2 VIEW  COMPARISON:  08/13/2010  FINDINGS: The heart size and mediastinal contours are within normal limits. Both lungs are clear. The visualized skeletal structures are unremarkable.  IMPRESSION: No active cardiopulmonary disease.   Electronically Signed   By: 10/13/2010 M.D.   On: 09/16/2013 12:23   Dg Cervical Spine 2-3 Views  08/22/2013   CLINICAL DATA:  C4 through C6 fusion.  EXAM: OPERATIVE CERVICAL SPINE - 2-3 VIEW  COMPARISON:  MRI cervical spine 07/18/2013. Flexion and extension cervical  spine imaging 07/24/2013.  FINDINGS: Initial image obtained at 0942 hr and submitted for interpretation postoperatively demonstrates a localizer needle projected over the C4-5 disc space. 2nd image obtained at 1056 hr and submitted for interpretation postoperatively demonstrates ACDF with hardware from C4 through C6 with interbody fusion plugs at C4-5 and C5-6. Plugs are appropriately positioned anteriorly in the disc space. No acute complicating features.  IMPRESSION: ACDF with hardware C4 through C6 with interbody fusion plugs at C4-5 and C5-6 without acute complicating features.   Electronically Signed   By: Hulan Saas M.D.   On: 08/22/2013 11:16   IMPRESSION:  1. SYNCOPE/NEAR-SYNCOPE strongly suggestive of hypotension with prolonged orthostasis 2. Consider iatrogenic adrenal insufficiency due to long term  steroid use, exacerbated post op 3. Atypical chest pain, strong FHx of early CAD related death - cardiac enzymes neg x 3 and EKG with nonspecific T wave abnormality 4. Sinus tachycardia improved after IVF  RECOMMENDATION:  1. AM cortisol level, then start fludrocortisone 2. Echo today 3. Lexiscan Myoview - this am 4.  Tilt table test if symptoms do not improve with fludrocortisone. 5.  Telemetry - has an outpatient monitor already ordered if necessary   Quintella Reichert, MD  09/17/2013  8:10 AM

## 2013-09-17 NOTE — Progress Notes (Signed)
NS bolus ordered per PA for pt c/o light headedness and feeling faint.

## 2013-09-17 NOTE — Progress Notes (Signed)
PROGRESS NOTE  [name] MAU:[id number] DOB: February 22, 1960 DOA: 09/16/2013 PCP: Pearson Grippe, MD  Assessment/Plan: Palpitations/chest pain/syncope  -Cardiology has been consulted  -CE negative -echocardiogram  -lexiscan this AM -tilt test? outpatient -A.m. cortisol level pending -Continue fludrocortisone as well as prednisone, start on Sunday  -Continue IV fluid  -BNP as well d-dimer negative  - holter monitor arranged as outpatient  Behcet's  -Will hold methotrexate  -Continue colchicine and prednisone   GERD  -Continue PPI  Previously abnormal MRI of brain -needed repeating in January- will get MRI with/wo here    Code Status: full Family Communication: patient Disposition Plan:   Consultants:  cardiology  Procedures:    HPI/Subjective: Up walking from the bathroom No SOB -symptoms only happen when patient walks for a long time  Objective: Filed Vitals:   09/17/13 0431  BP: 101/62  Pulse: 79  Temp: 98.2 F (36.8 C)  Resp: 19    Intake/Output Summary (Last 24 hours) at 09/17/13 0824 Last data filed at 09/17/13 0423  Gross per 24 hour  Intake   1190 ml  Output   1250 ml  Net    -60 ml   There were no vitals filed for this visit.  Exam:   General:  A+Ox3, NAD  Cardiovascular: rrr  Respiratory: clear  Musculoskeletal: no edema  Data Reviewed: Basic Metabolic Panel:  Recent Labs Lab 09/16/13 1128 09/16/13 1545  NA 144  --   K 3.7  --   CL 104  --   CO2 28  --   GLUCOSE 95  --   BUN 14  --   CREATININE 0.51 0.52  CALCIUM 9.5  --   MG  --  1.8  PHOS  --  3.7   Liver Function Tests: No results found for this basename: AST, ALT, ALKPHOS, BILITOT, PROT, ALBUMIN,  in the last 168 hours No results found for this basename: LIPASE, AMYLASE,  in the last 168 hours No results found for this basename: AMMONIA,  in the last 168 hours CBC:  Recent Labs Lab 09/16/13 1128 09/16/13 1545  WBC 5.5 5.4  HGB 12.7 12.1  HCT 36.9  35.7*  MCV 89.3 88.6  PLT 220 195   Cardiac Enzymes:  Recent Labs Lab 09/16/13 1128 09/16/13 1542 09/16/13 2119 09/17/13 0403  TROPONINI <0.30 <0.30 <0.30 <0.30   BNP (last 3 results)  Recent Labs  09/16/13 1128  PROBNP 6.6   CBG: No results found for this basename: GLUCAP,  in the last 168 hours  No results found for this or any previous visit (from the past 240 hour(s)).   Studies: Dg Chest 2 View  09/16/2013   CLINICAL DATA:  Sternal chest pain radiating to the left scapula and left arm. Near syncope.  EXAM: CHEST  2 VIEW  COMPARISON:  08/13/2010  FINDINGS: The heart size and mediastinal contours are within normal limits. Both lungs are clear. The visualized skeletal structures are unremarkable.  IMPRESSION: No active cardiopulmonary disease.   Electronically Signed   By: Geanie Cooley M.D.   On: 09/16/2013 12:23    Scheduled Meds: . calcium carbonate  1 tablet Oral Daily  . cetirizine  10 mg Oral QHS  . cholecalciferol  1,000 Units Oral Daily  . colchicine  0.6 mg Oral BID  . cyclobenzaprine  5 mg Oral BID  . enoxaparin (LOVENOX) injection  40 mg Subcutaneous Q24H  . fludrocortisone  0.1 mg Oral Daily  . folic acid  2 mg Oral  Daily  . multivitamin with minerals  1 tablet Oral Daily  . pantoprazole  40 mg Oral Daily  . predniSONE  10 mg Oral Q breakfast  . vitamin B-12  1,000 mcg Oral Daily   Continuous Infusions: . sodium chloride 100 mL/hr at 09/16/13 2350   Antibiotics Given (last 72 hours)   None      Active Problems:   Palpitations    Time spent: 35 min    Theoren Palka  [facility] Pager [phone]. If 7PM-7AM, please contact night-coverage at [email or web address], password [facility] 09/17/2013, 8:24 AM  LOS: 1 day

## 2013-09-17 NOTE — Progress Notes (Signed)
Utilization review completed.  

## 2013-09-17 NOTE — Progress Notes (Signed)
  Echocardiogram 2D Echocardiogram has been performed.  Janalyn Harder 09/17/2013, 4:04 PM

## 2013-09-18 ENCOUNTER — Encounter: Payer: Self-pay | Admitting: *Deleted

## 2013-09-18 ENCOUNTER — Encounter (HOSPITAL_COMMUNITY)
Admission: EM | Disposition: A | Discharge status: Home / Self Care | Payer: Self-pay | Source: Home / Self Care | Attending: Internal Medicine

## 2013-09-18 ENCOUNTER — Ambulatory Visit: Payer: 59 | Admitting: Cardiovascular Disease

## 2013-09-18 ENCOUNTER — Encounter (HOSPITAL_COMMUNITY): Payer: Self-pay | Admitting: General Practice

## 2013-09-18 DIAGNOSIS — Z981 Arthrodesis status: Secondary | ICD-10-CM | POA: Diagnosis not present

## 2013-09-18 DIAGNOSIS — IMO0002 Reserved for concepts with insufficient information to code with codable children: Secondary | ICD-10-CM | POA: Diagnosis not present

## 2013-09-18 DIAGNOSIS — Z79899 Other long term (current) drug therapy: Secondary | ICD-10-CM | POA: Diagnosis not present

## 2013-09-18 DIAGNOSIS — I498 Other specified cardiac arrhythmias: Secondary | ICD-10-CM | POA: Diagnosis present

## 2013-09-18 DIAGNOSIS — E2749 Other adrenocortical insufficiency: Secondary | ICD-10-CM | POA: Diagnosis present

## 2013-09-18 DIAGNOSIS — M949 Disorder of cartilage, unspecified: Secondary | ICD-10-CM | POA: Diagnosis present

## 2013-09-18 DIAGNOSIS — R079 Chest pain, unspecified: Secondary | ICD-10-CM

## 2013-09-18 DIAGNOSIS — R0789 Other chest pain: Secondary | ICD-10-CM | POA: Diagnosis present

## 2013-09-18 DIAGNOSIS — M899 Disorder of bone, unspecified: Secondary | ICD-10-CM | POA: Diagnosis present

## 2013-09-18 DIAGNOSIS — Z8249 Family history of ischemic heart disease and other diseases of the circulatory system: Secondary | ICD-10-CM | POA: Diagnosis not present

## 2013-09-18 DIAGNOSIS — I951 Orthostatic hypotension: Secondary | ICD-10-CM | POA: Diagnosis present

## 2013-09-18 DIAGNOSIS — M352 Behcet's disease: Secondary | ICD-10-CM | POA: Diagnosis present

## 2013-09-18 DIAGNOSIS — I4729 Other ventricular tachycardia: Secondary | ICD-10-CM | POA: Diagnosis not present

## 2013-09-18 DIAGNOSIS — K219 Gastro-esophageal reflux disease without esophagitis: Secondary | ICD-10-CM | POA: Diagnosis present

## 2013-09-18 DIAGNOSIS — I472 Ventricular tachycardia: Secondary | ICD-10-CM | POA: Diagnosis not present

## 2013-09-18 HISTORY — PX: LEFT HEART CATHETERIZATION WITH CORONARY ANGIOGRAM: SHX5451

## 2013-09-18 HISTORY — PX: CARDIAC CATHETERIZATION: SHX172

## 2013-09-18 LAB — LIPID PANEL
Cholesterol: 175 mg/dL (ref 0–200)
HDL: 52 mg/dL (ref >39)
LDL Cholesterol: 108 mg/dL — ABNORMAL HIGH (ref 0–99)
Total CHOL/HDL Ratio: 3.4 RATIO
Triglycerides: 77 mg/dL (ref <150)
VLDL: 15 mg/dL (ref 0–40)

## 2013-09-18 LAB — PROTIME-INR
INR: 0.95 (ref 0.00–1.49)
Prothrombin Time: 12.7 seconds (ref 11.6–15.2)

## 2013-09-18 LAB — CORTISOL: Cortisol, Plasma: 0.5 ug/dL

## 2013-09-18 SURGERY — LEFT HEART CATHETERIZATION WITH CORONARY ANGIOGRAM
Anesthesia: LOCAL

## 2013-09-18 MED ORDER — SODIUM CHLORIDE 0.9 % IV SOLN
INTRAVENOUS | Status: DC
  Administered 2013-09-19: 02:00:00 via INTRAVENOUS

## 2013-09-18 MED ORDER — VERAPAMIL HCL 2.5 MG/ML IV SOLN
INTRAVENOUS | Status: AC
  Filled 2013-09-18: qty 2

## 2013-09-18 MED ORDER — SODIUM CHLORIDE 0.9 % IV SOLN
INTRAVENOUS | Status: AC
  Administered 2013-09-18: 17:00:00 via INTRAVENOUS

## 2013-09-18 MED ORDER — MIDAZOLAM HCL 2 MG/2ML IJ SOLN
INTRAMUSCULAR | Status: AC
  Filled 2013-09-18: qty 2

## 2013-09-18 MED ORDER — LIDOCAINE HCL (PF) 1 % IJ SOLN
INTRAMUSCULAR | Status: AC
  Filled 2013-09-18: qty 30

## 2013-09-18 MED ORDER — INFLUENZA VAC SPLIT QUAD 0.5 ML IM SUSY
0.5000 mL | PREFILLED_SYRINGE | INTRAMUSCULAR | Status: AC
  Administered 2013-09-19: 0.5 mL via INTRAMUSCULAR
  Filled 2013-09-18: qty 0.5

## 2013-09-18 MED ORDER — SODIUM CHLORIDE 0.9 % IJ SOLN
3.0000 mL | Freq: Two times a day (BID) | INTRAMUSCULAR | Status: DC
  Administered 2013-09-18 – 2013-09-19 (×2): 3 mL via INTRAVENOUS

## 2013-09-18 MED ORDER — HEPARIN (PORCINE) IN NACL 2-0.9 UNIT/ML-% IJ SOLN
INTRAMUSCULAR | Status: AC
  Filled 2013-09-18: qty 1000

## 2013-09-18 MED ORDER — SODIUM CHLORIDE 0.9 % IJ SOLN: 3.0000 mL | INTRAMUSCULAR | Status: DC | PRN

## 2013-09-18 MED ORDER — NITROGLYCERIN 1 MG/10 ML FOR IR/CATH LAB
INTRA_ARTERIAL | Status: AC
  Filled 2013-09-18: qty 10

## 2013-09-18 MED ORDER — SODIUM CHLORIDE 0.9 % IV SOLN: 250.0000 mL | INTRAVENOUS | Status: DC | PRN

## 2013-09-18 MED FILL — Cetirizine HCl Tab 10 MG: ORAL | Qty: 1 | Status: AC

## 2013-09-18 NOTE — H&P (View-Only) (Signed)
Subjective: Continues to have CP "cramp" with exertion  Objective: Vital signs in last 24 hours: Temp:  [98.1 F (36.7 C)-98.5 F (36.9 C)] 98.5 F (36.9 C) (09/13 2035) Pulse Rate:  [94-104] 104 (09/13 2035) Resp:  [17-18] 18 (09/13 2035) BP: (105-138)/(53-77) 119/70 mmHg (09/13 2035) SpO2:  [100 %] 100 % (09/13 2035) Last BM Date: 09/16/13  Intake/Output from previous day: 09/13 0701 - 09/14 0700 In: 500 [P.O.:500] Out: 800 [Urine:800] Intake/Output this shift:    Medications Current Facility-Administered Medications  Medication Dose Route Frequency Provider Last Rate Last Dose  . 0.9 %  sodium chloride infusion   Intravenous Continuous Samuel Jester, DO 100 mL/hr at 09/17/13 1020    . acetaminophen (TYLENOL) tablet 650 mg  650 mg Oral Q8H PRN Maryann Mikhail, DO   650 mg at 09/17/13 1326  . aspirin EC tablet 325 mg  325 mg Oral Daily Joseph Art, DO   325 mg at 09/17/13 2000  . calcium carbonate (TUMS - dosed in mg elemental calcium) chewable tablet 200 mg of elemental calcium  1 tablet Oral Daily Maryann Mikhail, DO   200 mg of elemental calcium at 09/17/13 1328  . cetirizine (ZYRTEC) tablet 10 mg  10 mg Oral QHS Maryann Mikhail, DO   10 mg at 09/17/13 2124  . cholecalciferol (VITAMIN D) tablet 1,000 Units  1,000 Units Oral Daily Maryann Mikhail, DO   1,000 Units at 09/17/13 1329  . colchicine tablet 0.6 mg  0.6 mg Oral BID Maryann Mikhail, DO   0.6 mg at 09/17/13 2124  . cyclobenzaprine (FLEXERIL) tablet 5 mg  5 mg Oral BID Maryann Mikhail, DO   5 mg at 09/17/13 2124  . diazepam (VALIUM) tablet 5 mg  5 mg Oral Q6H PRN Maryann Mikhail, DO   5 mg at 09/17/13 2124  . enoxaparin (LOVENOX) injection 40 mg  40 mg Subcutaneous Q24H Maryann Mikhail, DO   40 mg at 09/17/13 1600  . fludrocortisone (FLORINEF) tablet 0.1 mg  0.1 mg Oral Daily Mihai Croitoru, MD   0.1 mg at 09/17/13 1330  . folic acid (FOLVITE) tablet 2 mg  2 mg Oral Daily Maryann Mikhail, DO   2 mg at  09/17/13 1329  . HYDROcodone-acetaminophen (NORCO/VICODIN) 5-325 MG per tablet 1 tablet  1 tablet Oral Q6H PRN Maryann Mikhail, DO      . multivitamin with minerals tablet 1 tablet  1 tablet Oral Daily Maryann Mikhail, DO   1 tablet at 09/17/13 1329  . ondansetron (ZOFRAN) injection 4 mg  4 mg Intravenous Q6H PRN Maryann Mikhail, DO      . pantoprazole (PROTONIX) EC tablet 40 mg  40 mg Oral Daily Maryann Mikhail, DO   40 mg at 09/17/13 1326  . predniSONE (DELTASONE) tablet 10 mg  10 mg Oral Q breakfast Maryann Mikhail, DO   10 mg at 09/18/13 4401  . simvastatin (ZOCOR) tablet 20 mg  20 mg Oral q1800 Joseph Art, DO   20 mg at 09/17/13 2000  . vitamin B-12 (CYANOCOBALAMIN) tablet 1,000 mcg  1,000 mcg Oral Daily Maryann Mikhail, DO   1,000 mcg at 09/17/13 1328    PE: General appearance: alert, cooperative and no distress Lungs: clear to auscultation bilaterally Heart: regular rate and rhythm, S1, S2 normal, no murmur, click, rub or gallop Extremities: No LEE Pulses: 2+ and symmetric Skin: Warm and dry Neurologic: Grossly normal  Lab Results:   Recent Labs  09/16/13 1128 09/16/13 1545  WBC 5.5  5.4  HGB 12.7 12.1  HCT 36.9 35.7*  PLT 220 195   BMET  Recent Labs  09/16/13 1128 09/16/13 1545  NA 144  --   K 3.7  --   CL 104  --   CO2 28  --   GLUCOSE 95  --   BUN 14  --   CREATININE 0.51 0.52  CALCIUM 9.5  --      Assessment/Plan   Active Problems:   Behcet's disease   Palpitations   NSVT   Presyncope   Syncope     Chest Pain   Orthostatic hypotension  Plan: Stress test results: Small area of mild reversibility is noted involving the mid segments anterior and inferior septal wall. Echo normal.  EF 60-65%.  3 beat NSVT on tele. BP stable.   SP 2-3 liters NS.   Mildly tachy.  Check lipids.  On statin.  If her BP remains stable should add low dose BB.   Left heart cath today.   NPO now.     LOS: 2 days    HAGER, BRYAN PA-C 09/18/2013 7:45 AM  Personally  seen and examined. Agree with above.  53 year old female with autoimmune condition, Behcet's disease, with intermittent chest discomfort, left arm pain, postopcervical spine surgery on 08/22/13.   1. Chest pain-certainly has atypical qualities however with her left arm pain associated with an ongoing discomfort, and nuclear stress test was performed which showed a reversible inferior wall defect. Given this finding, cardiac catheterization will take place. I discussed with her the risks and benefits of procedure including stroke, heart attack, death, bleeding, renal impairment. She is willing to proceed. If cardiac catheterization is reassuring, potential discharge later this afternoon.  Ejection fraction is normal on echocardiogram.  2. Syncope/near-syncope-strong suggestion of hypotension with orthostasis. Dr. Mayford Knife started fludrocortisone 0.1 mg. Continue to monitor basic metabolic profile as outpatient.  3. Early family history of coronary artery disease. EKG nonspecific T-wave changes  4. Sinus tachycardia improved with IV fluids previously. Donato Schultz, MD

## 2013-09-18 NOTE — Progress Notes (Signed)
  Subjective: Continues to have CP "cramp" with exertion  Objective: Vital signs in last 24 hours: Temp:  [98.1 F (36.7 C)-98.5 F (36.9 C)] 98.5 F (36.9 C) (09/13 2035) Pulse Rate:  [94-104] 104 (09/13 2035) Resp:  [17-18] 18 (09/13 2035) BP: (105-138)/(53-77) 119/70 mmHg (09/13 2035) SpO2:  [100 %] 100 % (09/13 2035) Last BM Date: 09/16/13  Intake/Output from previous day: 09/13 0701 - 09/14 0700 In: 500 [P.O.:500] Out: 800 [Urine:800] Intake/Output this shift:    Medications Current Facility-Administered Medications  Medication Dose Route Frequency Provider Last Rate Last Dose  . 0.9 %  sodium chloride infusion   Intravenous Continuous Kathleen McManus, DO 100 mL/hr at 09/17/13 1020    . acetaminophen (TYLENOL) tablet 650 mg  650 mg Oral Q8H PRN Maryann Mikhail, DO   650 mg at 09/17/13 1326  . aspirin EC tablet 325 mg  325 mg Oral Daily Jessica U Vann, DO   325 mg at 09/17/13 2000  . calcium carbonate (TUMS - dosed in mg elemental calcium) chewable tablet 200 mg of elemental calcium  1 tablet Oral Daily Maryann Mikhail, DO   200 mg of elemental calcium at 09/17/13 1328  . cetirizine (ZYRTEC) tablet 10 mg  10 mg Oral QHS Maryann Mikhail, DO   10 mg at 09/17/13 2124  . cholecalciferol (VITAMIN D) tablet 1,000 Units  1,000 Units Oral Daily Maryann Mikhail, DO   1,000 Units at 09/17/13 1329  . colchicine tablet 0.6 mg  0.6 mg Oral BID Maryann Mikhail, DO   0.6 mg at 09/17/13 2124  . cyclobenzaprine (FLEXERIL) tablet 5 mg  5 mg Oral BID Maryann Mikhail, DO   5 mg at 09/17/13 2124  . diazepam (VALIUM) tablet 5 mg  5 mg Oral Q6H PRN Maryann Mikhail, DO   5 mg at 09/17/13 2124  . enoxaparin (LOVENOX) injection 40 mg  40 mg Subcutaneous Q24H Maryann Mikhail, DO   40 mg at 09/17/13 1600  . fludrocortisone (FLORINEF) tablet 0.1 mg  0.1 mg Oral Daily Mihai Croitoru, MD   0.1 mg at 09/17/13 1330  . folic acid (FOLVITE) tablet 2 mg  2 mg Oral Daily Maryann Mikhail, DO   2 mg at  09/17/13 1329  . HYDROcodone-acetaminophen (NORCO/VICODIN) 5-325 MG per tablet 1 tablet  1 tablet Oral Q6H PRN Maryann Mikhail, DO      . multivitamin with minerals tablet 1 tablet  1 tablet Oral Daily Maryann Mikhail, DO   1 tablet at 09/17/13 1329  . ondansetron (ZOFRAN) injection 4 mg  4 mg Intravenous Q6H PRN Maryann Mikhail, DO      . pantoprazole (PROTONIX) EC tablet 40 mg  40 mg Oral Daily Maryann Mikhail, DO   40 mg at 09/17/13 1326  . predniSONE (DELTASONE) tablet 10 mg  10 mg Oral Q breakfast Maryann Mikhail, DO   10 mg at 09/18/13 0552  . simvastatin (ZOCOR) tablet 20 mg  20 mg Oral q1800 Jessica U Vann, DO   20 mg at 09/17/13 2000  . vitamin B-12 (CYANOCOBALAMIN) tablet 1,000 mcg  1,000 mcg Oral Daily Maryann Mikhail, DO   1,000 mcg at 09/17/13 1328    PE: General appearance: alert, cooperative and no distress Lungs: clear to auscultation bilaterally Heart: regular rate and rhythm, S1, S2 normal, no murmur, click, rub or gallop Extremities: No LEE Pulses: 2+ and symmetric Skin: Warm and dry Neurologic: Grossly normal  Lab Results:   Recent Labs  09/16/13 1128 09/16/13 1545  WBC 5.5   5.4  HGB 12.7 12.1  HCT 36.9 35.7*  PLT 220 195   BMET  Recent Labs  09/16/13 1128 09/16/13 1545  NA 144  --   K 3.7  --   CL 104  --   CO2 28  --   GLUCOSE 95  --   BUN 14  --   CREATININE 0.51 0.52  CALCIUM 9.5  --      Assessment/Plan   Active Problems:   Behcet's disease   Palpitations   NSVT   Presyncope   Syncope     Chest Pain   Orthostatic hypotension  Plan: Stress test results: Small area of mild reversibility is noted involving the mid segments anterior and inferior septal wall. Echo normal.  EF 60-65%.  3 beat NSVT on tele. BP stable.   SP 2-3 liters NS.   Mildly tachy.  Check lipids.  On statin.  If her BP remains stable should add low dose BB.   Left heart cath today.   NPO now.     LOS: 2 days    HAGER, BRYAN PA-C 09/18/2013 7:45 AM  Personally  seen and examined. Agree with above.  53 year old female with autoimmune condition, Behcet's disease, with intermittent chest discomfort, left arm pain, postopcervical spine surgery on 08/22/13.   1. Chest pain-certainly has atypical qualities however with her left arm pain associated with an ongoing discomfort, and nuclear stress test was performed which showed a reversible inferior wall defect. Given this finding, cardiac catheterization will take place. I discussed with her the risks and benefits of procedure including stroke, heart attack, death, bleeding, renal impairment. She is willing to proceed. If cardiac catheterization is reassuring, potential discharge later this afternoon.  Ejection fraction is normal on echocardiogram.  2. Syncope/near-syncope-strong suggestion of hypotension with orthostasis. Dr. Mayford Knife started fludrocortisone 0.1 mg. Continue to monitor basic metabolic profile as outpatient.  3. Early family history of coronary artery disease. EKG nonspecific T-wave changes  4. Sinus tachycardia improved with IV fluids previously. Donato Schultz, MD

## 2013-09-18 NOTE — Care Management Note (Unsigned)
    Page 1 of 1   09/18/2013     3:56:08 PM CARE MANAGEMENT NOTE 09/18/2013  Patient:  [name], [name]   Account Number:  [id number]  Date Initiated:  09/18/2013  Documentation initiated by:  Joden Bonsall  Subjective/Objective Assessment:   Pt adm on 09/16/13 with chest pain.  PTA, pt independent, lives with spouse.     Action/Plan:   Will follow for dc needs as pt progresses.   Anticipated DC Date:  09/19/2013   Anticipated DC Plan:  HOME/SELF CARE      DC Planning Services  CM consult      Choice offered to / List presented to:             Status of service:  In process, will continue to follow Medicare Important Message given?   (If response is "NO", the following Medicare IM given date fields will be blank) Date Medicare IM given:   Medicare IM given by:   Date Additional Medicare IM given:   Additional Medicare IM given by:    Discharge Disposition:    Per UR Regulation:  Reviewed for med. necessity/level of care/duration of stay  If discussed at Long Length of Stay Meetings, dates discussed:    Comments:

## 2013-09-18 NOTE — Progress Notes (Signed)
Utilization review completed.  

## 2013-09-18 NOTE — Progress Notes (Signed)
PROGRESS NOTE  [name] JJH:[id number] DOB: 18-Dec-1960 DOA: 09/16/2013 PCP: Pearson Grippe, MD  Assessment/Plan: Palpitations/chest pain/syncope  -Cardiology has been consulted  -CE negative -echocardiogram ok  -lexiscan with small area of reversibility -tilt test? outpatient -Continue fludrocortisone as well as prednisone -Continue IV fluid  -BNP as well d-dimer negative  - holter monitor arranged as outpatient -for cath today  Behcet's  -Will hold methotrexate  -Continue colchicine and prednisone   GERD  -Continue PPI  Previously abnormal MRI of brain MRI ok- r/o demyelinating disease   Code Status: full Family Communication: patient Disposition Plan:   Consultants:  cardiology  Procedures:    HPI/Subjective: With exertion, still gets tight area in chest  Objective: Filed Vitals:   09/17/13 2035  BP: 119/70  Pulse: 104  Temp: 98.5 F (36.9 C)  Resp: 18    Intake/Output Summary (Last 24 hours) at 09/18/13 1027 Last data filed at 09/18/13 0700  Gross per 24 hour  Intake    500 ml  Output      0 ml  Net    500 ml   There were no vitals filed for this visit.  Exam:   General:  A+Ox3, NAD  Cardiovascular: rrr  Respiratory: clear  Musculoskeletal: no edema  Data Reviewed: Basic Metabolic Panel:  Recent Labs Lab 09/16/13 1128 09/16/13 1545  NA 144  --   K 3.7  --   CL 104  --   CO2 28  --   GLUCOSE 95  --   BUN 14  --   CREATININE 0.51 0.52  CALCIUM 9.5  --   MG  --  1.8  PHOS  --  3.7   Liver Function Tests: No results found for this basename: AST, ALT, ALKPHOS, BILITOT, PROT, ALBUMIN,  in the last 168 hours No results found for this basename: LIPASE, AMYLASE,  in the last 168 hours No results found for this basename: AMMONIA,  in the last 168 hours CBC:  Recent Labs Lab 09/16/13 1128 09/16/13 1545  WBC 5.5 5.4  HGB 12.7 12.1  HCT 36.9 35.7*  MCV 89.3 88.6  PLT 220 195   Cardiac Enzymes:  Recent Labs Lab  09/16/13 1128 09/16/13 1542 09/16/13 2119 09/17/13 0403  TROPONINI <0.30 <0.30 <0.30 <0.30   BNP (last 3 results)  Recent Labs  09/16/13 1128  PROBNP 6.6   CBG: No results found for this basename: GLUCAP,  in the last 168 hours  No results found for this or any previous visit (from the past 240 hour(s)).   Studies: Dg Chest 2 View  09/16/2013   CLINICAL DATA:  Sternal chest pain radiating to the left scapula and left arm. Near syncope.  EXAM: CHEST  2 VIEW  COMPARISON:  08/13/2010  FINDINGS: The heart size and mediastinal contours are within normal limits. Both lungs are clear. The visualized skeletal structures are unremarkable.  IMPRESSION: No active cardiopulmonary disease.   Electronically Signed   By: Geanie Cooley M.D.   On: 09/16/2013 12:23   Mr Laqueta Jean YJ Contrast  09/17/2013   CLINICAL DATA:  Lightheaded and dizziness. Nausea. Rule out demyelinating disease.  EXAM: MRI HEAD WITHOUT AND WITH CONTRAST  TECHNIQUE: Multiplanar, multiecho pulse sequences of the brain and surrounding structures were obtained without and with intravenous contrast.  CONTRAST:  80mL MULTIHANCE GADOBENATE DIMEGLUMINE 529 MG/ML IV SOLN  COMPARISON:  CT head 07/06/2006  FINDINGS: Cerebral volume normal. Ventricle size normal. Craniocervical junction normal.  Negative for acute infarct.  Negative for demyelinating disease. Small right frontal white matter hyperintensity, likely an incidental finding.  Negative for hemorrhage or fluid collection.  Negative for mass or edema. Normal enhancement following contrast administration.  Paranasal sinuses are clear.  IMPRESSION: Negative   Electronically Signed   By: Marlan Palau M.D.   On: 09/17/2013 13:13   Nm Myocar Multi W/spect W/wall Motion / Ef  09/17/2013   ADDENDUM REPORT: 09/17/2013 18:24  ADDENDUM: Correction to the original dictation:  The reversibility is in the mid segments of the anterior and inferior septal wall.  CLINICAL DATA:   Chest pressure.  EXAM:   MYOCARDIAL IMAGING WITH SPECT (REST AND PHARMACOLOGIC-STRESS)  GATED LEFT VENTRICULAR WALL MOTION STUDY  LEFT VENTRICULAR EJECTION FRACTION  TECHNIQUE:  Standard myocardial SPECT imaging was performed after resting  intravenous injection of 10 mCi Tc-34m sestamibi. Subsequently,  intravenous infusion of Lexiscan was performed under the supervision  of the Cardiology staff. At peak effect of the drug, 30 mCi Tc-61m  sestamibi was injected intravenously and standard myocardial SPECT  imaging was performed. Quantitative gated imaging was also performed  to evaluate left ventricular wall motion, and estimate left  ventricular ejection fraction.  COMPARISON:   None.  FINDINGS:  Perfusion: Small area of mild reversibility is noted involving the  mid segments anterior and inferior septal wall. The summed difference score is  equal to 5.  Wall Motion: Normal left ventricular wall motion. No left  ventricular dilation.  Left Ventricular Ejection Fraction:   44% %  End diastolic volume 51 ml  End systolic volume 29 ml  IMPRESSION: IMPRESSION  1. Small area of mild reversibility involves the mid segments of the  anterior and inferior septal wall.  2. Mild to moderate septal and inferior wall hypokinesis. Mild  anterior and lateral wall hypokinesis.  3. Left ventricular ejection fraction 44%.   Electronically Signed   By: Signa Kell M.D.   On: 09/17/2013 18:24   09/17/2013   CLINICAL DATA:  Chest pressure.  EXAM: MYOCARDIAL IMAGING WITH SPECT (REST AND PHARMACOLOGIC-STRESS)  GATED LEFT VENTRICULAR WALL MOTION STUDY  LEFT VENTRICULAR EJECTION FRACTION  TECHNIQUE: Standard myocardial SPECT imaging was performed after resting intravenous injection of 10 mCi Tc-78m sestamibi. Subsequently, intravenous infusion of Lexiscan was performed under the supervision of the Cardiology staff. At peak effect of the drug, 30 mCi Tc-55m sestamibi was injected intravenously and standard myocardial SPECT imaging was performed. Quantitative  gated imaging was also performed to evaluate left ventricular wall motion, and estimate left ventricular ejection fraction.  COMPARISON:  None.  FINDINGS: Perfusion: Small area of mild reversibility is noted involving the mid segment of the lateral wall. The summed difference score is equal to 5.  Wall Motion: Normal left ventricular wall motion. No left ventricular dilation.  Left Ventricular Ejection Fraction:  44% %  End diastolic volume 51 ml  End systolic volume 29 ml  IMPRESSION: 1. Small area of mild reversibility involves the mid segment of the anterior and inferior lateral wall.  2. Mild to moderate septal and inferior wall hypokinesis. Mild anterior and lateral wall hypokinesis.  3. Left ventricular ejection fraction 44%.  Electronically Signed: By: Signa Kell M.D. On: 09/17/2013 18:15    Scheduled Meds: . aspirin EC  325 mg Oral Daily  . calcium carbonate  1 tablet Oral Daily  . cetirizine  10 mg Oral QHS  . cholecalciferol  1,000 Units Oral Daily  . colchicine  0.6 mg Oral BID  . cyclobenzaprine  5 mg Oral BID  . enoxaparin (LOVENOX) injection  40 mg Subcutaneous Q24H  . fludrocortisone  0.1 mg Oral Daily  . folic acid  2 mg Oral Daily  . multivitamin with minerals  1 tablet Oral Daily  . pantoprazole  40 mg Oral Daily  . predniSONE  10 mg Oral Q breakfast  . simvastatin  20 mg Oral q1800  . vitamin B-12  1,000 mcg Oral Daily   Continuous Infusions: . sodium chloride 100 mL/hr at 09/17/13 1020   Antibiotics Given (last 72 hours)   None      Active Problems:   Behcet's disease   Palpitations    Time spent: 25 min    Hatsuko Bizzarro  [facility] Pager [phone]. If 7PM-7AM, please contact night-coverage at [email or web address], password [facility] 09/18/2013, 10:27 AM  LOS: 2 days

## 2013-09-18 NOTE — Interval H&P Note (Signed)
History and Physical Interval Note:  09/18/2013 4:13 PM  [name]  has presented today for surgery, with the diagnosis of cp  The various methods of treatment have been discussed with the patient and family. After consideration of risks, benefits and other options for treatment, the patient has consented to  Procedure(s): LEFT HEART CATHETERIZATION WITH CORONARY ANGIOGRAM (N/A) as a surgical intervention .  The patient's history has been reviewed, patient examined, no change in status, stable for surgery.  I have reviewed the patient's chart and labs.  Questions were answered to the patient's satisfaction.    Cath Lab Visit (complete for each Cath Lab visit)  Clinical Evaluation Leading to the Procedure:   ACS: No.  Non-ACS:    Anginal Classification: CCS II  Anti-ischemic medical therapy: Minimal Therapy (1 class of medications)  Non-Invasive Test Results: Intermediate-risk stress test findings: cardiac mortality 1-3%/year  Prior CABG: No previous CABG       Tonny Bollman

## 2013-09-18 NOTE — CV Procedure (Addendum)
    Cardiac Catheterization Procedure Note  Name: DICIE EDELEN MRN: [id number] DOB: 1960/05/30  Procedure: Left Heart Cath, Selective Coronary Angiography, LV angiography  Indication: Atypical chest pain, syncope, abnormal Myoview scan   Procedural Details: The left wrist was prepped, draped, and anesthetized with 1% lidocaine. Using the modified Seldinger technique, a 5/6 French Slender sheath was introduced into the left radial artery. 3 mg of verapamil was administered through the sheath. Standard Judkins catheters were used for selective coronary angiography and left ventriculography. Catheter exchanges were performed over an exchange length guidewire. There were no immediate procedural complications. A TR band was used for radial hemostasis at the completion of the procedure.  The patient was transferred to the post catheterization recovery area for further monitoring.  Procedural Findings: Hemodynamics: AO 121/69 LV 121/11  Coronary angiography: Coronary dominance: right  Left mainstem: Angiographically normal vessel, divides into the LAD and left circumflex.  Left anterior descending (LAD): No significant disease noted. The vessel is smooth throughout its course. The diagonals are all patent.  Left circumflex (LCx): The left circumflex is widely patent. There is no obstruction noted. There are 2 large obtuse marginal branches without disease.  Right coronary artery (RCA): Dominant, widely patent vessel throughout. Smooth borders without irregularity. The PDA and PLA branches are patent.  Left ventriculography: Left ventricular systolic function is normal, LVEF is estimated at 55-65%, there is no significant mitral regurgitation   Estimated Blood Loss: Minimal  Final Conclusions:   1. Angiographically normal coronary arteries  2. Normal LV function  Recommendations: Suspect noncardiac chest pain  Tonny Bollman MD, [facility] 09/18/2013, 4:41 PM

## 2013-09-19 DIAGNOSIS — M4802 Spinal stenosis, cervical region: Secondary | ICD-10-CM

## 2013-09-19 MED ORDER — FLUDROCORTISONE ACETATE 0.1 MG PO TABS: 0.1000 mg | ORAL_TABLET | Freq: Every day | ORAL | Status: DC

## 2013-09-19 MED FILL — Cetirizine HCl Tab 10 MG: ORAL | Qty: 1 | Status: AC

## 2013-09-19 NOTE — Progress Notes (Signed)
Cardiologist: Dr. Elease Hashimoto  Subjective: At cardiac catheterization yesterday evening, normal. Reassurance. No adverse events on telemetry.  Objective: Vital signs in last 24 hours: Temp:  [97.9 F (36.6 C)-98.8 F (37.1 C)] 97.9 F (36.6 C) (09/15 0553) Pulse Rate:  [75-98] 77 (09/15 0553) Resp:  [18-20] 18 (09/15 0553) BP: (112-122)/(69-80) 116/74 mmHg (09/15 0553) SpO2:  [98 %-100 %] 100 % (09/15 0553) Weight:  [161 lb 8 oz (73.256 kg)] 161 lb 8 oz (73.256 kg) (09/15 0553) Last BM Date: 09/18/13  Intake/Output from previous day: 09/14 0701 - 09/15 0700 In: 800 [P.O.:800] Out: 2500 [Urine:2500] Intake/Output this shift:    Medications Current Facility-Administered Medications  Medication Dose Route Frequency Provider Last Rate Last Dose  . 0.9 %  sodium chloride infusion   Intravenous Continuous Samuel Jester, DO 100 mL/hr at 09/17/13 1020    . 0.9 %  sodium chloride infusion  250 mL Intravenous PRN Tonny Bollman, MD      . 0.9 %  sodium chloride infusion   Intravenous Continuous Tonny Bollman, MD 100 mL/hr at 09/19/13 0211    . acetaminophen (TYLENOL) tablet 650 mg  650 mg Oral Q8H PRN Maryann Mikhail, DO   650 mg at 09/17/13 1326  . aspirin EC tablet 325 mg  325 mg Oral Daily Joseph Art, DO   325 mg at 09/18/13 1815  . calcium carbonate (TUMS - dosed in mg elemental calcium) chewable tablet 200 mg of elemental calcium  1 tablet Oral Daily Maryann Mikhail, DO   200 mg of elemental calcium at 09/18/13 1816  . cetirizine (ZYRTEC) tablet 10 mg  10 mg Oral QHS Maryann Mikhail, DO   10 mg at 09/18/13 2212  . cholecalciferol (VITAMIN D) tablet 1,000 Units  1,000 Units Oral Daily Maryann Mikhail, DO   1,000 Units at 09/18/13 1815  . colchicine tablet 0.6 mg  0.6 mg Oral BID Maryann Mikhail, DO   0.6 mg at 09/18/13 2212  . cyclobenzaprine (FLEXERIL) tablet 5 mg  5 mg Oral BID Maryann Mikhail, DO   5 mg at 09/18/13 2212  . diazepam (VALIUM) tablet 5 mg  5 mg Oral Q6H PRN  Maryann Mikhail, DO   5 mg at 09/17/13 2124  . enoxaparin (LOVENOX) injection 40 mg  40 mg Subcutaneous Q24H Maryann Mikhail, DO   40 mg at 09/18/13 2021  . fludrocortisone (FLORINEF) tablet 0.1 mg  0.1 mg Oral Daily Mihai Croitoru, MD   0.1 mg at 09/18/13 1814  . folic acid (FOLVITE) tablet 2 mg  2 mg Oral Daily Maryann Mikhail, DO   2 mg at 09/18/13 1816  . HYDROcodone-acetaminophen (NORCO/VICODIN) 5-325 MG per tablet 1 tablet  1 tablet Oral Q6H PRN Edsel Petrin, DO   1 tablet at 09/18/13 2021  . Influenza vac split quadrivalent PF (FLUARIX) injection 0.5 mL  0.5 mL Intramuscular Tomorrow-1000 Jessica U Vann, DO      . multivitamin with minerals tablet 1 tablet  1 tablet Oral Daily Maryann Mikhail, DO   1 tablet at 09/18/13 1815  . ondansetron (ZOFRAN) injection 4 mg  4 mg Intravenous Q6H PRN Maryann Mikhail, DO      . pantoprazole (PROTONIX) EC tablet 40 mg  40 mg Oral Daily Maryann Mikhail, DO   40 mg at 09/18/13 1816  . predniSONE (DELTASONE) tablet 10 mg  10 mg Oral Q breakfast Maryann Mikhail, DO   10 mg at 09/19/13 0610  . simvastatin (ZOCOR) tablet 20 mg  20 mg Oral q1800  Joseph Art, DO   20 mg at 09/18/13 1814  . sodium chloride 0.9 % injection 3 mL  3 mL Intravenous Q12H Tonny Bollman, MD   3 mL at 09/18/13 2213  . sodium chloride 0.9 % injection 3 mL  3 mL Intravenous PRN Tonny Bollman, MD      . vitamin B-12 (CYANOCOBALAMIN) tablet 1,000 mcg  1,000 mcg Oral Daily Maryann Mikhail, DO   1,000 mcg at 09/18/13 1816    PE: General appearance: alert, cooperative and no distress Lungs: clear to auscultation bilaterally Heart: regular rate and rhythm, S1, S2 normal, no murmur, click, rub or gallop Extremities: No LEE Pulses: 2+ and symmetric Skin: Warm and dry Neurologic: Grossly normal Catheterization site on the left radial, normal. Lab Results:   Recent Labs  09/16/13 1128 09/16/13 1545  WBC 5.5 5.4  HGB 12.7 12.1  HCT 36.9 35.7*  PLT 220 195   BMET  Recent  Labs  09/16/13 1128 09/16/13 1545  NA 144  --   K 3.7  --   CL 104  --   CO2 28  --   GLUCOSE 95  --   BUN 14  --   CREATININE 0.51 0.52  CALCIUM 9.5  --      Assessment/Plan   Active Problems:   Behcet's disease   Palpitations      Presyncope   Syncope     Chest Pain   Orthostatic hypotension   53 year old female with autoimmune condition, Behcet's disease, with intermittent chest discomfort, left arm pain, postopcervical spine surgery on 08/22/13. Reassuring cardiac catheterization  1. Chest pain-reassuring cardiac catheterization in the setting of false positive for reversible defect along the inferior wall. No further cardiac workup necessary.   Ejection fraction is normal on echocardiogram.  2. Syncope/near-syncope-strong suggestion of hypotension with orthostasis. Also some vagal qualities as well. Dr. Mayford Knife started fludrocortisone 0.1 mg. Continue to monitor basic metabolic profile as outpatient. It is reasonable to continue this medication.  3. Early family history of coronary artery disease. EKG nonspecific T-wave changes  4. Sinus tachycardia improved with IV fluids previously. No need for further Holter monitoring. No adverse arrhythmias while she is here in the hospital.  Discharge home. Followup with primary physician. Check basic metabolic profile on followup given new start fludrocortisone to help with orthostatic hypotension. If symptoms resolve over time, fludrocortisone should be discontinued.  If cardiology followup as needed, Dr. Elease Hashimoto.  Donato Schultz MD  09/19/2013 7:25 AM

## 2013-09-19 NOTE — Progress Notes (Addendum)
Patient oob in room, bp 133/78 heart rate 130, patient ambulated in hallway total of 300 feet, stopped x2 complaint of legs feeling tired 126/77 , heart rate increased to 128, after resting heart rate improved, O2 sats 100 % entire walk. Dr Benjamine Mola notified of ambulation results [name] A

## 2013-09-19 NOTE — Discharge Summary (Signed)
Physician Discharge Summary  [name] FHL:[id number] DOB: 1960-05-15 DOA: 09/16/2013  PCP: Pearson Grippe, MD  Admit date: 09/16/2013 Discharge date: 09/19/2013  Time spent: 35 minutes  Recommendations for Outpatient Follow-up:  1. TILT test- ? POTS syndrome 2. TED hose 3. Cbc, bmp 1 week   Discharge Diagnoses:  Active Problems:   Behcet's disease   Palpitations   Discharge Condition: improved  Diet recommendation: regular  Filed Weights   09/19/13 0553  Weight: 73.256 kg (161 lb 8 oz)    History of present illness:  [name] is a 53 y.o. female  With a history of recent cervical spine surgery, Behcet's syndrome on chronic steroids, irritable bowel syndrome, acid reflux, that presented to the emergency department with complaints of palpitations and near syncope. Patient states her symptoms began several days after her cervical spine surgery. She has been symptomatic for approximately 2 weeks, complained of becoming short of breath after walking short distances, lightheadedness, dizziness, flushing, nausea. The symptoms are relieved with rest with sitting down as well as lying down. Patient has had chest pain which she has noticed more after decreasing her pain medication chest abdomen chest which radiates to her left arm. She states it is more of a pressure. It is relieved with rest. Patient has a very extensive history with autoimmune disease with. There was some change in her steroid dosage recently, and patient does not believe she received no steroids after her surgery. Patient denies any recent travel, ill contacts.   Hospital Course:  Chest pain-reassuring cardiac catheterization in the setting of false positive for reversible defect along the inferior wall. No further cardiac workup necessary.  Ejection fraction is normal on echocardiogram.    Syncope/near-syncope-strong suggestion of hypotension with orthostasis. Also some vagal qualities as well vs POTS.  Dr. Mayford Knife started fludrocortisone 0.1 mg. Continue to monitor basic metabolic profile as outpatient. It is reasonable to continue this medication.    Early family history of coronary artery disease. EKG nonspecific T-wave changes    Sinus tachycardia improved with IV fluids previously. No need for further Holter monitoring. No adverse arrhythmias while she is here in the hospital. Would recommend TILT test as outpatient  bechet's syndrome   Procedures:  Echo  cath  Consultations:  cards  Discharge Exam: Filed Vitals:   09/19/13 1236  BP:   Pulse:   Temp: 98.2 F (36.8 C)  Resp: 20    General: A+Ox3, NAD Cardiovascular: rrr Respiratory: clear anterior  Discharge Instructions You were cared for by a hospitalist during your hospital stay. If you have any questions about your discharge medications or the care you received while you were in the hospital after you are discharged, you can call the unit and asked to speak with the hospitalist on call if the hospitalist that took care of you is not available. Once you are discharged, your primary care physician will handle any further medical issues. Please note that NO REFILLS for any discharge medications will be authorized once you are discharged, as it is imperative that you return to your primary care physician (or establish a relationship with a primary care physician if you do not have one) for your aftercare needs so that they can reassess your need for medications and monitor your lab values.  Discharge Instructions   Diet general    Complete by:  As directed      Discharge instructions    Complete by:  As directed   Outpatient TILT test Cbc,  bmp 1 week No driving until seen by PCP and after TILT Can walk until symptomatic (here about 300 feet per nursing documentation)     Increase activity slowly    Complete by:  As directed           Current Discharge Medication List    START taking these medications   Details   fludrocortisone (FLORINEF) 0.1 MG tablet Take 1 tablet (0.1 mg total) by mouth daily. Qty: 30 tablet, Refills: 0      CONTINUE these medications which have NOT CHANGED   Details  acetaminophen (TYLENOL) 650 MG CR tablet Take 650 mg by mouth every 8 (eight) hours as needed for pain.    aspirin EC 81 MG tablet Take 81 mg by mouth daily.    B Complex-C (B-COMPLEX WITH VITAMIN C) tablet Take 1 tablet by mouth daily.    Calcium-Magnesium-Zinc 333-133-5 MG TABS Take 1 tablet by mouth 3 (three) times daily.     cetirizine (ZYRTEC) 10 MG tablet Take 10 mg by mouth at bedtime.    cholecalciferol (VITAMIN D) 1000 UNITS tablet Take 1,000 Units by mouth daily.    Coconut Oil 1000 MG CAPS Take 1,000 mg by mouth 2 (two) times daily.    colchicine 0.6 MG tablet Take 0.6 mg by mouth 2 (two) times daily. Colcrys   Associated Diagnoses: Behcet's disease    diazepam (VALIUM) 5 MG tablet Take 5 mg by mouth daily as needed for muscle spasms.    folic acid (FOLVITE) 1 MG tablet Take 2 mg by mouth daily.     HYDROcodone-acetaminophen (NORCO) 10-325 MG per tablet Take 0.5 tablets by mouth 2 (two) times daily as needed (pain).    lansoprazole (PREVACID) 15 MG capsule Take 15 mg by mouth at bedtime.     methotrexate 25 MG/ML injection Inject 20 mg into the skin once a week. On Wednesdays    Multiple Vitamin (MULTIVITAMIN WITH MINERALS) TABS Take 1 tablet by mouth daily.    predniSONE (DELTASONE) 5 MG tablet Take 5 mg by mouth 2 (two) times daily with a meal.     Propylene Glycol-Glycerin (SOOTHE OP) Apply 1 drop to eye 3 (three) times daily as needed (scratchy/blurry eyes).    Risedronate Sodium (ATELVIA) 35 MG TBEC Take 35 mg by mouth once a week. Taken on Wednesdays.      STOP taking these medications     OVER THE COUNTER MEDICATION      cyclobenzaprine (FLEXERIL) 5 MG tablet        Allergies  Allergen Reactions  . Celebrex [Celecoxib] Other (See Comments)    Bleeding/bruising  including bleeding for multiple days  . Latex Shortness Of Breath and Dermatitis  . Sulfa Drugs Cross Reactors Other (See Comments)    Eyes turn red, renal failure, stomach problems (vomiting, diarrhea), vasculature collapses, migraine like symptoms  . Sulfites Other (See Comments)    Eyes turn red, renal failure, stomach problems (vomiting, diarrhea), vasculature collapses, migraine like symptoms  . Fentanyl Nausea And Vomiting  . Loratadine Itching  . Simponi [Golimumab] Swelling    Simponi Aria- Headache, blurred vision.  Marland Kitchen Epinephrine Other (See Comments)    Tachycardia  . Erythromycin Itching and Rash    Has tolerated azithromycin  . Morphine And Related Itching and Rash    Includes codeine; can take hydrocodone   Follow-up Information   Follow up with Elyn Aquas., MD On 10/17/2013. (2:15 PM)    Specialty:  Cardiology   Contact  information:   [address] [place] [address] [phone]       Follow up with Laurena Slimmer, MD. (as already scheduled)    Specialty:  Internal Medicine   Contact information:   [address] [place] [address] [phone]        The results of significant diagnostics from this hospitalization (including imaging, microbiology, ancillary and laboratory) are listed below for reference.    Significant Diagnostic Studies: Dg Chest 2 View  09/16/2013   CLINICAL DATA:  Sternal chest pain radiating to the left scapula and left arm. Near syncope.  EXAM: CHEST  2 VIEW  COMPARISON:  08/13/2010  FINDINGS: The heart size and mediastinal contours are within normal limits. Both lungs are clear. The visualized skeletal structures are unremarkable.  IMPRESSION: No active cardiopulmonary disease.   Electronically Signed   By: Geanie Cooley M.D.   On: 09/16/2013 12:23   Dg Cervical Spine 2-3 Views  08/22/2013   CLINICAL DATA:  C4 through C6 fusion.  EXAM: OPERATIVE CERVICAL SPINE - 2-3 VIEW  COMPARISON:  MRI  cervical spine 07/18/2013. Flexion and extension cervical spine imaging 07/24/2013.  FINDINGS: Initial image obtained at 0942 hr and submitted for interpretation postoperatively demonstrates a localizer needle projected over the C4-5 disc space. 2nd image obtained at 1056 hr and submitted for interpretation postoperatively demonstrates ACDF with hardware from C4 through C6 with interbody fusion plugs at C4-5 and C5-6. Plugs are appropriately positioned anteriorly in the disc space. No acute complicating features.  IMPRESSION: ACDF with hardware C4 through C6 with interbody fusion plugs at C4-5 and C5-6 without acute complicating features.   Electronically Signed   By: Hulan Saas M.D.   On: 08/22/2013 11:16   Mr Laqueta Jean ZH Contrast  09/17/2013   CLINICAL DATA:  Lightheaded and dizziness. Nausea. Rule out demyelinating disease.  EXAM: MRI HEAD WITHOUT AND WITH CONTRAST  TECHNIQUE: Multiplanar, multiecho pulse sequences of the brain and surrounding structures were obtained without and with intravenous contrast.  CONTRAST:  28mL MULTIHANCE GADOBENATE DIMEGLUMINE 529 MG/ML IV SOLN  COMPARISON:  CT head 07/06/2006  FINDINGS: Cerebral volume normal. Ventricle size normal. Craniocervical junction normal.  Negative for acute infarct.  Negative for demyelinating disease. Small right frontal white matter hyperintensity, likely an incidental finding.  Negative for hemorrhage or fluid collection.  Negative for mass or edema. Normal enhancement following contrast administration.  Paranasal sinuses are clear.  IMPRESSION: Negative   Electronically Signed   By: Marlan Palau M.D.   On: 09/17/2013 13:13   Nm Myocar Multi W/spect W/wall Motion / Ef  09/17/2013   ADDENDUM REPORT: 09/17/2013 18:24  ADDENDUM: Correction to the original dictation:  The reversibility is in the mid segments of the anterior and inferior septal wall.  CLINICAL DATA:   Chest pressure.  EXAM:  MYOCARDIAL IMAGING WITH SPECT (REST AND  PHARMACOLOGIC-STRESS)  GATED LEFT VENTRICULAR WALL MOTION STUDY  LEFT VENTRICULAR EJECTION FRACTION  TECHNIQUE:  Standard myocardial SPECT imaging was performed after resting  intravenous injection of 10 mCi Tc-87m sestamibi. Subsequently,  intravenous infusion of Lexiscan was performed under the supervision  of the Cardiology staff. At peak effect of the drug, 30 mCi Tc-28m  sestamibi was injected intravenously and standard myocardial SPECT  imaging was performed. Quantitative gated imaging was also performed  to evaluate left ventricular wall motion, and estimate left  ventricular ejection fraction.  COMPARISON:   None.  FINDINGS:  Perfusion: Small area of mild reversibility  is noted involving the  mid segments anterior and inferior septal wall. The summed difference score is  equal to 5.  Wall Motion: Normal left ventricular wall motion. No left  ventricular dilation.  Left Ventricular Ejection Fraction:   44% %  End diastolic volume 51 ml  End systolic volume 29 ml  IMPRESSION: IMPRESSION  1. Small area of mild reversibility involves the mid segments of the  anterior and inferior septal wall.  2. Mild to moderate septal and inferior wall hypokinesis. Mild  anterior and lateral wall hypokinesis.  3. Left ventricular ejection fraction 44%.   Electronically Signed   By: Signa Kell M.D.   On: 09/17/2013 18:24   09/17/2013   CLINICAL DATA:  Chest pressure.  EXAM: MYOCARDIAL IMAGING WITH SPECT (REST AND PHARMACOLOGIC-STRESS)  GATED LEFT VENTRICULAR WALL MOTION STUDY  LEFT VENTRICULAR EJECTION FRACTION  TECHNIQUE: Standard myocardial SPECT imaging was performed after resting intravenous injection of 10 mCi Tc-42m sestamibi. Subsequently, intravenous infusion of Lexiscan was performed under the supervision of the Cardiology staff. At peak effect of the drug, 30 mCi Tc-52m sestamibi was injected intravenously and standard myocardial SPECT imaging was performed. Quantitative gated imaging was also performed to  evaluate left ventricular wall motion, and estimate left ventricular ejection fraction.  COMPARISON:  None.  FINDINGS: Perfusion: Small area of mild reversibility is noted involving the mid segment of the lateral wall. The summed difference score is equal to 5.  Wall Motion: Normal left ventricular wall motion. No left ventricular dilation.  Left Ventricular Ejection Fraction:  44% %  End diastolic volume 51 ml  End systolic volume 29 ml  IMPRESSION: 1. Small area of mild reversibility involves the mid segment of the anterior and inferior lateral wall.  2. Mild to moderate septal and inferior wall hypokinesis. Mild anterior and lateral wall hypokinesis.  3. Left ventricular ejection fraction 44%.  Electronically Signed: By: Signa Kell M.D. On: 09/17/2013 18:15    Microbiology: No results found for this or any previous visit (from the past 240 hour(s)).   Labs: Basic Metabolic Panel:  Recent Labs Lab 09/16/13 1128 09/16/13 1545  NA 144  --   K 3.7  --   CL 104  --   CO2 28  --   GLUCOSE 95  --   BUN 14  --   CREATININE 0.51 0.52  CALCIUM 9.5  --   MG  --  1.8  PHOS  --  3.7   Liver Function Tests: No results found for this basename: AST, ALT, ALKPHOS, BILITOT, PROT, ALBUMIN,  in the last 168 hours No results found for this basename: LIPASE, AMYLASE,  in the last 168 hours No results found for this basename: AMMONIA,  in the last 168 hours CBC:  Recent Labs Lab 09/16/13 1128 09/16/13 1545  WBC 5.5 5.4  HGB 12.7 12.1  HCT 36.9 35.7*  MCV 89.3 88.6  PLT 220 195   Cardiac Enzymes:  Recent Labs Lab 09/16/13 1128 09/16/13 1542 09/16/13 2119 09/17/13 0403  TROPONINI <0.30 <0.30 <0.30 <0.30   BNP: BNP (last 3 results)  Recent Labs  09/16/13 1128  PROBNP 6.6   CBG: No results found for this basename: GLUCAP,  in the last 168 hours     Signed:  Benjamine Mola, JESSICA  [facility] 09/19/2013, 1:19 PM

## 2013-09-19 NOTE — Progress Notes (Signed)
Reviewed all discharge instructions with patients, questions answered. Awaiting transportation home per her spouse. Denies discomfort at present . Pt's son at bedside Georgette Dover

## 2013-09-21 ENCOUNTER — Telehealth: Payer: Self-pay | Admitting: Cardiovascular Disease

## 2013-09-21 NOTE — Telephone Encounter (Signed)
New message  Pt called to schedule tilt table test// please call

## 2013-09-25 ENCOUNTER — Encounter: Payer: Self-pay | Admitting: Cardiovascular Disease

## 2013-09-25 ENCOUNTER — Ambulatory Visit (INDEPENDENT_AMBULATORY_CARE_PROVIDER_SITE_OTHER): Payer: 59 | Admitting: Cardiovascular Disease

## 2013-09-25 VITALS — BP 120/88 | HR 89 | Ht 65.0 in | Wt 165.0 lb

## 2013-09-25 DIAGNOSIS — E2749 Other adrenocortical insufficiency: Secondary | ICD-10-CM

## 2013-09-25 DIAGNOSIS — E2740 Unspecified adrenocortical insufficiency: Secondary | ICD-10-CM | POA: Insufficient documentation

## 2013-09-25 DIAGNOSIS — R002 Palpitations: Secondary | ICD-10-CM

## 2013-09-25 DIAGNOSIS — M352 Behcet's disease: Secondary | ICD-10-CM

## 2013-09-25 MED ORDER — METOPROLOL SUCCINATE ER 25 MG PO TB24: 25.0000 mg | ORAL_TABLET | Freq: Every day | ORAL | Status: DC

## 2013-09-25 NOTE — Progress Notes (Signed)
[name] Date of Birth  02/02/1960       [facility]    [place] [address]  [address] [place], [place]  [address]   [place], [place]  [address] [phone]     [phone]   Fax  [phone]     Fax [phone]  Problem List: 1. Orthostatic hypotension - likely due to adrenal insufficiency 2. Back surgery-lumbar surgery, cervical fusion 3. Normal coronaries by cath Sept. 2015 4. Adrenal insufficiency - ? Possibly caused by long term prednisone 5. Behcets syncrome- ( an autoimmune vasculitis , ulcers in mouth, nose,  crohns like symptoms)     - History of Present Illness:  [name] is a 53 yo - originally seen by Dr. Royann Shivers for an episode of syncope and CP at [facility] hospital.  She has had orthostatic hypotension.  Was started on floronif and has improved.  Less fatigue. She does not have the severe nausea. She still has episodes of tachycardia and leg fatigue.       Current Outpatient Prescriptions on File Prior to Visit  Medication Sig Dispense Refill  . acetaminophen (TYLENOL) 650 MG CR tablet Take 650 mg by mouth every 8 (eight) hours as needed for pain.      Marland Kitchen aspirin EC 81 MG tablet Take 81 mg by mouth daily.      . B Complex-C (B-COMPLEX WITH VITAMIN C) tablet Take 1 tablet by mouth daily.      . Calcium-Magnesium-Zinc 333-133-5 MG TABS Take 1 tablet by mouth 3 (three) times daily.       . cetirizine (ZYRTEC) 10 MG tablet Take 10 mg by mouth at bedtime.      . cholecalciferol (VITAMIN D) 1000 UNITS tablet Take 1,000 Units by mouth daily.      . Coconut Oil 1000 MG CAPS Take 1,000 mg by mouth 2 (two) times daily.      . colchicine 0.6 MG tablet Take 0.6 mg by mouth 2 (two) times daily. Colcrys      . diazepam (VALIUM) 5 MG tablet Take 5 mg by mouth daily as needed for muscle spasms.      . fludrocortisone (FLORINEF) 0.1 MG tablet Take 1 tablet (0.1 mg total) by mouth daily.  30 tablet  0  . folic acid  (FOLVITE) 1 MG tablet Take 2 mg by mouth daily.       Marland Kitchen HYDROcodone-acetaminophen (NORCO) 10-325 MG per tablet Take 0.5 tablets by mouth 2 (two) times daily as needed (pain).      Marland Kitchen lansoprazole (PREVACID) 15 MG capsule Take 15 mg by mouth at bedtime.       . methotrexate 25 MG/ML injection Inject 20 mg into the skin once a week. On Wednesdays      . Multiple Vitamin (MULTIVITAMIN WITH MINERALS) TABS Take 1 tablet by mouth daily.      . predniSONE (DELTASONE) 5 MG tablet Take 5 mg by mouth 2 (two) times daily with a meal.       . Propylene Glycol-Glycerin (SOOTHE OP) Apply 1 drop to eye 3 (three) times daily as needed (scratchy/blurry eyes).      . Risedronate Sodium (ATELVIA) 35 MG TBEC Take 35 mg by mouth once a week. Taken on Wednesdays.       No current facility-administered medications on file prior to visit.    Allergies  Allergen Reactions  . Celebrex [Celecoxib] Other (See Comments)    Bleeding/bruising  including bleeding for multiple days  . Latex Shortness Of Breath and Dermatitis  . Sulfa Drugs Cross Reactors Other (See Comments)    Eyes turn red, renal failure, stomach problems (vomiting, diarrhea), vasculature collapses, migraine like symptoms  . Sulfites Other (See Comments)    Eyes turn red, renal failure, stomach problems (vomiting, diarrhea), vasculature collapses, migraine like symptoms  . Fentanyl Nausea And Vomiting  . Loratadine Itching  . Simponi [Golimumab] Swelling    Simponi Aria- Headache, blurred vision.  Marland Kitchen Epinephrine Other (See Comments)    Tachycardia  . Erythromycin Itching and Rash    Has tolerated azithromycin  . Morphine And Related Itching and Rash    Includes codeine; can take hydrocodone    Past Medical History  Diagnosis Date  . Endometriosis   . Endometrial polyp   . IBS (irritable bowel syndrome)   . Acid reflux   . Colitis   . Behcet's syndrome   . GERD (gastroesophageal reflux disease)   . Uveitis   . Osteopenia   . Adrenal  insufficiency   . Subjective visual disturbance 07/14/2012  . Tear of lateral meniscus of right knee, current     Tear of lateral & medial meniscus of right knee  . Asthma   . PONV (postoperative nausea and vomiting)     also low blood pressure at times  . Pneumonia     20 years ago  . Refusal of blood transfusions as patient is Jehovah's Witness   . Chronic back pain   . Chronic neck pain     Past Surgical History  Procedure Laterality Date  . Cholecystectomy  12/2004  . Pelvic laparoscopy      DL  . Dilation and curettage of uterus    . Hysteroscopy    . Cesarean section  03/2001  . Knee surgery Left 05/1998  . Hand surgery    . Breast surgery      Cysts excised  . Oophorectomy      BSO  . Vaginal hysterectomy  07/2007    LAVH BSO  . Back surgery    . Knee arthroscopy with lateral menisectomy Right 12/12/2012    Procedure: KNEE ARTHROSCOPY WITH LATERAL MENISECTOMY;  Surgeon: Nilda Simmer, MD;  Location: [facility];  Service: Orthopedics;  Laterality: Right;  . Thyroid cyst excision    . Anterior fusion cervical spine  08/22/2013    c4 5  6         . Anterior cervical decomp/discectomy fusion N/A 08/22/2013    Procedure: ANTERIOR CERVICAL DECOMPRESSION/DISCECTOMY FUSION 2 LEVELS;  Surgeon: Karn Cassis, MD;  Location: [facility];  Service: Neurosurgery;  Laterality: N/A;  C4-5 C5-6 Anterior cervical decompression/diskectomy/fusion  . Cardiac catheterization  09/18/2013    History  Smoking status  . Never Smoker   Smokeless tobacco  . Never Used    History  Alcohol Use No    Family History  Problem Relation Age of Onset  . Diabetes Mother   . Breast cancer Mother     Age 40's  . Stroke Mother   . Gastric cancer Father   . Diabetes Sister   . Hypertension Sister   . Breast cancer Sister     Age 59    Reviw of Systems:  Reviewed in the HPI.  All other systems are negative.  Physical Exam: Blood pressure 120/88, pulse 89, height 5\' 5"   (1.651 m), weight 165 lb (74.844 kg). Wt Readings from Last 3  Encounters:  09/25/13 165 lb (74.844 kg)  09/19/13 161 lb 8 oz (73.256 kg)  09/19/13 161 lb 8 oz (73.256 kg)     General: Well developed, well nourished, in no acute distress.  Head: Normocephalic, atraumatic, sclera non-icteric, mucus membranes are moist,   Neck: Supple. Carotids are 2 + without bruits. No JVD   Lungs: Clear   Heart: RR, tachycardia   Abdomen: Soft, non-tender, non-distended with normal bowel sounds.  Msk:  Strength and tone are normal   Extremities: No clubbing or cyanosis. No edema.  Distal pedal pulses are 2+ and equal    Neuro: CN II - XII intact.  Alert and oriented X 3.   Walks with a walker to prevent falling.   Psych:  Normal   ECG: Sept. 21, 2015:  NSR at 50. Low voltage, otherwise  Normal   Assessment / Plan:

## 2013-09-25 NOTE — Assessment & Plan Note (Addendum)
[name] is seen today.  She has previosly been seen By Cr. Croitoru.  She has been on steroids for many years. She was recently found to have orthostatic hypotension and a resting 4 AM cortisol level was 0.5. She was determined to be adrenal insufficient and started on steroids and Florinef.  I did not see that a Cortrosyn Stim test was performed.   He's done quite a bit better. She is scheduled to see a new endocrinologist who will further define her adrenal insufficiency. Will have her drink V-8 and water with electrolytes.  Continue current meds including steroids and florinef.    In my opinion I do not think that she needs a tilt table test. I suspect that her symptoms of orthostatic hypotension are  Due  to her adrenal insufficiency

## 2013-09-25 NOTE — Assessment & Plan Note (Signed)
She has resting tachycardia.  Will start Toprol XL 25 a day Will have her see Dr. Graciela Husbands if she has signs / symptoms of POTS once her adrenal insufficiency issues have been resolved.

## 2013-09-25 NOTE — Telephone Encounter (Signed)
Spoke with patient who states she was told upon hospital discharge that she cannot drive until she has Tilt table test and is cleared by cardiology.  Patient is scheduled to see Dr. Elease Hashimoto 10/13 and is requesting sooner appointment.  I advised patient that I will give her name and phone number to our scheduler and if someone cancels we will try to move her appointment up to next week.  Patient verbalized understanding and agreement.

## 2013-09-25 NOTE — Patient Instructions (Addendum)
Increase your intake of fluids (water with electrolyte tabs like Nun tablets, or gatorade) , protein ( hard boiled eggs, chicken, fish) , and a electrolytes ( V-8 juice, salt, potassium chloride  which is sold as No-Salt  Your physician has recommended you make the following change in your medication:  START Toprol XL 25 mg once daily  Your physician recommends that you schedule a follow-up appointment in: 3 months with Dr. Elease Hashimoto.

## 2013-10-16 ENCOUNTER — Ambulatory Visit: Payer: 59 | Admitting: Interventional Cardiology

## 2013-10-17 ENCOUNTER — Encounter: Payer: 59 | Admitting: Cardiovascular Disease

## 2013-11-06 ENCOUNTER — Encounter: Payer: Self-pay | Admitting: Cardiovascular Disease

## 2013-12-07 ENCOUNTER — Ambulatory Visit (INDEPENDENT_AMBULATORY_CARE_PROVIDER_SITE_OTHER): Payer: 59 | Admitting: Cardiovascular Disease

## 2013-12-07 ENCOUNTER — Encounter: Payer: Self-pay | Admitting: Cardiovascular Disease

## 2013-12-07 VITALS — BP 124/78 | HR 80 | Ht 65.0 in | Wt 172.8 lb

## 2013-12-07 DIAGNOSIS — E2740 Unspecified adrenocortical insufficiency: Secondary | ICD-10-CM

## 2013-12-07 DIAGNOSIS — I951 Orthostatic hypotension: Secondary | ICD-10-CM | POA: Insufficient documentation

## 2013-12-07 DIAGNOSIS — M352 Behcet's disease: Secondary | ICD-10-CM

## 2013-12-07 MED ORDER — METOPROLOL SUCCINATE ER 50 MG PO TB24: 50.0000 mg | ORAL_TABLET | Freq: Every day | ORAL | Status: DC

## 2013-12-07 NOTE — Assessment & Plan Note (Signed)
[name] continues to have problems with orthostatic hypotension and episodes of tachycardia. These episodes are complicated by her adrenal insufficiency and the fact that she is so very deconditioned from her 3 diffenent surgical procedures this past year.  Her main complaint is that of tachycardia with minimal exertion. Will increase her Toprol to 50 mg a day.  At this point I dont have much more to offer her. She would like to sed Dr. Graciela Husbands for evaluation for POTS. She initially saw Dr. Royann Shivers in the hospital and was then sent to see me in the office.  She would like to see Dr. Royann Shivers again for follow up.

## 2013-12-07 NOTE — Patient Instructions (Signed)
Your physician has recommended you make the following change in your medication:  INCREASE Toprol to 50 mg daily  Your physician recommends that you schedule a follow-up appointment in: 3 months with Dr. Royann Shivers   You have been referred to see Dr. Graciela Husbands for POTS - please make an appointment with him

## 2013-12-07 NOTE — Progress Notes (Signed)
[name] Date of Birth  Apr 01, 1960       [facility]    [place] [address]  [address], [address] [place], [place]  [address]   [place], [place]  [address] [phone]     [phone]   Fax  [phone]     Fax [phone]  Problem List: 1. Orthostatic hypotension - likely due to adrenal insufficiency 2. Back surgery-lumbar surgery, cervical fusion 3. Normal coronaries by cath Sept. 2015 4. Adrenal insufficiency - ? Possibly caused by long term prednisone 5. Behcets syncrome- ( an autoimmune vasculitis , ulcers in mouth, nose,  crohns like symptoms)  6. Sickle Cell trait:   7.    - History of Present Illness:  [name] is a 53 yo - originally seen by Dr. Royann Shivers for an episode of syncope and CP at [facility] hospital.  She has had orthostatic hypotension.  Was started on floronif and has improved.  Less fatigue. She does not have the severe nausea. She still has episodes of tachycardia and leg fatigue.    Dec. 3, 2015:  [name] is seen today for her orthostatic hypotension. She also has adrenal dysfunction an is on prednisone.  She is seeing a new endocrinologist.  Still gets tachycardic with any exertion  - uses the scooter in grocery stores. She had cervical spine surgery in August and has not felt well since that time.   Gets dizzy, short of breath and generally weak with any exertion. She has some pain in her chest with activity.   She had a myoview in Sept. During her hospitalization and a subsequent cath was normal.       Current Outpatient Prescriptions on File Prior to Visit  Medication Sig Dispense Refill  . acetaminophen (TYLENOL) 650 MG CR tablet Take 650 mg by mouth every 8 (eight) hours as needed for pain.    Marland Kitchen aspirin EC 81 MG tablet Take 81 mg by mouth daily.    . B Complex-C (B-COMPLEX WITH VITAMIN C) tablet Take 1 tablet by mouth daily.    . Calcium-Magnesium-Zinc 333-133-5 MG TABS Take 1 tablet by  mouth 3 (three) times daily.     . cetirizine (ZYRTEC) 10 MG tablet Take 10 mg by mouth at bedtime.    . cholecalciferol (VITAMIN D) 1000 UNITS tablet Take 1,000 Units by mouth daily.    . Coconut Oil 1000 MG CAPS Take 1,000 mg by mouth 2 (two) times daily.    . colchicine 0.6 MG tablet Take 0.6 mg by mouth 2 (two) times daily. Colcrys    . diazepam (VALIUM) 5 MG tablet Take 2.5 mg by mouth daily as needed for muscle spasms. AT BEDTIME    . fludrocortisone (FLORINEF) 0.1 MG tablet Take 1 tablet (0.1 mg total) by mouth daily. 30 tablet 0  . folic acid (FOLVITE) 1 MG tablet Take 2 mg by mouth daily.     Marland Kitchen HYDROcodone-acetaminophen (NORCO) 10-325 MG per tablet Take 0.5 tablets by mouth 2 (two) times daily as needed (pain).    Marland Kitchen lansoprazole (PREVACID) 15 MG capsule Take 15 mg by mouth at bedtime.     . methotrexate 25 MG/ML injection Inject 20 mg into the skin once a week. On Wednesdays    . metoprolol succinate (TOPROL XL) 25 MG 24 hr tablet Take 1 tablet (25 mg total) by mouth daily. 31 tablet 11  . Multiple Vitamin (MULTIVITAMIN WITH MINERALS) TABS Take 1 tablet by mouth  daily.    . predniSONE (DELTASONE) 5 MG tablet Take 7.5 mg by mouth daily with breakfast.     . Propylene Glycol-Glycerin (SOOTHE OP) Apply 1 drop to eye 3 (three) times daily as needed (scratchy/blurry eyes).    . Risedronate Sodium (ATELVIA) 35 MG TBEC Take 35 mg by mouth once a week. Taken on Wednesdays.     No current facility-administered medications on file prior to visit.    Allergies  Allergen Reactions  . Celebrex [Celecoxib] Other (See Comments)    Bleeding/bruising including bleeding for multiple days  . Latex Shortness Of Breath and Dermatitis  . Sulfa Drugs Cross Reactors Other (See Comments)    Eyes turn red, renal failure, stomach problems (vomiting, diarrhea), vasculature collapses, migraine like symptoms  . Sulfites Other (See Comments)    Eyes turn red, renal failure, stomach problems (vomiting,  diarrhea), vasculature collapses, migraine like symptoms  . Fentanyl Nausea And Vomiting  . Loratadine Itching  . Simponi [Golimumab] Swelling    Simponi Aria- Headache, blurred vision.  Marland Kitchen Epinephrine Other (See Comments)    Tachycardia  . Erythromycin Itching and Rash    Has tolerated azithromycin  . Morphine And Related Itching and Rash    Includes codeine; can take hydrocodone    Past Medical History  Diagnosis Date  . Endometriosis   . Endometrial polyp   . IBS (irritable bowel syndrome)   . Acid reflux   . Colitis   . Behcet's syndrome   . GERD (gastroesophageal reflux disease)   . Uveitis   . Osteopenia   . Adrenal insufficiency   . Subjective visual disturbance 07/14/2012  . Tear of lateral meniscus of right knee, current     Tear of lateral & medial meniscus of right knee  . Asthma   . PONV (postoperative nausea and vomiting)     also low blood pressure at times  . Pneumonia     20 years ago  . Refusal of blood transfusions as patient is Jehovah's Witness   . Chronic back pain   . Chronic neck pain     Past Surgical History  Procedure Laterality Date  . Cholecystectomy  12/2004  . Pelvic laparoscopy      DL  . Dilation and curettage of uterus    . Hysteroscopy    . Cesarean section  03/2001  . Knee surgery Left 05/1998  . Hand surgery    . Breast surgery      Cysts excised  . Oophorectomy      BSO  . Vaginal hysterectomy  07/2007    LAVH BSO  . Back surgery    . Knee arthroscopy with lateral menisectomy Right 12/12/2012    Procedure: KNEE ARTHROSCOPY WITH LATERAL MENISECTOMY;  Surgeon: Nilda Simmer, MD;  Location: [facility];  Service: Orthopedics;  Laterality: Right;  . Thyroid cyst excision    . Anterior fusion cervical spine  08/22/2013    c4 5  6         . Anterior cervical decomp/discectomy fusion N/A 08/22/2013    Procedure: ANTERIOR CERVICAL DECOMPRESSION/DISCECTOMY FUSION 2 LEVELS;  Surgeon: Karn Cassis, MD;  Location: [facility];  Service: Neurosurgery;  Laterality: N/A;  C4-5 C5-6 Anterior cervical decompression/diskectomy/fusion  . Cardiac catheterization  09/18/2013    History  Smoking status  . Never Smoker   Smokeless tobacco  . Never Used    History  Alcohol Use No    Family History  Problem  Relation Age of Onset  . Diabetes Mother   . Breast cancer Mother     Age 23's  . Stroke Mother   . Gastric cancer Father   . Diabetes Sister   . Hypertension Sister   . Breast cancer Sister     Age 46    Reviw of Systems:  Reviewed in the HPI.  All other systems are negative.  Physical Exam: Blood pressure 140/90, pulse 80, height 5\' 5"  (1.651 m), weight 172 lb 12.8 oz (78.382 kg), SpO2 97 %. Wt Readings from Last 3 Encounters:  12/07/13 172 lb 12.8 oz (78.382 kg)  09/25/13 165 lb (74.844 kg)  09/19/13 161 lb 8 oz (73.256 kg)     General: Well developed, well nourished, in no acute distress.  Head: Normocephalic, atraumatic, sclera non-icteric, mucus membranes are moist,   Neck: Supple. Carotids are 2 + without bruits. No JVD   Lungs: Clear   Heart: RR, tachycardia   Abdomen: Soft, non-tender, non-distended with normal bowel sounds.  Msk:  Strength and tone are normal   Extremities: No clubbing or cyanosis. No edema.  Distal pedal pulses are 2+ and equal    Neuro: CN II - XII intact.  Alert and oriented X 3.   Walks with a walker to prevent falling.   Psych:  Normal   ECG: Sept. 21, 2015:  NSR at 61. Low voltage, otherwise  Normal   Assessment / Plan:

## 2013-12-14 ENCOUNTER — Encounter (HOSPITAL_COMMUNITY): Payer: Self-pay | Admitting: Cardiovascular Disease

## 2014-01-01 ENCOUNTER — Ambulatory Visit (INDEPENDENT_AMBULATORY_CARE_PROVIDER_SITE_OTHER): Payer: 59 | Admitting: Internal Medicine

## 2014-01-01 ENCOUNTER — Encounter: Payer: Self-pay | Admitting: Internal Medicine

## 2014-01-01 VITALS — BP 111/72 | HR 75 | Ht 65.0 in | Wt 167.6 lb

## 2014-01-01 DIAGNOSIS — M352 Behcet's disease: Secondary | ICD-10-CM

## 2014-01-01 DIAGNOSIS — E273 Drug-induced adrenocortical insufficiency: Secondary | ICD-10-CM

## 2014-01-01 DIAGNOSIS — I951 Orthostatic hypotension: Secondary | ICD-10-CM

## 2014-01-01 DIAGNOSIS — R55 Syncope and collapse: Secondary | ICD-10-CM

## 2014-01-01 DIAGNOSIS — W19XXXA Unspecified fall, initial encounter: Secondary | ICD-10-CM

## 2014-01-01 NOTE — Progress Notes (Signed)
ELECTROPHYSIOLOGY CONSULT NOTE  Patient ID: [name], MRN: [id number], DOB/AGE: [birth year] 53 y.o. Admit date: (Not on file) Date of Consult: 01/01/2014  Primary Physician: Pearson Grippe, MD Primary Cardiologist: PNahser  Chief Complaint: Do I have POTS   HPI [name] is a 53 y.o. female  refeerred for question of POTS  She has a complex medical history including a long-standing history of Bechets syndrome for which she has been on steroids intermittently since 1989 and rather chronically since 2005. She had been diagnosed with mononeuritis multiplex about 10 years ago. She ended up requiring back surgery in 2009 again in 2014 and cervical spine surgery in September 2015.  She started having falls about a year ago and this progressed to profound weakness and inability to get around over the next few months.  In the months preceding and around the time of her C-spine surgery she began having more problems with standing. She described after walking a distance becoming lightheaded and nauseated with flushing. She was started at that hospitalization on fludrocortisone which is markedly attenuated her symptoms. This occurred in the context of was thought to be iatrogenic adrenal insufficiency from chronic steroid use. She was also started on low-dose beta-blockade and this has been markedly beneficial   When hospitalized at that time HR change was about 20 with max peak of 103  Echo 9/15  >>normal LVef Cath Normal following abnormal myoview  She continues to complain of problems with prolonged standing wherein her "legs tremble" this can be followed by diarrhea.     Past Medical History  Diagnosis Date  . Endometriosis   . Endometrial polyp   . IBS (irritable bowel syndrome)   . Acid reflux   . Colitis   . Behcet's syndrome   . GERD (gastroesophageal reflux disease)   . Uveitis   . Osteopenia   . Adrenal insufficiency   . Subjective visual disturbance  07/14/2012  . Tear of lateral meniscus of right knee, current     Tear of lateral & medial meniscus of right knee  . Asthma   . PONV (postoperative nausea and vomiting)     also low blood pressure at times  . Pneumonia     20 years ago  . Refusal of blood transfusions as patient is Jehovah's Witness   . Chronic back pain   . Chronic neck pain       Surgical History:  Past Surgical History  Procedure Laterality Date  . Cholecystectomy  12/2004  . Pelvic laparoscopy      DL  . Dilation and curettage of uterus    . Hysteroscopy    . Cesarean section  03/2001  . Knee surgery Left 05/1998  . Hand surgery    . Breast surgery      Cysts excised  . Oophorectomy      BSO  . Vaginal hysterectomy  07/2007    LAVH BSO  . Back surgery    . Knee arthroscopy with lateral menisectomy Right 12/12/2012    Procedure: KNEE ARTHROSCOPY WITH LATERAL MENISECTOMY;  Surgeon: Nilda Simmer, MD;  Location:  SUR[facility]ERY [facility];  Service: Orthopedics;  Laterality: Right;  . Thyroid cyst excision    . Anterior fusion cervical spine  08/22/2013    c4 5  6         . Anterior cervical decomp/discectomy fusion N/A 08/22/2013    Procedure: ANTERIOR CERVICAL DECOMPRESSION/DISCECTOMY FUSION 2 LEVELS;  Surgeon: Karn Cassis, MD;  Location: [facility];  Service: Neurosurgery;  Laterality: N/A;  C4-5 C5-6 Anterior cervical decompression/diskectomy/fusion  . Cardiac catheterization  09/18/2013  . Left heart catheterization with coronary angiogram N/A 09/18/2013    Procedure: LEFT HEART CATHETERIZATION WITH CORONARY ANGIOGRAM;  Surgeon: Micheline Chapman, MD;  Location: [facility] CATH LAB;  Service: Cardiovascular;  Laterality: N/A;     Home Meds: Prior to Admission medications   Medication Sig Start Date End Date Taking? Authorizing Provider  acetaminophen (TYLENOL) 650 MG CR tablet Take 650 mg by mouth every 8 (eight) hours as needed for pain.   Yes Historical Provider, MD  albuterol (PROVENTIL HFA;VENTOLIN HFA)  108 (90 BASE) MCG/ACT inhaler Inhale 1-2 puffs into the lungs every 6 (six) hours as needed for wheezing or shortness of breath.   Yes Historical Provider, MD  aspirin EC 81 MG tablet Take 81 mg by mouth daily.   Yes Historical Provider, MD  B Complex-C (B-COMPLEX WITH VITAMIN C) tablet Take 1 tablet by mouth daily.   Yes Historical Provider, MD  Calcium-Magnesium-Zinc 561 108 0149 MG TABS Take 1 tablet by mouth 3 (three) times daily.  12/19/10  Yes Sanjuana Letters, MD  cetirizine (ZYRTEC) 10 MG tablet Take 10 mg by mouth at bedtime.   Yes Historical Provider, MD  cholecalciferol (VITAMIN D) 1000 UNITS tablet Take 1,000 Units by mouth daily.   Yes Historical Provider, MD  Coconut Oil 1000 MG CAPS Take 1,000 mg by mouth 2 (two) times daily.   Yes Historical Provider, MD  colchicine 0.6 MG tablet Take 0.6 mg by mouth 2 (two) times daily. Colcrys 12/19/10  Yes Sanjuana Letters, MD  diazepam (VALIUM) 5 MG tablet Take 2.5 mg by mouth daily as needed for muscle spasms. AT BEDTIME   Yes Historical Provider, MD  DULoxetine (CYMBALTA) 30 MG capsule Take 30 mg by mouth daily.   Yes Historical Provider, MD  fludrocortisone (FLORINEF) 0.1 MG tablet Take 1 tablet (0.1 mg total) by mouth daily. 09/19/13  Yes Joseph Art, DO  folic acid (FOLVITE) 1 MG tablet Take 2 mg by mouth daily.  05/15/13 05/15/14 Yes Historical Provider, MD  HYDROcodone-acetaminophen (NORCO) 10-325 MG per tablet Take 0.5 tablets by mouth 2 (two) times daily as needed (pain).   Yes Historical Provider, MD  lansoprazole (PREVACID) 15 MG capsule Take 15 mg by mouth at bedtime.    Yes Historical Provider, MD  methotrexate 25 MG/ML injection Inject 25 mg into the skin once a week. On Wednesdays 05/15/13 01/29/15 Yes Historical Provider, MD  metoprolol succinate (TOPROL-XL) 50 MG 24 hr tablet Take 1 tablet (50 mg total) by mouth daily. 12/07/13  Yes Vesta Mixer, MD  Multiple Vitamin (MULTIVITAMIN WITH MINERALS) TABS Take 1 tablet by mouth  daily.   Yes Historical Provider, MD  predniSONE (DELTASONE) 5 MG tablet Take 7.5 mg by mouth daily with breakfast.    Yes Historical Provider, MD  Propylene Glycol-Glycerin (SOOTHE OP) Apply 1 drop to eye 3 (three) times daily as needed (scratchy/blurry eyes).   Yes Historical Provider, MD  Risedronate Sodium (ATELVIA) 35 MG TBEC Take 35 mg by mouth once a week. Taken on Wednesdays.   Yes Historical Provider, MD       Allergies:  Allergies  Allergen Reactions  . Celebrex [Celecoxib] Other (See Comments)    Bleeding/bruising including bleeding for multiple days  . Latex Shortness Of Breath and Dermatitis  . Sulfa Drugs Cross Reactors Other (See Comments)    Eyes turn red, renal  failure, stomach problems (vomiting, diarrhea), vasculature collapses, migraine like symptoms  . Sulfites Other (See Comments)    Eyes turn red, renal failure, stomach problems (vomiting, diarrhea), vasculature collapses, migraine like symptoms  . Fentanyl Nausea And Vomiting  . Loratadine Itching  . Simponi [Golimumab] Swelling    Simponi Aria- Headache, blurred vision.  Marland Kitchen Epinephrine Other (See Comments)    Tachycardia  . Erythromycin Itching and Rash    Has tolerated azithromycin  . Morphine And Related Itching and Rash    Includes codeine; can take hydrocodone    History   Social History  . Marital Status: Legally Separated    Spouse Name: N/A    Number of Children: 1  . Years of Education: N/A   Occupational History  .  [employer]   Social History Main Topics  . Smoking status: Never Smoker   . Smokeless tobacco: Never Used  . Alcohol Use: No  . Drug Use: No  . Sexual Activity: No   Other Topics Concern  . Not on file   Social History Narrative     Family History  Problem Relation Age of Onset  . Diabetes Mother   . Breast cancer Mother     Age 64's  . Stroke Mother   . Gastric cancer Father   . Diabetes Sister   . Hypertension Sister   . Breast cancer Sister     Age 41       ROS:  Please see the history of present illness.     All other systems reviewed and negative.    Physical Exam: Blood pressure 111/72, pulse 75, height 5\' 5"  (1.651 m), weight 167 lb 9.6 oz (76.023 kg). General: Well developed, well nourished female in no acute distress. Head: Normocephalic, atraumatic, sclera non-icteric, no xanthomas, nares are without discharge. EENT: normal Lymph Nodes:  none Back: without scoliosis/kyphosis, no CVA tendersness Neck: Negative for carotid bruits. JVD not elevated. Lungs: Clear bilaterally to auscultation without wheezes, rales, or rhonchi. Breathing is unlabored. Heart: RRR with S1 S2. 2/6 systolic murmur , rubs, or gallops appreciated. Abdomen: Soft, non-tender, non-distended with normoactive bowel sounds. No hepatomegaly. No rebound/guarding. No obvious abdominal masses. Msk:  Strength and tone appear normal for age. Extremities: No clubbing or cyanosis. No edema.  Distal pedal pulses are 2+ and equal bilaterally. Skin: Warm and Dry Neuro: Alert and oriented X 3. CN III-XII intact Grossly normal sensory and motor function . Psych:  Responds to questions appropriately with a normal affect.      Labs: Cardiac Enzymes No results for input(s): CKTOTAL, CKMB, TROPONINI in the last 72 hours. CBC Lab Results  Component Value Date   WBC 5.4 09/16/2013   HGB 12.1 09/16/2013   HCT 35.7* 09/16/2013   MCV 88.6 09/16/2013   PLT 195 09/16/2013   PROTIME: No results for input(s): LABPROT, INR in the last 72 hours. Chemistry No results for input(s): NA, K, CL, CO2, BUN, CREATININE, CALCIUM, PROT, BILITOT, ALKPHOS, ALT, AST, GLUCOSE in the last 168 hours.  Invalid input(s): LABALBU Lipids Lab Results  Component Value Date   CHOL 175 09/18/2013   HDL 52 09/18/2013   LDLCALC 108* 09/18/2013   TRIG 77 09/18/2013   BNP PRO B NATRIURETIC PEPTIDE (BNP)  Date/Time Value Ref Range Status  09/16/2013 11:28 AM 6.6 0 - 125 pg/mL Final    Miscellaneous Lab Results  Component Value Date   DDIMER 0.30 09/16/2013    Radiology/Studies:  No results found.  EKG: sinus  89  13/07/36   Assessment and Plan:  Bechets syndrome-vascuilitis  Syncope and falls  Orthostatic tachycardia ( by history)  Iatrogenic adrenal insufficiency  The patient does not meet criteria for POTS either now or in the hospital in September. I suspect, however, that she does have iatrogenic adrenal insufficiency with not only glucocorticoid deficiency but also   mineralocorticoid needs and this probably explains the improvement in her orthostatic vital signs now compared to September.  I have stressed the importance of exercise; her ability to exercise is markedly limited but it may be better recumbent or in the water as compared to vertical Exercise. We spent more than 50% of our >65 min visit in face to face counseling regarding the above   Sherryl Manges

## 2014-01-01 NOTE — Patient Instructions (Signed)
Your physician recommends that you schedule a follow-up appointment as needed  

## 2014-01-03 ENCOUNTER — Ambulatory Visit: Payer: 59 | Admitting: Cardiovascular Disease

## 2014-02-26 ENCOUNTER — Ambulatory Visit (INDEPENDENT_AMBULATORY_CARE_PROVIDER_SITE_OTHER): Payer: 59 | Admitting: Cardiovascular Disease

## 2014-02-26 VITALS — BP 114/68 | HR 84 | Ht 65.0 in | Wt 163.5 lb

## 2014-02-26 DIAGNOSIS — R002 Palpitations: Secondary | ICD-10-CM | POA: Diagnosis not present

## 2014-02-26 DIAGNOSIS — E2740 Unspecified adrenocortical insufficiency: Secondary | ICD-10-CM

## 2014-02-26 DIAGNOSIS — I951 Orthostatic hypotension: Secondary | ICD-10-CM

## 2014-02-26 NOTE — Patient Instructions (Signed)
Wean Metoprolol to 25mg  daily.  Call after a couple weeks to let Dr. know how you are feeling.  Dr. Royann Shivers recommends that you schedule a follow-up appointment in: 6 months.

## 2014-02-28 ENCOUNTER — Encounter: Payer: Self-pay | Admitting: Cardiovascular Disease

## 2014-02-28 NOTE — Progress Notes (Signed)
Patient ID: [name], female   DOB: 09-19-1960, 54 y.o.   MRN: [id number]      Reason for office visit Atypical chest discomfort, palpitations, dizziness.  [name] is doing better than at our last encounter. She has responded well to treatment with fludrocortisone for her orthostatic hypotension and tachycardia, presumed to be caused by secondary adrenal insufficiency related to steroid therapy for Behcet's disease.  She has not had any more falls or syncope.  She is convinced she has Ehlers-Danlos disease. She demonstrates joint laxity in her thumbs and shoulders and reports previous easy hip dislocation. She has some degree of skin hyperextensibility. One of her cousins had a brain aneurysm and an uncle died suddenly at age 62, but there is no known family history of aortic aneurysm/dissection or Ehlers-Danlos sd. Her ascending aorta and mitral valve appeared normal on recent echo.  She has atypical left chest discomfort that is not exertion related. She had an abnormal nuclear study last September with EF 44% and "Small area of mild reversibility involves the mid segments of the anterior and inferior septal wall". Follow up coronary angio showed normal coronaries and normal LVEF.  She saw Dr. Graciela Husbands who does not think she has POTS, but rather adrenal insufficiency. She has yet to discuss her suspicion of Ehlers-Danlos sd. With her rheumatologist.   Allergies  Allergen Reactions  . Celebrex [Celecoxib] Other (See Comments)    Bleeding/bruising including bleeding for multiple days  . Latex Shortness Of Breath and Dermatitis  . Sulfa Drugs Cross Reactors Other (See Comments)    Eyes turn red, renal failure, stomach problems (vomiting, diarrhea), vasculature collapses, migraine like symptoms  . Sulfites Other (See Comments)    Eyes turn red, renal failure, stomach problems (vomiting, diarrhea), vasculature collapses, migraine like symptoms  . Fentanyl Nausea And Vomiting  .  Loratadine Itching  . Simponi [Golimumab] Swelling    Simponi Aria- Headache, blurred vision.  Marland Kitchen Epinephrine Other (See Comments)    Tachycardia  . Erythromycin Itching and Rash    Has tolerated azithromycin  . Morphine And Related Itching and Rash    Includes codeine; can take hydrocodone    Current Outpatient Prescriptions  Medication Sig Dispense Refill  . acetaminophen (TYLENOL) 650 MG CR tablet Take 650 mg by mouth every 8 (eight) hours as needed for pain.    Marland Kitchen albuterol (PROVENTIL HFA;VENTOLIN HFA) 108 (90 BASE) MCG/ACT inhaler Inhale 1-2 puffs into the lungs every 6 (six) hours as needed for wheezing or shortness of breath.    Marland Kitchen aspirin EC 81 MG tablet Take 81 mg by mouth daily.    . B Complex-C (B-COMPLEX WITH VITAMIN C) tablet Take 1 tablet by mouth daily.    . Calcium-Magnesium-Zinc 333-133-5 MG TABS Take 1 tablet by mouth 3 (three) times daily.     . cetirizine (ZYRTEC) 10 MG tablet Take 10 mg by mouth at bedtime.    . cholecalciferol (VITAMIN D) 1000 UNITS tablet Take 1,000 Units by mouth daily.    . Coconut Oil 1000 MG CAPS Take 1,000 mg by mouth 2 (two) times daily.    . colchicine 0.6 MG tablet Take 0.6 mg by mouth 2 (two) times daily. Colcrys    . diazepam (VALIUM) 5 MG tablet Take 2.5 mg by mouth daily as needed for muscle spasms. AT BEDTIME    . DULoxetine (CYMBALTA) 30 MG capsule Take 60 mg by mouth daily.     . fludrocortisone (FLORINEF) 0.1 MG tablet Take 1  tablet (0.1 mg total) by mouth daily. 30 tablet 0  . folic acid (FOLVITE) 1 MG tablet Take 2 mg by mouth daily.     Marland Kitchen HYDROcodone-acetaminophen (NORCO) 10-325 MG per tablet Take 0.5 tablets by mouth 2 (two) times daily as needed (pain).    Marland Kitchen lansoprazole (PREVACID) 15 MG capsule Take 15 mg by mouth at bedtime.     . methotrexate 25 MG/ML injection Inject 25 mg into the skin once a week. On Wednesdays    . metoprolol succinate (TOPROL-XL) 50 MG 24 hr tablet Take 1 tablet (50 mg total) by mouth daily. 31 tablet 11    . Multiple Vitamin (MULTIVITAMIN WITH MINERALS) TABS Take 1 tablet by mouth daily.    . predniSONE (DELTASONE) 5 MG tablet Take 7.5 mg by mouth daily with breakfast.     . Propylene Glycol-Glycerin (SOOTHE OP) Apply 1 drop to eye 3 (three) times daily as needed (scratchy/blurry eyes).    . Risedronate Sodium (ATELVIA) 35 MG TBEC Take 35 mg by mouth once a week. Taken on Saturday     No current facility-administered medications for this visit.    Past Medical History  Diagnosis Date  . Endometriosis   . Endometrial polyp   . IBS (irritable bowel syndrome)   . Acid reflux   . Colitis   . Behcet's syndrome   . GERD (gastroesophageal reflux disease)   . Uveitis   . Osteopenia   . Adrenal insufficiency   . Subjective visual disturbance 07/14/2012  . Tear of lateral meniscus of right knee, current     Tear of lateral & medial meniscus of right knee  . Asthma   . PONV (postoperative nausea and vomiting)     also low blood pressure at times  . Pneumonia     20 years ago  . Refusal of blood transfusions as patient is Jehovah's Witness   . Chronic back pain   . Chronic neck pain     Past Surgical History  Procedure Laterality Date  . Cholecystectomy  12/2004  . Pelvic laparoscopy      DL  . Dilation and curettage of uterus    . Hysteroscopy    . Cesarean section  03/2001  . Knee surgery Left 05/1998  . Hand surgery    . Breast surgery      Cysts excised  . Oophorectomy      BSO  . Vaginal hysterectomy  07/2007    LAVH BSO  . Back surgery    . Knee arthroscopy with lateral menisectomy Right 12/12/2012    Procedure: KNEE ARTHROSCOPY WITH LATERAL MENISECTOMY;  Surgeon: Nilda Simmer, MD;  Location: [facility];  Service: Orthopedics;  Laterality: Right;  . Thyroid cyst excision    . Anterior fusion cervical spine  08/22/2013    c4 5  6         . Anterior cervical decomp/discectomy fusion N/A 08/22/2013    Procedure: ANTERIOR CERVICAL DECOMPRESSION/DISCECTOMY  FUSION 2 LEVELS;  Surgeon: Karn Cassis, MD;  Location: [facility];  Service: Neurosurgery;  Laterality: N/A;  C4-5 C5-6 Anterior cervical decompression/diskectomy/fusion  . Cardiac catheterization  09/18/2013  . Left heart catheterization with coronary angiogram N/A 09/18/2013    Procedure: LEFT HEART CATHETERIZATION WITH CORONARY ANGIOGRAM;  Surgeon: Micheline Chapman, MD;  [facility];  Service: Cardiovascular;  Laterality: N/A;    Family History  Problem Relation Age of Onset  . Diabetes Mother   . Breast  cancer Mother     Age 62's  . Stroke Mother   . Gastric cancer Father   . Diabetes Sister   . Hypertension Sister   . Breast cancer Sister     Age 78    History   Social History  . Marital Status: Legally Separated    Spouse Name: N/A  . Number of Children: 1  . Years of Education: N/A   Occupational History  .  [employer]   Social History Main Topics  . Smoking status: Never Smoker   . Smokeless tobacco: Never Used  . Alcohol Use: No  . Drug Use: No  . Sexual Activity: No   Other Topics Concern  . Not on file   Social History Narrative    Review of systems: The patient specifically denies any chest pain with exertion, dyspnea at rest or with exertion, orthopnea, paroxysmal nocturnal dyspnea, syncope, palpitations, focal neurological deficits, intermittent claudication, lower extremity edema, unexplained weight gain, cough, hemoptysis or wheezing.  PHYSICAL EXAM BP 114/68 mmHg  Pulse 84  Ht 5\' 5"  (1.651 m)  Wt 163 lb 8 oz (74.163 kg)  BMI 27.21 kg/m2  General: Alert, oriented x3, no distress Head: no evidence of trauma, PERRL, EOMI, no exophtalmos or lid lag, no myxedema, no xanthelasma; normal ears, nose and oropharynx Neck: normal jugular venous pulsations and no hepatojugular reflux; brisk carotid pulses without delay and no carotid bruits Chest: clear to auscultation, no signs of consolidation by percussion or palpation, normal fremitus,  symmetrical and full respiratory excursions Cardiovascular: normal position and quality of the apical impulse, regular rhythm, normal first and second heart sounds, no murmurs, rubs or gallops Abdomen: no tenderness or distention, no masses by palpation, no abnormal pulsatility or arterial bruits, normal bowel sounds, no hepatosplenomegaly Extremities: no clubbing, cyanosis or edema; 2+ radial, ulnar and brachial pulses bilaterally; 2+ right femoral, posterior tibial and dorsalis pedis pulses; 2+ left femoral, posterior tibial and dorsalis pedis pulses; no subclavian or femoral bruits Neurological: grossly nonfocal   EKG: NSR  Lipid Panel     Component Value Date/Time   CHOL 175 09/18/2013 1030   TRIG 77 09/18/2013 1030   HDL 52 09/18/2013 1030   CHOLHDL 3.4 09/18/2013 1030   VLDL 15 09/18/2013 1030   LDLCALC 108* 09/18/2013 1030    BMET    Component Value Date/Time   NA 144 09/16/2013 1128   K 3.7 09/16/2013 1128   CL 104 09/16/2013 1128   CO2 28 09/16/2013 1128   GLUCOSE 95 09/16/2013 1128   BUN 14 09/16/2013 1128   CREATININE 0.52 09/16/2013 1545   CALCIUM 9.5 09/16/2013 1128   GFRNONAA >90 09/16/2013 1545   GFRAA >90 09/16/2013 1545     ASSESSMENT AND PLAN  Atypical chest discomfort in a patient with recent normal coronary angiography and echo.  The visualized portions of her thoracic aorta were normal on both echo and LV angio.  Doubt CV cause of her discomfort.  Encouraged her to discuss the potential diagnosis of Ehlers-Danlos with her rheumatologist. If confirmed, formal CT angio of the aorta may be reasonable as a baseline, but it does not sound like she has a high risk of vascular aneurysms.  No change in current therapy. Continue the beta blocker (at her request will try to see if symptoms remain well controlled on a lower dose of metoprolol, but reviewed need to wean gradually) and fludrocortisone.  Patient Instructions  Wean Metoprolol to 25mg  daily.   Call after  a couple weeks to let Dr. Royann Shivers know how you are feeling.  Dr. Royann Shivers recommends that you schedule a follow-up appointment in: 6 months.       Orders Placed This Encounter  Procedures  . EKG 12-Lead   No orders of the defined types were placed in this encounter.    Junious Silk, MD, [facility] [phone] office [phone] pager

## 2014-03-08 ENCOUNTER — Telehealth (HOSPITAL_COMMUNITY): Payer: Self-pay | Admitting: Cardiovascular Disease

## 2014-03-08 NOTE — Telephone Encounter (Signed)
Received [employer] for Records and Attending Physician Statement to be signed by Dr Royann Shivers.  Contacted patient and she wanted Release and form mailed to her and she would take to [facility] and drop off.  Advised her that would mail her the Release of Information Form and the Attending Physician Statement today.

## 2014-03-09 ENCOUNTER — Encounter: Payer: Self-pay | Admitting: Cardiovascular Disease

## 2014-03-12 ENCOUNTER — Telehealth: Payer: Self-pay | Admitting: *Deleted

## 2014-03-12 NOTE — Telephone Encounter (Signed)
EF on echo was 60-65% and Nuc study EF was 44%.  Informed patient Dr. Salena Saner will review and get back to her e-mail but is in meetings this week.  Patient voiced understanding.

## 2014-04-03 ENCOUNTER — Other Ambulatory Visit (HOSPITAL_COMMUNITY): Payer: Self-pay | Admitting: Orthopedic Surgery

## 2014-04-03 DIAGNOSIS — R102 Pelvic and perineal pain: Secondary | ICD-10-CM

## 2014-04-05 ENCOUNTER — Ambulatory Visit (HOSPITAL_COMMUNITY)
Admission: RE | Admit: 2014-04-05 | Discharge: 2014-04-05 | Disposition: A | Discharge status: Home / Self Care | Payer: 59 | Source: Ambulatory Visit | Attending: Orthopedic Surgery | Admitting: Orthopedic Surgery

## 2014-04-05 DIAGNOSIS — R102 Pelvic and perineal pain: Secondary | ICD-10-CM | POA: Insufficient documentation

## 2014-04-10 ENCOUNTER — Telehealth: Payer: Self-pay | Admitting: Cardiovascular Disease

## 2014-04-10 ENCOUNTER — Telehealth: Payer: Self-pay | Admitting: *Deleted

## 2014-04-10 NOTE — Telephone Encounter (Signed)
Received [employer] disability papers from medical records today.  Requesting form be filled out and signed by Dr. Salena Saner.  Review of records cardiac testing normal.  Per patient she is out on disability but not from a cardiac standpoint but all of her doctors need to fill out forms for their specialty.  Informed patient I will have Dr. Salena Saner fill out and sign today and give back to MR to fax to [facility].  Patient voiced understanding.

## 2014-04-10 NOTE — Telephone Encounter (Signed)
Received [employer] Attending Physicians Statement from [facility] for Dr Royann Shivers to review and sign.  Given to B Lassiter for Dr Royann Shivers to review, sign and return to me.

## 2014-04-10 NOTE — Telephone Encounter (Signed)
Dr. Salena Saner filled out and signed [employer] form and given back to [place] in [employer].

## 2014-04-16 ENCOUNTER — Telehealth: Payer: Self-pay | Admitting: Cardiovascular Disease

## 2014-04-16 NOTE — Telephone Encounter (Signed)
Received signed Attending Physicians Statement [facility]) from Dr Royann Shivers on 04/11/14.  Faxed to [employer] on 04/11/14 and notified patient.  Mailed copy to patient on 04/16/14. lp

## 2014-04-19 ENCOUNTER — Other Ambulatory Visit: Payer: Self-pay

## 2014-04-19 DIAGNOSIS — Z1231 Encounter for screening mammogram for malignant neoplasm of breast: Secondary | ICD-10-CM

## 2014-05-02 ENCOUNTER — Ambulatory Visit
Admission: RE | Admit: 2014-05-02 | Discharge: 2014-05-02 | Disposition: A | Discharge status: Home / Self Care | Payer: 59 | Source: Ambulatory Visit

## 2014-05-02 DIAGNOSIS — Z1231 Encounter for screening mammogram for malignant neoplasm of breast: Secondary | ICD-10-CM

## 2014-05-21 ENCOUNTER — Ambulatory Visit: Payer: 59 | Attending: Physical Medicine and Rehabilitation | Admitting: Physical Therapy

## 2014-05-21 DIAGNOSIS — M25552 Pain in left hip: Secondary | ICD-10-CM | POA: Insufficient documentation

## 2014-05-21 DIAGNOSIS — R262 Difficulty in walking, not elsewhere classified: Secondary | ICD-10-CM | POA: Insufficient documentation

## 2014-05-21 DIAGNOSIS — M2480 Other specific joint derangements of unspecified joint, not elsewhere classified: Secondary | ICD-10-CM | POA: Diagnosis not present

## 2014-05-21 DIAGNOSIS — M25359 Other instability, unspecified hip: Secondary | ICD-10-CM

## 2014-05-21 NOTE — Therapy (Signed)
[place], [place], [address] Phone: [phone]   Fax:  [phone]  Physical Therapy Evaluation  Patient Details  Name: [name] MRN: [id number] Date of Birth: April 19, 1960 Referring Provider:  Laroy Apple, MD  Encounter Date: 05/21/2014      PT End of Session - 05/21/14 1311    Visit Number 1   Number of Visits 16   Date for PT Re-Evaluation 07/16/14   PT Start Time 2500   PT Stop Time 3704   PT Time Calculation (min) 47 min   Equipment Utilized During Treatment Other (comment)  SI belt   Behavior During Therapy [facility] for tasks assessed/performed      Past Medical History  Diagnosis Date  . Endometriosis   . Endometrial polyp   . IBS (irritable bowel syndrome)   . Acid reflux   . Colitis   . Behcet's syndrome   . GERD (gastroesophageal reflux disease)   . Uveitis   . Osteopenia   . Adrenal insufficiency   . Subjective visual disturbance 07/14/2012  . Tear of lateral meniscus of right knee, current     Tear of lateral & medial meniscus of right knee  . Asthma   . PONV (postoperative nausea and vomiting)     also low blood pressure at times  . Pneumonia     20 years ago  . Refusal of blood transfusions as patient is Jehovah's Witness   . Chronic back pain   . Chronic neck pain     Past Surgical History  Procedure Laterality Date  . Cholecystectomy  12/2004  . Pelvic laparoscopy      DL  . Dilation and curettage of uterus    . Hysteroscopy    . Cesarean section  03/2001  . Knee surgery Left 05/1998  . Hand surgery    . Breast surgery      Cysts excised  . Oophorectomy      BSO  . Vaginal hysterectomy  07/2007    LAVH BSO  . Back surgery    . Knee arthroscopy with lateral menisectomy Right 12/12/2012    Procedure: KNEE ARTHROSCOPY WITH LATERAL MENISECTOMY;  Surgeon: Lorn Junes, MD;  Location: [facility];  Service: Orthopedics;  Laterality: Right;   . Thyroid cyst excision    . Anterior fusion cervical spine  08/22/2013    c4 5  6         . Anterior cervical decomp/discectomy fusion N/A 08/22/2013    Procedure: ANTERIOR CERVICAL DECOMPRESSION/DISCECTOMY FUSION 2 LEVELS;  Surgeon: Floyce Stakes, MD;  Location: [facility];  Service: Neurosurgery;  Laterality: N/A;  C4-5 C5-6 Anterior cervical decompression/diskectomy/fusion  . Cardiac catheterization  09/18/2013  . Left heart catheterization with coronary angiogram N/A 09/18/2013    Procedure: LEFT HEART CATHETERIZATION WITH CORONARY ANGIOGRAM;  Surgeon: Blane Ohara, MD;  Location: [facility] CATH LAB;  Service: Cardiovascular;  Laterality: N/A;    There were no vitals filed for this visit.  Visit Diagnosis:  Pelvic joint pain, left  Difficulty in walking involving pelvis  Instability of pelvis or thigh joint      Subjective Assessment - 05/21/14 1151    Subjective Patient presents with pain in L sided back/hip pain. Clicking- locking.  March 21 walked at [facility] and then could not walk 2 days post.  MRI done. Ibazebo with spinal injection helped 04/27/14.    Pertinent History questionable MI/syncope, Behcet's syndrome, L Fusion, C  fusion, bilateral knee surgery   Limitations Sitting;Standing;Walking;House hold activities   How long can you sit comfortably? Can sit but not equally through both hips   How long can you stand comfortably? Not at all    How long can you walk comfortably? Not at all, uses 1 crutch   Diagnostic tests MRI Pelvis Mild sacroiliac degenerative changes, neg. overall done 04/05/14   Currently in Pain? Yes   Pain Score 9   never below a 3/10 (best)   Pain Location Hip  groin, post pelvis and ischial tub.   Pain Orientation Left   Pain Descriptors / Indicators Throbbing;Aching   Pain Type Chronic pain;Acute pain   Pain Radiating Towards post thigh and calf    Pain Onset More than a month ago   Pain Frequency Constant   Aggravating Factors  Weight bearing    Pain Relieving Factors lying on L side, decr WB in sit, stand, walk, ice helps, wearing lumbar brace, Tylenol not really helping   Multiple Pain Sites No            [facility] PT Assessment - 05/21/14 1158    Assessment   Medical Diagnosis L SIJ pain, pelvic instability   Onset Date 03/26/14   Next MD Visit 06/18/14   Prior Therapy Yes, lumbar and cervical   Precautions   Precautions None   Restrictions   Weight Bearing Restrictions No   Other Position/Activity Restrictions self-restricting weight bearing   Balance Screen   Has the patient fallen in the past 6 months Yes   How many times? 2 (has not fallen since Jan)   Has the patient had a decrease in activity level because of a fear of falling?  Yes  I fall all the time   Is the patient reluctant to leave their home because of a fear of falling?  No   Home Environment   Living Enviornment Private residence   Living Arrangements Children   Type of [place] Access Level entry   Home Layout Two level   Alternate Level Stairs-Number of Steps 12   Alternate Level Stairs-Rails Left   [place]   Additional Comments stays on 1st level now that LLE is so painful    Prior Function   Level of [place] Needs assistance with homemaking   Vacuuming Total   Light Housekeeping Minimal   Vocation Other (comment)   Observation/Other Assessments   Focus on Therapeutic Outcomes (FOTO)  69%  goal 46%   Sensation   Additional Comments due to circulation problems, hands and feet go numb   Posture/Postural Control   Posture/Postural Control Postural limitations   Postural Limitations Left pelvic obliquity   Posture Comments L post ilium   AROM   Lumbar Flexion touches floor   Lumbar Extension WNL   Lumbar - Right Side Bend WNL   Lumbar - Left Side Bend WNL  pain on L   Lumbar - Right Rotation WNL   Lumbar - Left Rotation WNL   PROM   Right/Left Hip --  WNL   Strength   Right Hip Flexion 4+/5   Right Hip  Extension 4/5   Right Hip ABduction 4+/5   Left Hip Flexion 4/5   Left Hip Extension 2+/5   Left Hip ABduction 3-/5   Right Knee Flexion 5/5   Right Knee Extension 5/5   Left Knee Flexion 4/5   Left Knee Extension 4+/5   Flexibility   Hamstrings 90 bilat.  Palpation   Palpation pain L ischial tuberosity and more ventral than Rt.  sore in gluteals. Pain L lateral SI border.     Pelvic Compression   Findings Positive   Side Left   Gaenslen's test   Findings Positive   Side  Left           PT Education - 05/21/14 1311    Education provided Yes   Education Details PT/POC, HEP/MET for post ilium   Person(s) Educated Patient   Methods Explanation;Demonstration;Handout   Comprehension Verbalized understanding;Returned demonstration;Need further instruction          PT Short Term Goals - 05/21/14 1322    PT SHORT TERM GOAL #1   Title Pt will be able to walk with 50% weightbearing on LLE and LRAD if needed    Time 4   Period Weeks   Status New   PT SHORT TERM GOAL #2   Title Pt will be I with initial HEP   Time 4   Period Weeks   Status New   PT SHORT TERM GOAL #3   Title Pt will be able to sit for 15-20 min with equal weighbearing for meals, appts.    Time 4   Period Weeks   Status New   PT SHORT TERM GOAL #4   Title Pt will be I with concepts fo RICE, posture and body mechanics   Time 4   Period Weeks   Status New           PT Long Term Goals - 05/21/14 1326    PT LONG TERM GOAL #1   Title Pt will be I with advanced HEP   Time 8   Period Weeks   Status New   PT LONG TERM GOAL #2   Title Pt will be able to walk without LRAD and occ min pain   Time 8   Status New   PT LONG TERM GOAL #3   Title Pt wil be able to do light housework with occ min pain   Time 8   Period Weeks   Status New   PT LONG TERM GOAL #4   Title Pt will be score 50% or less to demo functional improvement.    Time 8   Period Weeks   Status New   PT LONG TERM GOAL #5    Title Pt will be able to sit with equal WB and min pain as needed for driving, meals   Time 8   Period Weeks   Status New               Plan - 05/21/14 1315    Clinical Impression Statement This patient presents as familiar to me with new onset of L pelvic pain.  She presents with signs and symptoms consistent with SIJ instability.  Pt with chronic condition related to autoimmune diseases,she is being worked up for [employer] as possible source of pain, joint inflammation ([facility]).  She responded to manual therapy, experienced relief of symptoms with sacral compression and soft tissue to L hip rotators.  She will benefit from skilled PT to improve her and prevent further disability (especially walking)   Pt will benefit from skilled therapeutic intervention in order to improve on the following deficits Abnormal gait;Decreased range of motion;Difficulty walking;Postural dysfunction;Decreased endurance;Decreased activity tolerance;Increased fascial restricitons;Pain;Decreased balance;Decreased mobility;Decreased strength   Rehab Potential Good   PT Frequency 2x / week   PT Duration 8 weeks   PT Treatment/Interventions  ADLs/Self Care Home Management;Moist Heat;Therapeutic activities;Patient/family education;Passive range of motion;Therapeutic exercise;DME Instruction;Ultrasound;Gait training;Balance training;Manual techniques;Dry needling;Neuromuscular re-education;Cryotherapy;Electrical Stimulation;Functional mobility training;Other (comment)  Pilates based PT for stability of core/pelvis   PT Next Visit Plan check MET for ilial rotation.  Focus on symmetry, then stabilization. modailities, manual as needed   PT Home Exercise Plan resisted hip flexion   Consulted and Agree with Plan of Care Patient         Problem List Patient Active Problem List   Diagnosis Date Noted  . Orthostatic hypotension 12/07/2013  . Adrenal insufficiency 09/25/2013  . Palpitations 09/16/2013  . Cervical  stenosis of spinal canal 08/22/2013  . Lumbar degenerative disc disease 12/30/2012  . Tear of lateral meniscus of right knee, current   . Osteopenia   . Lumbar back pain 09/13/2012  . Subjective visual disturbance 07/14/2012  . Endometriosis   . Endometrial polyp   . IBS (irritable bowel syndrome)   . Acid reflux   . Colitis   . Behcet's syndrome   . Glucose intolerance (impaired glucose tolerance) 12/19/2010  . Behcet's disease 01/06/2004    Dhrithi Riche 05/21/2014, 3:24 PM  [facility] [address] [place], [place], [address] Phone: [phone]   Fax:  [phone]  Raeford Razor, PT 05/21/2014 3:25 PM Phone: [phone] Fax: [phone]

## 2014-05-23 ENCOUNTER — Ambulatory Visit: Payer: 59 | Admitting: Physical Therapy

## 2014-05-23 DIAGNOSIS — R262 Difficulty in walking, not elsewhere classified: Secondary | ICD-10-CM

## 2014-05-23 DIAGNOSIS — M25552 Pain in left hip: Secondary | ICD-10-CM | POA: Diagnosis not present

## 2014-05-23 DIAGNOSIS — M25359 Other instability, unspecified hip: Secondary | ICD-10-CM

## 2014-05-23 NOTE — Therapy (Signed)
[place], [place], [address] Phone: [phone]   Fax:  [phone]  Physical Therapy Treatment  Patient Details  Name: [name] MRN: [id number] Date of Birth: [birth year] Referring Provider:  Foye Spurling, MD  Encounter Date: 05/23/2014      PT End of Session - 05/23/14 1435    Visit Number 2   Number of Visits 16   Date for PT Re-Evaluation 07/16/14   PT Start Time 0215   PT Stop Time 0315   PT Time Calculation (min) 60 min      Past Medical History  Diagnosis Date  . Endometriosis   . Endometrial polyp   . IBS (irritable bowel syndrome)   . Acid reflux   . Colitis   . Behcet's syndrome   . GERD (gastroesophageal reflux disease)   . Uveitis   . Osteopenia   . Adrenal insufficiency   . Subjective visual disturbance 07/14/2012  . Tear of lateral meniscus of right knee, current     Tear of lateral & medial meniscus of right knee  . Asthma   . PONV (postoperative nausea and vomiting)     also low blood pressure at times  . Pneumonia     20 years ago  . Refusal of blood transfusions as patient is Jehovah's Witness   . Chronic back pain   . Chronic neck pain     Past Surgical History  Procedure Laterality Date  . Cholecystectomy  12/2004  . Pelvic laparoscopy      DL  . Dilation and curettage of uterus    . Hysteroscopy    . Cesarean section  03/2001  . Knee surgery Left 05/1998  . Hand surgery    . Breast surgery      Cysts excised  . Oophorectomy      BSO  . Vaginal hysterectomy  07/2007    LAVH BSO  . Back surgery    . Knee arthroscopy with lateral menisectomy Right 12/12/2012    Procedure: KNEE ARTHROSCOPY WITH LATERAL MENISECTOMY;  Surgeon: Lorn Junes, MD;  Location: [facility];  Service: Orthopedics;  Laterality: Right;  . Thyroid cyst excision    . Anterior fusion cervical spine  08/22/2013    c4 5  6         . Anterior cervical  decomp/discectomy fusion N/A 08/22/2013    Procedure: ANTERIOR CERVICAL DECOMPRESSION/DISCECTOMY FUSION 2 LEVELS;  Surgeon: Floyce Stakes, MD;  Location: [facility];  Service: Neurosurgery;  Laterality: N/A;  C4-5 C5-6 Anterior cervical decompression/diskectomy/fusion  . Cardiac catheterization  09/18/2013  . Left heart catheterization with coronary angiogram N/A 09/18/2013    Procedure: LEFT HEART CATHETERIZATION WITH CORONARY ANGIOGRAM;  Surgeon: Blane Ohara, MD;  Location: [facility] CATH LAB;  Service: Cardiovascular;  Laterality: N/A;    There were no vitals filed for this visit.  Visit Diagnosis:  Pelvic joint pain, left  Difficulty in walking involving pelvis  Instability of pelvis or thigh joint      Subjective Assessment - 05/23/14 1637    Subjective Im making it. I've been doing the muscle energy technique. It has not made a difference so far.   Currently in Pain? Yes   Pain Score 8    Pain Location Pelvis   Pain Orientation Left;Posterior   Aggravating Factors  weight bearing left  [facility] Adult PT Treatment/Exercise - 05/23/14 1526    Lumbar Exercises: Prone   Other Prone Lumbar Exercises Multifidus activation with Tra recruitment prone with hamsting curls x 10 each , hip ext x 10 each, donkey kicks x 10 right, unable on left   Modalities   Modalities Cryotherapy;Electrical Stimulation   Cryotherapy   Number Minutes Cryotherapy 15 Minutes   Cryotherapy Location Lumbar Spine   Type of Cryotherapy Ice pack   Electrical Stimulation   Electrical Stimulation Location Left SI. Left glute   Electrical Stimulation Action IFC   Electrical Stimulation Parameters 20   Electrical Stimulation Goals Pain   Manual Therapy   Manual Therapy Soft tissue mobilization;Muscle Energy Technique   Soft tissue mobilization left piriformis and deep rotator active and passive release maintaining pressure with Hip ER/IR in prone, cues for brreathing a  relaxation.    Muscle Energy Technique MET prone with right foot on floor and left thign on table 5 sec x 10 reps with patient feeling no change upon standing.                   PT Short Term Goals - 05/21/14 1322    PT SHORT TERM GOAL #1   Title Pt will be able to walk with 50% weightbearing on LLE and LRAD if needed    Time 4   Period Weeks   Status New   PT SHORT TERM GOAL #2   Title Pt will be I with initial HEP   Time 4   Period Weeks   Status New   PT SHORT TERM GOAL #3   Title Pt will be able to sit for 15-20 min with equal weighbearing for meals, appts.    Time 4   Period Weeks   Status New   PT SHORT TERM GOAL #4   Title Pt will be I with concepts fo RICE, posture and body mechanics   Time 4   Period Weeks   Status New           PT Long Term Goals - 05/21/14 1326    PT LONG TERM GOAL #1   Title Pt will be I with advanced HEP   Time 8   Period Weeks   Status New   PT LONG TERM GOAL #2   Title Pt will be able to walk without LRAD and occ min pain   Time 8   Status New   PT LONG TERM GOAL #3   Title Pt wil be able to do light housework with occ min pain   Time 8   Period Weeks   Status New   PT LONG TERM GOAL #4   Title Pt will be score 50% or less to demo functional improvement.    Time 8   Period Weeks   Status New   PT LONG TERM GOAL #5   Title Pt will be able to sit with equal WB and min pain as needed for driving, meals   Time 8   Period Weeks   Status New               Plan - 05/23/14 1435    Clinical Impression Statement Patient reports no change with MET HEP for posteriolr ilium. She reports resolved left leg pain and reduced Left SI pain to 4/10 after sacral compressions middle and left lateral side as well as piriformis release to left piriformis in prone with passive hip IR/ER  She reported 0/10 pain  at end of treatment and was able to bear weight as she exited the clinic. She also reported very minimal return of leg pai  at lateral thigh.    PT Next Visit Plan Continue MET trials to adjust pelvis, continue manual for piriformis release, IFC and ICE        Problem List Patient Active Problem List   Diagnosis Date Noted  . Orthostatic hypotension 12/07/2013  . Adrenal insufficiency 09/25/2013  . Palpitations 09/16/2013  . Cervical stenosis of spinal canal 08/22/2013  . Lumbar degenerative disc disease 12/30/2012  . Tear of lateral meniscus of right knee, current   . Osteopenia   . Lumbar back pain 09/13/2012  . Subjective visual disturbance 07/14/2012  . Endometriosis   . Endometrial polyp   . IBS (irritable bowel syndrome)   . Acid reflux   . Colitis   . Behcet's syndrome   . Glucose intolerance (impaired glucose tolerance) 12/19/2010  . Behcet's disease 01/06/2004    Dorene Ar, [place] 05/23/2014, 4:40 PM  [facility] [address] [place], [place], [address] Phone: [phone]   Fax:  [phone]

## 2014-05-24 ENCOUNTER — Other Ambulatory Visit (HOSPITAL_COMMUNITY): Payer: Self-pay | Admitting: Internal Medicine

## 2014-05-29 ENCOUNTER — Ambulatory Visit: Payer: 59 | Admitting: Physical Therapy

## 2014-05-29 DIAGNOSIS — M25552 Pain in left hip: Secondary | ICD-10-CM

## 2014-05-29 DIAGNOSIS — R262 Difficulty in walking, not elsewhere classified: Secondary | ICD-10-CM

## 2014-05-29 DIAGNOSIS — M25359 Other instability, unspecified hip: Secondary | ICD-10-CM

## 2014-05-29 NOTE — Therapy (Signed)
[facility] [address] [place], [place], [address] Phone: [phone]   Fax:  [phone]  Physical Therapy Treatment  Patient Details  Name: [name] MRN: [id number] Date of Birth: June 25, 1960 Referring Provider:  Laurena Slimmer, MD  Encounter Date: 05/29/2014      PT End of Session - 05/29/14 1214    Visit Number 3   Number of Visits 16   Date for PT Re-Evaluation 07/16/14   PT Start Time 1018   PT Stop Time 1115   PT Time Calculation (min) 57 min      Past Medical History  Diagnosis Date  . Endometriosis   . Endometrial polyp   . IBS (irritable bowel syndrome)   . Acid reflux   . Colitis   . Behcet's syndrome   . GERD (gastroesophageal reflux disease)   . Uveitis   . Osteopenia   . Adrenal insufficiency   . Subjective visual disturbance 07/14/2012  . Tear of lateral meniscus of right knee, current     Tear of lateral & medial meniscus of right knee  . Asthma   . PONV (postoperative nausea and vomiting)     also low blood pressure at times  . Pneumonia     20 years ago  . Refusal of blood transfusions as patient is Jehovah's Witness   . Chronic back pain   . Chronic neck pain     Past Surgical History  Procedure Laterality Date  . Cholecystectomy  12/2004  . Pelvic laparoscopy      DL  . Dilation and curettage of uterus    . Hysteroscopy    . Cesarean section  03/2001  . Knee surgery Left 05/1998  . Hand surgery    . Breast surgery      Cysts excised  . Oophorectomy      BSO  . Vaginal hysterectomy  07/2007    LAVH BSO  . Back surgery    . Knee arthroscopy with lateral menisectomy Right 12/12/2012    Procedure: KNEE ARTHROSCOPY WITH LATERAL MENISECTOMY;  Surgeon: Nilda Simmer, MD;  Location: [facility];  Service: Orthopedics;  Laterality: Right;  . Thyroid cyst excision    . Anterior fusion cervical spine  08/22/2013    c4 5  6         . Anterior cervical  decomp/discectomy fusion N/A 08/22/2013    Procedure: ANTERIOR CERVICAL DECOMPRESSION/DISCECTOMY FUSION 2 LEVELS;  Surgeon: Karn Cassis, MD;  Location: [facility];  Service: Neurosurgery;  Laterality: N/A;  C4-5 C5-6 Anterior cervical decompression/diskectomy/fusion  . Cardiac catheterization  09/18/2013  . Left heart catheterization with coronary angiogram N/A 09/18/2013    Procedure: LEFT HEART CATHETERIZATION WITH CORONARY ANGIOGRAM;  Surgeon: Micheline Chapman, MD;  Location: [facility] CATH LAB;  Service: Cardiovascular;  Laterality: N/A;    There were no vitals filed for this visit.  Visit Diagnosis:  Pelvic joint pain, left  Difficulty in walking involving pelvis  Instability of pelvis or thigh joint      Subjective Assessment - 05/29/14 1020    Subjective walking left buttock /low back 6/10, sitting and driving 9/79 after 10 minutes. I laid down at home after last visit and rolled over in bed. There was a loud pop and now i can bear weight without feeling like something is out of place. It is now more tender and I can bear most of my weight.  [facility] Adult PT Treatment/Exercise - 05/29/14 0001    Lumbar Exercises: Standing   Other Standing Lumbar Exercises standing hip abduction x 10, hip extension x 10 with increased pain on right side, better with crutch or reducing weight at counter or bed.    Lumbar Exercises: Supine   Other Supine Lumbar Exercises ball squeeze with Tra contract   Lumbar Exercises: Sidelying   Clam 10 reps;20 reps   Lumbar Exercises: Prone   Straight Leg Raise --  attempts at lifting into extension or donkey kicks    Other Prone Lumbar Exercises glut squeeze x 10, heel squeeze x 10, AROM IR/ER x 10   Cryotherapy   Number Minutes Cryotherapy 15 Minutes   Cryotherapy Location Lumbar Spine   Type of Cryotherapy Ice pack   Electrical Stimulation   Electrical Stimulation Location Left SI. Left glute   Electrical  Stimulation Action IFC   Electrical Stimulation Parameters 27   Electrical Stimulation Goals Pain   Manual Therapy   Manual Therapy Soft tissue mobilization;Muscle Energy Technique   Soft tissue mobilization left piriformis, deep rotators prone  with PROM IR /ER    Pilates Reformer used for LE/core strength, postural strength, lumbopelvic disassociation and core control.  Exercises included: Footwork: 2 red for parallel on heels and toes x 10 each, 1 red for LLE only x 10 each;1 red and 1 yellow for ER x 10 bilateral Cues for Tra recruitment and neutral spine.           PT Short Term Goals - 05/21/14 1322    PT SHORT TERM GOAL #1   Title Pt will be able to walk with 50% weightbearing on LLE and LRAD if needed    Time 4   Period Weeks   Status New   PT SHORT TERM GOAL #2   Title Pt will be I with initial HEP   Time 4   Period Weeks   Status New   PT SHORT TERM GOAL #3   Title Pt will be able to sit for 15-20 min with equal weighbearing for meals, appts.    Time 4   Period Weeks   Status New   PT SHORT TERM GOAL #4   Title Pt will be I with concepts fo RICE, posture and body mechanics   Time 4   Period Weeks   Status New           PT Long Term Goals - 05/21/14 1326    PT LONG TERM GOAL #1   Title Pt will be I with advanced HEP   Time 8   Period Weeks   Status New   PT LONG TERM GOAL #2   Title Pt will be able to walk without LRAD and occ min pain   Time 8   Status New   PT LONG TERM GOAL #3   Title Pt wil be able to do light housework with occ min pain   Time 8   Period Weeks   Status New   PT LONG TERM GOAL #4   Title Pt will be score 50% or less to demo functional improvement.    Time 8   Period Weeks   Status New   PT LONG TERM GOAL #5   Title Pt will be able to sit with equal WB and min pain as needed for driving, meals   Time 8   Period Weeks   Status New  Plan - 05/29/14 1208    Clinical Impression Statement Pt reports  pain reduced after pelvis popped into place at home after last visit. Improved ability to bear weight on left LE. Continue weakness and tenderness left hip. Difficulty with gravity resisted exercises as well as standing hip AROm exercises due to increased pain on right side. Trial of reformer with pt able to perform single and double leg foot work without increased pain. Added standing hip AROm to HEP and recommended pt perform while bearing weight through crutch or UEs to decrease WB on right side.    PT Next Visit Plan stabilization and hip strength, IFC/ice, manual to left glute        Problem List Patient Active Problem List   Diagnosis Date Noted  . Orthostatic hypotension 12/07/2013  . Adrenal insufficiency 09/25/2013  . Palpitations 09/16/2013  . Cervical stenosis of spinal canal 08/22/2013  . Lumbar degenerative disc disease 12/30/2012  . Tear of lateral meniscus of right knee, current   . Osteopenia   . Lumbar back pain 09/13/2012  . Subjective visual disturbance 07/14/2012  . Endometriosis   . Endometrial polyp   . IBS (irritable bowel syndrome)   . Acid reflux   . Colitis   . Behcet's syndrome   . Glucose intolerance (impaired glucose tolerance) 12/19/2010  . Behcet's disease 01/06/2004    Sherrie Mustache, PTA 05/29/2014, 1:55 PM  [facility] [address] [place], [place], [address] Phone: [phone]   Fax:  [phone]

## 2014-05-31 ENCOUNTER — Ambulatory Visit: Payer: 59 | Admitting: Physical Therapy

## 2014-05-31 DIAGNOSIS — M25359 Other instability, unspecified hip: Secondary | ICD-10-CM

## 2014-05-31 DIAGNOSIS — M25552 Pain in left hip: Secondary | ICD-10-CM | POA: Diagnosis not present

## 2014-05-31 DIAGNOSIS — R262 Difficulty in walking, not elsewhere classified: Secondary | ICD-10-CM

## 2014-05-31 NOTE — Patient Instructions (Signed)
Trigger Point Dry Needling  . What is Trigger Point Dry Needling (DN)? o DN is a physical therapy technique used to treat muscle pain and dysfunction. Specifically, DN helps deactivate muscle trigger points (muscle knots).  o A thin filiform needle is used to penetrate the skin and stimulate the underlying trigger point. The goal is for a local twitch response (LTR) to occur and for the trigger point to relax. No medication of any kind is injected during the procedure.   . What Does Trigger Point Dry Needling Feel Like?  o The procedure feels different for each individual patient. Some patients report that they do not actually feel the needle enter the skin and overall the process is not painful. Very mild bleeding may occur. However, many patients feel a deep cramping in the muscle in which the needle was inserted. This is the local twitch response.   Marland Kitchen How Will I feel after the treatment? o Soreness is normal, and the onset of soreness may not occur for a few hours. Typically this soreness does not last longer than two days.  o Bruising is uncommon, however; ice can be used to decrease any possible bruising.  o In rare cases feeling tired or nauseous after the treatment is normal. In addition, your symptoms may get worse before they get better, this period will typically not last longer than 24 hours.   . What Can I do After My Treatment? o Increase your hydration by drinking more water for the next 24 hours. o You may place ice or heat on the areas treated that have become sore, however, do not use heat on inflamed or bruised areas. Heat often brings more relief post needling. o You can continue your regular activities, but vigorous activity is not recommended initially after the treatment for 24 hours. o DN is best combined with other physical therapy such as strengthening, stretching, and other therapies.  Garen Lah, PT 05/31/2014 9:48 AM Phone: [phone] Fax: [phone]

## 2014-05-31 NOTE — Therapy (Signed)
[facility] [address] [place], [place], [address] Phone: [phone]   Fax:  [phone]  Physical Therapy Treatment  Patient Details  Name: [name] MRN: [id number] Date of Birth: 1960/10/09 Referring Provider:  Laurena Slimmer, MD  Encounter Date: 05/31/2014      PT End of Session - 05/31/14 1126    Visit Number 4   Number of Visits 16   Date for PT Re-Evaluation 07/16/14   PT Start Time 0932   PT Stop Time 1030   PT Time Calculation (min) 58 min   Activity Tolerance Patient tolerated treatment well  limited by weakness   Behavior During Therapy [facility] for tasks assessed/performed      Past Medical History  Diagnosis Date  . Endometriosis   . Endometrial polyp   . IBS (irritable bowel syndrome)   . Acid reflux   . Colitis   . Behcet's syndrome   . GERD (gastroesophageal reflux disease)   . Uveitis   . Osteopenia   . Adrenal insufficiency   . Subjective visual disturbance 07/14/2012  . Tear of lateral meniscus of right knee, current     Tear of lateral & medial meniscus of right knee  . Asthma   . PONV (postoperative nausea and vomiting)     also low blood pressure at times  . Pneumonia     20 years ago  . Refusal of blood transfusions as patient is Jehovah's Witness   . Chronic back pain   . Chronic neck pain     Past Surgical History  Procedure Laterality Date  . Cholecystectomy  12/2004  . Pelvic laparoscopy      DL  . Dilation and curettage of uterus    . Hysteroscopy    . Cesarean section  03/2001  . Knee surgery Left 05/1998  . Hand surgery    . Breast surgery      Cysts excised  . Oophorectomy      BSO  . Vaginal hysterectomy  07/2007    LAVH BSO  . Back surgery    . Knee arthroscopy with lateral menisectomy Right 12/12/2012    Procedure: KNEE ARTHROSCOPY WITH LATERAL MENISECTOMY;  Surgeon: Nilda Simmer, MD;  Location: [facility];  Service: Orthopedics;   Laterality: Right;  . Thyroid cyst excision    . Anterior fusion cervical spine  08/22/2013    c4 5  6         . Anterior cervical decomp/discectomy fusion N/A 08/22/2013    Procedure: ANTERIOR CERVICAL DECOMPRESSION/DISCECTOMY FUSION 2 LEVELS;  Surgeon: Karn Cassis, MD;  Location: [facility];  Service: Neurosurgery;  Laterality: N/A;  C4-5 C5-6 Anterior cervical decompression/diskectomy/fusion  . Cardiac catheterization  09/18/2013  . Left heart catheterization with coronary angiogram N/A 09/18/2013    Procedure: LEFT HEART CATHETERIZATION WITH CORONARY ANGIOGRAM;  Surgeon: Micheline Chapman, MD;  Location: [facility] CATH LAB;  Service: Cardiovascular;  Laterality: N/A;    There were no vitals filed for this visit.  Visit Diagnosis:  Pelvic joint pain, left  Difficulty in walking involving pelvis  Instability of pelvis or thigh joint      Subjective Assessment - 05/31/14 0938    Subjective My left buttock is hurting 8/10 . sitting is the worse and it is wt bearing.  I am unable to sit on my left buttock . I modify by sitting.  I will start Remicade on June 8.    Pertinent History  questionable MI/syncope, Behcet's syndrome, L Fusion, C fusion, bilateral knee surgery   How long can you walk comfortably? Not at all, uses 1 crutch   Diagnostic tests MRI Pelvis Mild sacroiliac degenerative changes, neg. overall done 04/05/14   Currently in Pain? Yes   Pain Score 8    Pain Location Pelvis   Pain Orientation Left;Posterior   Pain Descriptors / Indicators Throbbing;Aching   Pain Type Chronic pain;Acute pain   Aggravating Factors  weight bears on left in sitting and standing.                         [facility] Adult PT Treatment/Exercise - 05/31/14 0942    Lumbar Exercises: Supine   Bridge 5 reps  10 sec x2 with rest inbetween 5 reps   Bridge Limitations no pain   Lumbar Exercises: Prone   Other Prone Lumbar Exercises glut squeeze x 10, heel squeeze x 10, AROM IR/ER x 10    Other Prone Lumbar Exercises eccentric prone hamstring curls and hip extension with PT aarom x 10 each   Moist Heat Therapy   Number Minutes Moist Heat 15 Minutes   Moist Heat Location --  Left  hip   Electrical Stimulation   Electrical Stimulation Location Left SI. Left glute   Electrical Stimulation Action IFC   Electrical Stimulation Parameters 27   Electrical Stimulation Goals Pain   Manual Therapy   Manual Therapy Soft tissue mobilization   Soft tissue mobilization left piriformis, deep rotators prone  with PROM IR /ER          Trigger Point Dry Needling - 05/31/14 0945    Consent Given? Yes   Education Handout Provided Yes   Muscles Treated Upper Body --   Muscles Treated Lower Body Gluteus minimus;Gluteus maximus;Piriformis   Gluteus Maximus Response Twitch response elicited;Palpable increased muscle length  bilateral   Gluteus Minimus Response Palpable increased muscle length;Twitch response elicited  left only   Piriformis Response Twitch response elicited;Palpable increased muscle length  left only              PT Education - 05/31/14 1118    Education provided Yes   Education Details Pt given article on Hypermobility Syndrome and given education for trigger point dry needling precautians and aftercare   Person(s) Educated Patient   Methods Explanation;Handout   Comprehension Verbalized understanding          PT Short Term Goals - 05/21/14 1322    PT SHORT TERM GOAL #1   Title Pt will be able to walk with 50% weightbearing on LLE and LRAD if needed    Time 4   Period Weeks   Status New   PT SHORT TERM GOAL #2   Title Pt will be I with initial HEP   Time 4   Period Weeks   Status New   PT SHORT TERM GOAL #3   Title Pt will be able to sit for 15-20 min with equal weighbearing for meals, appts.    Time 4   Period Weeks   Status New   PT SHORT TERM GOAL #4   Title Pt will be I with concepts fo RICE, posture and body mechanics   Time 4   Period  Weeks   Status New           PT Long Term Goals - 05/21/14 1326    PT LONG TERM GOAL #1   Title Pt will be I  with advanced HEP   Time 8   Period Weeks   Status New   PT LONG TERM GOAL #2   Title Pt will be able to walk without LRAD and occ min pain   Time 8   Status New   PT LONG TERM GOAL #3   Title Pt wil be able to do light housework with occ min pain   Time 8   Period Weeks   Status New   PT LONG TERM GOAL #4   Title Pt will be score 50% or less to demo functional improvement.    Time 8   Period Weeks   Status New   PT LONG TERM GOAL #5   Title Pt will be able to sit with equal WB and min pain as needed for driving, meals   Time 8   Period Weeks   Status New               Plan - 05/31/14 1012    Clinical Impression Statement Pt with difficulty with concentric exercise for hip in prone and seems to have decreased pain with eccentric ham curls and hip extension with PT assist.  Pt was encouraged to pursue aquatic program for strengthening since she is  having difficulty with standing and sitting due to pain and  uses marked compensatory motions to avoid left wt bearing and sitting.  No goals achieved today but pt was educated on trigger point dry needling for pain relief.     Pt will benefit from skilled therapeutic intervention in order to improve on the following deficits Abnormal gait;Decreased range of motion;Difficulty walking;Postural dysfunction;Decreased endurance;Decreased activity tolerance;Increased fascial restricitons;Pain;Decreased balance;Decreased mobility;Decreased strength   Rehab Potential Good   PT Frequency 2x / week   PT Duration 8 weeks   PT Treatment/Interventions ADLs/Self Care Home Management;Moist Heat;Therapeutic activities;Patient/family education;Passive range of motion;Therapeutic exercise;DME Instruction;Ultrasound;Gait training;Balance training;Manual techniques;Dry needling;Neuromuscular re-education;Cryotherapy;Electrical  Stimulation;Functional mobility training;Other (comment)   PT Next Visit Plan stabilizaton and eccentric exercise in prone, assess dry needling benefit        Problem List Patient Active Problem List   Diagnosis Date Noted  . Orthostatic hypotension 12/07/2013  . Adrenal insufficiency 09/25/2013  . Palpitations 09/16/2013  . Cervical stenosis of spinal canal 08/22/2013  . Lumbar degenerative disc disease 12/30/2012  . Tear of lateral meniscus of right knee, current   . Osteopenia   . Lumbar back pain 09/13/2012  . Subjective visual disturbance 07/14/2012  . Endometriosis   . Endometrial polyp   . IBS (irritable bowel syndrome)   . Acid reflux   . Colitis   . Behcet's syndrome   . Glucose intolerance (impaired glucose tolerance) 12/19/2010  . Behcet's disease 01/06/2004  Garen Lah, PT 05/31/2014 11:30 AM Phone: [phone] Fax: [phone]  [facility] [address] [place], [place], [address] Phone: [phone]   Fax:  [phone]

## 2014-06-07 ENCOUNTER — Ambulatory Visit: Payer: 59 | Attending: Physical Medicine and Rehabilitation | Admitting: Physical Therapy

## 2014-06-07 DIAGNOSIS — M25552 Pain in left hip: Secondary | ICD-10-CM | POA: Diagnosis not present

## 2014-06-07 DIAGNOSIS — M2480 Other specific joint derangements of unspecified joint, not elsewhere classified: Secondary | ICD-10-CM | POA: Insufficient documentation

## 2014-06-07 DIAGNOSIS — M25359 Other instability, unspecified hip: Secondary | ICD-10-CM

## 2014-06-07 DIAGNOSIS — R262 Difficulty in walking, not elsewhere classified: Secondary | ICD-10-CM | POA: Insufficient documentation

## 2014-06-07 NOTE — Therapy (Signed)
[facility] [address] [place], [place], [address] Phone: [phone]   Fax:  [phone]  Physical Therapy Treatment  Patient Details  Name: [name] MRN: [id number] Date of Birth: 1960-11-07 Referring Provider:  Laurena Slimmer, MD  Encounter Date: 06/07/2014      PT End of Session - 06/07/14 0852    Visit Number 5   Number of Visits 16   Date for PT Re-Evaluation 07/16/14   PT Start Time 0847   PT Stop Time 0945   PT Time Calculation (min) 58 min      Past Medical History  Diagnosis Date  . Endometriosis   . Endometrial polyp   . IBS (irritable bowel syndrome)   . Acid reflux   . Colitis   . Behcet's syndrome   . GERD (gastroesophageal reflux disease)   . Uveitis   . Osteopenia   . Adrenal insufficiency   . Subjective visual disturbance 07/14/2012  . Tear of lateral meniscus of right knee, current     Tear of lateral & medial meniscus of right knee  . Asthma   . PONV (postoperative nausea and vomiting)     also low blood pressure at times  . Pneumonia     20 years ago  . Refusal of blood transfusions as patient is Jehovah's Witness   . Chronic back pain   . Chronic neck pain     Past Surgical History  Procedure Laterality Date  . Cholecystectomy  12/2004  . Pelvic laparoscopy      DL  . Dilation and curettage of uterus    . Hysteroscopy    . Cesarean section  03/2001  . Knee surgery Left 05/1998  . Hand surgery    . Breast surgery      Cysts excised  . Oophorectomy      BSO  . Vaginal hysterectomy  07/2007    LAVH BSO  . Back surgery    . Knee arthroscopy with lateral menisectomy Right 12/12/2012    Procedure: KNEE ARTHROSCOPY WITH LATERAL MENISECTOMY;  Surgeon: Nilda Simmer, MD;  Location: [facility];  Service: Orthopedics;  Laterality: Right;  . Thyroid cyst excision    . Anterior fusion cervical spine  08/22/2013    c4 5  6         . Anterior cervical  decomp/discectomy fusion N/A 08/22/2013    Procedure: ANTERIOR CERVICAL DECOMPRESSION/DISCECTOMY FUSION 2 LEVELS;  Surgeon: Karn Cassis, MD;  Location: [facility];  Service: Neurosurgery;  Laterality: N/A;  C4-5 C5-6 Anterior cervical decompression/diskectomy/fusion  . Cardiac catheterization  09/18/2013  . Left heart catheterization with coronary angiogram N/A 09/18/2013    Procedure: LEFT HEART CATHETERIZATION WITH CORONARY ANGIOGRAM;  Surgeon: Micheline Chapman, MD;  Location: [facility];  Service: Cardiovascular;  Laterality: N/A;    There were no vitals filed for this visit.  Visit Diagnosis:  Pelvic joint pain, left  Difficulty in walking involving pelvis  Instability of pelvis or thigh joint      Subjective Assessment - 06/07/14 0853    Subjective Dry needling helped well for 2 days and then I was in pain.  I can bear more weight on it. Standing on my crutch is better and more weight but sitting is still difficult   Pertinent History questionable MI/syncope, Behcet's syndrome, L Fusion, C fusion, bilateral knee surgery   Diagnostic tests MRI Pelvis Mild sacroiliac degenerative changes, neg. overall done 04/05/14  Patient Stated Goals decreasing pain   Currently in Pain? Yes   Pain Score 7    Pain Location Pelvis   Pain Orientation Left   Pain Descriptors / Indicators Aching;Throbbing   Pain Type Chronic pain;Acute pain   Pain Relieving Factors dry needling for short time and positioning                         [facility] Adult PT Treatment/Exercise - 06/07/14 0900    Lumbar Exercises: Supine   Other Supine Lumbar Exercises piriformis stretch review    Moist Heat Therapy   Number Minutes Moist Heat 15 Minutes   Moist Heat Location --  left hip   Electrical Stimulation   Electrical Stimulation Location Left SI. Left glute   Electrical Stimulation Action IFC   Electrical Stimulation Parameters 27   Electrical Stimulation Goals Pain   Manual Therapy    Manual Therapy Soft tissue mobilization   Soft tissue mobilization left piriformis, deep rotators prone  with PROM IR /ER   Neck Exercises: Stretches   Upper Trapezius Stretch 3 reps;30 seconds  for right side   Levator Stretch 3 reps;30 seconds  right side VC   Levator Stretch Limitations pain limiting initially   Other Neck Stretches scapular squeazes 10 x           Trigger Point Dry Needling - 06/07/14 0900    Consent Given? Yes   Education Handout Provided --  previously given   Muscles Treated Lower Body Gluteus minimus;Gluteus maximus;Piriformis  left side only   Upper Trapezius Response Twitch reponse elicited;Palpable increased muscle length  right side only   Levator Scapulae Response Twitch response elicited;Palpable increased muscle length  right side only   Gluteus Maximus Response Twitch response elicited;Palpable increased muscle length  left side only   Gluteus Minimus Response Twitch response elicited;Palpable increased muscle length  left side only   Piriformis Response Twitch response elicited;Palpable increased muscle length  left side only              PT Education - 06/07/14 0914    Education provided Yes   Education Details levator, trap stretch  education and review of stretches for piriformis   Person(s) Educated Patient   Methods Explanation;Handout;Demonstration;Verbal cues   Comprehension Verbalized understanding;Returned demonstration;Verbal cues required          PT Short Term Goals - 06/07/14 0929    PT SHORT TERM GOAL #1   Title Pt will be able to walk with 50% weightbearing on LLE and LRAD if needed    Time 4   Period Weeks   Status On-going   PT SHORT TERM GOAL #2   Title Pt will be I with initial HEP   Time 4   Period Weeks   Status On-going   PT SHORT TERM GOAL #3   Title Pt will be able to sit for 15-20 min with equal weighbearing for meals, appts.   improved wt bearing in sitting for 10 minutesw   Time 4   Period  Weeks   Status On-going   PT SHORT TERM GOAL #4   Title Pt will be I with concepts fo RICE, posture and body mechanics   Time 4   Period Weeks   Status On-going           PT Long Term Goals - 06/07/14 0930    PT LONG TERM GOAL #1   Title Pt will be I with  advanced HEP   Time 8   Period Weeks   Status On-going   PT LONG TERM GOAL #2   Title Pt will be able to walk without LRAD and occ min pain   Time 8   Period Weeks   Status On-going   PT LONG TERM GOAL #3   Title Pt wil be able to do light housework with occ min pain   Time 8   Period Weeks   Status On-going   PT LONG TERM GOAL #4   Title Pt will be score 50% or less to demo functional improvement.    Time 8   Period Weeks   Status On-going   PT LONG TERM GOAL #5   Title Pt will be able to sit with equal WB and min pain as needed for driving, meals   Time 8   Period Weeks   Status On-going               Plan - 06/07/14 7322    Clinical Impression Statement Pt with dry needling effective for treatment for 2 days but now back in same pain 7.10 in left hip.  Pt will continue with strengthening and will continue with aquatics next week.  Pt will have dry needling treatment as necessary.  Pt was educated on importance of strengthening for hypermobility and  she has read handouts given to her by PT.      Pt will benefit from skilled therapeutic intervention in order to improve on the following deficits Abnormal gait;Decreased range of motion;Difficulty walking;Postural dysfunction;Decreased endurance;Decreased activity tolerance;Increased fascial restricitons;Pain;Decreased balance;Decreased mobility;Decreased strength   PT Frequency 2x / week   PT Duration 8 weeks   PT Treatment/Interventions ADLs/Self Care Home Management;Moist Heat;Therapeutic activities;Patient/family education;Passive range of motion;Therapeutic exercise;DME Instruction;Ultrasound;Gait training;Balance training;Manual techniques;Dry  needling;Neuromuscular re-education;Cryotherapy;Electrical Stimulation;Functional mobility training;Other (comment)   PT Next Visit Plan stabilizaton and eccentric exercise in prone, schedule with Dry needling as needed.   PT Home Exercise Plan  eccentric hip exercises with PT or assistance.  Continueing strength with [place]alia Needle   Consulted and Agree with Plan of Care Patient        Problem List Patient Active Problem List   Diagnosis Date Noted  . Orthostatic hypotension 12/07/2013  . Adrenal insufficiency 09/25/2013  . Palpitations 09/16/2013  . Cervical stenosis of spinal canal 08/22/2013  . Lumbar degenerative disc disease 12/30/2012  . Tear of lateral meniscus of right knee, current   . Osteopenia   . Lumbar back pain 09/13/2012  . Subjective visual disturbance 07/14/2012  . Endometriosis   . Endometrial polyp   . IBS (irritable bowel syndrome)   . Acid reflux   . Colitis   . Behcet's syndrome   . Glucose intolerance (impaired glucose tolerance) 12/19/2010  . Behcet's disease 01/06/2004    Garen Lah, PT 06/07/2014 1:52 PM Phone: [phone] Fax: [phone]  [facility] [address] [place], [place], [address] Phone: [phone]   Fax:  [phone]

## 2014-06-07 NOTE — Patient Instructions (Signed)
Levator Stretch   Grasp seat or sit on hand on side to be stretched. Turn head toward other side and look down. Use hand on head to gently stretch neck in that position. Hold __25-30__ seconds. Repeat on other side. Repeat __2-3__ times. Do _2-3___ sessions per day.  http://gt2.exer.us/30   Copyright  VHI. All rights reserved.  Side-Bending   One hand on opposite side of head, pull head to side as far as is comfortable. Stop if there is pain. Hold __25-30__ seconds. Repeat with other hand to other side. Repeat __2-3__ times. Do _2-3___ sessions per day.   Copyright  VHI. All rights reserved.  Scapular Retraction (Standing)   With arms at sides, pinch shoulder blades together. Repeat __10__ times per set. Do ___1_ sets per session. Do _2-3___ sessions per day.  http://orth.exer.us/944   Garen Lah, PT 06/07/2014 9:13 AM Phone: [phone] Fax: [phone]

## 2014-06-11 ENCOUNTER — Ambulatory Visit: Payer: 59 | Admitting: Physical Therapy

## 2014-06-11 ENCOUNTER — Telehealth: Payer: Self-pay | Admitting: Cardiovascular Disease

## 2014-06-11 DIAGNOSIS — M25552 Pain in left hip: Secondary | ICD-10-CM | POA: Diagnosis not present

## 2014-06-11 DIAGNOSIS — R262 Difficulty in walking, not elsewhere classified: Secondary | ICD-10-CM

## 2014-06-11 DIAGNOSIS — M25359 Other instability, unspecified hip: Secondary | ICD-10-CM

## 2014-06-11 NOTE — Therapy (Signed)
[facility] [address] [place], [place], [address] Phone: [phone]   Fax:  [phone]  Physical Therapy Treatment  Patient Details  Name: [name] MRN: [id number] Date of Birth: 05/10/1960 Referring Provider:  Laurena Slimmer, MD  Encounter Date: 06/11/2014      PT End of Session - 06/11/14 1336    Visit Number 6   Number of Visits 16   Date for PT Re-Evaluation 07/16/14   PT Start Time 0128   PT Stop Time 0230   PT Time Calculation (min) 62 min      Past Medical History  Diagnosis Date  . Endometriosis   . Endometrial polyp   . IBS (irritable bowel syndrome)   . Acid reflux   . Colitis   . Behcet's syndrome   . GERD (gastroesophageal reflux disease)   . Uveitis   . Osteopenia   . Adrenal insufficiency   . Subjective visual disturbance 07/14/2012  . Tear of lateral meniscus of right knee, current     Tear of lateral & medial meniscus of right knee  . Asthma   . PONV (postoperative nausea and vomiting)     also low blood pressure at times  . Pneumonia     20 years ago  . Refusal of blood transfusions as patient is Jehovah's Witness   . Chronic back pain   . Chronic neck pain     Past Surgical History  Procedure Laterality Date  . Cholecystectomy  12/2004  . Pelvic laparoscopy      DL  . Dilation and curettage of uterus    . Hysteroscopy    . Cesarean section  03/2001  . Knee surgery Left 05/1998  . Hand surgery    . Breast surgery      Cysts excised  . Oophorectomy      BSO  . Vaginal hysterectomy  07/2007    LAVH BSO  . Back surgery    . Knee arthroscopy with lateral menisectomy Right 12/12/2012    Procedure: KNEE ARTHROSCOPY WITH LATERAL MENISECTOMY;  Surgeon: Nilda Simmer, MD;  Location: [facility];  Service: Orthopedics;  Laterality: Right;  . Thyroid cyst excision    . Anterior fusion cervical spine  08/22/2013    c4 5  6         . Anterior cervical  decomp/discectomy fusion N/A 08/22/2013    Procedure: ANTERIOR CERVICAL DECOMPRESSION/DISCECTOMY FUSION 2 LEVELS;  Surgeon: Karn Cassis, MD;  Location: [facility];  Service: Neurosurgery;  Laterality: N/A;  C4-5 C5-6 Anterior cervical decompression/diskectomy/fusion  . Cardiac catheterization  09/18/2013  . Left heart catheterization with coronary angiogram N/A 09/18/2013    Procedure: LEFT HEART CATHETERIZATION WITH CORONARY ANGIOGRAM;  Surgeon: Micheline Chapman, MD;  Location: [facility];  Service: Cardiovascular;  Laterality: N/A;    There were no vitals filed for this visit.  Visit Diagnosis:  Pelvic joint pain, left  Difficulty in walking involving pelvis  Instability of pelvis or thigh joint          [facility] PT Assessment - 06/11/14 1358    Strength   Left Hip Extension 3/5                     [facility] Adult PT Treatment/Exercise - 06/11/14 1345    Lumbar Exercises: Supine   Bridge 10 reps   Bridge Limitations bridge with ball squeeze x5   Other Supine Lumbar Exercises glut  squeeze x 10, clam x 10   Other Supine Lumbar Exercises ball squeeze x 10, isometric clam into belt x 10   Lumbar Exercises: Sidelying   Clam 10 reps   Clam Limitations fatigue   Lumbar Exercises: Prone   Other Prone Lumbar Exercises glut squeeze x 10, heel squeeze x 10, AROM IR/ER x 10   Other Prone Lumbar Exercises AROM prone hip extension x 10 and donkey kicks x 10each  left without need for assist    Lumbar Exercises: Quadruped   Other Quadruped Lumbar Exercises weight shifting right to left front to back and circles, pt is unable to make full circle going left first.   Other Quadruped Lumbar Exercises high kneeling with PTA holding ankles, eccentric quad lean forward/back with emphasis from pt to use more left   Cryotherapy   Number Minutes Cryotherapy 15 Minutes   Cryotherapy Location Lumbar Spine   Type of Cryotherapy Ice pack   Electrical Stimulation   Electrical Stimulation  Location Left SI. Left glute   Electrical Stimulation Action IFC   Electrical Stimulation Parameters 27   Electrical Stimulation Goals Pain                  PT Short Term Goals - 06/07/14 0929    PT SHORT TERM GOAL #1   Title Pt will be able to walk with 50% weightbearing on LLE and LRAD if needed    Time 4   Period Weeks   Status On-going   PT SHORT TERM GOAL #2   Title Pt will be I with initial HEP   Time 4   Period Weeks   Status On-going   PT SHORT TERM GOAL #3   Title Pt will be able to sit for 15-20 min with equal weighbearing for meals, appts.   improved wt bearing in sitting for 10 minutesw   Time 4   Period Weeks   Status On-going   PT SHORT TERM GOAL #4   Title Pt will be I with concepts fo RICE, posture and body mechanics   Time 4   Period Weeks   Status On-going           PT Long Term Goals - 06/07/14 0930    PT LONG TERM GOAL #1   Title Pt will be I with advanced HEP   Time 8   Period Weeks   Status On-going   PT LONG TERM GOAL #2   Title Pt will be able to walk without LRAD and occ min pain   Time 8   Period Weeks   Status On-going   PT LONG TERM GOAL #3   Title Pt wil be able to do light housework with occ min pain   Time 8   Period Weeks   Status On-going   PT LONG TERM GOAL #4   Title Pt will be score 50% or less to demo functional improvement.    Time 8   Period Weeks   Status On-going   PT LONG TERM GOAL #5   Title Pt will be able to sit with equal WB and min pain as needed for driving, meals   Time 8   Period Weeks   Status On-going               Plan - 06/11/14 1336    Clinical Impression Statement Pt demonstrates improved hip extension strength in prone and is able to perform AROM without assist. Pt report unable to sit  with qual WB for longer than 10 minutes at a time. She is able to take 3 steps from bed to bathroom without crutch. She is bearing 50% weight through LLE while using crutch for 2 minutes at a time.  Slow progress toward weightbearing golas.    PT Next Visit Plan stabilizaton and eccentric exercise in prone, schedule with Dry needling as needed.        Problem List Patient Active Problem List   Diagnosis Date Noted  . Orthostatic hypotension 12/07/2013  . Adrenal insufficiency 09/25/2013  . Palpitations 09/16/2013  . Cervical stenosis of spinal canal 08/22/2013  . Lumbar degenerative disc disease 12/30/2012  . Tear of lateral meniscus of right knee, current   . Osteopenia   . Lumbar back pain 09/13/2012  . Subjective visual disturbance 07/14/2012  . Endometriosis   . Endometrial polyp   . IBS (irritable bowel syndrome)   . Acid reflux   . Colitis   . Behcet's syndrome   . Glucose intolerance (impaired glucose tolerance) 12/19/2010  . Behcet's disease 01/06/2004    Sherrie Mustache , PTA  06/11/2014, 2:16 PM  [facility] [address] [place], [place], [address] Phone: [phone]   Fax:  [phone]

## 2014-06-11 NOTE — Telephone Encounter (Signed)
Dental office calling for instructions for patient in chair, routine cleaning only - per notes & per triage criteria pt should not need antibiotic prophylaxis, advised OK to proceed w/ cleaning.  They requested fax number for future clearance for extractions, etc - this was given.

## 2014-06-11 NOTE — Telephone Encounter (Signed)
Pt is going to have a dental cleaning today,she is there now. Does she need an antibiotic?

## 2014-06-13 ENCOUNTER — Encounter (HOSPITAL_COMMUNITY)
Admission: RE | Admit: 2014-06-13 | Discharge: 2014-06-13 | Disposition: A | Discharge status: Home / Self Care | Payer: 59 | Source: Ambulatory Visit | Attending: Internal Medicine | Admitting: Internal Medicine

## 2014-06-13 ENCOUNTER — Encounter (HOSPITAL_COMMUNITY): Payer: Self-pay

## 2014-06-13 ENCOUNTER — Other Ambulatory Visit (HOSPITAL_COMMUNITY): Payer: Self-pay | Admitting: Internal Medicine

## 2014-06-13 ENCOUNTER — Telehealth: Payer: Self-pay | Admitting: *Deleted

## 2014-06-13 ENCOUNTER — Encounter: Payer: 59 | Admitting: Physical Therapy

## 2014-06-13 DIAGNOSIS — M352 Behcet's disease: Secondary | ICD-10-CM | POA: Diagnosis not present

## 2014-06-13 MED ORDER — DIPHENHYDRAMINE HCL 25 MG PO CAPS
25.0000 mg | ORAL_CAPSULE | Freq: Once | ORAL | Status: AC
  Administered 2014-06-13: 25 mg via ORAL
  Filled 2014-06-13 (×2): qty 1

## 2014-06-13 MED ORDER — SODIUM CHLORIDE 0.9 % IV SOLN
5.0000 mg/kg | Freq: Once | INTRAVENOUS | Status: AC
  Administered 2014-06-13: 400 mg via INTRAVENOUS
  Filled 2014-06-13: qty 40

## 2014-06-13 MED ORDER — SODIUM CHLORIDE 0.9 % IV SOLN
Freq: Every day | INTRAVENOUS | Status: DC
  Administered 2014-06-13: 08:00:00 via INTRAVENOUS

## 2014-06-13 MED ORDER — ACETAMINOPHEN 325 MG PO TABS
650.0000 mg | ORAL_TABLET | Freq: Once | ORAL | Status: AC
  Administered 2014-06-13: 650 mg via ORAL
  Filled 2014-06-13: qty 2

## 2014-06-13 NOTE — Discharge Instructions (Signed)
ATTENTION:  If you are going to be 15 or more minutes late for your appointment, please call [phone] to make other arrangements for your treatment.  If you arrive early for your schedule appointment, you may have to wait until your scheduled time.   Call your MD for any problems or questions.   Infliximab injection What is this medicine? INFLIXIMAB (in FLIX i mab) is used to treat Crohn's disease and ulcerative colitis. It is also used to treat ankylosing spondylitis, psoriasis, and some forms of arthritis. This medicine may be used for other purposes; ask your health care provider or pharmacist if you have questions. COMMON BRAND NAME(S): Remicade What should I tell my health care provider before I take this medicine? They need to know if you have any of these conditions: -diabetes -exposure to tuberculosis -heart failure -hepatitis or liver disease -immune system problems -infection -lung or breathing disease, like COPD -multiple sclerosis -current or past resident of [place] -seizure disorder -an unusual or allergic reaction to infliximab, mouse proteins, other medicines, foods, dyes, or preservatives -pregnant or trying to get pregnant -breast-feeding How should I use this medicine? This medicine is for injection into a vein. It is usually given by a health care professional in a hospital or clinic setting. A special MedGuide will be given to you by the pharmacist with each prescription and refill. Be sure to read this information carefully each time. Talk to your pediatrician regarding the use of this medicine in children. Special care may be needed. Overdosage: If you think you have taken too much of this medicine contact a poison control center or emergency room at once. NOTE: This medicine is only for you. Do not share this medicine with others. What if I miss a dose? It is important not to miss your dose. Call your doctor or health care  professional if you are unable to keep an appointment. What may interact with this medicine? Do not take this medicine with any of the following medications: -anakinra -rilonacept This medicine may also interact with the following medications: -vaccines This list may not describe all possible interactions. Give your health care provider a list of all the medicines, herbs, non-prescription drugs, or dietary supplements you use. Also tell them if you smoke, drink alcohol, or use illegal drugs. Some items may interact with your medicine. What should I watch for while using this medicine? Visit your doctor or health care professional for regular checks on your progress. If you get a cold or other infection while receiving this medicine, call your doctor or health care professional. Do not treat yourself. This medicine may decrease your body's ability to fight infections. Before beginning therapy, your doctor may do a test to see if you have been exposed to tuberculosis. This medicine may make the symptoms of heart failure worse in some patients. If you notice symptoms such as increased shortness of breath or swelling of the ankles or legs, contact your health care provider right away. If you are going to have surgery or dental work, tell your health care professional or dentist that you have received this medicine. If you take this medicine for plaque psoriasis, stay out of the sun. If you cannot avoid being in the sun, wear protective clothing and use sunscreen. Do not use sun lamps or tanning beds/booths. What side effects may I notice from receiving this medicine? Side effects that you should report to your doctor or health care professional as soon  as possible: -allergic reactions like skin rash, itching or hives, swelling of the face, lips, or tongue -chest pain -fever or chills, usually related to the infusion -muscle or joint pain -red, scaly patches or raised bumps on the skin -signs of  infection - fever or chills, cough, sore throat, pain or difficulty passing urine -swollen lymph nodes in the neck, underarm, or groin areas -unexplained weight loss -unusual bleeding or bruising -unusually weak or tired -yellowing of the eyes or skin Side effects that usually do not require medical attention (report to your doctor or health care professional if they continue or are bothersome): -headache -heartburn or stomach pain -nausea, vomiting This list may not describe all possible side effects. Call your doctor for medical advice about side effects. You may report side effects to FDA at 1-800-FDA-1088. Where should I keep my medicine? This drug is given in a hospital or clinic and will not be stored at home. NOTE: This sheet is a summary. It may not cover all possible information. If you have questions about this medicine, talk to your doctor, pharmacist, or health care provider.  2015, Elsevier/Gold Standard. (2007-08-10 10:26:02)

## 2014-06-13 NOTE — Telephone Encounter (Signed)
Signed clearance for dental work to be performed.  No SBE prophylaxis required, OK to use local anesthetic but NO Epinephrine, OK for deep scale cleaning and OK to hold anti-coag 5 days prior to dental procedures faxed to Dr. Excell Seltzer.

## 2014-06-13 NOTE — Progress Notes (Signed)
Patient received first Remicade infusion today. She brought own Remicade bottles picked up from [facility] outpatient pharmacy yesterday (patient stated she was instructed to do this per her insurance company). RN took to inpatient pharmacy for pharmacist to prepare. Patient has medication for next infusion in two weeks at home (also picked up yesterday at outpatient pharmacy). Spoke to [facility] pharmacist regarding patient bringing Remicade from our outpatient pharmacy for next infusion as she already has it. After that she will not bring the Remicade as inpatient pharmacy will provide.  Tolerated infusion this am well.

## 2014-06-15 ENCOUNTER — Ambulatory Visit (INDEPENDENT_AMBULATORY_CARE_PROVIDER_SITE_OTHER): Payer: 59 | Admitting: Gynecology

## 2014-06-15 ENCOUNTER — Encounter: Payer: Self-pay | Admitting: Gynecology

## 2014-06-15 ENCOUNTER — Other Ambulatory Visit (HOSPITAL_COMMUNITY)
Admission: RE | Admit: 2014-06-15 | Discharge: 2014-06-15 | Disposition: A | Discharge status: Home / Self Care | Payer: 59 | Source: Ambulatory Visit | Attending: Gynecology | Admitting: Gynecology

## 2014-06-15 VITALS — BP 116/74 | Ht 65.0 in | Wt 170.0 lb

## 2014-06-15 DIAGNOSIS — Z01419 Encounter for gynecological examination (general) (routine) without abnormal findings: Secondary | ICD-10-CM

## 2014-06-15 DIAGNOSIS — N952 Postmenopausal atrophic vaginitis: Secondary | ICD-10-CM | POA: Diagnosis not present

## 2014-06-15 NOTE — Progress Notes (Signed)
[name] [birth year] [id number]        54 y.o.  [id number] for annual exam.  Former patient Dr. Eda Paschal. Several issues noted below  Past medical history,surgical history, problem list, medications, allergies, family history and social history were all reviewed and documented as reviewed in the EPIC chart.  ROS:  Performed with pertinent positives and negatives included in the history, assessment and plan.   Additional significant findings :  None GYN   Exam: Kim assistant Filed Vitals:   06/15/14 0958  BP: 116/74  Height: 5\' 5"  (1.651 m)  Weight: 170 lb (77.111 kg)   General appearance:  Normal affect, orientation and appearance. Skin: Grossly normal HEENT: Without gross lesions.  No cervical or supraclavicular adenopathy. Thyroid normal.  Lungs:  Clear without wheezing, rales or rhonchi Cardiac: RR, without RMG Abdominal:  Soft, nontender, without masses, guarding, rebound, organomegaly or hernia Breasts:  Examined lying and sitting without masses, retractions, discharge or axillary adenopathy. Pelvic:  Ext/BUS/vagina with mild atrophic changes. Pap smear done  Adnexa  Without masses or tenderness    Anus and perineum  Normal   Rectovaginal  Normal sphincter tone without palpated masses or tenderness.    Assessment/Plan:  54 y.o. 46 female for annual exam.   1. Menopausal symptoms. Status post TVH BSO for endometriosis pain and bleeding 2009.  Had been on low-dose ERT patch but this has been discontinued by her other physicians were following her for her other medical issues to include adrenal insufficiency. She is having some hot flashes but overall tolerable. We'll continue to monitor. 2. Osteoporosis. Followed by [facility]. She reports her last DEXA showed T score -2.7. On Atelvia. She will continue to follow up with them for this. 3. Pap smear 2011. Pap smear of vaginal cuff today. Options to stop screening per current screening guidelines as she is status post  hysterectomy for benign indications reviewed. Will readdress on annual basis. 4. Mammography 04/2014. Continue with annual mammography. SBE monthly reviewed. 5. Colonoscopy 2015. Repeat at their recommended interval. 6. Health maintenance. No routine blood work done as this is all done at her other doctor's offices. Follow up in one year, sooner as needed.     05/2014 MD, 10:24 AM 06/15/2014

## 2014-06-15 NOTE — Addendum Note (Signed)
Addended by: Dayna Barker on: 06/15/2014 10:35 AM   Modules accepted: Orders

## 2014-06-15 NOTE — Patient Instructions (Signed)
You may obtain a copy of any labs that were done today by logging onto MyChart as outlined in the instructions provided with your AVS (after visit summary). The office will not call with normal lab results but certainly if there are any significant abnormalities then we will contact you.   Health Maintenance, Female A healthy lifestyle and preventative care can promote health and wellness.  Maintain regular health, dental, and eye exams.  Eat a healthy diet. Foods like vegetables, fruits, whole grains, low-fat dairy products, and lean protein foods contain the nutrients you need without too many calories. Decrease your intake of foods high in solid fats, added sugars, and salt. Get information about a proper diet from your caregiver, if necessary.  Regular physical exercise is one of the most important things you can do for your health. Most adults should get at least 150 minutes of moderate-intensity exercise (any activity that increases your heart rate and causes you to sweat) each week. In addition, most adults need muscle-strengthening exercises on 2 or more days a week.   Maintain a healthy weight. The body mass index (BMI) is a screening tool to identify possible weight problems. It provides an estimate of body fat based on height and weight. Your caregiver can help determine your BMI, and can help you achieve or maintain a healthy weight. For adults 20 years and older:  A BMI below 18.5 is considered underweight.  A BMI of 18.5 to 24.9 is normal.  A BMI of 25 to 29.9 is considered overweight.  A BMI of 30 and above is considered obese.  Maintain normal blood lipids and cholesterol by exercising and minimizing your intake of saturated fat. Eat a balanced diet with plenty of fruits and vegetables. Blood tests for lipids and cholesterol should begin at age 61 and be repeated every 5 years. If your lipid or cholesterol levels are high, you are over 50, or you are a high risk for heart  disease, you may need your cholesterol levels checked more frequently.Ongoing high lipid and cholesterol levels should be treated with medicines if diet and exercise are not effective.  If you smoke, find out from your caregiver how to quit. If you do not use tobacco, do not start.  Lung cancer screening is recommended for adults aged 33 80 years who are at high risk for developing lung cancer because of a history of smoking. Yearly low-dose computed tomography (CT) is recommended for people who have at least a 30-pack-year history of smoking and are a current smoker or have quit within the past 15 years. A pack year of smoking is smoking an average of 1 pack of cigarettes a day for 1 year (for example: 1 pack a day for 30 years or 2 packs a day for 15 years). Yearly screening should continue until the smoker has stopped smoking for at least 15 years. Yearly screening should also be stopped for people who develop a health problem that would prevent them from having lung cancer treatment.  If you are pregnant, do not drink alcohol. If you are breastfeeding, be very cautious about drinking alcohol. If you are not pregnant and choose to drink alcohol, do not exceed 1 drink per day. One drink is considered to be 12 ounces (355 mL) of beer, 5 ounces (148 mL) of wine, or 1.5 ounces (44 mL) of liquor.  Avoid use of street drugs. Do not share needles with anyone. Ask for help if you need support or instructions about stopping  the use of drugs.  High blood pressure causes heart disease and increases the risk of stroke. Blood pressure should be checked at least every 1 to 2 years. Ongoing high blood pressure should be treated with medicines, if weight loss and exercise are not effective.  If you are 59 to 52 years old, ask your caregiver if you should take aspirin to prevent strokes.  Diabetes screening involves taking a blood sample to check your fasting blood sugar level. This should be done once every 3  years, after age 91, if you are within normal weight and without risk factors for diabetes. Testing should be considered at a younger age or be carried out more frequently if you are overweight and have at least 1 risk factor for diabetes.  Breast cancer screening is essential preventative care for women. You should practice "breast self-awareness." This means understanding the normal appearance and feel of your breasts and may include breast self-examination. Any changes detected, no matter how small, should be reported to a caregiver. Women in their 66s and 30s should have a clinical breast exam (CBE) by a caregiver as part of a regular health exam every 1 to 3 years. After age 101, women should have a CBE every year. Starting at age 100, women should consider having a mammogram (breast X-ray) every year. Women who have a family history of breast cancer should talk to their caregiver about genetic screening. Women at a high risk of breast cancer should talk to their caregiver about having an MRI and a mammogram every year.  Breast cancer gene (BRCA)-related cancer risk assessment is recommended for women who have family members with BRCA-related cancers. BRCA-related cancers include breast, ovarian, tubal, and peritoneal cancers. Having family members with these cancers may be associated with an increased risk for harmful changes (mutations) in the breast cancer genes BRCA1 and BRCA2. Results of the assessment will determine the need for genetic counseling and BRCA1 and BRCA2 testing.  The Pap test is a screening test for cervical cancer. Women should have a Pap test starting at age 57. Between ages 25 and 35, Pap tests should be repeated every 2 years. Beginning at age 37, you should have a Pap test every 3 years as long as the past 3 Pap tests have been normal. If you had a hysterectomy for a problem that was not cancer or a condition that could lead to cancer, then you no longer need Pap tests. If you are  between ages 50 and 76, and you have had normal Pap tests going back 10 years, you no longer need Pap tests. If you have had past treatment for cervical cancer or a condition that could lead to cancer, you need Pap tests and screening for cancer for at least 20 years after your treatment. If Pap tests have been discontinued, risk factors (such as a new sexual partner) need to be reassessed to determine if screening should be resumed. Some women have medical problems that increase the chance of getting cervical cancer. In these cases, your caregiver may recommend more frequent screening and Pap tests.  The human papillomavirus (HPV) test is an additional test that may be used for cervical cancer screening. The HPV test looks for the virus that can cause the cell changes on the cervix. The cells collected during the Pap test can be tested for HPV. The HPV test could be used to screen women aged 44 years and older, and should be used in women of any age  who have unclear Pap test results. After the age of 55, women should have HPV testing at the same frequency as a Pap test.  Colorectal cancer can be detected and often prevented. Most routine colorectal cancer screening begins at the age of 44 and continues through age 20. However, your caregiver may recommend screening at an earlier age if you have risk factors for colon cancer. On a yearly basis, your caregiver may provide home test kits to check for hidden blood in the stool. Use of a small camera at the end of a tube, to directly examine the colon (sigmoidoscopy or colonoscopy), can detect the earliest forms of colorectal cancer. Talk to your caregiver about this at age 86, when routine screening begins. Direct examination of the colon should be repeated every 5 to 10 years through age 13, unless early forms of pre-cancerous polyps or small growths are found.  Hepatitis C blood testing is recommended for all people born from [birth year] through 1963 and any  individual with known risks for hepatitis C.  Practice safe sex. Use condoms and avoid high-risk sexual practices to reduce the spread of sexually transmitted infections (STIs). Sexually active women aged 36 and younger should be checked for Chlamydia, which is a common sexually transmitted infection. Older women with new or multiple partners should also be tested for Chlamydia. Testing for other STIs is recommended if you are sexually active and at increased risk.  Osteoporosis is a disease in which the bones lose minerals and strength with aging. This can result in serious bone fractures. The risk of osteoporosis can be identified using a bone density scan. Women ages 20 and over and women at risk for fractures or osteoporosis should discuss screening with their caregivers. Ask your caregiver whether you should be taking a calcium supplement or vitamin D to reduce the rate of osteoporosis.  Menopause can be associated with physical symptoms and risks. Hormone replacement therapy is available to decrease symptoms and risks. You should talk to your caregiver about whether hormone replacement therapy is right for you.  Use sunscreen. Apply sunscreen liberally and repeatedly throughout the day. You should seek shade when your shadow is shorter than you. Protect yourself by wearing long sleeves, pants, a wide-brimmed hat, and sunglasses year round, whenever you are outdoors.  Notify your caregiver of new moles or changes in moles, especially if there is a change in shape or color. Also notify your caregiver if a mole is larger than the size of a pencil eraser.  Stay current with your immunizations. Document Released: 07/07/2010 Document Revised: 04/18/2012 Document Reviewed: 07/07/2010 [facility] Patient Information 2014 [employer].

## 2014-06-16 LAB — URINALYSIS W MICROSCOPIC + REFLEX CULTURE
Bacteria, UA: NONE SEEN
Bilirubin Urine: NEGATIVE
Casts: NONE SEEN
Crystals: NONE SEEN
Glucose, UA: NEGATIVE mg/dL
Hgb urine dipstick: NEGATIVE
Ketones, ur: NEGATIVE mg/dL
Leukocytes, UA: NEGATIVE
Nitrite: NEGATIVE
Protein, ur: NEGATIVE mg/dL
Specific Gravity, Urine: 1.016 (ref 1.005–1.030)
Squamous Epithelial / LPF: NONE SEEN
Urobilinogen, UA: 0.2 mg/dL (ref 0.0–1.0)
pH: 6.5 (ref 5.0–8.0)

## 2014-06-18 LAB — CYTOLOGY - PAP

## 2014-06-19 ENCOUNTER — Ambulatory Visit: Payer: 59 | Admitting: Physical Therapy

## 2014-06-19 DIAGNOSIS — M25552 Pain in left hip: Secondary | ICD-10-CM | POA: Diagnosis not present

## 2014-06-19 DIAGNOSIS — R262 Difficulty in walking, not elsewhere classified: Secondary | ICD-10-CM

## 2014-06-19 DIAGNOSIS — M25359 Other instability, unspecified hip: Secondary | ICD-10-CM

## 2014-06-19 NOTE — Therapy (Signed)
[place], [place], [address] Phone: [phone]   Fax:  [phone]  Physical Therapy Treatment  Patient Details  Name: [name] MRN: [id number] Date of Birth: 03-28-1960 Referring Provider:  Laroy Apple, MD  Encounter Date: 06/19/2014      PT End of Session - 06/19/14 1320    Visit Number 7   Number of Visits 16   Date for PT Re-Evaluation 07/16/14   PT Start Time 0100   PT Stop Time 1105   PT Time Calculation (min) 50 min   Activity Tolerance Patient tolerated treatment well   Behavior During Therapy [facility] for tasks assessed/performed      Past Medical History  Diagnosis Date  . IBS (irritable bowel syndrome)   . Acid reflux   . Colitis   . Behcet's syndrome   . GERD (gastroesophageal reflux disease)   . Uveitis   . Adrenal insufficiency   . Subjective visual disturbance 07/14/2012  . Tear of lateral meniscus of right knee, current     Tear of lateral & medial meniscus of right knee  . Asthma   . PONV (postoperative nausea and vomiting)     also low blood pressure at times  . Pneumonia     20 years ago  . Refusal of blood transfusions as patient is Jehovah's Witness   . Chronic back pain   . Chronic neck pain   . Osteoporosis     T score reported -2.7    Past Surgical History  Procedure Laterality Date  . Cholecystectomy  12/2004  . Pelvic laparoscopy      DL  . Dilation and curettage of uterus    . Hysteroscopy    . Cesarean section  03/2001  . Knee surgery Left 05/1998  . Hand surgery    . Breast surgery      Cysts excised  . Oophorectomy      BSO  . Vaginal hysterectomy  07/2007    LAVH BSO  . Back surgery    . Knee arthroscopy with lateral menisectomy Right 12/12/2012    Procedure: KNEE ARTHROSCOPY WITH LATERAL MENISECTOMY;  Surgeon: Lorn Junes, MD;  Location: [facility];  Service: Orthopedics;  Laterality: Right;  . Thyroid cyst excision     . Anterior fusion cervical spine  08/22/2013    c4 5  6         . Anterior cervical decomp/discectomy fusion N/A 08/22/2013    Procedure: ANTERIOR CERVICAL DECOMPRESSION/DISCECTOMY FUSION 2 LEVELS;  Surgeon: Floyce Stakes, MD;  Location: [facility];  Service: Neurosurgery;  Laterality: N/A;  C4-5 C5-6 Anterior cervical decompression/diskectomy/fusion  . Cardiac catheterization  09/18/2013  . Left heart catheterization with coronary angiogram N/A 09/18/2013    Procedure: LEFT HEART CATHETERIZATION WITH CORONARY ANGIOGRAM;  Surgeon: Blane Ohara, MD;  Location: [facility] CATH LAB;  Service: Cardiovascular;  Laterality: N/A;    There were no vitals filed for this visit.  Visit Diagnosis:  Pelvic joint pain, left  Difficulty in walking involving pelvis  Instability of pelvis or thigh joint      Subjective Assessment - 06/19/14 1020    Subjective Saw Dr. Ron Agee, will do another injection next Fri.  I can do some walking in my kitchen without crutch.  BUT patient fell Saturday and hit L shoulder on a knob.     Currently in Pain? Yes   Pain Score 5    Pain Location Sacrum  Pain Orientation Left   Pain Descriptors / Indicators Aching   Pain Type Chronic pain   Pain Onset More than a month ago   Pain Frequency Intermittent            [facility] Adult PT Treatment/Exercise - 06/19/14 1035    Lumbar Exercises: Supine   Clam 10 reps   Heel Slides 10 reps   Heel Slides Limitations difficulty pulling L LE up (knee flexion)   Bent Knee Raise 10 reps   Bent Knee Raise Limitations asked pt to post tilt gently with these ex due to weakness and tendencies   Dead Bug 10 reps   Other Supine Lumbar Exercises SLR flexion ext and abd x 10 each LE (demo HEP)   Other Supine Lumbar Exercises stability ex with 4 lbs weight overhead and unilateral Horiz abd x 10 each    Manual Therapy   Manual Therapy Soft tissue mobilization   Manual therapy comments used Roc Blade   Soft tissue mobilization L  piriformis, obturator, hamstring attachment           PT Education - 06/19/14 1319    Education provided Yes   Education Details core HEP and need for consistent, daily HEP   Person(s) Educated Patient   Methods Explanation   Comprehension Verbalized understanding;Returned demonstration          PT Short Term Goals - 06/19/14 1025    PT SHORT TERM GOAL #1   Title Pt will be able to walk with 50% weightbearing on LLE and LRAD if needed    Baseline 25-40%   Status On-going   PT SHORT TERM GOAL #2   Title Pt will be I with initial HEP   Status Achieved   PT SHORT TERM GOAL #3   Title Pt will be able to sit for 15-20 min with equal weighbearing for meals, appts.    Baseline 10 min    Status On-going   PT SHORT TERM GOAL #4   Title Pt will be I with concepts fo RICE, posture and body mechanics   Status Achieved           PT Long Term Goals - 06/19/14 1029    PT LONG TERM GOAL #1   Title Pt will be I with advanced HEP   Status On-going   PT LONG TERM GOAL #2   Title Pt will be able to walk without LRAD and occ min pain   Status On-going   PT LONG TERM GOAL #3   Title Pt wil be able to do light housework with occ min pain   Baseline limits, son helps with laundry carrying, has someone to clean her house   Status Partially Met   PT LONG TERM GOAL #4   Title Pt will be score 50% or less to demo functional improvement.    Status On-going   PT LONG TERM GOAL #5   Title Pt will be able to sit with equal WB and min pain as needed for driving, meals   Status On-going               Plan - 06/19/14 1320    Clinical Impression Statement No further gains with respect to equal weight bearing.  Reports doing 25-40% in standing, still cannot put heel down.  Painful with soft tissue to L hip, tolerated Roc blade well and reports relief post, able to weigh bear better as she walked out of clinic. Patient does not want to  consider surgery. She joined aquatic center.       PT Next Visit Plan stabilizaton and eccentric exercise in prone, use Roc, try Reformer   PT Home Exercise Plan  eccentric hip exercises with PT or assistance.  Continueing strength with Guido Sander        Problem List Patient Active Problem List   Diagnosis Date Noted  . Orthostatic hypotension 12/07/2013  . Adrenal insufficiency 09/25/2013  . Palpitations 09/16/2013  . Cervical stenosis of spinal canal 08/22/2013  . Lumbar degenerative disc disease 12/30/2012  . Tear of lateral meniscus of right knee, current   . Osteopenia   . Lumbar back pain 09/13/2012  . Subjective visual disturbance 07/14/2012  . Endometriosis   . Endometrial polyp   . IBS (irritable bowel syndrome)   . Acid reflux   . Colitis   . Behcet's syndrome   . Glucose intolerance (impaired glucose tolerance) 12/19/2010  . Behcet's disease 01/06/2004    Claudia Alvizo 06/19/2014, 1:29 PM  [facility] [address] [place], [place], [address] Phone: [phone]   Fax:  [phone]

## 2014-06-21 ENCOUNTER — Ambulatory Visit: Payer: 59 | Admitting: Physical Therapy

## 2014-06-21 DIAGNOSIS — M25552 Pain in left hip: Secondary | ICD-10-CM

## 2014-06-21 DIAGNOSIS — M25359 Other instability, unspecified hip: Secondary | ICD-10-CM

## 2014-06-21 DIAGNOSIS — R262 Difficulty in walking, not elsewhere classified: Secondary | ICD-10-CM

## 2014-06-21 NOTE — Patient Instructions (Signed)
Using stability ball under sacrum: Clam bilateral, unilateral Heel slide OR straight leg raise (either) Marching  Keep pelvis level and maintain a very slight posterior tilt of pelvis for abdominal activation

## 2014-06-21 NOTE — Therapy (Signed)
[place], [place], [address] Phone: [phone]   Fax:  [phone]  Physical Therapy Treatment  Patient Details  Name: [name] MRN: [id number] Date of Birth: 1960-09-21 Referring Provider:  Foye Spurling, MD  Encounter Date: 06/21/2014      PT End of Session - 06/21/14 1041    Visit Number 8   Number of Visits 16   Date for PT Re-Evaluation 07/16/14   PT Start Time 1016   PT Stop Time 1110   PT Time Calculation (min) 54 min   Activity Tolerance Patient tolerated treatment well   Behavior During Therapy [facility] for tasks assessed/performed      Past Medical History  Diagnosis Date  . IBS (irritable bowel syndrome)   . Acid reflux   . Colitis   . Behcet's syndrome   . GERD (gastroesophageal reflux disease)   . Uveitis   . Adrenal insufficiency   . Subjective visual disturbance 07/14/2012  . Tear of lateral meniscus of right knee, current     Tear of lateral & medial meniscus of right knee  . Asthma   . PONV (postoperative nausea and vomiting)     also low blood pressure at times  . Pneumonia     20 years ago  . Refusal of blood transfusions as patient is Jehovah's Witness   . Chronic back pain   . Chronic neck pain   . Osteoporosis     T score reported -2.7    Past Surgical History  Procedure Laterality Date  . Cholecystectomy  12/2004  . Pelvic laparoscopy      DL  . Dilation and curettage of uterus    . Hysteroscopy    . Cesarean section  03/2001  . Knee surgery Left 05/1998  . Hand surgery    . Breast surgery      Cysts excised  . Oophorectomy      BSO  . Vaginal hysterectomy  07/2007    LAVH BSO  . Back surgery    . Knee arthroscopy with lateral menisectomy Right 12/12/2012    Procedure: KNEE ARTHROSCOPY WITH LATERAL MENISECTOMY;  Surgeon: Lorn Junes, MD;  Location: [facility];  Service: Orthopedics;  Laterality: Right;  . Thyroid cyst excision     . Anterior fusion cervical spine  08/22/2013    c4 5  6         . Anterior cervical decomp/discectomy fusion N/A 08/22/2013    Procedure: ANTERIOR CERVICAL DECOMPRESSION/DISCECTOMY FUSION 2 LEVELS;  Surgeon: Floyce Stakes, MD;  Location: [facility];  Service: Neurosurgery;  Laterality: N/A;  C4-5 C5-6 Anterior cervical decompression/diskectomy/fusion  . Cardiac catheterization  09/18/2013  . Left heart catheterization with coronary angiogram N/A 09/18/2013    Procedure: LEFT HEART CATHETERIZATION WITH CORONARY ANGIOGRAM;  Surgeon: Blane Ohara, MD;  Location: [facility] CATH LAB;  Service: Cardiovascular;  Laterality: N/A;    There were no vitals filed for this visit.  Visit Diagnosis:  Difficulty in walking involving pelvis  Pelvic joint pain, left  Instability of pelvis or thigh joint      Subjective Assessment - 06/21/14 1022    Subjective Walking with increased weightbearing today.  Less pain after soft tissue work, no bruising. Would like a list of exercises (stability) for home reference.    Currently in Pain? Yes   Pain Score 7    Pain Location Sacrum   Pain Orientation Left   Pain  Descriptors / Indicators Aching   Pain Type Chronic pain   Pain Onset More than a month ago      Pilates Reformer used for LE/core strength, postural strength, lumbopelvic disassociation and core control.  Exercises included: Footwork 2 Red and 1 Blue: parallel heels, forefoot and with turnout (V) double leg Heel Raises double x 10  Single leg press out 2 Red 1 Blue, each side x 10, added small mid range pulse for endurance Bridging all springs with ball x 10, good articulation of lumbar spine with descent, cues for rolling up and breathing pattern   Also performed stability ex with ball under sacrum as last visit to review, gave bulleted items to patient for HEP  Clam uni/bilat. (feet on footbar) SLR from footbar x 10 each LE, cues for breathing.  Reversed to aid in maintaining ribcage,  encouraged lateral vs belly breathing March (femur arc) x 10             [facility] Adult PT Treatment/Exercise - 06/21/14 1122    Manual Therapy   Manual Therapy Soft tissue mobilization   Manual therapy comments used Roc Blade   Soft tissue mobilization L piriformis, deep pressure to L gr troch attachments, less painful today                PT Education - 06/21/14 1034    Education provided Yes   Education Details pilates Reformer, stab ex   Person(s) Educated Patient   Methods Explanation;Demonstration;Handout   Comprehension Verbalized understanding;Returned demonstration;Need further instruction          PT Short Term Goals - 06/19/14 1025    PT SHORT TERM GOAL #1   Title Pt will be able to walk with 50% weightbearing on LLE and LRAD if needed    Baseline 25-40%   Status On-going   PT SHORT TERM GOAL #2   Title Pt will be I with initial HEP   Status Achieved   PT SHORT TERM GOAL #3   Title Pt will be able to sit for 15-20 min with equal weighbearing for meals, appts.    Baseline 10 min    Status On-going   PT SHORT TERM GOAL #4   Title Pt will be I with concepts fo RICE, posture and body mechanics   Status Achieved           PT Long Term Goals - 06/19/14 1029    PT LONG TERM GOAL #1   Title Pt will be I with advanced HEP   Status On-going   PT LONG TERM GOAL #2   Title Pt will be able to walk without LRAD and occ min pain   Status On-going   PT LONG TERM GOAL #3   Title Pt wil be able to do light housework with occ min pain   Baseline limits, son helps with laundry carrying, has someone to clean her house   Status Partially Met   PT LONG TERM GOAL #4   Title Pt will be score 50% or less to demo functional improvement.    Status On-going   PT LONG TERM GOAL #5   Title Pt will be able to sit with equal WB and min pain as needed for driving, meals   Status On-going               Plan - 06/21/14 1112    Clinical Impression Statement  Demonstrates increased WB today.  Painfree exercise today in supine on Reformer.  LE weakness and fatigue noted with simple stability ex. Less painful with soft tissue to Lt hip today.    PT Next Visit Plan stabilizaton and eccentric exercise in prone, use Roc, try Reformer   PT Home Exercise Plan  eccentric hip exercises with PT or assistance.  Continueing strength with Guido Sander   Consulted and Agree with Plan of Care Patient        Problem List Patient Active Problem List   Diagnosis Date Noted  . Orthostatic hypotension 12/07/2013  . Adrenal insufficiency 09/25/2013  . Palpitations 09/16/2013  . Cervical stenosis of spinal canal 08/22/2013  . Lumbar degenerative disc disease 12/30/2012  . Tear of lateral meniscus of right knee, current   . Osteopenia   . Lumbar back pain 09/13/2012  . Subjective visual disturbance 07/14/2012  . Endometriosis   . Endometrial polyp   . IBS (irritable bowel syndrome)   . Acid reflux   . Colitis   . Behcet's syndrome   . Glucose intolerance (impaired glucose tolerance) 12/19/2010  . Behcet's disease 01/06/2004    Satia Winger 06/21/2014, 11:28 AM  [facility] [address] [place], [place], [address] Phone: [phone]   Fax:  [phone]   Raeford Razor, PT 06/21/2014 11:28 AM Phone: [phone] Fax: [phone]

## 2014-06-25 ENCOUNTER — Ambulatory Visit: Payer: 59 | Admitting: Physical Therapy

## 2014-06-25 DIAGNOSIS — M25359 Other instability, unspecified hip: Secondary | ICD-10-CM

## 2014-06-25 DIAGNOSIS — M25552 Pain in left hip: Secondary | ICD-10-CM

## 2014-06-25 DIAGNOSIS — R262 Difficulty in walking, not elsewhere classified: Secondary | ICD-10-CM

## 2014-06-25 NOTE — Therapy (Signed)
[place], [place], [address] Phone: [phone]   Fax:  [phone]  Physical Therapy Treatment  Patient Details  Name: [name] MRN: [id number] Date of Birth: 04-Jan-1961 Referring Provider:  Foye Spurling, MD  Encounter Date: 06/25/2014      PT End of Session - 06/25/14 0950    Visit Number 9   Number of Visits 16   Date for PT Re-Evaluation 07/16/14   PT Start Time 4193   Activity Tolerance Patient tolerated treatment well      Past Medical History  Diagnosis Date  . IBS (irritable bowel syndrome)   . Acid reflux   . Colitis   . Behcet's syndrome   . GERD (gastroesophageal reflux disease)   . Uveitis   . Adrenal insufficiency   . Subjective visual disturbance 07/14/2012  . Tear of lateral meniscus of right knee, current     Tear of lateral & medial meniscus of right knee  . Asthma   . PONV (postoperative nausea and vomiting)     also low blood pressure at times  . Pneumonia     20 years ago  . Refusal of blood transfusions as patient is Jehovah's Witness   . Chronic back pain   . Chronic neck pain   . Osteoporosis     T score reported -2.7    Past Surgical History  Procedure Laterality Date  . Cholecystectomy  12/2004  . Pelvic laparoscopy      DL  . Dilation and curettage of uterus    . Hysteroscopy    . Cesarean section  03/2001  . Knee surgery Left 05/1998  . Hand surgery    . Breast surgery      Cysts excised  . Oophorectomy      BSO  . Vaginal hysterectomy  07/2007    LAVH BSO  . Back surgery    . Knee arthroscopy with lateral menisectomy Right 12/12/2012    Procedure: KNEE ARTHROSCOPY WITH LATERAL MENISECTOMY;  Surgeon: Lorn Junes, MD;  Location: [facility];  Service: Orthopedics;  Laterality: Right;  . Thyroid cyst excision    . Anterior fusion cervical spine  08/22/2013    c4 5  6         . Anterior cervical decomp/discectomy fusion N/A  08/22/2013    Procedure: ANTERIOR CERVICAL DECOMPRESSION/DISCECTOMY FUSION 2 LEVELS;  Surgeon: Floyce Stakes, MD;  Location: [facility];  Service: Neurosurgery;  Laterality: N/A;  C4-5 C5-6 Anterior cervical decompression/diskectomy/fusion  . Cardiac catheterization  09/18/2013  . Left heart catheterization with coronary angiogram N/A 09/18/2013    Procedure: LEFT HEART CATHETERIZATION WITH CORONARY ANGIOGRAM;  Surgeon: Blane Ohara, MD;  Location: [facility] CATH LAB;  Service: Cardiovascular;  Laterality: N/A;    There were no vitals filed for this visit.  Visit Diagnosis:  Difficulty in walking involving pelvis  Pelvic joint pain, left  Instability of pelvis or thigh joint      Subjective Assessment - 06/25/14 0940    Subjective Pain is 8/10. Went to a 3 day convention this weekend but it was too much for my back.  Wearing a wrist brace on Rt. hand. Feels depressed.  Another injection Friday before insurance runs out.     Currently in Pain? Yes   Pain Score 8    Pain Location Sacrum   Pain Orientation Left   Pain Descriptors / Indicators Sharp   Pain Type Chronic  pain   Pain Radiating Towards usually does not radiate            [facility] PT Assessment - 06/25/14 0956    Strength   Right Hip Extension 4+/5   Right Hip ABduction 4+/5   Left Hip Extension 3+/5            [facility] Adult PT Treatment/Exercise - 06/25/14 0944    Lumbar Exercises: Supine   Ab Set 10 reps;5 seconds   AB Set Limitations ball squeeze   Glut Set 10 reps   Clam 20 reps   Clam Limitations green band, 1 x 10 unilateral   reversed breathing due to abdominal breathing   Bridge 10 reps   Other Supine Lumbar Exercises pelvic tilt x 10   Lumbar Exercises: Sidelying   Clam 10 reps   Hip Abduction 10 reps   Lumbar Exercises: Prone   Straight Leg Raise 10 reps   Other Prone Lumbar Exercises bent knee lift x 10 each   Other Prone Lumbar Exercises can lift LE higher with manual compression to SIJ  bilaterally   Cryotherapy   Number Minutes Cryotherapy 15 Minutes   Cryotherapy Location Lumbar Spine   Type of Cryotherapy Ice pack   Electrical Stimulation   Electrical Stimulation Location Left SI. Left glute   Electrical Stimulation Action IFC   Electrical Stimulation Parameters 29   Electrical Stimulation Goals Pain                PT Education - 06/25/14 1004    Education provided No          PT Short Term Goals - 06/25/14 0950    PT SHORT TERM GOAL #1   Title Pt will be able to walk with 50% weightbearing on LLE and LRAD if needed    Status On-going   PT SHORT TERM GOAL #2   Title Pt will be I with initial HEP   Status On-going   PT SHORT TERM GOAL #3   Title Pt will be able to sit for 15-20 min with equal weighbearing for meals, appts.    Status On-going   PT SHORT TERM GOAL #4   Title Pt will be I with concepts fo RICE, posture and body mechanics   Status On-going           PT Long Term Goals - 06/25/14 0950    PT LONG TERM GOAL #1   Title Pt will be I with advanced HEP   Status On-going   PT LONG TERM GOAL #2   Title Pt will be able to walk without LRAD and occ min pain   Status On-going   PT LONG TERM GOAL #3   Title Pt wil be able to do light housework with occ min pain   Status Partially Met   PT LONG TERM GOAL #4   Status Unable to assess   PT LONG TERM GOAL #5   Title Pt will be able to sit with equal WB and min pain as needed for driving, meals   Status On-going               Plan - 06/25/14 0951    Clinical Impression Statement Weekend activities set her back a bit.  Glute and hip abd strength has improved!    PT Next Visit Plan cont stab ex, piriformis, manual and modalities   PT Home Exercise Plan stab, piriformis and clam    Consulted and Agree with Plan of  Care Patient        Problem List Patient Active Problem List   Diagnosis Date Noted  . Orthostatic hypotension 12/07/2013  . Adrenal insufficiency 09/25/2013   . Palpitations 09/16/2013  . Cervical stenosis of spinal canal 08/22/2013  . Lumbar degenerative disc disease 12/30/2012  . Tear of lateral meniscus of right knee, current   . Osteopenia   . Lumbar back pain 09/13/2012  . Subjective visual disturbance 07/14/2012  . Endometriosis   . Endometrial polyp   . IBS (irritable bowel syndrome)   . Acid reflux   . Colitis   . Behcet's syndrome   . Glucose intolerance (impaired glucose tolerance) 12/19/2010  . Behcet's disease 01/06/2004    Pearley Millington 06/25/2014, 10:15 AM  [facility] [address] [place], [place], [address] Phone: [phone]   Fax:  [phone]    Raeford Razor, PT 06/25/2014 10:16 AM Phone: [phone] Fax: [phone]

## 2014-06-27 ENCOUNTER — Encounter (HOSPITAL_COMMUNITY)
Admission: RE | Admit: 2014-06-27 | Discharge: 2014-06-27 | Disposition: A | Discharge status: Home / Self Care | Payer: 59 | Source: Ambulatory Visit | Attending: Internal Medicine | Admitting: Internal Medicine

## 2014-06-27 ENCOUNTER — Encounter (HOSPITAL_COMMUNITY): Payer: Self-pay

## 2014-06-27 DIAGNOSIS — M352 Behcet's disease: Secondary | ICD-10-CM | POA: Diagnosis not present

## 2014-06-27 MED ORDER — SODIUM CHLORIDE 0.9 % IV SOLN
5.0000 mg/kg | Freq: Once | INTRAVENOUS | Status: AC
  Administered 2014-06-27: 400 mg via INTRAVENOUS
  Filled 2014-06-27: qty 40

## 2014-06-27 MED ORDER — SODIUM CHLORIDE 0.9 % IV SOLN
INTRAVENOUS | Status: DC
  Administered 2014-06-27: 11:00:00 via INTRAVENOUS

## 2014-06-27 MED ORDER — DIPHENHYDRAMINE HCL 25 MG PO TABS
25.0000 mg | ORAL_TABLET | Freq: Once | ORAL | Status: AC
  Administered 2014-06-27: 25 mg via ORAL
  Filled 2014-06-27 (×2): qty 1

## 2014-06-27 MED ORDER — ACETAMINOPHEN 325 MG PO TABS
650.0000 mg | ORAL_TABLET | Freq: Once | ORAL | Status: AC
  Administered 2014-06-27: 650 mg via ORAL
  Filled 2014-06-27: qty 2

## 2014-06-27 NOTE — Discharge Instructions (Signed)
Infliximab injection °What is this medicine? °INFLIXIMAB (in FLIX i mab) is used to treat Crohn's disease and ulcerative colitis. It is also used to treat ankylosing spondylitis, psoriasis, and some forms of arthritis. °This medicine may be used for other purposes; ask your health care provider or pharmacist if you have questions. °COMMON BRAND NAME(S): Remicade °What should I tell my health care provider before I take this medicine? °They need to know if you have any of these conditions: °-diabetes °-exposure to tuberculosis °-heart failure °-hepatitis or liver disease °-immune system problems °-infection °-lung or breathing disease, like COPD °-multiple sclerosis °-current or past resident of [place] °-seizure disorder °-an unusual or allergic reaction to infliximab, mouse proteins, other medicines, foods, dyes, or preservatives °-pregnant or trying to get pregnant °-breast-feeding °How should I use this medicine? °This medicine is for injection into a vein. It is usually given by a health care professional in a hospital or clinic setting. °A special MedGuide will be given to you by the pharmacist with each prescription and refill. Be sure to read this information carefully each time. °Talk to your pediatrician regarding the use of this medicine in children. Special care may be needed. °Overdosage: If you think you have taken too much of this medicine contact a poison control center or emergency room at once. °NOTE: This medicine is only for you. Do not share this medicine with others. °What if I miss a dose? °It is important not to miss your dose. Call your doctor or health care professional if you are unable to keep an appointment. °What may interact with this medicine? °Do not take this medicine with any of the following medications: °-anakinra °-rilonacept °This medicine may also interact with the following medications: °-vaccines °This list may not describe all possible interactions.  Give your health care provider a list of all the medicines, herbs, non-prescription drugs, or dietary supplements you use. Also tell them if you smoke, drink alcohol, or use illegal drugs. Some items may interact with your medicine. °What should I watch for while using this medicine? °Visit your doctor or health care professional for regular checks on your progress. °If you get a cold or other infection while receiving this medicine, call your doctor or health care professional. Do not treat yourself. This medicine may decrease your body's ability to fight infections. Before beginning therapy, your doctor may do a test to see if you have been exposed to tuberculosis. °This medicine may make the symptoms of heart failure worse in some patients. If you notice symptoms such as increased shortness of breath or swelling of the ankles or legs, contact your health care provider right away. °If you are going to have surgery or dental work, tell your health care professional or dentist that you have received this medicine. °If you take this medicine for plaque psoriasis, stay out of the sun. If you cannot avoid being in the sun, wear protective clothing and use sunscreen. Do not use sun lamps or tanning beds/booths. °What side effects may I notice from receiving this medicine? °Side effects that you should report to your doctor or health care professional as soon as possible: °-allergic reactions like skin rash, itching or hives, swelling of the face, lips, or tongue °-chest pain °-fever or chills, usually related to the infusion °-muscle or joint pain °-red, scaly patches or raised bumps on the skin °-signs of infection - fever or chills, cough, sore throat, pain or difficulty passing urine °-swollen lymph nodes   in the neck, underarm, or groin areas °-unexplained weight loss °-unusual bleeding or bruising °-unusually weak or tired °-yellowing of the eyes or skin °Side effects that usually do not require medical attention  (report to your doctor or health care professional if they continue or are bothersome): °-headache °-heartburn or stomach pain °-nausea, vomiting °This list may not describe all possible side effects. Call your doctor for medical advice about side effects. You may report side effects to FDA at 1-800-FDA-1088. °Where should I keep my medicine? °This drug is given in a hospital or clinic and will not be stored at home. °NOTE: This sheet is a summary. It may not cover all possible information. If you have questions about this medicine, talk to your doctor, pharmacist, or health care provider. °© 2015, Elsevier/Gold Standard. (2007-08-10 10:26:02) ° °

## 2014-06-28 ENCOUNTER — Ambulatory Visit: Payer: 59 | Admitting: Physical Therapy

## 2014-06-28 DIAGNOSIS — M25552 Pain in left hip: Secondary | ICD-10-CM

## 2014-06-28 DIAGNOSIS — R262 Difficulty in walking, not elsewhere classified: Secondary | ICD-10-CM

## 2014-06-28 DIAGNOSIS — M25359 Other instability, unspecified hip: Secondary | ICD-10-CM

## 2014-06-28 NOTE — Therapy (Signed)
[place], [place], [address] Phone: [phone]   Fax:  [phone]  Physical Therapy Treatment  Patient Details  Name: [name] MRN: [id number] Date of Birth: 03/29/1960 Referring Provider:  Foye Spurling, MD  Encounter Date: 06/28/2014      PT End of Session - 06/28/14 1023    Visit Number 10   Number of Visits 16   Date for PT Re-Evaluation 07/16/14   PT Start Time 1019   PT Stop Time 1114   PT Time Calculation (min) 55 min   Activity Tolerance Patient tolerated treatment well   Behavior During Therapy [facility] for tasks assessed/performed      Past Medical History  Diagnosis Date  . IBS (irritable bowel syndrome)   . Acid reflux   . Colitis   . Behcet's syndrome   . GERD (gastroesophageal reflux disease)   . Uveitis   . Adrenal insufficiency   . Subjective visual disturbance 07/14/2012  . Tear of lateral meniscus of right knee, current     Tear of lateral & medial meniscus of right knee  . Asthma   . PONV (postoperative nausea and vomiting)     also low blood pressure at times  . Pneumonia     20 years ago  . Refusal of blood transfusions as patient is Jehovah's Witness   . Chronic back pain   . Chronic neck pain   . Osteoporosis     T score reported -2.7    Past Surgical History  Procedure Laterality Date  . Cholecystectomy  12/2004  . Pelvic laparoscopy      DL  . Dilation and curettage of uterus    . Hysteroscopy    . Cesarean section  03/2001  . Knee surgery Left 05/1998  . Hand surgery    . Breast surgery      Cysts excised  . Oophorectomy      BSO  . Vaginal hysterectomy  07/2007    LAVH BSO  . Back surgery    . Knee arthroscopy with lateral menisectomy Right 12/12/2012    Procedure: KNEE ARTHROSCOPY WITH LATERAL MENISECTOMY;  Surgeon: Lorn Junes, MD;  Location: [facility];  Service: Orthopedics;  Laterality: Right;  . Thyroid cyst excision     . Anterior fusion cervical spine  08/22/2013    c4 5  6         . Anterior cervical decomp/discectomy fusion N/A 08/22/2013    Procedure: ANTERIOR CERVICAL DECOMPRESSION/DISCECTOMY FUSION 2 LEVELS;  Surgeon: Floyce Stakes, MD;  Location: [facility];  Service: Neurosurgery;  Laterality: N/A;  C4-5 C5-6 Anterior cervical decompression/diskectomy/fusion  . Cardiac catheterization  09/18/2013  . Left heart catheterization with coronary angiogram N/A 09/18/2013    Procedure: LEFT HEART CATHETERIZATION WITH CORONARY ANGIOGRAM;  Surgeon: Blane Ohara, MD;  Location: [facility] CATH LAB;  Service: Cardiovascular;  Laterality: N/A;    There were no vitals filed for this visit.  Visit Diagnosis:  Difficulty in walking involving pelvis  Pelvic joint pain, left  Instability of pelvis or thigh joint      Subjective Assessment - 06/28/14 1024    Subjective Patient smiling today! Had her Remacade shot and is feeling much better! 4/10 pain. Bought a Tai Chi tape.    Currently in Pain? Yes   Pain Score 4    Pain Location Sacrum   Pain Orientation Left  Pilates Reformer used for LE/core strength, postural strength, lumbopelvic disassociation and core control.  Exercises included: Footwork 2 red 1 Blue,  Heels, Forefoot, parallel and V, heel raises Bridging all springs x 6, clam in bridge x 8 Supine Arm work 1 red Arcs and T  And circles  Feet in Straps 1 Red 1 yellow arcs parallel, V and circles Quadruped 1 Red  UE x 8 and LE x 8 Knee stretches x 10   Patient feels she got a good workout, no increase in pain.          PT Short Term Goals - 06/28/14 1027    PT SHORT TERM GOAL #1   Title Pt will be able to walk with 50% weightbearing on LLE and LRAD if needed    Status Achieved   PT SHORT TERM GOAL #2   Title Pt will be I with initial HEP   Status Achieved   PT SHORT TERM GOAL #3   Title Pt will be able to sit for 15-20 min with equal weighbearing for meals, appts.    Status  On-going   PT SHORT TERM GOAL #4   Title Pt will be I with concepts fo RICE, posture and body mechanics   Status Achieved           PT Long Term Goals - 06/28/14 1029    PT LONG TERM GOAL #1   Title Pt will be I with advanced HEP   Status On-going   PT LONG TERM GOAL #2   Title Pt will be able to walk without LRAD and occ min pain   Status On-going   PT LONG TERM GOAL #3   Title Pt wil be able to do light housework with occ min pain   Status Partially Met   PT LONG TERM GOAL #4   Title Pt will be score 50% or less to demo functional improvement.    Baseline improved, 52%   Status Partially Met               Plan - 06/28/14 1026    Clinical Impression Statement Patient tolerated Refomer ex well with the aid of anti-inflammatory shot.  She plans to cont 1 time per week into July with new insurance.  See goals met today.  Working towards consistency with walking and WB.    PT Next Visit Plan reformer   PT Home Exercise Plan stab, piriformis and clam    Consulted and Agree with Plan of Care Patient        Problem List Patient Active Problem List   Diagnosis Date Noted  . Orthostatic hypotension 12/07/2013  . Adrenal insufficiency 09/25/2013  . Palpitations 09/16/2013  . Cervical stenosis of spinal canal 08/22/2013  . Lumbar degenerative disc disease 12/30/2012  . Tear of lateral meniscus of right knee, current   . Osteopenia   . Lumbar back pain 09/13/2012  . Subjective visual disturbance 07/14/2012  . Endometriosis   . Endometrial polyp   . IBS (irritable bowel syndrome)   . Acid reflux   . Colitis   . Behcet's syndrome   . Glucose intolerance (impaired glucose tolerance) 12/19/2010  . Behcet's disease 01/06/2004    Everitt Wenner 06/28/2014, 11:16 AM  [facility] [address] [place], [place], [address] Phone: [phone]   Fax:  [phone]   Raeford Razor, PT 06/28/2014 11:18 AM Phone:  [phone] Fax: [phone]

## 2014-07-02 ENCOUNTER — Ambulatory Visit: Payer: 59 | Admitting: Physical Therapy

## 2014-07-02 DIAGNOSIS — M25552 Pain in left hip: Secondary | ICD-10-CM | POA: Diagnosis not present

## 2014-07-02 DIAGNOSIS — M25359 Other instability, unspecified hip: Secondary | ICD-10-CM

## 2014-07-02 DIAGNOSIS — R262 Difficulty in walking, not elsewhere classified: Secondary | ICD-10-CM

## 2014-07-02 NOTE — Therapy (Signed)
[place], [place], [address] Phone: [phone]   Fax:  [phone]  Physical Therapy Treatment  Patient Details  Name: IZZIE [name] MRN: [id number] Date of Birth: [birth year] Referring Provider:  Foye Spurling, MD  Encounter Date: 07/02/2014      PT End of Session - 07/02/14 1014    Visit Number 11   Number of Visits 16   Date for PT Re-Evaluation 07/16/14   PT Start Time 0933   PT Stop Time 1014   PT Time Calculation (min) 41 min   Activity Tolerance Patient tolerated treatment well   Behavior During Therapy [facility] for tasks assessed/performed      Past Medical History  Diagnosis Date  . IBS (irritable bowel syndrome)   . Acid reflux   . Colitis   . Behcet's syndrome   . GERD (gastroesophageal reflux disease)   . Uveitis   . Adrenal insufficiency   . Subjective visual disturbance 07/14/2012  . Tear of lateral meniscus of right knee, current     Tear of lateral & medial meniscus of right knee  . Asthma   . PONV (postoperative nausea and vomiting)     also low blood pressure at times  . Pneumonia     20 years ago  . Refusal of blood transfusions as patient is Jehovah's Witness   . Chronic back pain   . Chronic neck pain   . Osteoporosis     T score reported -2.7    Past Surgical History  Procedure Laterality Date  . Cholecystectomy  12/2004  . Pelvic laparoscopy      DL  . Dilation and curettage of uterus    . Hysteroscopy    . Cesarean section  03/2001  . Knee surgery Left 05/1998  . Hand surgery    . Breast surgery      Cysts excised  . Oophorectomy      BSO  . Vaginal hysterectomy  07/2007    LAVH BSO  . Back surgery    . Knee arthroscopy with lateral menisectomy Right 12/12/2012    Procedure: KNEE ARTHROSCOPY WITH LATERAL MENISECTOMY;  Surgeon: Lorn Junes, MD;  Location: [facility];  Service: Orthopedics;  Laterality: Right;  . Thyroid cyst excision     . Anterior fusion cervical spine  08/22/2013    c4 5  6         . Anterior cervical decomp/discectomy fusion N/A 08/22/2013    Procedure: ANTERIOR CERVICAL DECOMPRESSION/DISCECTOMY FUSION 2 LEVELS;  Surgeon: Floyce Stakes, MD;  Location: [facility];  Service: Neurosurgery;  Laterality: N/A;  C4-5 C5-6 Anterior cervical decompression/diskectomy/fusion  . Cardiac catheterization  09/18/2013  . Left heart catheterization with coronary angiogram N/A 09/18/2013    Procedure: LEFT HEART CATHETERIZATION WITH CORONARY ANGIOGRAM;  Surgeon: Blane Ohara, MD;  Location: [facility] CATH LAB;  Service: Cardiovascular;  Laterality: N/A;    There were no vitals filed for this visit.  Visit Diagnosis:  Difficulty in walking involving pelvis  Pelvic joint pain, left  Instability of pelvis or thigh joint      Subjective Assessment - 07/02/14 0935    Subjective No pain this morning until she tried to see how she could do walking without crutch.  She can do it but pain increased by the time she got to the waiting area.  No pain on Recumbant bike.    Currently in Pain? No/denies  [facility] Adult PT Treatment/Exercise - 07/02/14 0938    Lumbar Exercises: Aerobic   Stationary Bike level 2, 5 min    Lumbar Exercises: Supine   Clam 20 reps   Clam Limitations unilat x 10 and bilat.   Heel Slides 10 reps   Bent Knee Raise 10 reps   Dead Bug 10 reps   Straight Leg Raise 10 reps   Other Supine Lumbar Exercises all stab ex done on foam roller     Other Supine Lumbar Exercises UE exercises red band horiz abd and diagonal pull red   Lumbar Exercises: Sidelying   Hip Abduction 10 reps   Other Sidelying Lumbar Exercises Pilates sidekicks and circles x8    Lumbar Exercises: Quadruped   Opposite Arm/Leg Raise Right arm/Left leg;Left arm/Right leg;10 reps   Opposite Arm/Leg Raise Limitations modified for R wrist pain   Other Quadruped Lumbar Exercises quadruped plank    Other Quadruped Lumbar  Exercises childs pose for rest in between    Iontophoresis   Type of Iontophoresis Dexamethasone   Location L Prox SI border   Dose 52m/dL   Time patch            PT Education - 07/02/14 1013    Education provided Yes   Education Details foam roller ex, HEP, ionto   Person(s) Educated Patient   Methods Explanation   Comprehension Verbalized understanding          PT Short Term Goals - 07/02/14 1014    PT SHORT TERM GOAL #1   Title Pt will be able to walk with 50% weightbearing on LLE and LRAD if needed    Status Achieved   PT SHORT TERM GOAL #2   Title Pt will be I with initial HEP   Status Achieved   PT SHORT TERM GOAL #3   Title Pt will be able to sit for 15-20 min with equal weighbearing for meals, appts.    Status Achieved   PT SHORT TERM GOAL #4   Title Pt will be I with concepts fo RICE, posture and body mechanics   Status Achieved           PT Long Term Goals - 07/02/14 1014    PT LONG TERM GOAL #1   Title Pt will be I with advanced HEP   Status On-going   PT LONG TERM GOAL #2   Title Pt will be able to walk without LRAD and occ min pain   Status On-going   PT LONG TERM GOAL #3   Title Pt wil be able to do light housework with occ min pain   Status Partially Met   PT LONG TERM GOAL #4   Title Pt will be score 50% or less to demo functional improvement.    Status Partially Met   PT LONG TERM GOAL #5   Title Pt will be able to sit with equal WB and min pain as needed for driving, meals   Status On-going               Problem List Patient Active Problem List   Diagnosis Date Noted  . Orthostatic hypotension 12/07/2013  . Adrenal insufficiency 09/25/2013  . Palpitations 09/16/2013  . Cervical stenosis of spinal canal 08/22/2013  . Lumbar degenerative disc disease 12/30/2012  . Tear of lateral meniscus of right knee, current   . Osteopenia   . Lumbar back pain 09/13/2012  . Subjective visual disturbance 07/14/2012  . Endometriosis    .  Endometrial polyp   . IBS (irritable bowel syndrome)   . Acid reflux   . Colitis   . Behcet's syndrome   . Glucose intolerance (impaired glucose tolerance) 12/19/2010  . Behcet's disease 01/06/2004    PAA,JENNIFER 07/02/2014, 10:15 AM  [facility] [address] [place], [place], [address] Phone: [phone]   Fax:  [phone] Raeford Razor, PT 07/02/2014 12:19 PM Phone: [phone] Fax: [phone]

## 2014-07-05 ENCOUNTER — Ambulatory Visit: Payer: 59 | Admitting: Physical Therapy

## 2014-07-05 DIAGNOSIS — M25552 Pain in left hip: Secondary | ICD-10-CM | POA: Diagnosis not present

## 2014-07-05 DIAGNOSIS — M25359 Other instability, unspecified hip: Secondary | ICD-10-CM

## 2014-07-05 DIAGNOSIS — R262 Difficulty in walking, not elsewhere classified: Secondary | ICD-10-CM

## 2014-07-05 NOTE — Therapy (Signed)
[place], [place], [address] Phone: [phone]   Fax:  [phone]  Physical Therapy Treatment  Patient Details  Name: [name] MRN: [id number] Date of Birth: 1960/08/06 Referring Provider:  Foye Spurling, MD  Encounter Date: 07/05/2014      PT End of Session - 07/05/14 1358    Visit Number 12   Number of Visits 16   Date for PT Re-Evaluation 07/16/14   PT Start Time 1020   PT Stop Time 1130   PT Time Calculation (min) 70 min   Activity Tolerance Patient tolerated treatment well   Behavior During Therapy [facility] for tasks assessed/performed      Past Medical History  Diagnosis Date  . IBS (irritable bowel syndrome)   . Acid reflux   . Colitis   . Behcet's syndrome   . GERD (gastroesophageal reflux disease)   . Uveitis   . Adrenal insufficiency   . Subjective visual disturbance 07/14/2012  . Tear of lateral meniscus of right knee, current     Tear of lateral & medial meniscus of right knee  . Asthma   . PONV (postoperative nausea and vomiting)     also low blood pressure at times  . Pneumonia     20 years ago  . Refusal of blood transfusions as patient is Jehovah's Witness   . Chronic back pain   . Chronic neck pain   . Osteoporosis     T score reported -2.7    Past Surgical History  Procedure Laterality Date  . Cholecystectomy  12/2004  . Pelvic laparoscopy      DL  . Dilation and curettage of uterus    . Hysteroscopy    . Cesarean section  03/2001  . Knee surgery Left 05/1998  . Hand surgery    . Breast surgery      Cysts excised  . Oophorectomy      BSO  . Vaginal hysterectomy  07/2007    LAVH BSO  . Back surgery    . Knee arthroscopy with lateral menisectomy Right 12/12/2012    Procedure: KNEE ARTHROSCOPY WITH LATERAL MENISECTOMY;  Surgeon: Lorn Junes, MD;  Location: [facility];  Service: Orthopedics;  Laterality: Right;  . Thyroid cyst excision     . Anterior fusion cervical spine  08/22/2013    c4 5  6         . Anterior cervical decomp/discectomy fusion N/A 08/22/2013    Procedure: ANTERIOR CERVICAL DECOMPRESSION/DISCECTOMY FUSION 2 LEVELS;  Surgeon: Floyce Stakes, MD;  Location: [facility];  Service: Neurosurgery;  Laterality: N/A;  C4-5 C5-6 Anterior cervical decompression/diskectomy/fusion  . Cardiac catheterization  09/18/2013  . Left heart catheterization with coronary angiogram N/A 09/18/2013    Procedure: LEFT HEART CATHETERIZATION WITH CORONARY ANGIOGRAM;  Surgeon: Blane Ohara, MD;  Location: [facility];  Service: Cardiovascular;  Laterality: N/A;    There were no vitals filed for this visit.  Visit Diagnosis:  Difficulty in walking involving pelvis  Pelvic joint pain, left  Instability of pelvis or thigh joint      Subjective Assessment - 07/05/14 1023    Subjective Sore in Rt. shoulder, pain in neck today.  Pt frustrated with slow progress.  L Hip hurting today due to doing lifting, carrying items in pantry, was not using crutch.  Did not hurt at the time but could barely walk the next day.  Was putting full weight  on it the whole time.     Currently in Pain? Yes   Pain Score 5    Pain Location Sacrum   Pain Orientation Left   Pain Descriptors / Indicators Sharp   Pain Type Chronic pain   Pain Onset More than a month ago   Pain Frequency Intermittent   Aggravating Factors  weightbearing, quick movements   Pain Relieving Factors doing her HEP, back brace (only uses in the car if sitting long periods)   Effect of Pain on Daily Activities wants to be able to walk normally                         [facility] Adult PT Treatment/Exercise - 07/05/14 1029    Lumbar Exercises: Supine   Clam 10 reps   Clam Limitations on foam bi and uni   Bent Knee Raise 10 reps   Dead Bug 10 reps   Bridge 10 reps   Bridge Limitations on ball 2 sets    Other Supine Lumbar Exercises LTR with ball for obliques,  small ROM   Other Supine Lumbar Exercises added stab ex to bridge    Iontophoresis   Type of Iontophoresis Dexamethasone   Location L Prox SI border   Dose 21m/dL   Time patch                PT Education - 07/05/14 1358    Education provided No          PT Short Term Goals - 07/02/14 1014    PT SHORT TERM GOAL #1   Title Pt will be able to walk with 50% weightbearing on LLE and LRAD if needed    Status Achieved   PT SHORT TERM GOAL #2   Title Pt will be I with initial HEP   Status Achieved   PT SHORT TERM GOAL #3   Title Pt will be able to sit for 15-20 min with equal weighbearing for meals, appts.    Status Achieved   PT SHORT TERM GOAL #4   Title Pt will be I with concepts fo RICE, posture and body mechanics   Status Achieved           PT Long Term Goals - 07/02/14 1014    PT LONG TERM GOAL #1   Title Pt will be I with advanced HEP   Status On-going   PT LONG TERM GOAL #2   Title Pt will be able to walk without LRAD and occ min pain   Status On-going   PT LONG TERM GOAL #3   Title Pt wil be able to do light housework with occ min pain   Status Partially Met   PT LONG TERM GOAL #4   Title Pt will be score 50% or less to demo functional improvement.    Status Partially Met   PT LONG TERM GOAL #5   Title Pt will be able to sit with equal WB and min pain as needed for driving, meals   Status On-going               Plan - 07/05/14 1359    Clinical Impression Statement Patient cont with Lt sided instability of SIJ, she is trying to test herself, not using crutch at times and has no pain until after!    PT Next Visit Plan stab with swiss ball, Reformer, cont. ionto/manual   PT Home Exercise Plan stab, piriformis and clam, bridge (  has full HEP)    Consulted and Agree with Plan of Care Patient     May change to 1 time per week due to lack of insurance coverage    Problem List Patient Active Problem List   Diagnosis Date Noted  . Orthostatic  hypotension 12/07/2013  . Adrenal insufficiency 09/25/2013  . Palpitations 09/16/2013  . Cervical stenosis of spinal canal 08/22/2013  . Lumbar degenerative disc disease 12/30/2012  . Tear of lateral meniscus of right knee, current   . Osteopenia   . Lumbar back pain 09/13/2012  . Subjective visual disturbance 07/14/2012  . Endometriosis   . Endometrial polyp   . IBS (irritable bowel syndrome)   . Acid reflux   . Colitis   . Behcet's syndrome   . Glucose intolerance (impaired glucose tolerance) 12/19/2010  . Behcet's disease 01/06/2004    PAA,JENNIFER 07/05/2014, 2:04 PM  [facility] [address] [place], [place], [address] Phone: [phone]   Fax:  [phone]  Raeford Razor, PT 07/05/2014 2:04 PM Phone: [phone] Fax: [phone]

## 2014-07-10 ENCOUNTER — Encounter: Payer: 59 | Admitting: Physical Therapy

## 2014-07-12 ENCOUNTER — Encounter: Payer: 59 | Admitting: Physical Therapy

## 2014-07-16 ENCOUNTER — Ambulatory Visit: Payer: 59 | Attending: Physical Medicine and Rehabilitation | Admitting: Physical Therapy

## 2014-07-16 DIAGNOSIS — M2480 Other specific joint derangements of unspecified joint, not elsewhere classified: Secondary | ICD-10-CM | POA: Diagnosis present

## 2014-07-16 DIAGNOSIS — M25359 Other instability, unspecified hip: Secondary | ICD-10-CM

## 2014-07-16 DIAGNOSIS — R262 Difficulty in walking, not elsewhere classified: Secondary | ICD-10-CM | POA: Insufficient documentation

## 2014-07-16 DIAGNOSIS — M25552 Pain in left hip: Secondary | ICD-10-CM | POA: Diagnosis present

## 2014-07-16 NOTE — Therapy (Signed)
[facility] [address] [place], [place], [address] Phone: [phone]   Fax:  [phone]  Physical Therapy Treatment/Renewal  Patient Details  Name: [name] MRN: [id number] Date of Birth: 1960-06-19 Referring Provider:  Laurena Slimmer, MD  Encounter Date: 07/16/2014      PT End of Session - 07/16/14 1031    Visit Number 13   Number of Visits 21   Date for PT Re-Evaluation 09/10/14   PT Start Time 1016   PT Stop Time 1100   PT Time Calculation (min) 44 min   Activity Tolerance Patient tolerated treatment well   Behavior During Therapy [facility] for tasks assessed/performed      Past Medical History  Diagnosis Date  . IBS (irritable bowel syndrome)   . Acid reflux   . Colitis   . Behcet's syndrome   . GERD (gastroesophageal reflux disease)   . Uveitis   . Adrenal insufficiency   . Subjective visual disturbance 07/14/2012  . Tear of lateral meniscus of right knee, current     Tear of lateral & medial meniscus of right knee  . Asthma   . PONV (postoperative nausea and vomiting)     also low blood pressure at times  . Pneumonia     20 years ago  . Refusal of blood transfusions as patient is Jehovah's Witness   . Chronic back pain   . Chronic neck pain   . Osteoporosis     T score reported -2.7    Past Surgical History  Procedure Laterality Date  . Cholecystectomy  12/2004  . Pelvic laparoscopy      DL  . Dilation and curettage of uterus    . Hysteroscopy    . Cesarean section  03/2001  . Knee surgery Left 05/1998  . Hand surgery    . Breast surgery      Cysts excised  . Oophorectomy      BSO  . Vaginal hysterectomy  07/2007    LAVH BSO  . Back surgery    . Knee arthroscopy with lateral menisectomy Right 12/12/2012    Procedure: KNEE ARTHROSCOPY WITH LATERAL MENISECTOMY;  Surgeon: Nilda Simmer, MD;  Location: [facility];  Service: Orthopedics;  Laterality: Right;  . Thyroid cyst  excision    . Anterior fusion cervical spine  08/22/2013    c4 5  6         . Anterior cervical decomp/discectomy fusion N/A 08/22/2013    Procedure: ANTERIOR CERVICAL DECOMPRESSION/DISCECTOMY FUSION 2 LEVELS;  Surgeon: Karn Cassis, MD;  Location: [facility];  Service: Neurosurgery;  Laterality: N/A;  C4-5 C5-6 Anterior cervical decompression/diskectomy/fusion  . Cardiac catheterization  09/18/2013  . Left heart catheterization with coronary angiogram N/A 09/18/2013    Procedure: LEFT HEART CATHETERIZATION WITH CORONARY ANGIOGRAM;  Surgeon: Micheline Chapman, MD;  Location: [facility];  Service: Cardiovascular;  Laterality: N/A;    There were no vitals filed for this visit.  Visit Diagnosis:  Difficulty in walking involving pelvis  Pelvic joint pain, left  Instability of pelvis or thigh joint      Subjective Assessment - 07/16/14 1022    Subjective Patient reports being able to walk very short distances without crutches.  Would like to continue 1 time per week.    How long can you sit comfortably? 15 min equal weight on a good chair, difficulty in the car   How long can you stand comfortably? 20-30  on Rt. LE but with equal WB 5-10 min    How long can you walk comfortably? without crutch 100 feet slow and deliberate, with crutch 1 block , stop to rest   Currently in Pain? Yes   Pain Score 4    Pain Location Sacrum   Pain Orientation Left   Pain Descriptors / Indicators Sharp;Aching  sharp with moving    Pain Type Chronic pain   Pain Onset More than a month ago   Pain Frequency Constant   Multiple Pain Sites No            [facility] PT Assessment - 07/16/14 1028    Assessment   Medical Diagnosis L SIJ pain, pelvic instability   Onset Date/Surgical Date 03/26/14   Next MD Visit 06/18/14   Prior Therapy Yes, lumbar and cervical   Observation/Other Assessments   Focus on Therapeutic Outcomes (FOTO)  was 52% 3 visits ago   Strength   Right Hip Flexion 4/5   Right Hip  Extension 5/5   Right Hip ABduction 5/5   Left Hip Flexion 4/5   Left Hip Extension 3+/5   Left Hip ABduction 3+/5   Right Knee Flexion 5/5   Right Knee Extension 5/5   Left Knee Flexion 4+/5   Left Knee Extension 5/5   Palpation   Palpation comment pain more localized to L lateral SIJ                      [facility] Adult PT Treatment/Exercise - 07/16/14 1305    Lumbar Exercises: Standing   Other Standing Lumbar Exercises standing hip ext and abduction for LLE with attn to posture and alignment   Lumbar Exercises: Sidelying   Clam 10 reps   Hip Abduction 10 reps   Other Sidelying Lumbar Exercises sidekicks (hip flex/ext) x 10    Other Sidelying Lumbar Exercises hip IR x 10 each                PT Education - 07/16/14 1301    Education provided Yes   Education Details recommending balancing walking with or without crutches   Person(s) Educated Patient   Methods Explanation   Comprehension Verbalized understanding          PT Short Term Goals - 07/16/14 1043    PT SHORT TERM GOAL #1   Title Pt will be able to walk with 50% weightbearing on LLE and LRAD if needed    Status Achieved   PT SHORT TERM GOAL #2   Title Pt will be I with initial HEP   Status Achieved   PT SHORT TERM GOAL #3   Title Pt will be able to sit for 15-20 min with equal weighbearing for meals, appts.    Status Achieved   PT SHORT TERM GOAL #4   Title Pt will be I with concepts fo RICE, posture and body mechanics   Status Achieved           PT Long Term Goals - 07/16/14 1044    PT LONG TERM GOAL #1   Title Pt will be I with advanced HEP   Time 8   Period Weeks   Status On-going   PT LONG TERM GOAL #2   Title Pt will be able to walk without LRAD and occ min pain in her home   Time 8   Period Weeks   Status Revised   PT LONG TERM GOAL #3   Title Pt  wil be able to do light housework with occ min pain   Status On-going   PT LONG TERM GOAL #4   Title Pt will be score 50%  or less to demo functional improvement.    PT LONG TERM GOAL #5   Title Pt will be able to sit with equal WB and min pain as needed for driving, meals   Status On-going   Additional Long Term Goals   Additional Long Term Goals Yes   PT LONG TERM GOAL #6   Title Pt will be able to walk from parking space (handicap) into clinic without crutches and min difficult.    Time 8   Period Weeks   Status New   PT LONG TERM GOAL #7   Title Pt will be able to walk through grocery store with 50% less pain, difficulty.    Time 8   Period Weeks   Status New               Plan - 07/16/14 1036    Clinical Impression Statement Patient cont to progress towards I with gait, stability in LS spine and LE strength. She will benefit from continued skilled PT to progress and address soft tissue restriction, pain as she gains mobility.    Pt will benefit from skilled therapeutic intervention in order to improve on the following deficits Abnormal gait;Decreased range of motion;Difficulty walking;Postural dysfunction;Decreased endurance;Decreased activity tolerance;Increased fascial restricitons;Pain;Decreased balance;Decreased mobility;Decreased strength;Increased muscle spasms;Hypermobility   Rehab Potential Good   PT Frequency 1x / week   PT Duration 8 weeks   PT Treatment/Interventions ADLs/Self Care Home Management;Moist Heat;Therapeutic activities;Patient/family education;Passive range of motion;Therapeutic exercise;DME Instruction;Ultrasound;Gait training;Balance training;Manual techniques;Dry needling;Neuromuscular re-education;Cryotherapy;Electrical Stimulation;Functional mobility training;Other (comment);Taping;Iontophoresis 4mg /ml Dexamethasone   PT Next Visit Plan Pilates Tower Reformer   PT Home Exercise Plan has full HEP   Consulted and Agree with Plan of Care Patient        Problem List Patient Active Problem List   Diagnosis Date Noted  . Orthostatic hypotension 12/07/2013  . Adrenal  insufficiency 09/25/2013  . Palpitations 09/16/2013  . Cervical stenosis of spinal canal 08/22/2013  . Lumbar degenerative disc disease 12/30/2012  . Tear of lateral meniscus of right knee, current   . Osteopenia   . Lumbar back pain 09/13/2012  . Subjective visual disturbance 07/14/2012  . Endometriosis   . Endometrial polyp   . IBS (irritable bowel syndrome)   . Acid reflux   . Colitis   . Behcet's syndrome   . Glucose intolerance (impaired glucose tolerance) 12/19/2010  . Behcet's disease 01/06/2004    Jaylani Mcguinn 07/16/2014, 1:11 PM  [facility] [address] [place], [place], [address] Phone: [phone]   Fax:  [phone]     Karie Mainland, PT 07/16/2014 1:12 PM Phone: [phone] Fax: [phone]

## 2014-07-19 ENCOUNTER — Encounter: Payer: 59 | Admitting: Physical Therapy

## 2014-07-20 ENCOUNTER — Other Ambulatory Visit (HOSPITAL_COMMUNITY): Payer: Self-pay | Admitting: Internal Medicine

## 2014-07-23 ENCOUNTER — Ambulatory Visit: Payer: 59 | Admitting: Physical Therapy

## 2014-07-23 DIAGNOSIS — M25359 Other instability, unspecified hip: Secondary | ICD-10-CM

## 2014-07-23 DIAGNOSIS — R262 Difficulty in walking, not elsewhere classified: Secondary | ICD-10-CM | POA: Diagnosis not present

## 2014-07-23 DIAGNOSIS — M25552 Pain in left hip: Secondary | ICD-10-CM

## 2014-07-23 NOTE — Therapy (Signed)
[place], [place], [address] Phone: [phone]   Fax:  [phone]  Physical Therapy Treatment  Patient Details  Name: [name] MRN: [id number] Date of Birth: 10/26/1960 Referring Provider:  Foye Spurling, MD  Encounter Date: 07/23/2014      PT End of Session - 07/23/14 1155    Visit Number 14   Number of Visits 21   Date for PT Re-Evaluation 09/10/14   PT Start Time 1150   PT Stop Time 2505   PT Time Calculation (min) 45 min   Activity Tolerance Patient tolerated treatment well      Past Medical History  Diagnosis Date  . IBS (irritable bowel syndrome)   . Acid reflux   . Colitis   . Behcet's syndrome   . GERD (gastroesophageal reflux disease)   . Uveitis   . Adrenal insufficiency   . Subjective visual disturbance 07/14/2012  . Tear of lateral meniscus of right knee, current     Tear of lateral & medial meniscus of right knee  . Asthma   . PONV (postoperative nausea and vomiting)     also low blood pressure at times  . Pneumonia     20 years ago  . Refusal of blood transfusions as patient is Jehovah's Witness   . Chronic back pain   . Chronic neck pain   . Osteoporosis     T score reported -2.7    Past Surgical History  Procedure Laterality Date  . Cholecystectomy  12/2004  . Pelvic laparoscopy      DL  . Dilation and curettage of uterus    . Hysteroscopy    . Cesarean section  03/2001  . Knee surgery Left 05/1998  . Hand surgery    . Breast surgery      Cysts excised  . Oophorectomy      BSO  . Vaginal hysterectomy  07/2007    LAVH BSO  . Back surgery    . Knee arthroscopy with lateral menisectomy Right 12/12/2012    Procedure: KNEE ARTHROSCOPY WITH LATERAL MENISECTOMY;  Surgeon: Lorn Junes, MD;  Location: [facility];  Service: Orthopedics;  Laterality: Right;  . Thyroid cyst excision    . Anterior fusion cervical spine  08/22/2013    c4 5  6          . Anterior cervical decomp/discectomy fusion N/A 08/22/2013    Procedure: ANTERIOR CERVICAL DECOMPRESSION/DISCECTOMY FUSION 2 LEVELS;  Surgeon: Floyce Stakes, MD;  Location: [facility];  Service: Neurosurgery;  Laterality: N/A;  C4-5 C5-6 Anterior cervical decompression/diskectomy/fusion  . Cardiac catheterization  09/18/2013  . Left heart catheterization with coronary angiogram N/A 09/18/2013    Procedure: LEFT HEART CATHETERIZATION WITH CORONARY ANGIOGRAM;  Surgeon: Blane Ohara, MD;  Location: [facility] CATH [facility];  Service: Cardiovascular;  Laterality: N/A;    There were no vitals filed for this visit.  Visit Diagnosis:  Difficulty in walking involving pelvis  Pelvic joint pain, left  Instability of pelvis or thigh joint      Subjective Assessment - 07/23/14 1158    Subjective Ive been trying to walk with the crutch less, i think I flared it up today, 8/10.    Currently in Pain? Yes   Pain Score 8    Pain Location Sacrum   Pain Orientation Left   Pain Descriptors / Indicators Sharp   Pain Type Chronic pain   Pain Onset More than  a month ago   Pain Frequency Intermittent          [facility] Adult PT Treatment/Exercise - 07/23/14 1251    Lumbar Exercises: Standing   Other Standing Lumbar Exercises standing hip work, single leg extension, abd (LLE) difficulty when Lt LE in weightbearing.  Pt reports "translating" the last time exercise to this nstanding vesion at her countertop.    Iontophoresis   Type of Iontophoresis Dexamethasone   Location lateral SI border   Dose 62m/dL   Time patch   Manual Therapy   Manual Therapy Joint mobilization;Soft tissue mobilization;Myofascial release;Passive ROM;Taping   Joint Mobilization L SI compression with hip ER/IR   Soft tissue mobilization L piriformis   Myofascial Release worked on deep hip rotators, painful more distally, used Roc blade and manual (elbow) for static compression to trigger point    Passive ROM L hip IR   Muscle  Energy Technique active Rt. hip ext with L P/A pressure to L sacrum   McConnell "X" to sacrum for stability, tape oriented downward                PT Education - 07/23/14 1256    Education provided Yes   Education Details Mcconnell tape   Person(s) Educated Patient   Methods Explanation   Comprehension Verbalized understanding          PT Short Term Goals - 07/23/14 1259    PT SHORT TERM GOAL #1   Title Pt will be able to walk with 50% weightbearing on LLE and LRAD if needed    Status Achieved   PT SHORT TERM GOAL #2   Title Pt will be I with initial HEP   Status Achieved   PT SHORT TERM GOAL #3   Title Pt will be able to sit for 15-20 min with equal weighbearing for meals, appts.    Status Achieved   PT SHORT TERM GOAL #4   Title Pt will be I with concepts fo RICE, posture and body mechanics   Status Achieved           PT Long Term Goals - 07/23/14 1259    PT LONG TERM GOAL #1   Title Pt will be I with advanced HEP   Status On-going   PT LONG TERM GOAL #2   Title Pt will be able to walk without LRAD and occ min pain in her home   Status On-going   PT LONG TERM GOAL #3   Title Pt wil be able to do light housework with occ min pain   Status On-going   PT LONG TERM GOAL #4   Title Pt will be score 50% or less to demo functional improvement.    Status Partially Met   PT LONG TERM GOAL #5   Title Pt will be able to sit with equal WB and min pain as needed for driving, meals   Status On-going   PT LONG TERM GOAL #6   Title Pt will be able to walk from parking space (handicap) into clinic without crutches and min difficult.    Status On-going   PT LONG TERM GOAL #7   Title Pt will be able to walk through grocery store with 50% less pain, difficulty.    Status On-going               Plan - 07/23/14 1257    Clinical Impression Statement Patient feels like she wants to get rid of crutch but when she  tries she flares up easily.  Would like dry  needling again.  Progressing towards goals.    PT Next Visit Plan Pilates Tower Reformer, dry need?   PT Home Exercise Plan has full HEP   Consulted and Agree with Plan of Care Patient        Problem List Patient Active Problem List   Diagnosis Date Noted  . Orthostatic hypotension 12/07/2013  . Adrenal insufficiency 09/25/2013  . Palpitations 09/16/2013  . Cervical stenosis of spinal canal 08/22/2013  . Lumbar degenerative disc disease 12/30/2012  . Tear of lateral meniscus of right knee, current   . Osteopenia   . Lumbar back pain 09/13/2012  . Subjective visual disturbance 07/14/2012  . Endometriosis   . Endometrial polyp   . IBS (irritable bowel syndrome)   . Acid reflux   . Colitis   . Behcet's syndrome   . Glucose intolerance (impaired glucose tolerance) 12/19/2010  . Behcet's disease 01/06/2004    Annasophia Crocker 07/23/2014, 1:01 PM  [facility] [address] [place], [place], [address] Phone: [phone]   Fax:  [phone]   Raeford Razor, PT 07/23/2014 1:01 PM Phone: [phone] Fax: [phone]

## 2014-07-25 ENCOUNTER — Encounter (HOSPITAL_COMMUNITY)
Admission: RE | Admit: 2014-07-25 | Discharge: 2014-07-25 | Disposition: A | Discharge status: Home / Self Care | Payer: BLUE CROSS/BLUE SHIELD | Source: Ambulatory Visit | Attending: Internal Medicine | Admitting: Internal Medicine

## 2014-07-25 DIAGNOSIS — M352 Behcet's disease: Secondary | ICD-10-CM | POA: Insufficient documentation

## 2014-07-25 MED ORDER — SODIUM CHLORIDE 0.9 % IV SOLN
INTRAVENOUS | Status: DC
  Administered 2014-07-25: 250 mL via INTRAVENOUS

## 2014-07-25 MED ORDER — DIPHENHYDRAMINE HCL 25 MG PO CAPS
25.0000 mg | ORAL_CAPSULE | ORAL | Status: DC
  Administered 2014-07-25: 25 mg via ORAL
  Filled 2014-07-25 (×2): qty 1

## 2014-07-25 MED ORDER — ACETAMINOPHEN 325 MG PO TABS
650.0000 mg | ORAL_TABLET | ORAL | Status: DC
  Administered 2014-07-25: 650 mg via ORAL
  Filled 2014-07-25: qty 2

## 2014-07-25 MED ORDER — INFLIXIMAB 100 MG IV SOLR
5.0000 mg/kg | INTRAVENOUS | Status: DC
  Administered 2014-07-25: 400 mg via INTRAVENOUS
  Filled 2014-07-25: qty 40

## 2014-07-25 NOTE — Progress Notes (Signed)
Pt c/p"slight nausea" I offered to call her dr for this. Pt states she has this on occasion and" just prefers to eat crackers and drink soda"

## 2014-07-25 NOTE — Progress Notes (Signed)
Lamont Snowball RN called Dr Guss Bunde to ask about site where patient had mole removed. Site has serous drainage with no redness. Dr Guss Bunde states okay to infuse remicade.

## 2014-07-25 NOTE — Progress Notes (Signed)
Pt arrives today for week 6 of REMICADE induction. In asking screening questions it was determined she had a mole removed on left shoulder. Site is 1cm in diameter, small amount serous drainage on bandage. Pt is not on ABx and is not febrile and her follow-up appt is in 3 months. Placed call to Dr Sterling Big her and left her voice message to call back to determine if she can receive infusion today

## 2014-07-25 NOTE — Progress Notes (Addendum)
Uneventful infusion of REMICADE. Pt states nausea has subsided and joint pain is less. Next scheduled appt is in 8 weeks beginning her every 8 week schedule

## 2014-07-30 ENCOUNTER — Ambulatory Visit: Payer: 59 | Admitting: Physical Therapy

## 2014-07-30 DIAGNOSIS — R262 Difficulty in walking, not elsewhere classified: Secondary | ICD-10-CM

## 2014-07-30 DIAGNOSIS — M25552 Pain in left hip: Secondary | ICD-10-CM

## 2014-07-30 DIAGNOSIS — M25359 Other instability, unspecified hip: Secondary | ICD-10-CM

## 2014-07-30 NOTE — Therapy (Signed)
[facility] [address] [place], [place], [address] Phone: [phone]   Fax:  [phone]  Physical Therapy Treatment  Patient Details  Name: [name] MRN: [id number] Date of Birth: [birth year] Referring Provider:  Laurena Slimmer, MD  Encounter Date: 07/30/2014      PT End of Session - 07/30/14 1158    Visit Number 15   Number of Visits 21   Date for PT Re-Evaluation 09/10/14   PT Start Time 1146   PT Stop Time 1230   PT Time Calculation (min) 44 min   Equipment Utilized During Treatment Other (comment)  Reformer   Activity Tolerance Patient tolerated treatment well;Other (comment)  Less pain in standing post treatment   Behavior During Therapy [facility] for tasks assessed/performed      Past Medical History  Diagnosis Date  . IBS (irritable bowel syndrome)   . Acid reflux   . Colitis   . Behcet's syndrome   . GERD (gastroesophageal reflux disease)   . Uveitis   . Adrenal insufficiency   . Subjective visual disturbance 07/14/2012  . Tear of lateral meniscus of right knee, current     Tear of lateral & medial meniscus of right knee  . Asthma   . PONV (postoperative nausea and vomiting)     also low blood pressure at times  . Pneumonia     20 years ago  . Refusal of blood transfusions as patient is Jehovah's Witness   . Chronic back pain   . Chronic neck pain   . Osteoporosis     T score reported -2.7    Past Surgical History  Procedure Laterality Date  . Cholecystectomy  12/2004  . Pelvic laparoscopy      DL  . Dilation and curettage of uterus    . Hysteroscopy    . Cesarean section  03/2001  . Knee surgery Left 05/1998  . Hand surgery    . Breast surgery      Cysts excised  . Oophorectomy      BSO  . Vaginal hysterectomy  07/2007    LAVH BSO  . Back surgery    . Knee arthroscopy with lateral menisectomy Right 12/12/2012    Procedure: KNEE ARTHROSCOPY WITH LATERAL MENISECTOMY;  Surgeon: Nilda Simmer, MD;  Location: [facility];  Service: Orthopedics;  Laterality: Right;  . Thyroid cyst excision    . Anterior fusion cervical spine  08/22/2013    c4 5  6         . Anterior cervical decomp/discectomy fusion N/A 08/22/2013    Procedure: ANTERIOR CERVICAL DECOMPRESSION/DISCECTOMY FUSION 2 LEVELS;  Surgeon: Karn Cassis, MD;  Location: [facility];  Service: Neurosurgery;  Laterality: N/A;  C4-5 C5-6 Anterior cervical decompression/diskectomy/fusion  . Cardiac catheterization  09/18/2013  . Left heart catheterization with coronary angiogram N/A 09/18/2013    Procedure: LEFT HEART CATHETERIZATION WITH CORONARY ANGIOGRAM;  Surgeon: Micheline Chapman, MD;  Location: [facility] CATH LAB;  Service: Cardiovascular;  Laterality: N/A;    There were no vitals filed for this visit.  Visit Diagnosis:  Difficulty in walking involving pelvis  Pelvic joint pain, left  Instability of pelvis or thigh joint      Subjective Assessment - 07/30/14 1148    Subjective Last night could not move entire leg for a bit. Numb. LLE, had increased pain all weekend.     Currently in Pain? Yes   Pain Score 7  Pain Location Sacrum   Pain Orientation Left   Pain Descriptors / Indicators Sore   Pain Type Chronic pain   Pain Onset More than a month ago   Pain Frequency Intermittent     .Pilates Reformer used for LE/core strength, postural strength, lumbopelvic disassociation and core control.  Exercises included: Footwork 2 Red and 1 Blue, became too heavy for legs.  Adjusted to 2 Red and 1 yellow, parallel heels and forefoot.  Wide BOS.   Bridging parallel with ball squeeze x 10 added isometric hold adductors Supine Arm work 1 Red arcs, T x 10 each Feet in Straps 1 Red 1 yellow Arcs and adduction x 10 each, parallel squats with ball x10  Quadruped UE 1 red x10, LE x 10  Mermaid 1 red each way x5       PT Education - 07/30/14 1224    Education provided No          PT Short Term Goals -  07/30/14 1239    PT SHORT TERM GOAL #1   Title Pt will be able to walk with 50% weightbearing on LLE and LRAD if needed    Status Achieved   PT SHORT TERM GOAL #2   Title Pt will be I with initial HEP   Status Achieved   PT SHORT TERM GOAL #3   Title Pt will be able to sit for 15-20 min with equal weighbearing for meals, appts.    Status Achieved   PT SHORT TERM GOAL #4   Title Pt will be I with concepts fo RICE, posture and body mechanics   Status Achieved           PT Long Term Goals - 07/30/14 1203    PT LONG TERM GOAL #1   Title Pt will be I with advanced HEP   Status On-going   PT LONG TERM GOAL #2   Title Pt will be able to walk without LRAD and occ min pain in her home   Status On-going   PT LONG TERM GOAL #3   Title Pt wil be able to do light housework with occ min pain   Status On-going   PT LONG TERM GOAL #4   Title Pt will be score 50% or less to demo functional improvement.    Status On-going   PT LONG TERM GOAL #5   Title Pt will be able to sit with equal WB and min pain as needed for driving, meals   Status On-going   PT LONG TERM GOAL #6   Title Pt will be able to walk from parking space (handicap) into clinic without crutches and min difficult.    Status On-going   PT LONG TERM GOAL #7   Title Pt will be able to walk through grocery store with 50% less pain, difficulty.    Status On-going               Plan - 07/30/14 1225    Clinical Impression Statement Patient reports continued muscle fatigue and pain in L hip/SIJ.  She is trying to balance pushing her limits without overdoing it.    Pt will benefit from skilled therapeutic intervention in order to improve on the following deficits Abnormal gait;Decreased range of motion;Difficulty walking;Postural dysfunction;Decreased endurance;Decreased activity tolerance;Increased fascial restricitons;Pain;Decreased balance;Decreased mobility;Decreased strength;Increased muscle spasms;Hypermobility   PT  Next Visit Plan Pilates Tower Reformer, dry needling   PT Home Exercise Plan has full HEP   Consulted and Agree with  Plan of Care Patient        Problem List Patient Active Problem List   Diagnosis Date Noted  . Orthostatic hypotension 12/07/2013  . Adrenal insufficiency 09/25/2013  . Palpitations 09/16/2013  . Cervical stenosis of spinal canal 08/22/2013  . Lumbar degenerative disc disease 12/30/2012  . Tear of lateral meniscus of right knee, current   . Osteopenia   . Lumbar back pain 09/13/2012  . Subjective visual disturbance 07/14/2012  . Endometriosis   . Endometrial polyp   . IBS (irritable bowel syndrome)   . Acid reflux   . Colitis   . Behcet's syndrome   . Glucose intolerance (impaired glucose tolerance) 12/19/2010  . Behcet's disease 01/06/2004    Zamari Bonsall 07/30/2014, 12:41 PM  [facility] [address] [place], [place], [address] Phone: [phone]   Fax:  [phone]    Karie Mainland, PT 07/30/2014 12:48 PM Phone: [phone] Fax: [phone]

## 2014-08-06 ENCOUNTER — Ambulatory Visit: Payer: BLUE CROSS/BLUE SHIELD | Attending: Physical Medicine and Rehabilitation | Admitting: Physical Therapy

## 2014-08-06 DIAGNOSIS — R262 Difficulty in walking, not elsewhere classified: Secondary | ICD-10-CM | POA: Diagnosis not present

## 2014-08-06 DIAGNOSIS — M25552 Pain in left hip: Secondary | ICD-10-CM | POA: Diagnosis present

## 2014-08-06 DIAGNOSIS — M25359 Other instability, unspecified hip: Secondary | ICD-10-CM

## 2014-08-06 DIAGNOSIS — M2480 Other specific joint derangements of unspecified joint, not elsewhere classified: Secondary | ICD-10-CM | POA: Diagnosis present

## 2014-08-06 NOTE — Therapy (Signed)
[place], [place], [address] Phone: [phone]   Fax:  [phone]  Physical Therapy Treatment  Patient Details  Name: [name] MRN: [id number] Date of Birth: [birth year] Referring Provider:  Foye Spurling, MD  Encounter Date: 08/06/2014      PT End of Session - 08/06/14 1205    Visit Number 16   Number of Visits 21   Date for PT Re-Evaluation 09/10/14   PT Start Time 5284   PT Stop Time 1324   PT Time Calculation (min) 51 min   Activity Tolerance Patient tolerated treatment well;No increased pain   Behavior During Therapy [facility] for tasks assessed/performed      Past Medical History  Diagnosis Date  . IBS (irritable bowel syndrome)   . Acid reflux   . Colitis   . Behcet's syndrome   . GERD (gastroesophageal reflux disease)   . Uveitis   . Adrenal insufficiency   . Subjective visual disturbance 07/14/2012  . Tear of lateral meniscus of right knee, current     Tear of lateral & medial meniscus of right knee  . Asthma   . PONV (postoperative nausea and vomiting)     also low blood pressure at times  . Pneumonia     20 years ago  . Refusal of blood transfusions as patient is Jehovah's Witness   . Chronic back pain   . Chronic neck pain   . Osteoporosis     T score reported -2.7    Past Surgical History  Procedure Laterality Date  . Cholecystectomy  12/2004  . Pelvic laparoscopy      DL  . Dilation and curettage of uterus    . Hysteroscopy    . Cesarean section  03/2001  . Knee surgery Left 05/1998  . Hand surgery    . Breast surgery      Cysts excised  . Oophorectomy      BSO  . Vaginal hysterectomy  07/2007    LAVH BSO  . Back surgery    . Knee arthroscopy with lateral menisectomy Right 12/12/2012    Procedure: KNEE ARTHROSCOPY WITH LATERAL MENISECTOMY;  Surgeon: Lorn Junes, MD;  Location: [place];  Service: Orthopedics;  Laterality: Right;  .  Thyroid cyst excision    . Anterior fusion cervical spine  08/22/2013    c4 5  6         . Anterior cervical decomp/discectomy fusion N/A 08/22/2013    Procedure: ANTERIOR CERVICAL DECOMPRESSION/DISCECTOMY FUSION 2 LEVELS;  Surgeon: Floyce Stakes, MD;  Location: [facility];  Service: Neurosurgery;  Laterality: N/A;  C4-5 C5-6 Anterior cervical decompression/diskectomy/fusion  . Cardiac catheterization  09/18/2013  . Left heart catheterization with coronary angiogram N/A 09/18/2013    Procedure: LEFT HEART CATHETERIZATION WITH CORONARY ANGIOGRAM;  Surgeon: Blane Ohara, MD;  Location: [facility] CATH LAB;  Service: Cardiovascular;  Laterality: N/A;    There were no vitals filed for this visit.  Visit Diagnosis:  Difficulty in walking involving pelvis  Pelvic joint pain, left  Instability of pelvis or thigh joint      Subjective Assessment - 08/06/14 1242    Subjective No c/o pain today, walks in with crutch but uses minimally   Currently in Pain? No/denies           [facility] Adult PT Treatment/Exercise - 08/06/14 1243    Lumbar Exercises: Aerobic   Stationary Bike level 3, 6 min  for endurance   Manual Therapy   McConnell "X" to sacrum for stability, tape oriented downward       Pilates Tower for LE/Core strength, postural strength, lumbopelvic disassociation and core control.  Exercises included: Roll down (yellow) x 10 press down/breathing, roll down modified to blue for added assistance, decr   spine articulation Side lift (blue) did not need bottom arm assist x 6 each side Seated cat stretch 1 red push thru bar x 2 Supine Arm springs: (yellow) arcs, circles, added opp arm/opp leg lift for pelvic/core stab.  Supine Leg Springs: (yellow) arcs 3 sets with varying hip positions Sidelying Leg Springs (yellow-low) adduction, sidekick, glute lift x 8-10 each LE       PT Short Term Goals - 07/30/14 1239    PT SHORT TERM GOAL #1   Title Pt will be able to walk with 50%  weightbearing on LLE and LRAD if needed    Status Achieved   PT SHORT TERM GOAL #2   Title Pt will be I with initial HEP   Status Achieved   PT SHORT TERM GOAL #3   Title Pt will be able to sit for 15-20 min with equal weighbearing for meals, appts.    Status Achieved   PT SHORT TERM GOAL #4   Title Pt will be I with concepts fo RICE, posture and body mechanics   Status Achieved           PT Long Term Goals - 08/06/14 1219    PT LONG TERM GOAL #1   Title Pt will be I with advanced HEP   Status On-going   PT LONG TERM GOAL #2   Title Pt will be able to walk without LRAD and occ min pain in her home   Status Partially Met   PT LONG TERM GOAL #3   Title Pt wil be able to do light housework with occ min pain   Status Partially Met   PT LONG TERM GOAL #4   Title Pt will be score 50% or less to demo functional improvement.    Status On-going   PT LONG TERM GOAL #5   Title Pt will be able to sit with equal WB and min pain as needed for driving, meals   Status Achieved   PT LONG TERM GOAL #6   Title Pt will be able to walk from parking space (handicap) into clinic without crutches and min difficult.    Status Partially Met   PT LONG TERM GOAL #7   Title Pt will be able to walk through grocery store with 50% less pain, difficulty.    Status On-going               Plan - 08/06/14 1244    Clinical Impression Statement Pt able to sit with equal WB for longer periods, walking longer periods with less reliance on crutch.  She bought a back-supported recumbant bike but hasnt put it together yet.    PT Next Visit Plan Pilates Tower Reformer, dry needling   PT Home Exercise Plan has full HEP   Consulted and Agree with Plan of Care Patient        Problem List Patient Active Problem List   Diagnosis Date Noted  . Orthostatic hypotension 12/07/2013  . Adrenal insufficiency 09/25/2013  . Palpitations 09/16/2013  . Cervical stenosis of spinal canal 08/22/2013  . Lumbar  degenerative disc disease 12/30/2012  . Tear of lateral meniscus of right knee, current   .  Osteopenia   . Lumbar back pain 09/13/2012  . Subjective visual disturbance 07/14/2012  . Endometriosis   . Endometrial polyp   . IBS (irritable bowel syndrome)   . Acid reflux   . Colitis   . Behcet's syndrome   . Glucose intolerance (impaired glucose tolerance) 12/19/2010  . Behcet's disease 01/06/2004    Thedore Pickel 08/06/2014, 12:45 PM  [facility] [address] [place], [place], [address] Phone: [phone]   Fax:  [phone]   Raeford Razor, PT 08/06/2014 12:50 PM Phone: [phone] Fax: [phone]

## 2014-08-13 ENCOUNTER — Ambulatory Visit: Payer: BLUE CROSS/BLUE SHIELD | Admitting: Physical Therapy

## 2014-08-13 DIAGNOSIS — R262 Difficulty in walking, not elsewhere classified: Secondary | ICD-10-CM

## 2014-08-13 DIAGNOSIS — M25359 Other instability, unspecified hip: Secondary | ICD-10-CM

## 2014-08-13 DIAGNOSIS — M25552 Pain in left hip: Secondary | ICD-10-CM

## 2014-08-13 NOTE — Therapy (Signed)
[place], [place], [address] Phone: [phone]   Fax:  [phone]  Physical Therapy Treatment  Patient Details  Name: [name] MRN: [id number] Date of Birth: 11-19-1960 Referring Provider:  Foye Spurling, MD  Encounter Date: 08/13/2014      PT End of Session - 08/13/14 1117    Visit Number 17   Number of Visits 21   Date for PT Re-Evaluation 09/10/14   PT Start Time 1103   PT Stop Time 1200   PT Time Calculation (min) 57 min   Activity Tolerance Patient tolerated treatment well;No increased pain  Could look down with less pain than initially      Past Medical History  Diagnosis Date  . IBS (irritable bowel syndrome)   . Acid reflux   . Colitis   . Behcet's syndrome   . GERD (gastroesophageal reflux disease)   . Uveitis   . Adrenal insufficiency   . Subjective visual disturbance 07/14/2012  . Tear of lateral meniscus of right knee, current     Tear of lateral & medial meniscus of right knee  . Asthma   . PONV (postoperative nausea and vomiting)     also low blood pressure at times  . Pneumonia     20 years ago  . Refusal of blood transfusions as patient is Jehovah's Witness   . Chronic back pain   . Chronic neck pain   . Osteoporosis     T score reported -2.7    Past Surgical History  Procedure Laterality Date  . Cholecystectomy  12/2004  . Pelvic laparoscopy      DL  . Dilation and curettage of uterus    . Hysteroscopy    . Cesarean section  03/2001  . Knee surgery Left 05/1998  . Hand surgery    . Breast surgery      Cysts excised  . Oophorectomy      BSO  . Vaginal hysterectomy  07/2007    LAVH BSO  . Back surgery    . Knee arthroscopy with lateral menisectomy Right 12/12/2012    Procedure: KNEE ARTHROSCOPY WITH LATERAL MENISECTOMY;  Surgeon: Lorn Junes, MD;  Location: [place];  Service: Orthopedics;  Laterality: Right;  . Thyroid cyst  excision    . Anterior fusion cervical spine  08/22/2013    c4 5  6         . Anterior cervical decomp/discectomy fusion N/A 08/22/2013    Procedure: ANTERIOR CERVICAL DECOMPRESSION/DISCECTOMY FUSION 2 LEVELS;  Surgeon: Floyce Stakes, MD;  Location: [facility];  Service: Neurosurgery;  Laterality: N/A;  C4-5 C5-6 Anterior cervical decompression/diskectomy/fusion  . Cardiac catheterization  09/18/2013  . Left heart catheterization with coronary angiogram N/A 09/18/2013    Procedure: LEFT HEART CATHETERIZATION WITH CORONARY ANGIOGRAM;  Surgeon: Blane Ohara, MD;  Location: [facility] CATH LAB;  Service: Cardiovascular;  Laterality: N/A;    There were no vitals filed for this visit.  Visit Diagnosis:  Difficulty in walking involving pelvis  Pelvic joint pain, left  Instability of pelvis or thigh joint      Subjective Assessment - 08/13/14 1111    Subjective Pain in SIJ is the same, in the same spot, increases with walking too much. Pain today is severe in mid back up to neck.    Currently in Pain? Yes   Pain Location Back   Pain Orientation Mid   Pain Descriptors / Indicators  Sharp   Pain Type Acute pain   Pain Onset 1 to 4 weeks ago   Pain Frequency Intermittent   Aggravating Factors  with movement, neck flexion      Pilates Reformer used for LE/core strength, postural strength, lumbopelvic disassociation and core control.  Exercises included: Footwork 2 Red 1 blue double leg press in parallel heels, turnout/forefoot and wide turnout x 10 each  Bridging 2 Red 1 Blue x 10 focus on articulating thoracic spine. Added stability with blue band unilateral x 10 each in bridge position Feet in Straps 1 Red 1 Yellow: Arcs, V, and circles       [facility] Adult PT Treatment/Exercise - 08/13/14 1346    Cryotherapy   Number Minutes Cryotherapy 10 Minutes   Cryotherapy Location Other (comment)  middle back   Type of Cryotherapy Ice pack   Manual Therapy   Joint Mobilization gr 1-2 P/A mobs for  gentle mobility and assessment   Soft tissue mobilization thoracic paraspinals T3-T7   Myofascial Release mid back   McConnell "X" to sacrum for stability, tape oriented downward          PT Education - 08/13/14 1353    Education provided Yes   Education Details Rec seeing MD regarding continued mid back pain   Person(s) Educated Patient   Methods Explanation   Comprehension Verbalized understanding          PT Short Term Goals - 07/30/14 1239    PT SHORT TERM GOAL #1   Title Pt will be able to walk with 50% weightbearing on LLE and LRAD if needed    Status Achieved   PT SHORT TERM GOAL #2   Title Pt will be I with initial HEP   Status Achieved   PT SHORT TERM GOAL #3   Title Pt will be able to sit for 15-20 min with equal weighbearing for meals, appts.    Status Achieved   PT SHORT TERM GOAL #4   Title Pt will be I with concepts fo RICE, posture and body mechanics   Status Achieved           PT Long Term Goals - 08/13/14 1356    PT LONG TERM GOAL #1   Title Pt will be I with advanced HEP   Status On-going   PT LONG TERM GOAL #2   Title Pt will be able to walk without LRAD and occ min pain in her home   Status Partially Met   PT LONG TERM GOAL #3   Title Pt wil be able to do light housework with occ min pain   Status Partially Met   PT LONG TERM GOAL #4   Title Pt will be score 50% or less to demo functional improvement.    Status On-going   PT LONG TERM GOAL #5   Title Pt will be able to sit with equal WB and min pain as needed for driving, meals   Status Achieved   PT LONG TERM GOAL #6   Title Pt will be able to walk from parking space (handicap) into clinic without crutches and min difficult.    Baseline unable, tried today *08/13/14   Status On-going   PT LONG TERM GOAL #7   Title Pt will be able to walk through grocery store with 50% less pain, difficulty.    Status On-going               Plan - 08/13/14 1354  Clinical Impression Statement  Patient reports ongoing middle back pain which began about 2 weeks ago. She feels it began when she was in PT during a quadruped position.  She feels pulling, pain when looking down (mid back).  Able to tolerate min to mod manual work, but it improved with time.  She is interested in dry needling, may try to add a session or try to schedule with a dry needling PT next week.     PT Next Visit Plan Pilates Tower Reformer, dry needling. See what MD said (Murphy[place]Wainer)   PT Home Exercise Plan has full    Consulted and Agree with Plan of Care Patient        Problem List Patient Active Problem List   Diagnosis Date Noted  . Orthostatic hypotension 12/07/2013  . Adrenal insufficiency 09/25/2013  . Palpitations 09/16/2013  . Cervical stenosis of spinal canal 08/22/2013  . Lumbar degenerative disc disease 12/30/2012  . Tear of lateral meniscus of right knee, current   . Osteopenia   . Lumbar back pain 09/13/2012  . Subjective visual disturbance 07/14/2012  . Endometriosis   . Endometrial polyp   . IBS (irritable bowel syndrome)   . Acid reflux   . Colitis   . Behcet's syndrome   . Glucose intolerance (impaired glucose tolerance) 12/19/2010  . Behcet's disease 01/06/2004    PAA,JENNIFER 08/13/2014, 1:58 PM  [facility] [address] [place], [place], [address] Phone: [phone]   Fax:  [phone]    Raeford Razor, PT 08/13/2014 1:59 PM Phone: [phone] Fax: [phone]

## 2014-08-20 ENCOUNTER — Ambulatory Visit: Payer: BLUE CROSS/BLUE SHIELD | Admitting: Physical Therapy

## 2014-08-20 DIAGNOSIS — R262 Difficulty in walking, not elsewhere classified: Secondary | ICD-10-CM | POA: Diagnosis not present

## 2014-08-20 DIAGNOSIS — M25359 Other instability, unspecified hip: Secondary | ICD-10-CM

## 2014-08-20 DIAGNOSIS — M25552 Pain in left hip: Secondary | ICD-10-CM

## 2014-08-20 NOTE — Patient Instructions (Signed)
TENS unit dispensed and patient education on application, contraindications and use.

## 2014-08-20 NOTE — Therapy (Signed)
[place], [place], [address] Phone: [phone]   Fax:  [phone]  Physical Therapy Treatment  Patient Details  Name: [name] MRN: [id number] Date of Birth: 20-Jan-1960 Referring Provider:  Foye Spurling, MD  Encounter Date: 08/20/2014      PT End of Session - 08/20/14 1117    Visit Number 18   Number of Visits 21   Date for PT Re-Evaluation 09/10/14   PT Start Time 1109   PT Stop Time 1150   PT Time Calculation (min) 41 min   Equipment Utilized During Treatment Other (comment)  TENS unit   Activity Tolerance Patient tolerated treatment well   Behavior During Therapy [facility] for tasks assessed/performed      Past Medical History  Diagnosis Date  . IBS (irritable bowel syndrome)   . Acid reflux   . Colitis   . Behcet's syndrome   . GERD (gastroesophageal reflux disease)   . Uveitis   . Adrenal insufficiency   . Subjective visual disturbance 07/14/2012  . Tear of lateral meniscus of right knee, current     Tear of lateral & medial meniscus of right knee  . Asthma   . PONV (postoperative nausea and vomiting)     also low blood pressure at times  . Pneumonia     20 years ago  . Refusal of blood transfusions as patient is Jehovah's Witness   . Chronic back pain   . Chronic neck pain   . Osteoporosis     T score reported -2.7    Past Surgical History  Procedure Laterality Date  . Cholecystectomy  12/2004  . Pelvic laparoscopy      DL  . Dilation and curettage of uterus    . Hysteroscopy    . Cesarean section  03/2001  . Knee surgery Left 05/1998  . Hand surgery    . Breast surgery      Cysts excised  . Oophorectomy      BSO  . Vaginal hysterectomy  07/2007    LAVH BSO  . Back surgery    . Knee arthroscopy with lateral menisectomy Right 12/12/2012    Procedure: KNEE ARTHROSCOPY WITH LATERAL MENISECTOMY;  Surgeon: Lorn Junes, MD;  Location: [place];   Service: Orthopedics;  Laterality: Right;  . Thyroid cyst excision    . Anterior fusion cervical spine  08/22/2013    c4 5  6         . Anterior cervical decomp/discectomy fusion N/A 08/22/2013    Procedure: ANTERIOR CERVICAL DECOMPRESSION/DISCECTOMY FUSION 2 LEVELS;  Surgeon: Floyce Stakes, MD;  Location: [facility];  Service: Neurosurgery;  Laterality: N/A;  C4-5 C5-6 Anterior cervical decompression/diskectomy/fusion  . Cardiac catheterization  09/18/2013  . Left heart catheterization with coronary angiogram N/A 09/18/2013    Procedure: LEFT HEART CATHETERIZATION WITH CORONARY ANGIOGRAM;  Surgeon: Blane Ohara, MD;  Location: [facility] CATH LAB;  Service: Cardiovascular;  Laterality: N/A;    There were no vitals filed for this visit.  Visit Diagnosis:  Difficulty in walking involving pelvis  Pelvic joint pain, left  Instability of pelvis or thigh joint      Subjective Assessment - 08/20/14 1112    Subjective I have a Rx for a TENS unit for mid back pain.  Dr. Alfonso Ramus said it was muscular. Has been riding her recumbant bike for aprox. 6 min per day   Currently in Pain? Yes  Pain Score 6    Pain Location Back   Pain Orientation Left;Posterior;Proximal   Pain Type Chronic pain   Pain Onset More than a month ago   Pain Frequency Intermittent       Pilates Reformer used for LE/core strength, postural strength, lumbopelvic disassociation and core control.  Exercises included: Footwork 2 Red 1 Blue parallel heels, turn out heels and heel raises  Feet in Straps 1 Red 1 yellow arcs x 10, added magic circle x 10 for adduction Circles x 8 each direction, min cues for maintaining neutral  Bridging with circle for abd [place] x 10  Mermaid 1 Red each side, more flexible with Rt. sidebending       [facility] Adult PT Treatment/Exercise - 08/20/14 1130    Self-Care   Other Self-Care Comments  TENS UNIT    practiced to Rt. Shoulder and was able to get good relief.       PT Education  - 08/20/14 1152    Education provided Yes   Education Details TENS   Person(s) Educated Patient   Methods Explanation;Handout   Comprehension Verbalized understanding;Returned demonstration          PT Short Term Goals - 08/20/14 1117    PT SHORT TERM GOAL #1   Title Pt will be able to walk with 50% weightbearing on LLE and LRAD if needed    Status Achieved   PT SHORT TERM GOAL #2   Title Pt will be I with initial HEP   Status Achieved   PT SHORT TERM GOAL #3   Title Pt will be able to sit for 15-20 min with equal weighbearing for meals, appts.    Status Achieved   PT SHORT TERM GOAL #4   Title Pt will be I with concepts fo RICE, posture and body mechanics   Status Achieved           PT Long Term Goals - 08/20/14 1118    PT LONG TERM GOAL #1   Title Pt will be I with advanced HEP   Status On-going   PT LONG TERM GOAL #2   Title Pt will be able to walk without LRAD and occ min pain in her home   Status Partially Met   PT LONG TERM GOAL #3   Title Pt wil be able to do light housework with occ min pain   Status Partially Met   PT LONG TERM GOAL #4   Title Pt will be score 50% or less to demo functional improvement.    Status Unable to assess   PT LONG TERM GOAL #5   Title Pt will be able to sit with equal WB and min pain as needed for driving, meals   Status Achieved   PT LONG TERM GOAL #6   Title Pt will be able to walk from parking space (handicap) into clinic without crutches and min difficult.    Status On-going   PT LONG TERM GOAL #7   Title Pt will be able to walk through grocery store with 50% less pain, difficulty.    Status On-going               Plan - 08/20/14 1153    Clinical Impression Statement Patient reports less pain in mid back today, MD reassured her.  She reports she may not need the dry needling.  She has not met any goals further.  She said she can walk without the crutch in only one  room at a time.  Understands exercises and can demo.   She will have 2 more visits to progress stability and tolerance for gait without crutch.    PT Next Visit Plan see how TENS is working, check full HEP and progress, discuss DC plan to continue- offer Pilates mat?    Consulted and Agree with Plan of Care Patient        Problem List Patient Active Problem List   Diagnosis Date Noted  . Orthostatic hypotension 12/07/2013  . Adrenal insufficiency 09/25/2013  . Palpitations 09/16/2013  . Cervical stenosis of spinal canal 08/22/2013  . Lumbar degenerative disc disease 12/30/2012  . Tear of lateral meniscus of right knee, current   . Osteopenia   . Lumbar back pain 09/13/2012  . Subjective visual disturbance 07/14/2012  . Endometriosis   . Endometrial polyp   . IBS (irritable bowel syndrome)   . Acid reflux   . Colitis   . Behcet's syndrome   . Glucose intolerance (impaired glucose tolerance) 12/19/2010  . Behcet's disease 01/06/2004    Khaya Theissen 08/20/2014, 11:57 AM  [facility] [address] [place], [place], [address] Phone: [phone]   Fax:  [phone]    Raeford Razor, PT 08/20/2014 11:57 AM Phone: [phone] Fax: [phone]

## 2014-08-27 ENCOUNTER — Ambulatory Visit: Payer: BLUE CROSS/BLUE SHIELD | Admitting: Physical Therapy

## 2014-08-27 DIAGNOSIS — R262 Difficulty in walking, not elsewhere classified: Secondary | ICD-10-CM | POA: Diagnosis not present

## 2014-08-27 DIAGNOSIS — M25359 Other instability, unspecified hip: Secondary | ICD-10-CM

## 2014-08-27 DIAGNOSIS — M25552 Pain in left hip: Secondary | ICD-10-CM

## 2014-08-27 NOTE — Therapy (Signed)
[place], [place], [address] Phone: [phone]   Fax:  [phone]  Physical Therapy Treatment  Patient Details  Name: [name] MRN: [id number] Date of Birth: [birth year] Referring Provider:  Foye Spurling, MD  Encounter Date: 08/27/2014      PT End of Session - 08/27/14 1420    Visit Number 19   Number of Visits 21   Date for PT Re-Evaluation 09/10/14   PT Start Time 0217   PT Stop Time 0300   PT Time Calculation (min) 43 min   Activity Tolerance Patient tolerated treatment well   Behavior During Therapy [facility] for tasks assessed/performed      Past Medical History  Diagnosis Date  . IBS (irritable bowel syndrome)   . Acid reflux   . Colitis   . Behcet's syndrome   . GERD (gastroesophageal reflux disease)   . Uveitis   . Adrenal insufficiency   . Subjective visual disturbance 07/14/2012  . Tear of lateral meniscus of right knee, current     Tear of lateral & medial meniscus of right knee  . Asthma   . PONV (postoperative nausea and vomiting)     also low blood pressure at times  . Pneumonia     20 years ago  . Refusal of blood transfusions as patient is Jehovah's Witness   . Chronic back pain   . Chronic neck pain   . Osteoporosis     T score reported -2.7    Past Surgical History  Procedure Laterality Date  . Cholecystectomy  12/2004  . Pelvic laparoscopy      DL  . Dilation and curettage of uterus    . Hysteroscopy    . Cesarean section  03/2001  . Knee surgery Left 05/1998  . Hand surgery    . Breast surgery      Cysts excised  . Oophorectomy      BSO  . Vaginal hysterectomy  07/2007    LAVH BSO  . Back surgery    . Knee arthroscopy with lateral menisectomy Right 12/12/2012    Procedure: KNEE ARTHROSCOPY WITH LATERAL MENISECTOMY;  Surgeon: Lorn Junes, MD;  Location: [place];  Service: Orthopedics;  Laterality: Right;  . Thyroid cyst excision     . Anterior fusion cervical spine  08/22/2013    c4 5  6         . Anterior cervical decomp/discectomy fusion N/A 08/22/2013    Procedure: ANTERIOR CERVICAL DECOMPRESSION/DISCECTOMY FUSION 2 LEVELS;  Surgeon: Floyce Stakes, MD;  Location: [facility];  Service: Neurosurgery;  Laterality: N/A;  C4-5 C5-6 Anterior cervical decompression/diskectomy/fusion  . Cardiac catheterization  09/18/2013  . Left heart catheterization with coronary angiogram N/A 09/18/2013    Procedure: LEFT HEART CATHETERIZATION WITH CORONARY ANGIOGRAM;  Surgeon: Blane Ohara, MD;  Location[facility] CATH LAB;  Service: Cardiovascular;  Laterality: N/A;    There were no vitals filed for this visit.  Visit Diagnosis:  Difficulty in walking involving pelvis  Pelvic joint pain, left  Instability of pelvis or thigh joint      Subjective Assessment - 08/27/14 1422    Subjective I had a funeral and lots of walking and I am about a 5-6/10 now but I was a 10/10 after the funeral even though I used the TENS unit.  I still use the one crutch.  Pt expressed being very emotional and wonders if she will ever get  better. .  On Friday I sat in recliner all day and used a heating pad all day.  I tried to walk from the back row of cars to front door without crutch  took about 8-10 min 20 to 250 ft.   Pertinent History questionable MI/syncope, Behcet's syndrome, L Fusion, C fusion, bilateral knee surgery   Limitations Sitting;Standing;Walking;House hold activities   Diagnostic tests MRI Pelvis Mild sacroiliac degenerative changes, neg. overall done 04/05/14   Patient Stated Goals decreasing pain   Currently in Pain? Yes   Pain Score 6    Pain Location Back   Pain Orientation Left;Posterior;Proximal   Pain Descriptors / Indicators Sharp   Pain Type Chronic pain   Pain Onset More than a month ago   Pain Frequency Intermittent   Pain Relieving Factors doing HEP helps                         [facility] Adult PT  Treatment/Exercise - 08/27/14 1429    Self-Care   Other Self-Care Comments  TENS Unit use and ideas for acclimation    Lumbar Exercises: Supine   Clam 10 reps  magic circle    Bent Knee Raise 10 reps   Bent Knee Raise Limitations Pt slowly and cautiously perform.   Dead Bug 10 reps   Bridge 10 reps  eccentric and concentric   Straight Leg Raise 10 reps  slow, fast and hover for 10 sec x    Straight Leg Raises Limitations able to do 10 reps for all except for hover 5 x on [place] and 3 x on left   Other Supine Lumbar Exercises Using pilates circle with resisted flex/abd x 5   Other Supine Lumbar Exercises supine hooklying with Pilates circle x 10 , table top with magic circle and abd x 10   Lumbar Exercises: Prone   Straight Leg Raise 10 reps  bilaterally   Other Prone Lumbar Exercises plank on prone on elbows for 6 sec and then 10 sec   Manual Therapy   Soft tissue mobilization Right upper trap and and cervical paraspinals and Left gluteals          Trigger Point Dry Needling - 08/27/14 1453    Consent Given? Yes   Education Handout Provided No  previously given   Muscles Treated Upper Body Upper trapezius;Levator scapulae  C-3 bilateral paraspinal bilateral  pt report tinnitus gone   Muscles Treated Lower Body Gluteus minimus;Gluteus maximus;Piriformis  left side only   Upper Trapezius Response Twitch reponse elicited;Palpable increased muscle length  right side only   Levator Scapulae Response Twitch response elicited  right side only   Gluteus Maximus Response Twitch response elicited;Palpable increased muscle length  left side only   Gluteus Minimus Response Twitch response elicited;Palpable increased muscle length  left side only   Piriformis Response Twitch response elicited;Palpable increased muscle length  left side only              PT Education - 08/27/14 1447    Education Details Pt given info about Pilates class and reviewed TENS and ideas for  acclimation. Reviewed HEP and given SLR ex   Person(s) Educated Patient   Methods Explanation;Demonstration;Handout;Verbal cues   Comprehension Verbalized understanding;Returned demonstration          PT Short Term Goals - 08/20/14 1117    PT SHORT TERM GOAL #1   Title Pt will be able to walk with 50% weightbearing on LLE  and LRAD if needed    Status Achieved   PT SHORT TERM GOAL #2   Title Pt will be I with initial HEP   Status Achieved   PT SHORT TERM GOAL #3   Title Pt will be able to sit for 15-20 min with equal weighbearing for meals, appts.    Status Achieved   PT SHORT TERM GOAL #4   Title Pt will be I with concepts fo RICE, posture and body mechanics   Status Achieved           PT Long Term Goals - 08/20/14 1118    PT LONG TERM GOAL #1   Title Pt will be I with advanced HEP   Status On-going   PT LONG TERM GOAL #2   Title Pt will be able to walk without LRAD and occ min pain in her home   Status Partially Met   PT LONG TERM GOAL #3   Title Pt wil be able to do light housework with occ min pain   Status Partially Met   PT LONG TERM GOAL #4   Title Pt will be score 50% or less to demo functional improvement.    Status Unable to assess   PT LONG TERM GOAL #5   Title Pt will be able to sit with equal WB and min pain as needed for driving, meals   Status Achieved   PT LONG TERM GOAL #6   Title Pt will be able to walk from parking space (handicap) into clinic without crutches and min difficult.    Status On-going   PT LONG TERM GOAL #7   Title Pt will be able to walk through grocery store with 50% less pain, difficulty.    Status On-going               Plan - 08/27/14 1426    Clinical Impression Statement Pt reports having a bad week after a funeral.  with lots of walking with pain and Right arm flairs up with use.  had a bad week.  up to 10/10 after lots of activity and had to sit in reclinier all day Friday with heat.  Pt is discouraged about  progressing and ever being pain free..  TENS unit  is useful and she moves it around .  Pt  4/10 after treatment but initially 6/10.  Pt reports haveing tinnitus in bilateal ears before C-3 cervical dry needling and noted absence post treamtment.     PT Next Visit Plan Pt reviewed HEP for any question. FOTO and address in self/care for home use before DC next visit   Consulted and Agree with Plan of Care Patient        Problem List Patient Active Problem List   Diagnosis Date Noted  . Orthostatic hypotension 12/07/2013  . Adrenal insufficiency 09/25/2013  . Palpitations 09/16/2013  . Cervical stenosis of spinal canal 08/22/2013  . Lumbar degenerative disc disease 12/30/2012  . Tear of lateral meniscus of right knee, current   . Osteopenia   . Lumbar back pain 09/13/2012  . Subjective visual disturbance 07/14/2012  . Endometriosis   . Endometrial polyp   . IBS (irritable bowel syndrome)   . Acid reflux   . Colitis   . Behcet's syndrome   . Glucose intolerance (impaired glucose tolerance) 12/19/2010  . Behcet's disease 01/06/2004    Voncille Lo, PT 08/27/2014 6:56 PM Phone: [phone] Fax: [facility] [address]  [place], [place], [address] Phone: [phone]   Fax:  [phone]

## 2014-08-27 NOTE — Patient Instructions (Signed)
Straight Leg Raise   Tighten stomach and slowly raise locked right leg __6__ inches from floor. Do it fast x 10, slow x 10 and hover for 10 sec x 5 each leg as shown in clinic  http://orth.exer.us/1103   Copyright  VHI. All rights reserved.  Garen Lah, PT 08/27/2014 2:47 PM Phone: [phone] Fax: [phone]

## 2014-09-04 ENCOUNTER — Ambulatory Visit: Payer: BLUE CROSS/BLUE SHIELD | Admitting: Physical Therapy

## 2014-09-04 DIAGNOSIS — R262 Difficulty in walking, not elsewhere classified: Secondary | ICD-10-CM | POA: Diagnosis not present

## 2014-09-04 DIAGNOSIS — M25552 Pain in left hip: Secondary | ICD-10-CM

## 2014-09-04 DIAGNOSIS — M25359 Other instability, unspecified hip: Secondary | ICD-10-CM

## 2014-09-04 NOTE — Therapy (Signed)
[place], [place], [address] Phone: [phone]   Fax:  [phone]  Physical Therapy Treatment  Patient Details  Name: [name] MRN: [id number] Date of Birth: [birth year] Referring Provider:  Foye Spurling, MD  Encounter Date: 09/04/2014      PT End of Session - 09/04/14 1515    Visit Number 20   Number of Visits 21   PT Start Time 4401   PT Stop Time 1458   PT Time Calculation (min) 39 min   Activity Tolerance Patient tolerated treatment well      Past Medical History  Diagnosis Date  . IBS (irritable bowel syndrome)   . Acid reflux   . Colitis   . Behcet's syndrome   . GERD (gastroesophageal reflux disease)   . Uveitis   . Adrenal insufficiency   . Subjective visual disturbance 07/14/2012  . Tear of lateral meniscus of right knee, current     Tear of lateral & medial meniscus of right knee  . Asthma   . PONV (postoperative nausea and vomiting)     also low blood pressure at times  . Pneumonia     20 years ago  . Refusal of blood transfusions as patient is Jehovah's Witness   . Chronic back pain   . Chronic neck pain   . Osteoporosis     T score reported -2.7    Past Surgical History  Procedure Laterality Date  . Cholecystectomy  12/2004  . Pelvic laparoscopy      DL  . Dilation and curettage of uterus    . Hysteroscopy    . Cesarean section  03/2001  . Knee surgery Left 05/1998  . Hand surgery    . Breast surgery      Cysts excised  . Oophorectomy      BSO  . Vaginal hysterectomy  07/2007    LAVH BSO  . Back surgery    . Knee arthroscopy with lateral menisectomy Right 12/12/2012    Procedure: KNEE ARTHROSCOPY WITH LATERAL MENISECTOMY;  Surgeon: Lorn Junes, MD;  Location: [place];  Service: Orthopedics;  Laterality: Right;  . Thyroid cyst excision    . Anterior fusion cervical spine  08/22/2013    c4 5  6         . Anterior cervical  decomp/discectomy fusion N/A 08/22/2013    Procedure: ANTERIOR CERVICAL DECOMPRESSION/DISCECTOMY FUSION 2 LEVELS;  Surgeon: Floyce Stakes, MD;  Location: [facility];  Service: Neurosurgery;  Laterality: N/A;  C4-5 C5-6 Anterior cervical decompression/diskectomy/fusion  . Cardiac catheterization  09/18/2013  . Left heart catheterization with coronary angiogram N/A 09/18/2013    Procedure: LEFT HEART CATHETERIZATION WITH CORONARY ANGIOGRAM;  Surgeon: Blane Ohara, MD;  Location: [facility] CATH LAB;  Service: Cardiovascular;  Laterality: N/A;    There were no vitals filed for this visit.  Visit Diagnosis:  Difficulty in walking involving pelvis  Pelvic joint pain, left  Instability of pelvis or thigh joint      Subjective Assessment - 09/04/14 1504    Subjective I have a plan to see the surgeon [name]) for a consult.  SIJ fusion. Brought all her ex to wrap up HEP.  The ringing stopped for 3 days (tinnitus)   Currently in Pain? Yes   Pain Score 9    Pain Location Back   Pain Orientation Left;Posterior;Proximal   Pain Radiating Towards radiating to post knee L   Pain  Onset More than a month ago   Pain Frequency Intermittent   Aggravating Factors  weightbearing LLE   Pain Relieving Factors rest   Multiple Pain Sites No             [facility] Adult PT Treatment/Exercise - 09/04/14 1517    Lumbar Exercises: Supine   Clam 20 reps   Clam Limitations encouraged lateral breathing, also done x 10 unilat. clam   Heel Slides 10 reps   Bent Knee Raise 10 reps   Dead Bug 10 reps   Straight Leg Raise 20 reps   Straight Leg Raises Limitations on elbows hip flex and abd for quad strength    Other Supine Lumbar Exercises all above stab ex done on foam roller   Other Supine Lumbar Exercises dead bug arm and leg in opposite directions   Lumbar Exercises: Sidelying   Hip Abduction 10 reps   Other Sidelying Lumbar Exercises Pilates side kick series each leg including double leg lower lift in  sidelying    Lumbar Exercises: Prone   Opposite Arm/Leg Raise Right arm/Left leg;Left arm/Right leg;10 reps       Self care included HEP organization and concepts, Pilates mat class.            PT Short Term Goals - 09/04/14 1542    PT SHORT TERM GOAL #1   Title Pt will be able to walk with 50% weightbearing on LLE and LRAD if needed    Status Achieved   PT SHORT TERM GOAL #2   Title Pt will be I with initial HEP   Status Achieved   PT SHORT TERM GOAL #3   Title Pt will be able to sit for 15-20 min with equal weighbearing for meals, appts.    Status Achieved   PT SHORT TERM GOAL #4   Title Pt will be I with concepts fo RICE, posture and body mechanics   Status Achieved           PT Long Term Goals - 09/04/14 1541    PT LONG TERM GOAL #1   Title Pt will be I with advanced HEP   Status Achieved   PT LONG TERM GOAL #2   Title Pt will be able to walk without LRAD and occ min pain in her home   Status Partially Met   PT LONG TERM GOAL #3   Title Pt wil be able to do light housework with occ min pain   Status Partially Met   PT LONG TERM GOAL #4   Title Pt will be score 50% or less to demo functional improvement.    PT LONG TERM GOAL #5   Title Pt will be able to sit with equal WB and min pain as needed for driving, meals   Status Achieved   PT LONG TERM GOAL #6   Title Pt will be able to walk from parking space (handicap) into clinic without crutches and min difficult.    Status Partially Met   PT LONG TERM GOAL #7   Title Pt will be able to walk through grocery store with 50% less pain, difficulty.    Status Partially Met               Plan - 09/04/14 2013    Clinical Impression Statement Patient agreeable to DC today.  She has plateaued with functional gains but has great knowledge about stabilization and is I with advanced HEp.  She may consider a fusion if  advised by MD as she is still unable to weightbear normally without increasing pain.  She plans  to attend a Pilates mat class next week for continued exercise and to maintina strength.     PT Next Visit Plan NA   PT Home Exercise Plan full and I    Consulted and Agree with Plan of Care Patient        Problem List Patient Active Problem List   Diagnosis Date Noted  . Orthostatic hypotension 12/07/2013  . Adrenal insufficiency 09/25/2013  . Palpitations 09/16/2013  . Cervical stenosis of spinal canal 08/22/2013  . Lumbar degenerative disc disease 12/30/2012  . Tear of lateral meniscus of right knee, current   . Osteopenia   . Lumbar back pain 09/13/2012  . Subjective visual disturbance 07/14/2012  . Endometriosis   . Endometrial polyp   . IBS (irritable bowel syndrome)   . Acid reflux   . Colitis   . Behcet's syndrome   . Glucose intolerance (impaired glucose tolerance) 12/19/2010  . Behcet's disease 01/06/2004    Nissim Fleischer 09/04/2014, 8:15 PM  [facility] [address] [place], [place], [address] Phone: [phone]   Fax:  [phone]   Raeford Razor, PT 09/04/2014 8:16 PM Phone: [phone] Fax: [phone]   PHYSICAL THERAPY DISCHARGE SUMMARY  Visits from Start of Care: 20  Current functional level related to goals / functional outcomes: See above for goals met   Remaining deficits: Pain with weightbearing, antalgic gait with 1 crutch, weakness in L hip, core   Education / Equipment: HEP, Pilates, posture, body mech, ionto, dry needling Plan: Patient agrees to discharge.  Patient goals were partially met. Patient is being discharged due to lack of progress.  ?????plateau of progress     Raeford Razor, PT 09/04/2014 8:18 PM Phone: [phone] Fax: [phone]

## 2014-09-12 ENCOUNTER — Encounter: Payer: 59 | Admitting: Physical Therapy

## 2014-09-18 ENCOUNTER — Encounter: Payer: 59 | Admitting: Physical Therapy

## 2014-09-19 ENCOUNTER — Encounter (HOSPITAL_COMMUNITY)
Admission: RE | Admit: 2014-09-19 | Discharge: 2014-09-19 | Disposition: A | Discharge status: Home / Self Care | Payer: BLUE CROSS/BLUE SHIELD | Source: Ambulatory Visit | Attending: Internal Medicine | Admitting: Internal Medicine

## 2014-09-19 ENCOUNTER — Encounter (HOSPITAL_COMMUNITY): Payer: Self-pay

## 2014-09-19 DIAGNOSIS — M352 Behcet's disease: Secondary | ICD-10-CM | POA: Insufficient documentation

## 2014-09-19 HISTORY — DX: Tinnitus, unspecified ear: H93.19

## 2014-09-19 MED ORDER — SODIUM CHLORIDE 0.9 % IV SOLN
INTRAVENOUS | Status: AC
  Administered 2014-09-19: 250 mL via INTRAVENOUS

## 2014-09-19 MED ORDER — DIPHENHYDRAMINE HCL 25 MG PO CAPS
25.0000 mg | ORAL_CAPSULE | ORAL | Status: AC
  Administered 2014-09-19: 25 mg via ORAL
  Filled 2014-09-19: qty 1

## 2014-09-19 MED ORDER — ACETAMINOPHEN 325 MG PO TABS: 650.0000 mg | ORAL_TABLET | ORAL | Status: DC

## 2014-09-19 MED ORDER — SODIUM CHLORIDE 0.9 % IV SOLN
5.0000 mg/kg | INTRAVENOUS | Status: AC
  Administered 2014-09-19: 400 mg via INTRAVENOUS
  Filled 2014-09-19: qty 40

## 2014-09-25 ENCOUNTER — Encounter: Payer: 59 | Admitting: Physical Therapy

## 2014-10-02 ENCOUNTER — Encounter: Payer: 59 | Admitting: Physical Therapy

## 2014-10-29 ENCOUNTER — Ambulatory Visit (INDEPENDENT_AMBULATORY_CARE_PROVIDER_SITE_OTHER): Payer: BLUE CROSS/BLUE SHIELD | Admitting: Cardiovascular Disease

## 2014-10-29 ENCOUNTER — Encounter: Payer: Self-pay | Admitting: Cardiovascular Disease

## 2014-10-29 VITALS — BP 108/72 | HR 70 | Ht 65.5 in | Wt 169.3 lb

## 2014-10-29 DIAGNOSIS — R002 Palpitations: Secondary | ICD-10-CM

## 2014-10-29 NOTE — Patient Instructions (Signed)
Dr. Croitoru recommends that you schedule a follow-up appointment in: ONE YEAR  If you need a refill on your cardiac medications before your next appointment, please call your pharmacy.    

## 2014-10-29 NOTE — Progress Notes (Signed)
Patient ID: [name], female   DOB: 24-Apr-1960, 54 y.o.   MRN: [id number]      Cardiology Office Note   Date:  10/30/2014   ID:  [name], DOB [birth year], MRN [id number]  PCP:  Laurena Slimmer, MD  Cardiologist:   Thurmon Fair, MD   Chief Complaint  Patient presents with  . Follow-up    6 MONTH:  Occas. chest discomfort if she gets over heated with activity.  No complaints of edema or SOB.  Occas. positional lightheadedness.  Has a SI joint displacement left hip.  May surgery by Dr. Gerlene Fee.      History of Present Illness: [name] is a 54 y.o. female who presents for symptomatic orthostatic hypotension related to adrenal insufficiency. On a combination of beta blocker and fludrocortisone her dizziness and tachycardia symptoms are markedly improved. Today her heart rate is 70 bpm. She has no cardiac complaints. She is limited by pain in her right sacroiliac joint is using crutches. As a consequence she now has some pain in her right shoulder. She is on chronic Remicade therapy for what is I think Behcet's syndrome.  She was evaluated by Dr. Graciela Husbands last December, who agreed that she does not meet criteria for POTS, but that she appears to have iatrogenic adrenal insufficiency. The patient believes that she may have inheritable Ehlers-Danlos syndrome. Echocardiographic evaluation has shown known abnormalities of the ascending aorta or mitral valve. She had normal coronary arteries by angiography in September 2015, despite the fact that her nuclear stress test suggested decreased LV EF and some mild area of ischemia. By echo LVEF was 60-65 percent.    Past Medical History  Diagnosis Date  . IBS (irritable bowel syndrome)   . Acid reflux   . Colitis   . Behcet's syndrome (HCC)   . GERD (gastroesophageal reflux disease)   . Uveitis   . Adrenal insufficiency (HCC)   . Subjective visual disturbance 07/14/2012  . Tear of lateral meniscus of right knee,  current     Tear of lateral & medial meniscus of right knee  . Asthma   . PONV (postoperative nausea and vomiting)     also low blood pressure at times  . Pneumonia     20 years ago  . Refusal of blood transfusions as patient is Jehovah's Witness   . Chronic back pain   . Chronic neck pain   . Osteoporosis     T score reported -2.7  . Tinnitus     left ear sees Dr Ezzard Standing (ENT)    Past Surgical History  Procedure Laterality Date  . Cholecystectomy  12/2004  . Pelvic laparoscopy      DL  . Dilation and curettage of uterus    . Hysteroscopy    . Cesarean section  03/2001  . Knee surgery Left 05/1998  . Hand surgery    . Breast surgery      Cysts excised  . Oophorectomy      BSO  . Vaginal hysterectomy  07/2007    [facility] BSO  . Back surgery    . Knee arthroscopy with lateral menisectomy Right 12/12/2012    Procedure: KNEE ARTHROSCOPY WITH LATERAL MENISECTOMY;  Surgeon: Nilda Simmer, MD;  Location: [facility];  Service: Orthopedics;  Laterality: Right;  . Thyroid cyst excision    . Anterior fusion cervical spine  08/22/2013    c4 5  6         .  Anterior cervical decomp/discectomy fusion N/A 08/22/2013    Procedure: ANTERIOR CERVICAL DECOMPRESSION/DISCECTOMY FUSION 2 LEVELS;  Surgeon: Karn Cassis, MD;  Location: [facility];  Service: Neurosurgery;  Laterality: N/A;  C4-5 C5-6 Anterior cervical decompression/diskectomy/fusion  . Cardiac catheterization  09/18/2013  . Left heart catheterization with coronary angiogram N/A 09/18/2013    Procedure: LEFT HEART CATHETERIZATION WITH CORONARY ANGIOGRAM;  Surgeon: Micheline Chapman, MD;  Location: [facility] CATH LAB;  Service: Cardiovascular;  Laterality: N/A;  . Colonoscopy  03/2013     Current Outpatient Prescriptions  Medication Sig Dispense Refill  . acetaminophen (TYLENOL) 650 MG CR tablet Take 650 mg by mouth every 8 (eight) hours as needed for pain.    Marland Kitchen albuterol (PROVENTIL HFA;VENTOLIN HFA) 108 (90 BASE) MCG/ACT  inhaler Inhale 1-2 puffs into the lungs every 6 (six) hours as needed for wheezing or shortness of breath.    Marland Kitchen aspirin EC 81 MG tablet Take 81 mg by mouth daily.    . B Complex-C (B-COMPLEX WITH VITAMIN C) tablet Take 1 tablet by mouth daily.    . Calcium-Magnesium-Zinc 333-133-5 MG TABS Take 1 tablet by mouth 3 (three) times daily.     . cetirizine (ZYRTEC) 10 MG tablet Take 10 mg by mouth at bedtime.    . cholecalciferol (VITAMIN D) 1000 UNITS tablet Take 1,000 Units by mouth daily.    . Coconut Oil 1000 MG CAPS Take 1,000 mg by mouth 2 (two) times daily.    . colchicine 0.6 MG tablet Take 0.6 mg by mouth daily. Colcrys    . DULoxetine (CYMBALTA) 30 MG capsule Take 60 mg by mouth daily.     . fludrocortisone (FLORINEF) 0.1 MG tablet Take 1 tablet (0.1 mg total) by mouth daily. 30 tablet 0  . inFLIXimab (REMICADE) 100 MG injection Inject into the vein.    Marland Kitchen lansoprazole (PREVACID) 15 MG capsule Take 15 mg by mouth at bedtime.     . methotrexate 25 MG/ML injection Inject 25 mg into the skin once a week. On Wednesdays    . metoprolol succinate (TOPROL-XL) 50 MG 24 hr tablet Take 1 tablet (50 mg total) by mouth daily. 31 tablet 11  . Multiple Vitamin (MULTIVITAMIN WITH MINERALS) TABS Take 1 tablet by mouth daily.    . predniSONE (DELTASONE) 5 MG tablet Take 7.5 mg by mouth daily with breakfast.     . Propylene Glycol-Glycerin (SOOTHE OP) Apply 1 drop to eye 3 (three) times daily as needed (scratchy/blurry eyes).    . Risedronate Sodium (ATELVIA) 35 MG TBEC Take 35 mg by mouth once a week. Taken on Saturday     No current facility-administered medications for this visit.    Allergies:   Celebrex; Latex; Sulfa drugs cross reactors; Sulfites; Fentanyl; Loratadine; Simponi; Epinephrine; Erythromycin; and Morphine and related    Social History:  The patient  reports that she has never smoked. She has never used smokeless tobacco. She reports that she does not drink alcohol or use illicit drugs.    Family History:  The patient's family history includes Breast cancer in her mother and sister; Diabetes in her mother and sister; Gastric cancer in her father; Hypertension in her sister; Stroke in her mother.    ROS:  Please see the history of present illness.    Otherwise, review of systems positive for multiple musculoskeletal complaints. She specifically denies syncope or near syncope, palpitations, chest tightness at rest or with exertion, dyspnea, orthopnea, PND, lower extremity edema, intermittent claudication or  new focal neurological complaints   All other systems are reviewed and negative.    PHYSICAL EXAM: VS:  BP 108/72 mmHg  Pulse 70  Ht 5' 5.5" (1.664 m)  Wt 169 lb 4.8 oz (76.794 kg)  BMI 27.73 kg/m2 , BMI Body mass index is 27.73 kg/(m^2).  General: Alert, oriented x3, no distress Head: no evidence of trauma, PERRL, EOMI, no exophtalmos or lid lag, no myxedema, no xanthelasma; normal ears, nose and oropharynx Neck: normal jugular venous pulsations and no hepatojugular reflux; brisk carotid pulses without delay and no carotid bruits Chest: clear to auscultation, no signs of consolidation by percussion or palpation, normal fremitus, symmetrical and full respiratory excursions Cardiovascular: normal position and quality of the apical impulse, regular rhythm, normal first and second heart sounds, no murmurs, rubs or gallops Abdomen: no tenderness or distention, no masses by palpation, no abnormal pulsatility or arterial bruits, normal bowel sounds, no hepatosplenomegaly Extremities: no clubbing, cyanosis or edema; 2+ radial, ulnar and brachial pulses bilaterally; 2+ right femoral, posterior tibial and dorsalis pedis pulses; 2+ left femoral, posterior tibial and dorsalis pedis pulses; no subclavian or femoral bruits Neurological: grossly nonfocal Psych: euthymic mood, full affect   EKG:  EKG is ordered today. The ekg ordered today demonstrates normal sinus rhythm 70 bpm a  normal tracing   Recent Labs: No results found for requested labs within last 365 days.    Lipid Panel    Component Value Date/Time   CHOL 175 09/18/2013 1030   TRIG 77 09/18/2013 1030   HDL 52 09/18/2013 1030   CHOLHDL 3.4 09/18/2013 1030   VLDL 15 09/18/2013 1030   LDLCALC 108* 09/18/2013 1030      Wt Readings from Last 3 Encounters:  10/29/14 169 lb 4.8 oz (76.794 kg)  09/19/14 173 lb (78.472 kg)  07/25/14 172 lb 6 oz (78.189 kg)      ASSESSMENT AND PLAN:  The patient's symptoms of orthostatic tachycardia and dizziness have improved dramatically on combination of glucocorticoid/mineralocorticoid supplement and beta blocker therapy. She appears very well compensated from a cardiovascular standpoint. Ideally, she will gradually wean off adrenal supplements but this should be done very slowly and gradually since she has now been on steroids for a long period of time. She would be at low risk for major cardiovascular complications if there is plan for hip surgery/pelvic surgery, but she would require stress doses of steroids (intravenous hydrocortisone) for any major surgical procedure that requires general anesthesia.    Current medicines are reviewed at length with the patient today.  The patient does not have concerns regarding medicines.  The following changes have been made:  no change  Labs/ tests ordered today include:  Orders Placed This Encounter  Procedures  . EKG 12-Lead    Patient Instructions  Dr. Royann Shivers recommends that you schedule a follow-up appointment in: ONE YEAR  If you need a refill on your cardiac medications before your next appointment, please call your pharmacy.        Joie Bimler, MD  10/30/2014 2:16 PM    Thurmon Fair, MD, [facility] [phone] office [phone] pager

## 2014-11-08 ENCOUNTER — Encounter: Payer: Self-pay | Admitting: Cardiovascular Disease

## 2014-11-08 ENCOUNTER — Telehealth: Payer: Self-pay | Admitting: *Deleted

## 2014-11-08 NOTE — Telephone Encounter (Signed)
Surgical clearance sent to [facility] with a low risk for major CV complications with surgery, requires "stress doses" of hydrocortisone for adrenal insufficiency, fax 11/01/2014.

## 2014-11-14 ENCOUNTER — Encounter (HOSPITAL_COMMUNITY)
Admission: RE | Admit: 2014-11-14 | Discharge: 2014-11-14 | Disposition: A | Discharge status: Home / Self Care | Payer: BLUE CROSS/BLUE SHIELD | Source: Ambulatory Visit | Attending: Rheumatology | Admitting: Rheumatology

## 2014-11-14 DIAGNOSIS — M352 Behcet's disease: Secondary | ICD-10-CM | POA: Diagnosis not present

## 2014-11-14 LAB — CREATININE, SERUM
Creatinine, Ser: 0.51 mg/dL (ref 0.44–1.00)
GFR calc Af Amer: >60 mL/min (ref >60)
GFR calc non Af Amer: >60 mL/min (ref >60)

## 2014-11-14 LAB — ALT: ALT: 25 U/L (ref 14–54)

## 2014-11-14 LAB — ALKALINE PHOSPHATASE: Alkaline Phosphatase: 42 U/L (ref 38–126)

## 2014-11-14 LAB — C-REACTIVE PROTEIN: CRP: <0.5 mg/dL (ref <1.0)

## 2014-11-14 LAB — CBC
HCT: 37.3 % (ref 36.0–46.0)
Hemoglobin: 12.4 g/dL (ref 12.0–15.0)
MCH: 31.4 pg (ref 26.0–34.0)
MCHC: 33.2 g/dL (ref 30.0–36.0)
MCV: 94.4 fL (ref 78.0–100.0)
Platelets: 173 10*3/uL (ref 150–400)
RBC: 3.95 MIL/uL (ref 3.87–5.11)
RDW: 13.5 % (ref 11.5–15.5)
WBC: 4.8 10*3/uL (ref 4.0–10.5)

## 2014-11-14 LAB — SEDIMENTATION RATE: Sed Rate: 26 mm/hr — ABNORMAL HIGH (ref 0–22)

## 2014-11-14 LAB — ALBUMIN: Albumin: 3.9 g/dL (ref 3.5–5.0)

## 2014-11-14 LAB — AST: AST: 28 U/L (ref 15–41)

## 2014-11-14 MED ORDER — DIPHENHYDRAMINE HCL 25 MG PO CAPS
25.0000 mg | ORAL_CAPSULE | ORAL | Status: DC
  Administered 2014-11-14: 25 mg via ORAL
  Filled 2014-11-14: qty 1

## 2014-11-14 MED ORDER — SODIUM CHLORIDE 0.9 % IV SOLN
400.0000 mg | INTRAVENOUS | Status: DC
  Administered 2014-11-14: 400 mg via INTRAVENOUS
  Filled 2014-11-14: qty 40

## 2014-11-14 MED ORDER — SODIUM CHLORIDE 0.9 % IV SOLN
INTRAVENOUS | Status: DC
  Administered 2014-11-14: 11:00:00 via INTRAVENOUS

## 2014-11-14 MED ORDER — ACETAMINOPHEN 325 MG PO TABS
650.0000 mg | ORAL_TABLET | ORAL | Status: DC
  Administered 2014-11-14: 650 mg via ORAL
  Filled 2014-11-14: qty 2

## 2014-12-05 ENCOUNTER — Other Ambulatory Visit: Payer: Self-pay | Admitting: Cardiovascular Disease

## 2014-12-06 ENCOUNTER — Other Ambulatory Visit: Payer: Self-pay

## 2014-12-06 DIAGNOSIS — M352 Behcet's disease: Secondary | ICD-10-CM

## 2014-12-06 DIAGNOSIS — E2740 Unspecified adrenocortical insufficiency: Secondary | ICD-10-CM

## 2014-12-06 MED ORDER — METOPROLOL SUCCINATE ER 50 MG PO TB24: 50.0000 mg | ORAL_TABLET | Freq: Every day | ORAL | Status: DC

## 2014-12-17 ENCOUNTER — Other Ambulatory Visit: Payer: Self-pay | Admitting: Internal Medicine

## 2014-12-17 ENCOUNTER — Ambulatory Visit (HOSPITAL_COMMUNITY)
Admission: RE | Admit: 2014-12-17 | Discharge: 2014-12-17 | Disposition: A | Discharge status: Home / Self Care | Payer: BLUE CROSS/BLUE SHIELD | Source: Ambulatory Visit | Attending: Internal Medicine | Admitting: Internal Medicine

## 2014-12-17 DIAGNOSIS — R05 Cough: Secondary | ICD-10-CM | POA: Diagnosis present

## 2014-12-17 DIAGNOSIS — R918 Other nonspecific abnormal finding of lung field: Secondary | ICD-10-CM | POA: Insufficient documentation

## 2014-12-17 DIAGNOSIS — R059 Cough, unspecified: Secondary | ICD-10-CM

## 2014-12-17 DIAGNOSIS — R0989 Other specified symptoms and signs involving the circulatory and respiratory systems: Secondary | ICD-10-CM | POA: Diagnosis not present

## 2014-12-26 ENCOUNTER — Other Ambulatory Visit (HOSPITAL_COMMUNITY): Payer: Self-pay | Admitting: Orthopedic Surgery

## 2014-12-26 DIAGNOSIS — M25511 Pain in right shoulder: Secondary | ICD-10-CM

## 2015-01-08 ENCOUNTER — Ambulatory Visit (HOSPITAL_COMMUNITY): Admission: RE | Admit: 2015-01-08 | Payer: BLUE CROSS/BLUE SHIELD | Source: Ambulatory Visit

## 2015-01-09 ENCOUNTER — Encounter (HOSPITAL_COMMUNITY): Payer: Self-pay

## 2015-01-09 ENCOUNTER — Encounter (HOSPITAL_COMMUNITY)
Admission: RE | Admit: 2015-01-09 | Discharge: 2015-01-09 | Disposition: A | Discharge status: Home / Self Care | Payer: BLUE CROSS/BLUE SHIELD | Source: Ambulatory Visit | Attending: Rheumatology | Admitting: Rheumatology

## 2015-01-09 DIAGNOSIS — M352 Behcet's disease: Secondary | ICD-10-CM | POA: Insufficient documentation

## 2015-01-09 LAB — SEDIMENTATION RATE: Sed Rate: 8 mm/hr (ref 0–22)

## 2015-01-09 LAB — CBC
HCT: 36.7 % (ref 36.0–46.0)
Hemoglobin: 12.2 g/dL (ref 12.0–15.0)
MCH: 31 pg (ref 26.0–34.0)
MCHC: 33.2 g/dL (ref 30.0–36.0)
MCV: 93.4 fL (ref 78.0–100.0)
Platelets: 131 10*3/uL — ABNORMAL LOW (ref 150–400)
RBC: 3.93 MIL/uL (ref 3.87–5.11)
RDW: 13.5 % (ref 11.5–15.5)
WBC: 6.8 10*3/uL (ref 4.0–10.5)

## 2015-01-09 LAB — CREATININE, SERUM
Creatinine, Ser: 0.55 mg/dL (ref 0.44–1.00)
GFR calc Af Amer: >60 mL/min (ref >60)
GFR calc non Af Amer: >60 mL/min (ref >60)

## 2015-01-09 LAB — ALBUMIN: Albumin: 4 g/dL (ref 3.5–5.0)

## 2015-01-09 LAB — C-REACTIVE PROTEIN: CRP: <0.5 mg/dL (ref <1.0)

## 2015-01-09 LAB — AST: AST: 28 U/L (ref 15–41)

## 2015-01-09 LAB — ALT: ALT: 23 U/L (ref 14–54)

## 2015-01-09 LAB — ALKALINE PHOSPHATASE: Alkaline Phosphatase: 33 U/L — ABNORMAL LOW (ref 38–126)

## 2015-01-09 MED ORDER — ACETAMINOPHEN 325 MG PO TABS
650.0000 mg | ORAL_TABLET | ORAL | Status: AC
  Administered 2015-01-09: 650 mg via ORAL
  Filled 2015-01-09: qty 2

## 2015-01-09 MED ORDER — SODIUM CHLORIDE 0.9 % IV SOLN
INTRAVENOUS | Status: AC
Start: 2015-01-09 — End: 2015-01-09
  Administered 2015-01-09: 08:00:00 via INTRAVENOUS

## 2015-01-09 MED ORDER — SODIUM CHLORIDE 0.9 % IV SOLN
400.0000 mg | INTRAVENOUS | Status: AC
  Administered 2015-01-09: 400 mg via INTRAVENOUS
  Filled 2015-01-09: qty 40

## 2015-01-09 MED ORDER — DIPHENHYDRAMINE HCL 25 MG PO CAPS
25.0000 mg | ORAL_CAPSULE | ORAL | Status: AC
Start: 2015-01-09 — End: 2015-01-09
  Administered 2015-01-09: 25 mg via ORAL
  Filled 2015-01-09: qty 1

## 2015-02-01 MED FILL — DULoxetine HCL 30 MG CPEP: 30 | 30 days supply | Qty: 60 | Fill #1

## 2015-02-01 MED FILL — METOPROLOL SUCC ER 50 MG TA: 50 | 30 days supply | Qty: 30 | Fill #2

## 2015-02-01 MED FILL — METHOTREXATE 25 MG/ML VIAL: 50 | 30 days supply | Qty: 4 | Fill #1

## 2015-02-01 MED FILL — FLUTICASONE PROP 50 MCG SPR: 50 | 30 days supply | Qty: 16 | Fill #1

## 2015-02-08 MED FILL — COLCRYS 0.6 MG TABLET: 0.6 | 30 days supply | Qty: 60 | Fill #0

## 2015-03-05 ENCOUNTER — Other Ambulatory Visit (HOSPITAL_COMMUNITY): Payer: Self-pay | Admitting: Internal Medicine

## 2015-03-05 MED FILL — FLUTICASONE PROP 50 MCG SPR: 50 | 30 days supply | Qty: 16 | Fill #2

## 2015-03-05 MED FILL — DULoxetine HCL 30 MG CPEP: 30 | 30 days supply | Qty: 60 | Fill #2

## 2015-03-05 MED FILL — METOPROLOL SUCC ER 50 MG TA: 50 | 30 days supply | Qty: 30 | Fill #3

## 2015-03-05 MED FILL — COLCRYS 0.6 MG TABLET: 0.6 | 30 days supply | Qty: 60 | Fill #1

## 2015-03-06 ENCOUNTER — Encounter (HOSPITAL_COMMUNITY)
Admission: RE | Admit: 2015-03-06 | Discharge: 2015-03-06 | Disposition: A | Discharge status: Home / Self Care | Payer: BLUE CROSS/BLUE SHIELD | Source: Ambulatory Visit | Attending: Internal Medicine | Admitting: Internal Medicine

## 2015-03-06 DIAGNOSIS — M352 Behcet's disease: Secondary | ICD-10-CM | POA: Diagnosis not present

## 2015-03-06 LAB — CBC WITH DIFFERENTIAL/PLATELET
Basophils Absolute: 0 10*3/uL (ref 0.0–0.1)
Basophils Relative: 0 %
Eosinophils Absolute: 0.1 10*3/uL (ref 0.0–0.7)
Eosinophils Relative: 1 %
HCT: 36.6 % (ref 36.0–46.0)
Hemoglobin: 12.4 g/dL (ref 12.0–15.0)
Lymphocytes Relative: 43 %
Lymphs Abs: 3 10*3/uL (ref 0.7–4.0)
MCH: 31.5 pg (ref 26.0–34.0)
MCHC: 33.9 g/dL (ref 30.0–36.0)
MCV: 92.9 fL (ref 78.0–100.0)
Monocytes Absolute: 0.5 10*3/uL (ref 0.1–1.0)
Monocytes Relative: 7 %
Neutro Abs: 3.3 10*3/uL (ref 1.7–7.7)
Neutrophils Relative %: 49 %
Platelets: ADEQUATE 10*3/uL (ref 150–400)
RBC: 3.94 MIL/uL (ref 3.87–5.11)
RDW: 13.3 % (ref 11.5–15.5)
WBC: 6.9 10*3/uL (ref 4.0–10.5)

## 2015-03-06 LAB — C-REACTIVE PROTEIN: CRP: 0.7 mg/dL (ref <1.0)

## 2015-03-06 LAB — AST: AST: 27 U/L (ref 15–41)

## 2015-03-06 LAB — ALT: ALT: 28 U/L (ref 14–54)

## 2015-03-06 MED ORDER — DIPHENHYDRAMINE HCL 25 MG PO CAPS
50.0000 mg | ORAL_CAPSULE | ORAL | Status: DC
  Administered 2015-03-06: 50 mg via ORAL
  Filled 2015-03-06: qty 2

## 2015-03-06 MED ORDER — SODIUM CHLORIDE 0.9 % IV SOLN
INTRAVENOUS | Status: DC
  Administered 2015-03-06: 09:00:00 via INTRAVENOUS

## 2015-03-06 MED ORDER — ACETAMINOPHEN 325 MG PO TABS: 650.0000 mg | ORAL_TABLET | ORAL | Status: DC

## 2015-03-06 MED ORDER — SODIUM CHLORIDE 0.9 % IV SOLN
7.5000 mg/kg | INTRAVENOUS | Status: DC
  Administered 2015-03-06: 600 mg via INTRAVENOUS
  Filled 2015-03-06: qty 60

## 2015-03-06 NOTE — Discharge Instructions (Signed)
Infliximab injection  What is this medicine?  INFLIXIMAB (in FLIX i mab) is used to treat Crohn's disease and ulcerative colitis. It is also used to treat ankylosing spondylitis, psoriasis, and some forms of arthritis.  This medicine may be used for other purposes; ask your health care provider or pharmacist if you have questions.  What should I tell my health care provider before I take this medicine?  They need to know if you have any of these conditions:  -diabetes  -exposure to tuberculosis  -heart failure  -hepatitis or liver disease  -immune system problems  -infection  -lung or breathing disease, like COPD  -multiple sclerosis  -current or past resident of [place]  -seizure disorder  -an unusual or allergic reaction to infliximab, mouse proteins, other medicines, foods, dyes, or preservatives  -pregnant or trying to get pregnant  -breast-feeding  How should I use this medicine?  This medicine is for injection into a vein. It is usually given by a health care professional in a hospital or clinic setting.  A special MedGuide will be given to you by the pharmacist with each prescription and refill. Be sure to read this information carefully each time.  Talk to your pediatrician regarding the use of this medicine in children. Special care may be needed.  Overdosage: If you think you have taken too much of this medicine contact a poison control center or emergency room at once.  NOTE: This medicine is only for you. Do not share this medicine with others.  What if I miss a dose?  It is important not to miss your dose. Call your doctor or health care professional if you are unable to keep an appointment.  What may interact with this medicine?  Do not take this medicine with any of the following medications:  -anakinra  -rilonacept  This medicine may also interact with the following medications:  -vaccines  This list may not describe all possible interactions. Give your health care provider  a list of all the medicines, herbs, non-prescription drugs, or dietary supplements you use. Also tell them if you smoke, drink alcohol, or use illegal drugs. Some items may interact with your medicine.  What should I watch for while using this medicine?  Visit your doctor or health care professional for regular checks on your progress.  If you get a cold or other infection while receiving this medicine, call your doctor or health care professional. Do not treat yourself. This medicine may decrease your body's ability to fight infections. Before beginning therapy, your doctor may do a test to see if you have been exposed to tuberculosis.  This medicine may make the symptoms of heart failure worse in some patients. If you notice symptoms such as increased shortness of breath or swelling of the ankles or legs, contact your health care provider right away.  If you are going to have surgery or dental work, tell your health care professional or dentist that you have received this medicine.  If you take this medicine for plaque psoriasis, stay out of the sun. If you cannot avoid being in the sun, wear protective clothing and use sunscreen. Do not use sun lamps or tanning beds/booths.  What side effects may I notice from receiving this medicine?  Side effects that you should report to your doctor or health care professional as soon as possible:  -allergic reactions like skin rash, itching or hives, swelling of the face, lips, or tongue  -chest pain  -  fever or chills, usually related to the infusion  -muscle or joint pain  -red, scaly patches or raised bumps on the skin  -signs of infection - fever or chills, cough, sore throat, pain or difficulty passing urine  -swollen lymph nodes in the neck, underarm, or groin areas  -unexplained weight loss  -unusual bleeding or bruising  -unusually weak or tired  -yellowing of the eyes or skin  Side effects that usually do not require medical attention (report to your doctor or health  care professional if they continue or are bothersome):  -headache  -heartburn or stomach pain  -nausea, vomiting  This list may not describe all possible side effects. Call your doctor for medical advice about side effects. You may report side effects to FDA at 1-800-FDA-1088.  Where should I keep my medicine?  This drug is given in a hospital or clinic and will not be stored at home.  NOTE: This sheet is a summary. It may not cover all possible information. If you have questions about this medicine, talk to your doctor, pharmacist, or health care provider.     © 2016, Elsevier/Gold Standard. (2007-08-10 10:26:02)

## 2015-03-07 MED FILL — FLUDROCORTISONE 0.1 MG TAB: 0.1 | 90 days supply | Qty: 90 | Fill #0

## 2015-04-03 MED FILL — DULoxetine HCL 30 MG CPEP: 30 | 30 days supply | Qty: 60 | Fill #3

## 2015-04-03 MED FILL — LANSOPRAZOLE DR 15 MG CAP: 15 | 90 days supply | Qty: 90 | Fill #3

## 2015-04-03 MED FILL — predniSONE 5 MG TABS: 5 | 90 days supply | Qty: 180 | Fill #0

## 2015-04-03 MED FILL — METHOTREXATE 25 MG/ML VIAL: 50 | 30 days supply | Qty: 4 | Fill #2

## 2015-04-10 MED FILL — FOLIC ACID 1 MG TABLET: 1 | 90 days supply | Qty: 180 | Fill #1

## 2015-04-10 MED FILL — METOPROLOL SUCC ER 50 MG TA: 50 | 30 days supply | Qty: 30 | Fill #4

## 2015-04-10 MED FILL — COLCRYS 0.6 MG TABLET: 0.6 | 30 days supply | Qty: 60 | Fill #2

## 2015-04-29 MED FILL — FLUTICASONE PROP 50 MCG SPR: 50 | 30 days supply | Qty: 16 | Fill #0

## 2015-04-29 MED FILL — METHOTREXATE 25 MG/ML VIAL: 50 | 84 days supply | Qty: 12 | Fill #0

## 2015-04-29 MED FILL — DULoxetine HCL 30 MG CPEP: 30 | 30 days supply | Qty: 60 | Fill #4

## 2015-05-01 ENCOUNTER — Encounter (HOSPITAL_COMMUNITY): Payer: Self-pay

## 2015-05-01 ENCOUNTER — Encounter (HOSPITAL_COMMUNITY)
Admission: RE | Admit: 2015-05-01 | Discharge: 2015-05-01 | Disposition: A | Discharge status: Home / Self Care | Payer: BLUE CROSS/BLUE SHIELD | Source: Ambulatory Visit | Attending: Internal Medicine | Admitting: Internal Medicine

## 2015-05-01 DIAGNOSIS — M352 Behcet's disease: Secondary | ICD-10-CM | POA: Diagnosis present

## 2015-05-01 LAB — CREATININE, SERUM
Creatinine, Ser: 0.55 mg/dL (ref 0.44–1.00)
GFR calc Af Amer: >60 mL/min (ref >60)
GFR calc non Af Amer: >60 mL/min (ref >60)

## 2015-05-01 LAB — ALT: ALT: 32 U/L (ref 14–54)

## 2015-05-01 LAB — CBC
HCT: 34.8 % — ABNORMAL LOW (ref 36.0–46.0)
Hemoglobin: 11.9 g/dL — ABNORMAL LOW (ref 12.0–15.0)
MCH: 30.7 pg (ref 26.0–34.0)
MCHC: 34.2 g/dL (ref 30.0–36.0)
MCV: 89.9 fL (ref 78.0–100.0)
Platelets: ADEQUATE 10*3/uL (ref 150–400)
RBC: 3.87 MIL/uL (ref 3.87–5.11)
RDW: 13.3 % (ref 11.5–15.5)
WBC: 6.2 10*3/uL (ref 4.0–10.5)

## 2015-05-01 LAB — AST: AST: 30 U/L (ref 15–41)

## 2015-05-01 MED ORDER — ACETAMINOPHEN 325 MG PO TABS
650.0000 mg | ORAL_TABLET | ORAL | Status: AC
  Administered 2015-05-01: 650 mg via ORAL
  Filled 2015-05-01: qty 2

## 2015-05-01 MED ORDER — SODIUM CHLORIDE 0.9 % IV SOLN
INTRAVENOUS | Status: AC
  Administered 2015-05-01: 09:00:00 via INTRAVENOUS

## 2015-05-01 MED ORDER — SODIUM CHLORIDE 0.9 % IV SOLN
7.5000 mg/kg | INTRAVENOUS | Status: DC
  Administered 2015-05-01: 600 mg via INTRAVENOUS
  Filled 2015-05-01: qty 60

## 2015-05-01 MED ORDER — DIPHENHYDRAMINE HCL 25 MG PO CAPS
50.0000 mg | ORAL_CAPSULE | ORAL | Status: AC
  Administered 2015-05-01: 50 mg via ORAL
  Filled 2015-05-01: qty 2

## 2015-05-07 MED FILL — FLUOCINONIDE 0.05% CREAM: 0.05 | 30 days supply | Qty: 60 | Fill #0

## 2015-05-07 MED FILL — METOPROLOL SUCC ER 50 MG TA: 50 | 30 days supply | Qty: 30 | Fill #5

## 2015-05-07 MED FILL — COLCRYS 0.6 MG TABLET: 0.6 | 30 days supply | Qty: 60 | Fill #3

## 2015-06-04 MED FILL — FLUDROCORTISONE 0.1 MG TAB: 0.1 | 90 days supply | Qty: 90 | Fill #1

## 2015-06-04 MED FILL — COLCRYS 0.6 MG TABLET: 0.6 | 90 days supply | Qty: 180 | Fill #0

## 2015-06-04 MED FILL — DULoxetine HCL 30 MG CPEP: 30 | 30 days supply | Qty: 60 | Fill #5

## 2015-06-04 MED FILL — METOPROLOL SUCC ER 50 MG TA: 50 | 30 days supply | Qty: 30 | Fill #6

## 2015-06-26 ENCOUNTER — Encounter (HOSPITAL_COMMUNITY)
Admission: RE | Admit: 2015-06-26 | Payer: BLUE CROSS/BLUE SHIELD | Source: Ambulatory Visit | Attending: Internal Medicine | Admitting: Internal Medicine

## 2015-06-26 MED FILL — predniSONE 5 MG TABS: 5 | 90 days supply | Qty: 180 | Fill #0

## 2015-07-09 MED FILL — METOPROLOL SUCC ER 50 MG TA: 50 | 30 days supply | Qty: 30 | Fill #7

## 2015-07-10 MED FILL — LANSOPRAZOLE DR 15 MG CAP: 15 | 90 days supply | Qty: 90 | Fill #0

## 2015-07-10 MED FILL — DULoxetine HCL 30 MG CPEP: 30 | 30 days supply | Qty: 60 | Fill #0

## 2015-07-30 MED FILL — METHOTREXATE 25 MG/ML VIAL: 50 | 84 days supply | Qty: 12 | Fill #1

## 2015-07-30 MED FILL — FOLIC ACID 1 MG TABLET: 1 | 90 days supply | Qty: 90 | Fill #0

## 2015-08-06 MED FILL — DULoxetine HCL 30 MG CPEP: 30 | 30 days supply | Qty: 60 | Fill #1

## 2015-08-06 MED FILL — METOPROLOL SUCC ER 50 MG TA: 50 | 30 days supply | Qty: 30 | Fill #8

## 2015-08-21 ENCOUNTER — Encounter (HOSPITAL_COMMUNITY)
Admission: RE | Admit: 2015-08-21 | Payer: BLUE CROSS/BLUE SHIELD | Source: Ambulatory Visit | Attending: Internal Medicine | Admitting: Internal Medicine

## 2015-08-29 MED FILL — UNIFINE PENTIPS 31GX3/16: 31G X 5 MM | 90 days supply | Qty: 100 | Fill #0

## 2015-09-02 MED FILL — FLUDROCORTISONE 0.1 MG TAB: 0.1 | 90 days supply | Qty: 90 | Fill #2

## 2015-09-02 MED FILL — FLUTICASONE PROP 50 MCG SPR: 50 | 30 days supply | Qty: 16 | Fill #1

## 2015-09-02 MED FILL — METOPROLOL SUCC ER 50 MG TA: 50 | 30 days supply | Qty: 30 | Fill #9

## 2015-09-02 MED FILL — DULoxetine HCL 30 MG CPEP: 30 | 30 days supply | Qty: 60 | Fill #2

## 2015-09-03 MED FILL — COLCRYS 0.6 MG TABLET: 0.6 | 90 days supply | Qty: 180 | Fill #0

## 2015-09-04 MED FILL — predniSONE 1 MG TABS: 1 | 30 days supply | Qty: 120 | Fill #0

## 2015-09-10 ENCOUNTER — Other Ambulatory Visit: Payer: Self-pay | Admitting: Internal Medicine

## 2015-09-10 DIAGNOSIS — Z1231 Encounter for screening mammogram for malignant neoplasm of breast: Secondary | ICD-10-CM

## 2015-09-13 ENCOUNTER — Ambulatory Visit
Admission: RE | Admit: 2015-09-13 | Discharge: 2015-09-13 | Disposition: A | Discharge status: Home / Self Care | Payer: BLUE CROSS/BLUE SHIELD | Source: Ambulatory Visit | Attending: Internal Medicine | Admitting: Internal Medicine

## 2015-09-13 ENCOUNTER — Other Ambulatory Visit: Payer: Self-pay | Admitting: Internal Medicine

## 2015-09-13 DIAGNOSIS — Z1231 Encounter for screening mammogram for malignant neoplasm of breast: Secondary | ICD-10-CM

## 2015-10-08 MED FILL — LANSOPRAZOLE DR 15 MG CAP: 15 | 90 days supply | Qty: 90 | Fill #1

## 2015-10-08 MED FILL — predniSONE 1 MG TABS: 1 | 30 days supply | Qty: 120 | Fill #1

## 2015-10-08 MED FILL — DULoxetine HCL 30 MG CPEP: 30 | 30 days supply | Qty: 60 | Fill #3

## 2015-10-08 MED FILL — METOPROLOL SUCC ER 50 MG TA: 50 | 30 days supply | Qty: 30 | Fill #10

## 2015-10-08 MED FILL — predniSONE 5 MG TABS: 5 | 90 days supply | Qty: 180 | Fill #1

## 2015-10-16 ENCOUNTER — Encounter: Payer: Self-pay | Admitting: Cardiovascular Disease

## 2015-10-16 ENCOUNTER — Ambulatory Visit (INDEPENDENT_AMBULATORY_CARE_PROVIDER_SITE_OTHER): Payer: BLUE CROSS/BLUE SHIELD | Admitting: Cardiovascular Disease

## 2015-10-16 ENCOUNTER — Encounter (HOSPITAL_COMMUNITY): Payer: BLUE CROSS/BLUE SHIELD

## 2015-10-16 VITALS — BP 120/75 | HR 76 | Ht 65.0 in | Wt 165.4 lb

## 2015-10-16 DIAGNOSIS — R002 Palpitations: Secondary | ICD-10-CM | POA: Diagnosis not present

## 2015-10-16 DIAGNOSIS — E2740 Unspecified adrenocortical insufficiency: Secondary | ICD-10-CM | POA: Diagnosis not present

## 2015-10-16 DIAGNOSIS — I951 Orthostatic hypotension: Secondary | ICD-10-CM

## 2015-10-16 NOTE — Patient Instructions (Signed)
Dr Croitoru recommends that you schedule a follow-up appointment in 12 months. You will receive a reminder letter in the mail two months in advance. If you don't receive a letter, please call our office to schedule the follow-up appointment.  If you need a refill on your cardiac medications before your next appointment, please call your pharmacy. 

## 2015-10-16 NOTE — Progress Notes (Signed)
Cardiology Office Note    Date:  10/16/2015   ID:  [name], DOB Mar 20, 1960, MRN [id number]  PCP:  Laurena Slimmer, MD  Cardiologist:   Thurmon Fair, MD   Chief Complaint  Patient presents with  . Follow-up    sob; when walking too much. lightheaded; only when standing still too long. cramping in legs only when walking.    History of Present Illness:  [name] is a 55 y.o. female with problems with orthostatic tachycardia and hypotension, at least in part related to adrenal insufficiency. She has numerous other medical noncardiac problems including Behcet's syndrome severe sacroiliac joint disease, gastroesophageal reflux disease, IBS with diarrhea. From a cardiac point of view she has done quite well. Prolonged orthostasis still leads to dizziness and hypotension, but is better if she wears compression stockings. Does not like wearing the stockings during the summer. She is using her wheelchair a lot because walking leads to severe back/hip pain. Sudden changes in position remain an occasional problem, but she has not had syncope or falls. She has not had any issues with angina or dyspnea.  Her biggest complaints remain related to her sacroiliac joint problems. She is trying to see a new orthopedic specialist in [place].    Past Medical History:  Diagnosis Date  . Acid reflux   . Adrenal insufficiency (HCC)   . Asthma   . Behcet's syndrome (HCC)   . Chronic back pain   . Chronic neck pain   . Colitis   . GERD (gastroesophageal reflux disease)   . IBS (irritable bowel syndrome)   . Osteoporosis    T score reported -2.7  . Pneumonia    20 years ago  . PONV (postoperative nausea and vomiting)    also low blood pressure at times  . Refusal of blood transfusions as patient is Jehovah's Witness   . Subjective visual disturbance 07/14/2012  . Tear of lateral meniscus of right knee, current    Tear of lateral & medial meniscus of right knee  . Tinnitus    left ear sees Dr Ezzard Standing (ENT)  . Uveitis     Past Surgical History:  Procedure Laterality Date  . ANTERIOR CERVICAL DECOMP/DISCECTOMY FUSION N/A 08/22/2013   Procedure: ANTERIOR CERVICAL DECOMPRESSION/DISCECTOMY FUSION 2 LEVELS;  Surgeon: Karn Cassis, MD;  Location: [facility];  Service: Neurosurgery;  Laterality: N/A;  C4-5 C5-6 Anterior cervical decompression/diskectomy/fusion  . ANTERIOR FUSION CERVICAL SPINE  08/22/2013   c4 5  6         . BACK SURGERY    . BREAST SURGERY     Cysts excised  . CARDIAC CATHETERIZATION  09/18/2013  . CESAREAN SECTION  03/2001  . CHOLECYSTECTOMY  12/2004  . COLONOSCOPY  03/2013  . DILATION AND CURETTAGE OF UTERUS    . HAND SURGERY    . HYSTEROSCOPY    . KNEE ARTHROSCOPY WITH LATERAL MENISECTOMY Right 12/12/2012   Procedure: KNEE ARTHROSCOPY WITH LATERAL MENISECTOMY;  Surgeon: Nilda Simmer, MD;  Location: [facility];  Service: Orthopedics;  Laterality: Right;  . KNEE SURGERY Left 05/1998  . LEFT HEART CATHETERIZATION WITH CORONARY ANGIOGRAM N/A 09/18/2013   Procedure: LEFT HEART CATHETERIZATION WITH CORONARY ANGIOGRAM;  Surgeon: Micheline Chapman, MD;  Location: [facility]ATH LAB;  Service: Cardiovascular;  Laterality: N/A;  . OOPHORECTOMY     BSO  . PELVIC LAPAROSCOPY     DL  . THYROID CYST EXCISION    . VAGINAL HYSTERECTOMY  07/2007   [facility] BSO    Current Medications: Outpatient Medications Prior to Visit  Medication Sig Dispense Refill  . acetaminophen (TYLENOL) 650 MG CR tablet Take 650 mg by mouth every 8 (eight) hours as needed for pain.    Marland Kitchen albuterol (PROVENTIL HFA;VENTOLIN HFA) 108 (90 BASE) MCG/ACT inhaler Inhale 1-2 puffs into the lungs every 6 (six) hours as needed for wheezing or shortness of breath.    Marland Kitchen aspirin EC 81 MG tablet Take 81 mg by mouth daily.    . cholecalciferol (VITAMIN D) 1000 UNITS tablet Take 1,000 Units by mouth daily.    . colchicine 0.6 MG tablet Take 0.6 mg by mouth daily. Colcrys    .  diphenhydrAMINE (BENADRYL) 25 mg capsule Take 25 mg by mouth as needed (at bedtime).    . DULoxetine (CYMBALTA) 30 MG capsule Take 60 mg by mouth daily.     . fludrocortisone (FLORINEF) 0.1 MG tablet Take 1 tablet (0.1 mg total) by mouth daily. 30 tablet 0  . ibuprofen (ADVIL,MOTRIN) 200 MG tablet Take 200 mg by mouth as needed (at bedtime).    . inFLIXimab (REMICADE) 100 MG injection Inject into the vein.    Marland Kitchen lansoprazole (PREVACID) 15 MG capsule Take 15 mg by mouth at bedtime.     . metoprolol succinate (TOPROL-XL) 50 MG 24 hr tablet Take 1 tablet (50 mg total) by mouth daily. 30 tablet 11  . predniSONE (DELTASONE) 5 MG tablet Take 10 mg by mouth daily with breakfast.     . Propylene Glycol-Glycerin (SOOTHE OP) Apply 1 drop to eye 3 (three) times daily as needed (scratchy/blurry eyes).    . B Complex-C (B-COMPLEX WITH VITAMIN C) tablet Take 1 tablet by mouth daily.    . Calcium-Magnesium-Zinc 333-133-5 MG TABS Take 1 tablet by mouth 3 (three) times daily.     . cetirizine (ZYRTEC) 10 MG tablet Take 10 mg by mouth at bedtime.    . Coconut Oil 1000 MG CAPS Take 1,000 mg by mouth 2 (two) times daily.    . Multiple Vitamin (MULTIVITAMIN WITH MINERALS) TABS Take 1 tablet by mouth daily.    . Risedronate Sodium (ATELVIA) 35 MG TBEC Take 35 mg by mouth once a week. Taken on Saturday    . Terbutaline Sulfate (BRETHINE PO) Take by mouth as needed.     No facility-administered medications prior to visit.      Allergies:   Celebrex [celecoxib]; Latex; Sulfa drugs cross reactors; Sulfites; Fentanyl; Loratadine; Simponi [golimumab]; Epinephrine; Erythromycin; and Morphine and related   Social History   Social History  . Marital status: Legally Separated    Spouse name: N/A  . Number of children: 1  . Years of education: N/A   Occupational History  .  [employer]   Social History Main Topics  . Smoking status: Never Smoker  . Smokeless tobacco: Never Used  . Alcohol use No  . Drug use: No    . Sexual activity: No     Comment: 1st intercourse 22 yo-5 partners   Other Topics Concern  . None   Social History Narrative  . None     Family History:  The patient's family history includes Breast cancer in her mother and sister; Diabetes in her mother and sister; Gastric cancer in her father; Hypertension in her sister; Stroke in her mother.   ROS:   Please see the history of present illness.    ROS All other systems reviewed and are negative.  PHYSICAL EXAM:   VS:  BP 120/75   Pulse 76   Ht 5\' 5"  (1.651 m)   Wt 165 lb 6.4 oz (75 kg)   BMI 27.52 kg/m    GEN: Well nourished, well developed, in no acute distress  HEENT: normal  Neck: no JVD, carotid bruits, or masses Cardiac: RRR; no murmurs, rubs, or gallops,no edema  Respiratory:  clear to auscultation bilaterally, normal work of breathing GI: soft, nontender, nondistended, + BS MS: no deformity or atrophy  Skin: warm and dry, no rash Neuro:  Alert and Oriented x 3, Strength and sensation are intact Psych: euthymic mood, full affect  Wt Readings from Last 3 Encounters:  10/16/15 165 lb 6.4 oz (75 kg)  05/01/15 170 lb (77.1 kg)  03/06/15 171 lb (77.6 kg)      Studies/Labs Reviewed:   EKG:  EKG is ordered today.  The ekg ordered today demonstrates Sinus rhythm, and generalized low voltage but otherwise normal.  Recent Labs: 05/01/2015: ALT 32; Creatinine, Ser 0.55; Hemoglobin 11.9; Platelets PLATELET CLUMPS NOTED ON SMEAR, COUNT APPEARS ADEQUATE   Lipid Panel    Component Value Date/Time   CHOL 175 09/18/2013 1030   TRIG 77 09/18/2013 1030   HDL 52 09/18/2013 1030   CHOLHDL 3.4 09/18/2013 1030   VLDL 15 09/18/2013 1030   LDLCALC 108 (H) 09/18/2013 1030     ASSESSMENT:    1. Adrenal insufficiency (HCC)   2. Orthostatic hypotension   3. Palpitations      PLAN:  In order of problems listed above:  Mrs. [name] had palpitations related to sinus tachycardia and near syncope related to  hypotension in the setting of iatrogenic adrenal insufficiency. Persistent diarrhea and secondary hypovolemia were also contributing. She appears to now be euvolemic and is hemodynamically well compensated on a combination of fludrocortisone and prednisone. No changes are made to her cardiac regimen. she does need to avoid prolonged orthostasis without moving and  try to wear compression stockings more consistently  If she is to undergo pelvic surgery, I think she is at low risk for major cardiovascular complications. She will require stress doses of intravenous hydrocortisone if she undergoes any major surgical procedure/general anesthesia.    Medication Adjustments/Labs and Tests Ordered: Current medicines are reviewed at length with the patient today.  Concerns regarding medicines are outlined above.  Medication changes, Labs and Tests ordered today are listed in the Patient Instructions below. Patient Instructions  Dr 09/20/2013 recommends that you schedule a follow-up appointment in 12 months. You will receive a reminder letter in the mail two months in advance. If you don't receive a letter, please call our office to schedule the follow-up appointment.  If you need a refill on your cardiac medications before your next appointment, please call your pharmacy.    Signed, Royann Shivers, MD  10/16/2015 3:07 PM    [facility] [address] [place], [place], [place]  [place] Phone: [phone]; Fax: [phone]

## 2015-11-05 MED FILL — DULoxetine HCL 30 MG CPEP: 30 | 30 days supply | Qty: 60 | Fill #4

## 2015-11-05 MED FILL — FOLIC ACID 1 MG TABLET: 1 | 90 days supply | Qty: 90 | Fill #1

## 2015-11-05 MED FILL — METOPROLOL SUCC ER 50 MG TA: 50 | 30 days supply | Qty: 30 | Fill #11

## 2015-11-06 MED FILL — METHOTREXATE 25 MG/ML VIAL: 50 | 84 days supply | Qty: 12 | Fill #2

## 2015-11-20 MED FILL — predniSONE 2.5 MG TABS: 2.5 | 50 days supply | Qty: 50 | Fill #0

## 2015-12-03 ENCOUNTER — Other Ambulatory Visit: Payer: Self-pay | Admitting: Cardiovascular Disease

## 2015-12-03 DIAGNOSIS — M352 Behcet's disease: Secondary | ICD-10-CM

## 2015-12-03 DIAGNOSIS — E2740 Unspecified adrenocortical insufficiency: Secondary | ICD-10-CM

## 2015-12-03 MED FILL — DULoxetine HCL 30 MG CPEP: 30 | 30 days supply | Qty: 60 | Fill #5

## 2015-12-03 MED FILL — FLUTICASONE PROP 50 MCG SPR: 50 | 30 days supply | Qty: 16 | Fill #2

## 2015-12-03 MED FILL — UNIFINE PENTIPS 31GX3/16: 31G X 5 MM | 90 days supply | Qty: 100 | Fill #1

## 2015-12-03 MED FILL — FLUDROCORTISONE 0.1 MG TAB: 0.1 | 90 days supply | Qty: 90 | Fill #3

## 2015-12-03 MED FILL — UNIFINE PENTIPS 31GX3/16": 31G X 5 MM | 90 days supply | Qty: 100 | Fill #1

## 2015-12-04 MED FILL — METOPROLOL SUCC ER 50 MG TA: 50 | 30 days supply | Qty: 30 | Fill #0

## 2015-12-18 MED FILL — COLCRYS 0.6 MG TABLET: 0.6 | 90 days supply | Qty: 180 | Fill #0

## 2015-12-31 MED FILL — METOPROLOL SUCC ER 50 MG TA: 50 | 30 days supply | Qty: 30 | Fill #1

## 2015-12-31 MED FILL — predniSONE 2.5 MG TABS: 2.5 | 50 days supply | Qty: 50 | Fill #1

## 2015-12-31 MED FILL — FLUOCINONIDE 0.05% CREAM: 0.05 | 30 days supply | Qty: 60 | Fill #1

## 2015-12-31 MED FILL — DULoxetine HCL 30 MG CPEP: 30 | 30 days supply | Qty: 60 | Fill #0

## 2015-12-31 MED FILL — LANSOPRAZOLE DR 15 MG CAP: 15 | 90 days supply | Qty: 90 | Fill #2

## 2016-01-06 HISTORY — PX: IR SI JOINT INJ / ARTH LEFT W/IMAG GUIDE: IMG2368

## 2016-01-06 HISTORY — PX: IR SI JOINT INJ / ARTH RIGHT W/IMAG GUIDE: IMG2369

## 2016-02-04 MED FILL — METOPROLOL SUCC ER 50 MG TA: 50 | 30 days supply | Qty: 30 | Fill #2

## 2016-02-04 MED FILL — FOLIC ACID 1 MG TABLET: 1 | 90 days supply | Qty: 90 | Fill #2

## 2016-02-10 MED FILL — DULoxetine HCL 30 MG CPEP: 30 | 30 days supply | Qty: 60 | Fill #0

## 2016-02-11 ENCOUNTER — Encounter: Payer: Self-pay | Admitting: Cardiovascular Disease

## 2016-02-11 ENCOUNTER — Telehealth: Payer: Self-pay

## 2016-02-11 NOTE — Telephone Encounter (Signed)
Sent via epic 

## 2016-02-11 NOTE — Telephone Encounter (Signed)
Request for surgical clearance:   1. What type surgery is being performed? S1 Joint Fusion  2. When is this surgery scheduled? 03/05/2016  3. Are there any medications that need to be held prior to surgery and how long? N/A; pt takes ASA 81 QD  4. Name of the physician performing surgery: Dr Howell Rucks  5. What is the office phone and fax number?   Phone [phone]  Fax [phone]

## 2016-02-12 MED FILL — predniSONE 5 MG TABS: 5 | 30 days supply | Qty: 45 | Fill #0

## 2016-02-19 NOTE — Telephone Encounter (Signed)
Faxed clearance letter to [facility].

## 2016-03-02 MED FILL — FLUDROCORTISONE 0.1 MG TAB: 0.1 | 90 days supply | Qty: 90 | Fill #4

## 2016-03-02 MED FILL — predniSONE 1 MG TABS: 1 | 30 days supply | Qty: 120 | Fill #2

## 2016-03-02 MED FILL — METOPROLOL SUCC ER 50 MG TA: 50 | 30 days supply | Qty: 30 | Fill #3

## 2016-03-02 MED FILL — DULoxetine HCL 30 MG CPEP: 30 | 30 days supply | Qty: 60 | Fill #1

## 2016-03-03 ENCOUNTER — Other Ambulatory Visit: Payer: Self-pay | Admitting: Cardiovascular Disease

## 2016-03-03 DIAGNOSIS — E2740 Unspecified adrenocortical insufficiency: Secondary | ICD-10-CM

## 2016-03-03 DIAGNOSIS — M352 Behcet's disease: Secondary | ICD-10-CM

## 2016-03-03 NOTE — Telephone Encounter (Signed)
Rx(s) sent to pharmacy electronically.  

## 2016-03-04 ENCOUNTER — Other Ambulatory Visit: Payer: Self-pay

## 2016-03-04 DIAGNOSIS — E2740 Unspecified adrenocortical insufficiency: Secondary | ICD-10-CM

## 2016-03-04 DIAGNOSIS — M352 Behcet's disease: Secondary | ICD-10-CM

## 2016-03-04 MED ORDER — METOPROLOL SUCCINATE ER 50 MG PO TB24: 50.0000 mg | ORAL_TABLET | Freq: Every day | ORAL | 1 refills | Status: DC

## 2016-03-17 MED FILL — predniSONE 5 MG TABS: 5 | 90 days supply | Qty: 180 | Fill #0

## 2016-03-17 MED FILL — COLCRYS 0.6 MG TABLET: 0.6 | 90 days supply | Qty: 180 | Fill #0

## 2016-04-06 MED FILL — DULoxetine HCL 30 MG CPEP: 30 | 30 days supply | Qty: 60 | Fill #2

## 2016-04-06 MED FILL — LANSOPRAZOLE DR 15 MG CAP: 15 | 90 days supply | Qty: 90 | Fill #3

## 2016-04-06 MED FILL — METOPROLOL SUCC ER 50 MG TA: 50 | 30 days supply | Qty: 30 | Fill #4

## 2016-04-07 IMAGING — CR DG CERVICAL SPINE 2 OR 3 VIEWS
2 series · 2 of 2 positions shown · non-contrast
Comparison: MRI cervical spine 07/18/2013. Flexion and extension
cervical spine imaging 07/24/2013.

CLINICAL DATA: C4 through C6 fusion.

EXAM:
OPERATIVE CERVICAL SPINE - 2-3 VIEW

[lat (1 of 2)]
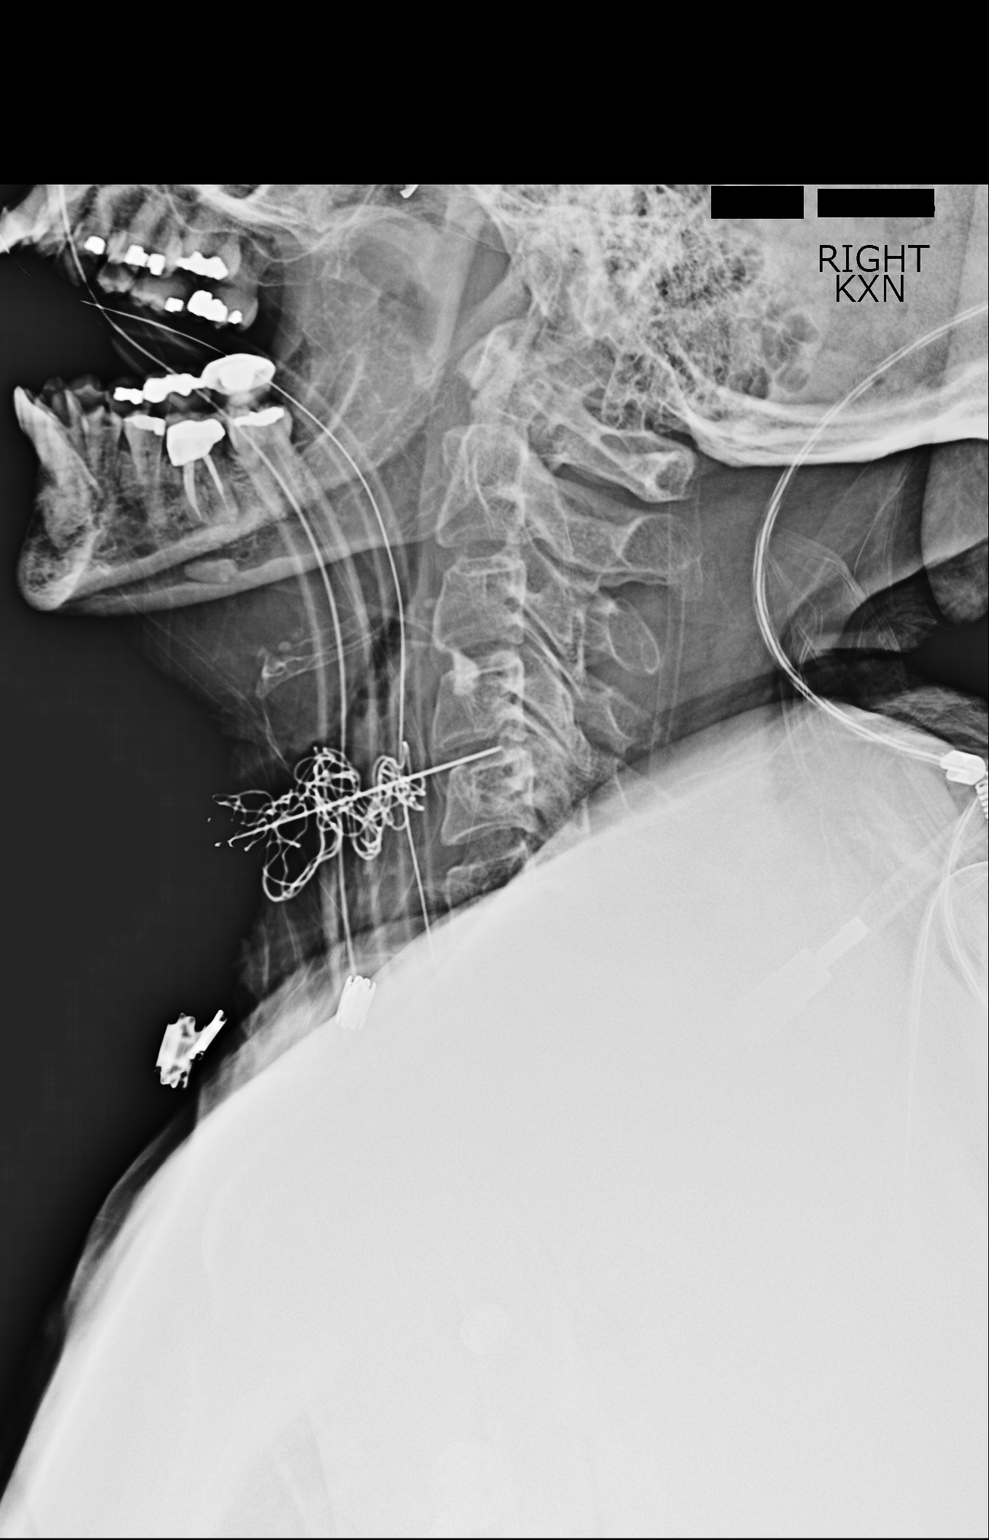

[lat (2 of 2)]
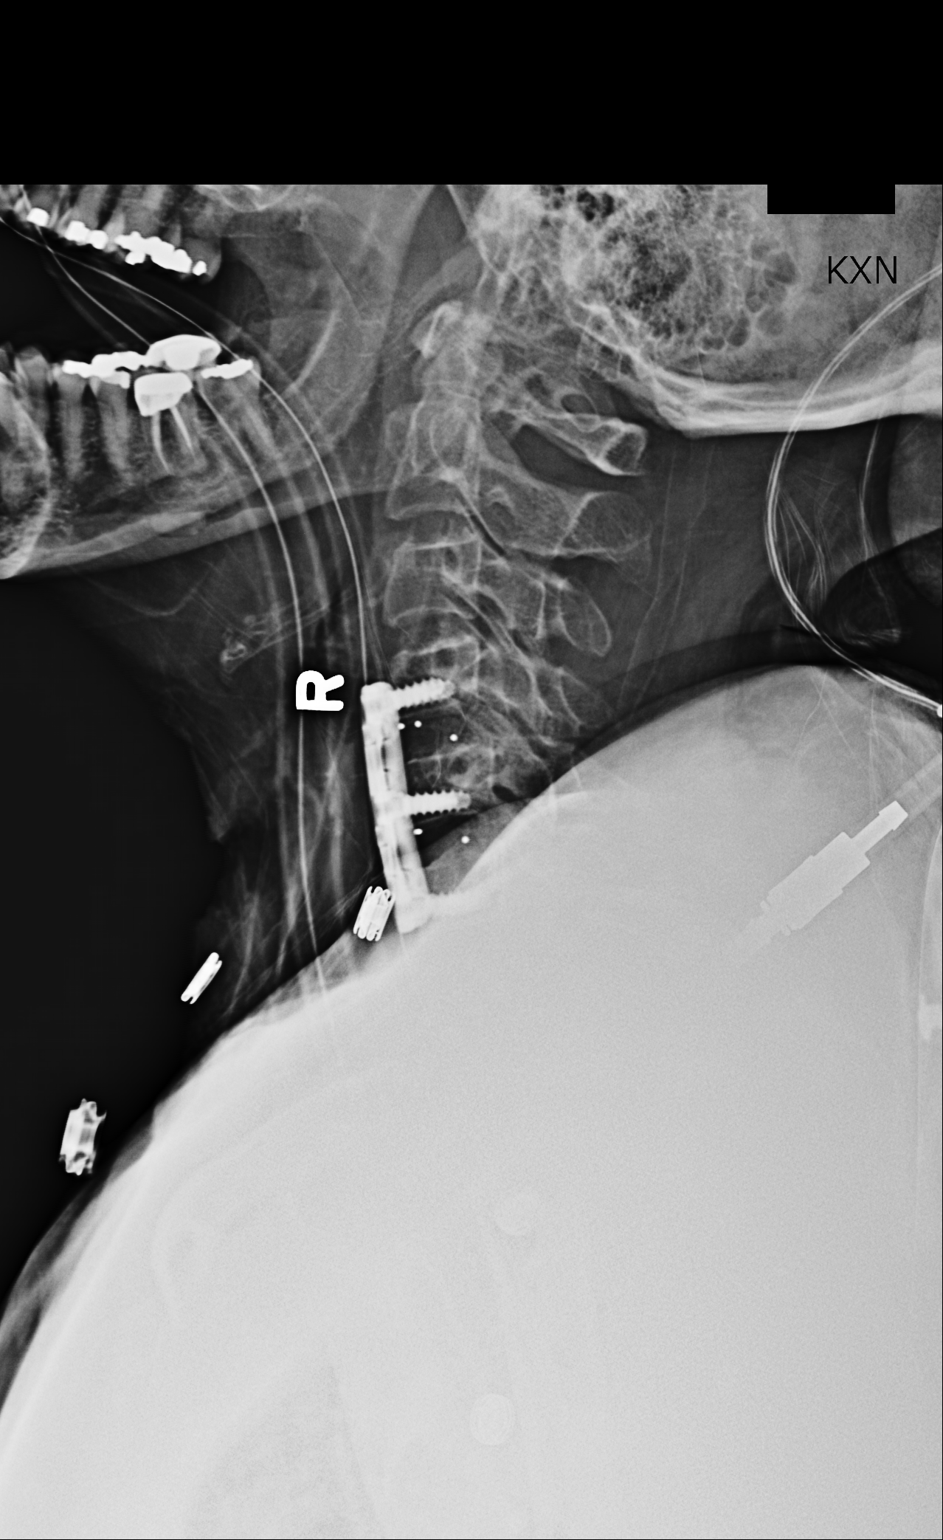

[2 of 2 positions shown; findings below may reference images not displayed]

FINDINGS: Initial image obtained at 2342 hr and submitted for interpretation
postoperatively demonstrates a localizer needle projected over the
C4-5 disc space. 2nd image obtained at 6920 hr and submitted for
interpretation postoperatively demonstrates ACDF with hardware from
C4 through C6 with interbody fusion plugs at C4-5 and C5-6. Plugs
are appropriately positioned anteriorly in the disc space. No acute
complicating features.
IMPRESSION: ACDF with hardware C4 through C6 with interbody fusion plugs at C4-5
and C5-6 without acute complicating features.

## 2016-04-28 MED FILL — FLUTICASONE PROP 50 MCG SPR: 50 | 30 days supply | Qty: 16 | Fill #3

## 2016-05-05 MED FILL — METOPROLOL SUCC ER 50 MG TA: 50 | 30 days supply | Qty: 30 | Fill #5

## 2016-05-05 MED FILL — FLUOCINONIDE 0.05% CREAM: 0.05 | 30 days supply | Qty: 60 | Fill #2

## 2016-05-05 MED FILL — METHOTREXATE 25 MG/ML VIAL: 50 | 28 days supply | Qty: 4 | Fill #0

## 2016-05-05 MED FILL — FOLIC ACID 1 MG TABLET: 1 | 45 days supply | Qty: 90 | Fill #0

## 2016-05-05 MED FILL — DULoxetine HCL 30 MG CPEP: 30 | 30 days supply | Qty: 60 | Fill #3

## 2016-06-02 MED FILL — METOPROLOL SUCC ER 50 MG TA: 50 | 30 days supply | Qty: 30 | Fill #6

## 2016-06-02 MED FILL — DULoxetine HCL 30 MG CPEP: 30 | 30 days supply | Qty: 60 | Fill #0

## 2016-06-02 MED FILL — FLUDROCORTISONE 0.1 MG TAB: 0.1 | 90 days supply | Qty: 90 | Fill #0 | Status: TO

## 2016-06-02 MED FILL — COLCRYS 0.6 MG TABLET: 0.6 | 30 days supply | Qty: 60 | Fill #0

## 2016-06-16 MED FILL — predniSONE 1 MG TABS: 1 | 30 days supply | Qty: 120 | Fill #3

## 2016-06-16 MED FILL — predniSONE 5 MG TABS: 5 | 90 days supply | Qty: 180 | Fill #0

## 2016-07-01 ENCOUNTER — Other Ambulatory Visit: Payer: Self-pay | Admitting: Internal Medicine

## 2016-07-01 DIAGNOSIS — Z87448 Personal history of other diseases of urinary system: Secondary | ICD-10-CM

## 2016-07-06 MED FILL — METOPROLOL SUCC ER 50 MG TA: 50 | 30 days supply | Qty: 30 | Fill #7

## 2016-07-06 MED FILL — COLCRYS 0.6 MG TABLET: 0.6 | 30 days supply | Qty: 60 | Fill #1

## 2016-07-06 MED FILL — DULoxetine HCL 30 MG CPEP: 30 | 30 days supply | Qty: 60 | Fill #1

## 2016-07-07 MED FILL — LANSOPRAZOLE DR 15 MG CAP: 15 | 30 days supply | Qty: 30 | Fill #0

## 2016-07-16 ENCOUNTER — Ambulatory Visit (HOSPITAL_COMMUNITY)
Admission: RE | Admit: 2016-07-16 | Discharge: 2016-07-16 | Disposition: A | Discharge status: Home / Self Care | Payer: Medicare Other | Source: Ambulatory Visit | Attending: Internal Medicine | Admitting: Internal Medicine

## 2016-07-16 DIAGNOSIS — N281 Cyst of kidney, acquired: Secondary | ICD-10-CM | POA: Insufficient documentation

## 2016-07-16 DIAGNOSIS — Z87448 Personal history of other diseases of urinary system: Secondary | ICD-10-CM | POA: Diagnosis present

## 2016-07-28 MED FILL — HYDROCODON-APAP 10-325: 10-325 | 7 days supply | Qty: 40 | Fill #0

## 2016-07-31 MED FILL — ONDANSETRON ODT 4 MG TABLET: 4 | 3 days supply | Qty: 20 | Fill #0

## 2016-08-03 MED FILL — COLCRYS 0.6 MG TABLET: 0.6 | 30 days supply | Qty: 60 | Fill #2

## 2016-08-03 MED FILL — DULoxetine HCL 30 MG CPEP: 30 | 30 days supply | Qty: 60 | Fill #2

## 2016-08-03 MED FILL — LANSOPRAZOLE DR 15 MG CAP: 15 | 30 days supply | Qty: 30 | Fill #1

## 2016-08-03 MED FILL — METOPROLOL SUCC ER 50 MG TA: 50 | 30 days supply | Qty: 30 | Fill #8

## 2016-08-31 MED FILL — FLUDROCORTISONE 0.1 MG TAB: 0.1 | 90 days supply | Qty: 90 | Fill #1 | Status: TO

## 2016-08-31 MED FILL — DULoxetine HCL 30 MG CPEP: 30 | 30 days supply | Qty: 60 | Fill #3

## 2016-08-31 MED FILL — FOLIC ACID 1 MG TABLET: 1 | 45 days supply | Qty: 90 | Fill #1

## 2016-08-31 MED FILL — LANSOPRAZOLE DR 15 MG CAP: 15 | 30 days supply | Qty: 30 | Fill #2

## 2016-08-31 MED FILL — METOPROLOL SUCC ER 50 MG TA: 50 | 30 days supply | Qty: 30 | Fill #9

## 2016-09-16 MED FILL — COLCRYS 0.6 MG TABLET: 0.6 | 30 days supply | Qty: 60 | Fill #0

## 2016-09-23 ENCOUNTER — Telehealth: Payer: Self-pay

## 2016-09-23 ENCOUNTER — Encounter: Payer: Self-pay | Admitting: Cardiovascular Disease

## 2016-09-23 NOTE — Telephone Encounter (Signed)
epicd 

## 2016-09-23 NOTE — Telephone Encounter (Signed)
1. Type of surgery: right knee makoplasty 2. Date of surgery: tentatively 11/20/16 3. Surgeon: Dr Cassell Smiles 4. Medications that need to be held & how long: N/A 5. Fax and/or Phone: (p) [phone] (f) [phone]

## 2016-09-30 MED FILL — METOPROLOL SUCC ER 50 MG TA: 50 | 30 days supply | Qty: 30 | Fill #10

## 2016-09-30 MED FILL — LANSOPRAZOLE DR 15 MG CAP: 15 | 30 days supply | Qty: 30 | Fill #3

## 2016-09-30 MED FILL — DULoxetine HCL 30 MG CPEP: 30 | 30 days supply | Qty: 60 | Fill #0

## 2016-10-14 MED FILL — FOLIC ACID 1 MG TABLET: 1 | 45 days supply | Qty: 90 | Fill #2

## 2016-10-14 MED FILL — COLCRYS 0.6 MG TABLET: 0.6 | 30 days supply | Qty: 60 | Fill #1

## 2016-10-14 MED FILL — predniSONE 5 MG TABS: 5 | 90 days supply | Qty: 180 | Fill #1

## 2016-11-01 MED FILL — METOPROLOL SUCC ER 50 MG TA: 50 | 30 days supply | Qty: 30 | Fill #11

## 2016-11-01 MED FILL — DULoxetine HCL 30 MG CPEP: 30 | 30 days supply | Qty: 60 | Fill #1

## 2016-11-02 MED FILL — LANSOPRAZOLE DR 15 MG CAP: 15 | 30 days supply | Qty: 30 | Fill #0

## 2016-11-05 HISTORY — PX: JOINT REPLACEMENT: SHX530

## 2016-11-06 ENCOUNTER — Telehealth: Payer: Self-pay | Admitting: Cardiovascular Disease

## 2016-11-06 NOTE — Telephone Encounter (Signed)
Faxed to Dr Odis Luster' office via Epic.

## 2016-11-06 NOTE — Telephone Encounter (Signed)
New message    [facility] calling in regards to epic surgery clearance received 9/19. Patient is scheduled for surgery 11/14. Office is requesting clarification or updated clearance. Please [phone] ext [phone] Cordelia Pen

## 2016-11-09 NOTE — Telephone Encounter (Signed)
Called [facility] and left Cordelia Pen a message in regards to what clarification she needs for patients cardiac clearance. Awaiting a call back.

## 2016-11-12 NOTE — Telephone Encounter (Signed)
Spoke with Cordelia Pen at [facility] and she has clarified that she has the clearance that she needs for the pt to have her upcoming surgery 01/19/16 and no other assistance needed.

## 2016-11-15 MED FILL — COLCRYS 0.6 MG TABLET: 0.6 | 30 days supply | Qty: 60 | Fill #2

## 2016-11-19 MED FILL — HYDROCODON-APAP 10-325: 10-325 | 5 days supply | Qty: 30 | Fill #0

## 2016-11-30 ENCOUNTER — Other Ambulatory Visit: Payer: Self-pay | Admitting: Cardiovascular Disease

## 2016-11-30 DIAGNOSIS — M352 Behcet's disease: Secondary | ICD-10-CM

## 2016-11-30 DIAGNOSIS — E2740 Unspecified adrenocortical insufficiency: Secondary | ICD-10-CM

## 2016-11-30 MED FILL — FLUDROCORTISONE 0.1 MG TAB: 0.1 | 90 days supply | Qty: 90 | Fill #2 | Status: TO

## 2016-11-30 MED FILL — LANSOPRAZOLE DR 15 MG CAP: 15 | 30 days supply | Qty: 30 | Fill #1

## 2016-11-30 MED FILL — DULoxetine HCL 30 MG CPEP: 30 | 30 days supply | Qty: 60 | Fill #2

## 2016-11-30 MED FILL — METOPROLOL SUCC ER 50 MG TA: 50 | 30 days supply | Qty: 30 | Fill #0

## 2016-11-30 NOTE — Telephone Encounter (Signed)
REFILL 

## 2016-12-15 MED FILL — COLCRYS 0.6 MG TABLET: 0.6 | 30 days supply | Qty: 60 | Fill #3

## 2016-12-18 ENCOUNTER — Ambulatory Visit (INDEPENDENT_AMBULATORY_CARE_PROVIDER_SITE_OTHER): Payer: Medicare Other | Admitting: Cardiovascular Disease

## 2016-12-18 ENCOUNTER — Encounter: Payer: Self-pay | Admitting: Cardiovascular Disease

## 2016-12-18 VITALS — BP 117/66 | HR 76 | Ht 65.0 in | Wt 171.0 lb

## 2016-12-18 DIAGNOSIS — M352 Behcet's disease: Secondary | ICD-10-CM | POA: Diagnosis not present

## 2016-12-18 DIAGNOSIS — I951 Orthostatic hypotension: Secondary | ICD-10-CM | POA: Diagnosis not present

## 2016-12-18 DIAGNOSIS — E2740 Unspecified adrenocortical insufficiency: Secondary | ICD-10-CM | POA: Diagnosis not present

## 2016-12-18 DIAGNOSIS — R002 Palpitations: Secondary | ICD-10-CM

## 2016-12-18 MED ORDER — METOPROLOL SUCCINATE ER 50 MG PO TB24: 50.0000 mg | ORAL_TABLET | Freq: Every day | ORAL | 3 refills | Status: DC

## 2016-12-18 NOTE — Progress Notes (Signed)
Cardiology Office Note    Date:  12/18/2016   ID:  [name], DOB September 17, 1960, MRN [id number]  PCP:  [name], Lynn D, MD  Cardiologist:   Thurmon FairMihai Shaunice Levitan, MD   No chief complaint on file.   History of Present Illness:  [name] is a 56 y.o. female with problems with orthostatic tachycardia and hypotension, at least in part related to adrenal insufficiency. She has numerous other medical noncardiac problems including Behcet's syndrome severe sacroiliac joint disease, gastroesophageal reflux disease, colitis with recurrent diarrhea.   She has had problems with significant tachycardia when she develops hypovolemia and orthostatic hypotension, often associated with worsening bouts of diarrhea.  She has discovered the symptoms are better if she avoids gluten, although tests for gluten sensitivity have been negative.  She has also devised her own fairly complicated system of adjusting prednisone dosage to maintain stability of her GI tract.  All told, her cardiac problems have not been an issue as long as she can keep her GI complaints under control.  She has undergone right knee and sacroiliac joint surgeries and is doing much better with her mobility.  She uses a cane for stability.  She no longer he needs a wheelchair.  She denies exertional angina or dyspnea, recent palpitations, syncope, focal neurological deficits, intermittent claudication, leg edema.  Past Medical History:  Diagnosis Date  . Acid reflux   . Adrenal insufficiency (HCC)   . Asthma   . Behcet's syndrome (HCC)   . Chronic back pain   . Chronic neck pain   . Colitis   . GERD (gastroesophageal reflux disease)   . IBS (irritable bowel syndrome)   . Osteoporosis    T score reported -2.7  . Pneumonia    20 years ago  . PONV (postoperative nausea and vomiting)    also low blood pressure at times  . Refusal of blood transfusions as patient is Jehovah's Witness   . Subjective visual disturbance  07/14/2012  . Tear of lateral meniscus of right knee, current    Tear of lateral & medial meniscus of right knee  . Tinnitus    left ear sees Dr Ezzard StandingNewman (ENT)  . Uveitis     Past Surgical History:  Procedure Laterality Date  . ANTERIOR CERVICAL DECOMP/DISCECTOMY FUSION N/A 08/22/2013   Procedure: ANTERIOR CERVICAL DECOMPRESSION/DISCECTOMY FUSION 2 LEVELS;  Surgeon: Karn CassisErnesto M Botero, MD;  Location: [facility];  Service: Neurosurgery;  Laterality: N/A;  C4-5 C5-6 Anterior cervical decompression/diskectomy/fusion  . ANTERIOR FUSION CERVICAL SPINE  08/22/2013   c4 5  6         . BACK SURGERY    . BREAST SURGERY     Cysts excised  . CARDIAC CATHETERIZATION  09/18/2013  . CESAREAN SECTION  03/2001  . CHOLECYSTECTOMY  12/2004  . COLONOSCOPY  03/2013  . DILATION AND CURETTAGE OF UTERUS    . HAND SURGERY    . HYSTEROSCOPY    . KNEE ARTHROSCOPY WITH LATERAL MENISECTOMY Right 12/12/2012   Procedure: KNEE ARTHROSCOPY WITH LATERAL MENISECTOMY;  Surgeon: Lynn Simmerobert A Wainer, MD;  Location: [facility];  Service: Orthopedics;  Laterality: Right;  . KNEE SURGERY Left 05/1998  . LEFT HEART CATHETERIZATION WITH CORONARY ANGIOGRAM N/A 09/18/2013   Procedure: LEFT HEART CATHETERIZATION WITH CORONARY ANGIOGRAM;  Surgeon: Micheline ChapmanMichael [name] Cooper, MD;  [facility];  Service: Cardiovascular;  Laterality: N/A;  . OOPHORECTOMY     BSO  . PELVIC LAPAROSCOPY  DL  . THYROID CYST EXCISION    . VAGINAL HYSTERECTOMY  07/2007   LAVH BSO    Current Medications: Outpatient Medications Prior to Visit  Medication Sig Dispense Refill  . acetaminophen (TYLENOL) 500 MG tablet Take 500 mg by mouth every 8 (eight) hours as needed.    Marland Kitchen albuterol (PROVENTIL HFA;VENTOLIN HFA) 108 (90 BASE) MCG/ACT inhaler Inhale 1-2 puffs into the lungs every 6 (six) hours as needed for wheezing or shortness of breath.    . Artificial Saliva (NEUTRASAL) PACK 538 mg.    . aspirin EC 325 MG tablet Take 325 mg by mouth daily.      . cholecalciferol (VITAMIN [name]) 1000 UNITS tablet Take 1,000 Units by mouth daily.    . colchicine 0.6 MG tablet Take 0.6 mg by mouth daily. Colcrys    . diphenhydrAMINE (BENADRYL) 25 mg capsule Take 25 mg by mouth as needed (at bedtime).    Marland Kitchen docusate sodium (STOOL SOFTENER) 100 MG capsule Take 100 mg by mouth daily.    . DULoxetine (CYMBALTA) 30 MG capsule Take 60 mg by mouth daily.     . fludrocortisone (FLORINEF) 0.1 MG tablet Take 1 tablet (0.1 mg total) by mouth daily. 30 tablet 0  . fluocinonide cream (LIDEX) 0.05 %     . fluticasone (FLONASE) 50 MCG/ACT nasal spray Place into the nose.    . folic acid (FOLVITE) 1 MG tablet Take 2 mg by mouth daily.    Marland Kitchen ibuprofen (ADVIL,MOTRIN) 200 MG tablet Take 200 mg by mouth as needed (at bedtime).    . inFLIXimab (REMICADE) 100 MG injection Inject into the vein.    Marland Kitchen lansoprazole (PREVACID) 15 MG capsule Take 15 mg by mouth at bedtime.     . methotrexate 50 MG/2ML injection Inject 0.5 mg into the skin once a week.     . Oral Electrolytes (THERMOTABS PO) Take by mouth.    . predniSONE (DELTASONE) 1 MG tablet Take 1 mg by mouth 3 (three) times daily.    . predniSONE (DELTASONE) 5 MG tablet Take 10 mg by mouth daily with breakfast.     . Propylene Glycol-Glycerin (SOOTHE OP) Apply 1 drop to eye 3 (three) times daily as needed (scratchy/blurry eyes).    . Teriparatide, Recombinant, (FORTEO) 600 MCG/2.4ML SOLN Inject 20 mcg into the skin at bedtime.    . metoprolol succinate (TOPROL-XL) 50 MG 24 hr tablet TAKE 1 TABLET BY MOUTH ONCE DAILY 30 tablet 2  . acetaminophen (TYLENOL) 650 MG CR tablet Take 650 mg by mouth every 8 (eight) hours as needed for pain.    Marland Kitchen inFLIXimab (REMICADE) 100 MG injection Inject into the vein.    . Pancrelipase, Lip-Prot-Amyl, 24000-76000 units CPEP Take by mouth.     No facility-administered medications prior to visit.      Allergies:   Celebrex [celecoxib]; Latex; Sulfa drugs cross reactors; Sulfites; Fentanyl;  Loratadine; Simponi [golimumab]; Epinephrine; Erythromycin; and Morphine and related   Social History   Socioeconomic History  . Marital status: Divorced    Spouse name: None  . Number of children: 1  . Years of education: None  . Highest education level: None  Social Needs  . Financial resource strain: None  . Food insecurity - worry: None  . Food insecurity - inability: None  . Transportation needs - medical: None  . Transportation needs - non-medical: None  Occupational History    Employer: [employer]  Tobacco Use  . Smoking status: Never Smoker  .  Smokeless tobacco: Never Used  Substance and Sexual Activity  . Alcohol use: No    Alcohol/week: 0.0 oz  . Drug use: No  . Sexual activity: No    Birth control/protection: Surgical    Comment: 1st intercourse 22 yo-5 partners  Other Topics Concern  . None  Social History Narrative  . None     Family History:  The patient's family history includes Breast cancer in her mother and sister; Diabetes in her mother and sister; Gastric cancer in her father; Hypertension in her sister; Stroke in her mother.   ROS:   Please see the history of present illness.    ROS All other systems reviewed and are negative.   PHYSICAL EXAM:   VS:  BP 117/66   Pulse 76   Ht 5\' 5"  (1.651 m)   Wt 171 lb (77.6 kg)   BMI 28.46 kg/m     General: Alert, oriented x3, no distress, mildly overweight Head: no evidence of trauma, PERRL, EOMI, no exophtalmos or lid lag, no myxedema, no xanthelasma; normal ears, nose and oropharynx Neck: normal jugular venous pulsations and no hepatojugular reflux; brisk carotid pulses without delay and no carotid bruits Chest: clear to auscultation, no signs of consolidation by percussion or palpation, normal fremitus, symmetrical and full respiratory excursions Cardiovascular: normal position and quality of the apical impulse, regular rhythm, normal first and second heart sounds, no murmurs, rubs or gallops Abdomen:  no tenderness or distention, no masses by palpation, no abnormal pulsatility or arterial bruits, normal bowel sounds, no hepatosplenomegaly Extremities: no clubbing, cyanosis or edema; 2+ radial, ulnar and brachial pulses bilaterally; 2+ right femoral, posterior tibial and dorsalis pedis pulses; 2+ left femoral, posterior tibial and dorsalis pedis pulses; no subclavian or femoral bruits Neurological: grossly nonfocal Psych: Normal mood and affect   Wt Readings from Last 3 Encounters:  12/18/16 171 lb (77.6 kg)  10/16/15 165 lb 6.4 oz (75 kg)  05/01/15 170 lb (77.1 kg)      Studies/Labs Reviewed:   EKG:  EKG is ordered today.  It shows normal sinus rhythm, normal tracing Lipid Panel    Component Value Date/Time   CHOL 175 09/18/2013 1030   TRIG 77 09/18/2013 1030   HDL 52 09/18/2013 1030   CHOLHDL 3.4 09/18/2013 1030   VLDL 15 09/18/2013 1030   LDLCALC 108 (H) 09/18/2013 1030     ASSESSMENT:    1. Orthostatic hypotension   2. Palpitations   3. Behcet's syndrome (HCC)   4. Adrenal insufficiency (HCC)      PLAN:  In order of problems listed above:  1.  Orthostatic hypotension: Related to hypovolemia from iatrogenic adrenal insufficiency and recurrence GI volume loss, now well compensated on fludrocortisone and improve treatment of her GI condition. 2.  Palpitations: She is not bothered by these on her current metoprolol prescription. No changes made. 3.  Behcet's: Seems to be well compensated 4.  Iatrogenic adrenal insufficiency reminded her of the risk of volume depletion, electrolyte imbalances, need for stress dose steroids with serious illness.    Medication Adjustments/Labs and Tests Ordered: Current medicines are reviewed at length with the patient today.  Concerns regarding medicines are outlined above.  Medication changes, Labs and Tests ordered today are listed in the Patient Instructions below. Patient Instructions  Dr 09/20/2013 recommends that you schedule a  follow-up appointment in 12 months. You will receive a reminder letter in the mail two months in advance. If you don't receive a letter, please call  our office to schedule the follow-up appointment.  If you need a refill on your cardiac medications before your next appointment, please call your pharmacy.    Signed, Thurmon Fair, MD  12/18/2016 10:50 AM    [facility] [address] [place], [place], [place]  [address] Phone: [phone]; Fax: [phone]

## 2016-12-18 NOTE — Patient Instructions (Signed)
Dr Croitoru recommends that you schedule a follow-up appointment in 12 months. You will receive a reminder letter in the mail two months in advance. If you don't receive a letter, please call our office to schedule the follow-up appointment.  If you need a refill on your cardiac medications before your next appointment, please call your pharmacy. 

## 2016-12-30 MED FILL — METOPROLOL SUCC ER 50 MG TA: 50 | 30 days supply | Qty: 30 | Fill #1

## 2016-12-30 MED FILL — DULoxetine HCL 30 MG CPEP: 30 | 30 days supply | Qty: 60 | Fill #3

## 2016-12-30 MED FILL — LANSOPRAZOLE DR 15 MG CAP: 15 | 30 days supply | Qty: 30 | Fill #2

## 2016-12-30 MED FILL — FOLIC ACID 1 MG TABLET: 1 | 45 days supply | Qty: 90 | Fill #3

## 2017-01-01 MED FILL — METHOTREXATE 25 MG/ML VIAL: 50 | 30 days supply | Qty: 4 | Fill #0

## 2017-01-05 DIAGNOSIS — N281 Cyst of kidney, acquired: Secondary | ICD-10-CM

## 2017-01-05 DIAGNOSIS — I8003 Phlebitis and thrombophlebitis of superficial vessels of lower extremities, bilateral: Secondary | ICD-10-CM

## 2017-01-05 DIAGNOSIS — H539 Unspecified visual disturbance: Secondary | ICD-10-CM

## 2017-01-05 HISTORY — DX: Phlebitis and thrombophlebitis of superficial vessels of lower extremities, bilateral: I80.03

## 2017-01-05 HISTORY — DX: Unspecified visual disturbance: H53.9

## 2017-01-05 HISTORY — DX: Cyst of kidney, acquired: N28.1

## 2017-01-20 MED FILL — predniSONE 5 MG TABS: 5 | 90 days supply | Qty: 180 | Fill #0

## 2017-01-20 MED FILL — predniSONE 1 MG TABS: 1 | 90 days supply | Qty: 90 | Fill #0

## 2017-01-20 MED FILL — DULoxetine HCL 60 MG CPEP: 60 | 90 days supply | Qty: 90 | Fill #0

## 2017-01-21 MED FILL — LANSOPRAZOLE 30 MG CAP: 30 | 30 days supply | Qty: 30 | Fill #0

## 2017-01-27 MED FILL — METOPROLOL SUCC ER 50 MG TA: 50 | 30 days supply | Qty: 30 | Fill #2

## 2017-01-27 MED FILL — COLCRYS 0.6 MG TABLET: 0.6 | 30 days supply | Qty: 60 | Fill #4

## 2017-02-23 MED FILL — LANSOPRAZOLE 30 MG CAP: 30 | 30 days supply | Qty: 30 | Fill #1

## 2017-02-23 MED FILL — COLCRYS 0.6 MG TABLET: 0.6 | 30 days supply | Qty: 60 | Fill #5

## 2017-02-23 MED FILL — FOLIC ACID 1 MG TABLET: 1 | 90 days supply | Qty: 180 | Fill #0

## 2017-02-23 MED FILL — METOPROLOL SUCC ER 50 MG TA: 50 | 90 days supply | Qty: 90 | Fill #0

## 2017-03-31 MED FILL — LANSOPRAZOLE 30 MG CAP: 30 | 30 days supply | Qty: 30 | Fill #2

## 2017-03-31 MED FILL — AMOXICILLIN 500 MG CAPSULE: 500 | 6 days supply | Qty: 25 | Fill #0

## 2017-04-01 MED FILL — COLCRYS 0.6 MG TABLET: 0.6 | 30 days supply | Qty: 60 | Fill #0

## 2017-04-29 MED FILL — COLCRYS 0.6 MG TABLET: 0.6 | 30 days supply | Qty: 60 | Fill #1

## 2017-04-29 MED FILL — METHOTREXATE 25 MG/ML VIAL: 50 | 30 days supply | Qty: 4 | Fill #1

## 2017-04-29 MED FILL — LANSOPRAZOLE 30 MG CAP: 30 | 30 days supply | Qty: 30 | Fill #3

## 2017-04-29 MED FILL — DULoxetine HCL 60 MG CPEP: 60 | 90 days supply | Qty: 90 | Fill #1

## 2017-04-29 MED FILL — predniSONE 5 MG TABS: 5 | 90 days supply | Qty: 180 | Fill #1

## 2017-05-14 MED FILL — FLUTICASONE PROP 50 MCG SPR: 50 | 60 days supply | Qty: 16 | Fill #0

## 2017-05-14 MED FILL — AMOX TR-K CLV 875-125 MG TA: 875-125 | 10 days supply | Qty: 20 | Fill #0

## 2017-05-14 MED FILL — VENTOLIN HFA 90 MCG INHALER: 108 (90 BAS | 33 days supply | Qty: 18 | Fill #0

## 2017-05-26 MED FILL — FOLIC ACID 1 MG TABS: 1 | 90 days supply | Qty: 180 | Fill #1

## 2017-05-26 MED FILL — METOPROLOL SUCCINATE ER 50: 50 | 90 days supply | Qty: 90 | Fill #0

## 2017-05-26 MED FILL — COLCRYS 0.6 MG TABLET: 0.6 | 30 days supply | Qty: 60 | Fill #2

## 2017-05-26 MED FILL — METHOTREXATE 25 MG/ML VIAL: 50 | 28 days supply | Qty: 4 | Fill #2

## 2017-06-04 MED FILL — LANSOPRAZOLE 30 MG CAP: 30 | 30 days supply | Qty: 30 | Fill #4

## 2017-06-30 MED FILL — METHOTREXATE 25 MG/ML VIAL: 50 | 28 days supply | Qty: 4 | Fill #3

## 2017-06-30 MED FILL — COLCRYS 0.6 MG TABLET: 0.6 | 30 days supply | Qty: 60 | Fill #0

## 2017-07-16 MED FILL — LANSOPRAZOLE 30 MG CAP: 30 | 30 days supply | Qty: 30 | Fill #5

## 2017-07-16 MED FILL — predniSONE 5 MG TABS: 5 | 90 days supply | Qty: 180 | Fill #2

## 2017-07-16 MED FILL — FLUTICASONE PROP 50 MCG SPR: 50 | 60 days supply | Qty: 16 | Fill #1

## 2017-07-29 MED FILL — COLCRYS 0.6 MG TABLET: 0.6 | 30 days supply | Qty: 60 | Fill #1

## 2017-07-30 MED FILL — DULoxetine HCL 60 MG CPEP: 60 | 90 days supply | Qty: 90 | Fill #2

## 2017-08-11 ENCOUNTER — Ambulatory Visit: Payer: Self-pay | Admitting: General Surgery

## 2017-08-11 NOTE — H&P (Signed)
PATIENT PROFILE: [name] is a 57 y.o. female who presents to the Clinic for consultation at the request of Dr. Edwina Barth for evaluation of right axillary soft tissue.  PCP:  Velna Ochs, MD  HISTORY OF PRESENT ILLNESS: [name] reports she has felt that soft tissue mass on her right axilla since ~8 years ago. The patient refers it was small at the beginning but it has been getting bigger recently. It is giving her problems with shaving causing bleeding. It got swollen recently after knee surgery were she needed crutches. No pain radiation. Minimal pain aggravated when pressure is applied. Pain improves spontaneously. Uncomfortable these days with the heat. Denies secretions or previous infection.    PROBLEM LIST:         Problem List  Date Reviewed: 06/29/2017         Noted   Inflammatory arthritis 01/20/2017   High risk medications (not anticoagulants) long-term use 01/20/2017   Adrenal insufficiency (CMS-HCC) 01/20/2017   H/O orthostatic hypotension 12/22/2016   Renal cyst, left 12/22/2016   Gluten intolerance 12/22/2016   Combined hyperlipidemia 02/06/2016   Medication management 08/29/2015   Steroid-induced osteoporosis 05/16/2015   Foot drop, left 05/16/2015   History of colon polyps 06/26/2013   Refusal of blood transfusions as patient is Jehovah's Witness 03/06/2013   Sickle cell trait (CMS-HCC) 03/06/2013   Behcet's disease (CMS-HCC) (Chronic) 10/03/2012   GERD (gastroesophageal reflux disease) (Chronic) 10/03/2012   Long-term use of immunosuppressant medication (Chronic) 10/03/2012   Diarrhea, unspecified 10/03/2012   Uveitis 10/03/2012      GENERAL REVIEW OF SYSTEMS:   General ROS: negative for - chills, fever, weight gain or weight loss. Positive for fatigue and night sweats.  Allergy and Immunology ROS: negative for - hives  Hematological and Lymphatic ROS: positive for - bleeding problems or bruising, negative for palpable  nodes Endocrine ROS: negative for - heat or cold intolerance, hair changes Respiratory ROS: positive for - cough, shortness of breath or wheezing Cardiovascular ROS: no chest pain. Positive for palpitations GI ROS: negative for nausea, vomiting, abdominal pain, diarrhea, constipation Musculoskeletal ROS: Positive for - joint swelling or muscle pain Neurological ROS: negative for - confusion, syncope. Positive for headaches.  Dermatological ROS: negative for pruritus and rash. Positive for axillary soft tissue mass.  Psychiatric: negative for anxiety, depression, difficulty sleeping and memory loss  MEDICATIONS: CurrentMedications        Current Outpatient Medications  Medication Sig Dispense Refill  . acetaminophen (TYLENOL EXTRA STRENGTH) 500 MG tablet Tylenol Extra Strength 500 mg tablet   1 tablet 3 times a day by oral route.    Marland Kitchen albuterol (VENTOLIN HFA) 90 mcg/actuation inhaler Inhale 1 inhalation into the lungs every 4 (four) hours as needed for Wheezing 1 Inhaler 6  . allantoin-onion-pegs-water (SCAR GEL) Gel Apply topically 2 (two) times daily Scar Away 100% silcone Gel    . aluminum-magnesium hydroxide-simethicone, DRAH, (MAALOX ADVANCED) 200-200-20 mg/5 mL suspension Maalox Advanced 200 mg-200 mg-20 mg/5 mL oral suspension   10 mL every day by oral route.    Marland Kitchen amoxicillin (AMOXIL) 500 MG capsule Take 500 mg by mouth every 6 (six) hours    . aspirin 325 MG tablet Take 325 mg by mouth 2 (two) times daily    . CALCIUM-MAGNESIUM-ZINC ORAL Take 1 tablet by mouth 3 (three) times a day    . cholecalciferol (VITAMIN D3) 1,000 unit capsule Take 1,000 Units by mouth once daily.    . colchicine (COLCRYS-BRAND) 0.6 mg  tablet TAKE 1 TABLET BY MOUTH 2 TIMES DAILY. 60 tablet 2  . diphenhydrAMINE (BENADRYL) 25 mg capsule Take 25 mg by mouth nightly      . DULoxetine (CYMBALTA) 60 MG DR capsule Take 1 capsule (60 mg total) by mouth once daily 90 capsule 3  . fludrocortisone  (FLORINEF) 0.1 mg tablet Take 1 tablet (0.1 mg total) by mouth once daily 90 tablet 3  . fluocinonide (LIDEX) 0.05 % cream   2  . fluticasone propionate (FLONASE) 50 mcg/actuation nasal spray Place 1 spray into both nostrils once daily 16 g 6  . folic acid (FOLVITE) 1 MG tablet Take 2 tablets (2 mg total) by mouth once daily 180 tablet 3  . ibuprofen (ADVIL,MOTRIN) 200 MG tablet Take 200 mg by mouth nightly.    . inFLIXimab (REMICADE) 100 mg injection Inject into the vein. Every 8 weeks     . lansoprazole (PREVACID) 30 MG DR capsule Take 1 capsule (30 mg total) by mouth once daily 90 capsule 1  . metoprolol succinate (TOPROL-XL) 50 MG XL tablet Take 50 mg by mouth once daily.    . pen needle, diabetic (UNIFINE PENTIPS) 31 gauge x 3/16" needle Unifine Pentips 31 gauge x 3/16" needle   1 needle every day by miscell. route.    . predniSONE (DELTASONE) 5 MG tablet Take 2 tablets (10 mg total) by mouth once daily 180 tablet 3  . SOD.CHLORID/POTASSIUM CHLORIDE (THERMOTABS ORAL) Take 2 tablets by mouth once as needed      . sodium chloride-aloe vera (AYR SALINE) Gel gel Apply topically as needed.    . teriparatide (FORTEO) 20 mcg/dose - 600 mcg/2.4 mL pen injector Inject 20 mcg subcutaneously once daily    . UNABLE TO FIND Apply 2 drops to eye as needed Med Name: crocodile tears, Arthifical Tears     . UNABLE TO FIND Apply to eye. Med Name: Soothe      No current facility-administered medications for this visit.       ALLERGIES: Fentanyl; Simponi aria [golimumab]; Sulfites; Celebrex [celecoxib]; Epinephrine; Erythromycin; Latex; Loratadine; Morphine; and Sulfa (sulfonamide antibiotics)  PAST MEDICAL HISTORY:     Past Medical History:  Diagnosis Date  . Abdominal bloating 10/03/2012  . Allergic state   . Anemia, unspecified    frequent prior to hysterectomy  . Anxiety    medical issues and life changes  . Asthma without status asthmaticus, unspecified 1986   . Behcet's disease (CMS-HCC)   . Endometriosis   . GERD (gastroesophageal reflux disease)   . Heart murmur, unspecified    when younger  . Osteoarthritis   . Osteoporosis   . Renal cyst, left 12/22/2016  . S/P spinal fusion 12/30/2012  . Sinusitis, unspecified   . Steroid-induced hyperglycemia, unspecified   . Torn meniscus 12/12/2012   lateral and medial meniscus tear; right knee; surgical fixation  . Ulcer    with Behcets flares  . Uveitis     PAST SURGICAL HISTORY:      Past Surgical History:  Procedure Laterality Date  . breast surgery     cyst resection with nipple repair  . BREAST SURGERY  1986   3 benign cyst right breast  . CESAREAN SECTION    . CHOLECYSTECTOMY    . COLONOSCOPY W/BIOPSY N/A 03/07/2013   Procedure: Colonoscopy at [facility] with Moderate Sedation;  Surgeon: Trixie Deis, MD;  Location: [facility];  Service: Gastroenterology;  Laterality: N/A;  . COLONOSCOPY W/REMOVAL LESIONS BY SNARE  03/07/2013   Procedure: COLONOSCOPY, FLEXIBLE; WITH REMOVAL OF TUMOR(S), POLYP(S), OR OTHER LESION(S) BY SNARE TECHNIQUE;  Surgeon: Trixie Deis, MD;  Location: [facility];  Service: Gastroenterology;;  . diskectomy     L5-S1 left side.  Marland Kitchen EXCISION THYROGLOSSAL DUCT CYST/SINUS    . fusion of SI joint  03/05/2016  . HYSTERECTOMY    . left knee surgery     ligament repair for chondromalacia patella  . SPINE SURGERY  8/12 & 12/14   hemilaminectomy/spinal fusion L5-S1  . submandibular mass     path: benign inflammatory mass  . tendon surgery     Dequervain's bilaterally     FAMILY HISTORY:      Family History  Problem Relation Age of Onset  . Stroke Mother   . Cancer Mother        Breast  . Osteoarthritis Mother   . Cancer Sister        breast cancer  . Myasthenia gravis Maternal Aunt   . Myocardial Infarction (Heart attack) Maternal Grandfather   . Cancer Father         Gastric Cancer 2005  . Polymyositis Maternal Aunt   . Lupus Cousin   . Rheum arthritis Cousin   . Lupus Cousin   . Cancer Sister   . Osteoarthritis Sister   . Cancer Sister        Breast Cancer 30 and 70  . Cancer Sister        Hydatidiform mole  . Colon cancer Neg Hx      SOCIAL HISTORY: Social History          Socioeconomic History  . Marital status: Divorced    Spouse name: Not on file  . Number of children: Not on file  . Years of education: Not on file  . Highest education level: Not on file  Occupational History  . Not on file  Social Needs  . Financial resource strain: Not on file  . Food insecurity:    Worry: Not on file    Inability: Not on file  . Transportation needs:    Medical: Not on file    Non-medical: Not on file  Tobacco Use  . Smoking status: Never Smoker  . Smokeless tobacco: Never Used  . 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 Alcohol use: No  . Drug use: No  . Sexual activity: Never  Other Topics Concern  . Not on file  Social History Narrative   Separated. Lives with 71 year old son. Was working until May 2014. Worked as Marine scientist, [employer]. Out on disability      PHYSICAL EXAM:    Vitals:   07/15/17 1037  BP: 130/82  Pulse: 99  Temp: 36.7 C (98 F)   Body mass index is 30.05 kg/m. Weight: 82.1 kg (181 lb)   GENERAL: Alert, active, oriented x3  NECK: Supple with no palpable mass and no adenopathy.  ABDOMEN: Soft and depressible, nontender with no palpable mass, no hepatomegaly. Wounds dry and clean.  EXTREMITIES: Well-developed well-nourished symmetrical with no dependent edema. On the right axilla there is a soft tissue mass. Measure 6 cm x 3 cm on the external part. Feels getting deep. Soft, mild tender. No erythema or secretions. Right knee scar well healed.   NEUROLOGICAL: Awake alert oriented, facial expression symmetrical, moving  all extremities.  REVIEW OF DATA: I have reviewed the following data today:  Infusion on 06/09/2017  Component Date Value  . Sodium 06/09/2017 139   . Potassium 06/09/2017 3.7   . Chloride 06/09/2017 102   . Carbon Dioxide (CO2) 06/09/2017 29   . Urea Nitrogen (BUN) 06/09/2017 10   . Creatinine 06/09/2017 0.6   . Glucose 06/09/2017 109   . Calcium 06/09/2017 9.4   . AST (Aspartate Aminotran* 06/09/2017 21   . ALT (Alanine Aminotransf* 06/09/2017 21   . Bilirubin, Total 06/09/2017 0.5   . Alk Phos (Alkaline Phosp* 06/09/2017 50   . Albumin 06/09/2017 3.7   . Protein, Total 06/09/2017 7.9*  . Anion Gap 06/09/2017 8   . BUN/CREA Ratio 06/09/2017 17   . Glomerular Filtration Ra* 06/09/2017 120   . WBC (White Blood Cell Co* 06/09/2017 7.9   . Hemoglobin 06/09/2017 12.2   . Hematocrit 06/09/2017 37.0   . Platelets 06/09/2017 199   . MCV (Mean Corpuscular Vo* 06/09/2017 92   . MCH (Mean Corpuscular He* 06/09/2017 30.4   . MCHC (Mean Corpuscular H* 06/09/2017 33.0   . RBC (Red Blood Cell Coun* 06/09/2017 4.01   . RDW-CV (Red Cell Distrib* 06/09/2017 13.6   . MPV (Mean Platelet Volum* 06/09/2017 12.8*  . Neutrophil Count 06/09/2017 6.5   . Neutrophil % 06/09/2017 82.7*  . Lymphocyte Count 06/09/2017 1.1   . Lymphocyte % 06/09/2017 13.7   . Monocyte Count 06/09/2017 0.3   . Monocyte % 06/09/2017 3.2   . Eosinophil Count 06/09/2017 0.00   . Eosinophil % 06/09/2017 0.0   . Basophil Count 06/09/2017 0.02   . Basophil % 06/09/2017 0.3   . Slide Review/Morphology 06/09/2017 Yes   . Immature Granulocyte Cou* 06/09/2017 0.01   . Immature Granulocyte % 06/09/2017 0.1   Infusion on 04/16/2017  Component Date Value  . Sodium 04/16/2017 141   . Potassium 04/16/2017 3.5   . Chloride 04/16/2017 103   . Carbon Dioxide (CO2) 04/16/2017 30   . Urea Nitrogen (BUN) 04/16/2017 9   . Creatinine 04/16/2017 0.7   . Glucose 04/16/2017 104   . Calcium 04/16/2017 9.1   . AST (Aspartate  Aminotran* 04/16/2017 23   . ALT (Alanine Aminotransf* 04/16/2017 22   . Bilirubin, Total 04/16/2017 0.9   . Alk Phos (Alkaline Phosp* 04/16/2017 55   . Albumin 04/16/2017 3.9   . Protein, Total 04/16/2017 7.5   . Anion Gap 04/16/2017 8   . BUN/CREA Ratio 04/16/2017 13   . Glomerular Filtration Ra* 04/16/2017 >60   . WBC (White Blood Cell Co* 04/16/2017 6.0   . Hemoglobin 04/16/2017 12.1   . Hematocrit 04/16/2017 36.5   . Platelets 04/16/2017 193   . MCV (Mean Corpuscular Vo* 04/16/2017 92   . MCH (Mean Corpuscular He* 04/16/2017 30.3   . MCHC (Mean Corpuscular H* 04/16/2017 33.2   . RBC (Red Blood Cell Coun* 04/16/2017 3.99   . RDW-CV (Red Cell Distrib* 04/16/2017 13.9   . MPV (Mean Platelet Volum* 04/16/2017 13.3*  . Neutrophil Count 04/16/2017 4.1   . Neutrophil % 04/16/2017 68.7   . Lymphocyte Count 04/16/2017 1.4   . Lymphocyte % 04/16/2017 23.1   . Monocyte Count 04/16/2017 0.5   . Monocyte % 04/16/2017 7.6   . Eosinophil Count 04/16/2017 0.02   . Eosinophil % 04/16/2017 0.3   . Basophil Count 04/16/2017 0.02   . Basophil % 04/16/2017 0.3   . Slide Review/Morphology 04/16/2017 Yes   . Immature Granulocyte Cou* 04/16/2017 0.00   . Immature Granulocyte % 04/16/2017  0.0      ASSESSMENT: [name] is a 57 y.o. female presenting for consultation for right axillary soft tissue mass. The patient has a mass on the right that is causing some pain and discomfort on pressure and bleeding when shaving. Patient oriented about the diagnosis of the soft tissue mass that most likely is benign but differential is lipoma vs ectopic breast tissue. The second is mostly likely of the way it feels without the discrete borders of the lipoma. This mass is softer like breast tissue. Pathology will confirm. Patient oriented about the procedure, benefits and risk.   After a long discussion with the patient considering the alternatives to do the procedure in the office or in the OR it was  decided to be done in the OR due to multiple reasons. This is a more complex patient due to her adrenal insufficiency syndrome and autoimmune condition required Remicade infusions and chronic steroid use. Patient is a high risk of adrenal insufficiency from any minor stress. She refers that she even need a stress dose if she is going to do more exercise than usual. Rheumatologist will be consulted for recommendations regarding the pre operative medication management.   PLAN: 1. Excision of right axillary soft tissue mass (19120) 2. Rheumatologist clearance and recommendations regarding peri procedural medications management.  3. Stop aspirin 5 days before surgery 4. CBC, CMP done on 06/09/17.  5. Contact us if has any question or concern.   Patient and/or representative verbalized understanding, all questions were answered, and were agreeable with the plan outlined above.   Herbert Pun, MD  Electronically signed by Herbert Pun, MD

## 2017-08-11 NOTE — H&P (View-Only) (Signed)
PATIENT PROFILE: [name] is a 57 y.o. female who presents to the Clinic for consultation at the request of Dr. Edwina Barth for evaluation of right axillary soft tissue.  PCP:  Velna Ochs, MD  HISTORY OF PRESENT ILLNESS: [name] reports she has felt that soft tissue mass on her right axilla since ~8 years ago. The patient refers it was small at the beginning but it has been getting bigger recently. It is giving her problems with shaving causing bleeding. It got swollen recently after knee surgery were she needed crutches. No pain radiation. Minimal pain aggravated when pressure is applied. Pain improves spontaneously. Uncomfortable these days with the heat. Denies secretions or previous infection.    PROBLEM LIST:         Problem List  Date Reviewed: 06/29/2017         Noted   Inflammatory arthritis 01/20/2017   High risk medications (not anticoagulants) long-term use 01/20/2017   Adrenal insufficiency (CMS-HCC) 01/20/2017   H/O orthostatic hypotension 12/22/2016   Renal cyst, left 12/22/2016   Gluten intolerance 12/22/2016   Combined hyperlipidemia 02/06/2016   Medication management 08/29/2015   Steroid-induced osteoporosis 05/16/2015   Foot drop, left 05/16/2015   History of colon polyps 06/26/2013   Refusal of blood transfusions as patient is Jehovah's Witness 03/06/2013   Sickle cell trait (CMS-HCC) 03/06/2013   Behcet's disease (CMS-HCC) (Chronic) 10/03/2012   GERD (gastroesophageal reflux disease) (Chronic) 10/03/2012   Long-term use of immunosuppressant medication (Chronic) 10/03/2012   Diarrhea, unspecified 10/03/2012   Uveitis 10/03/2012      GENERAL REVIEW OF SYSTEMS:   General ROS: negative for - chills, fever, weight gain or weight loss. Positive for fatigue and night sweats.  Allergy and Immunology ROS: negative for - hives  Hematological and Lymphatic ROS: positive for - bleeding problems or bruising, negative for palpable  nodes Endocrine ROS: negative for - heat or cold intolerance, hair changes Respiratory ROS: positive for - cough, shortness of breath or wheezing Cardiovascular ROS: no chest pain. Positive for palpitations GI ROS: negative for nausea, vomiting, abdominal pain, diarrhea, constipation Musculoskeletal ROS: Positive for - joint swelling or muscle pain Neurological ROS: negative for - confusion, syncope. Positive for headaches.  Dermatological ROS: negative for pruritus and rash. Positive for axillary soft tissue mass.  Psychiatric: negative for anxiety, depression, difficulty sleeping and memory loss  MEDICATIONS: CurrentMedications        Current Outpatient Medications  Medication Sig Dispense Refill  . acetaminophen (TYLENOL EXTRA STRENGTH) 500 MG tablet Tylenol Extra Strength 500 mg tablet   1 tablet 3 times a day by oral route.    Marland Kitchen albuterol (VENTOLIN HFA) 90 mcg/actuation inhaler Inhale 1 inhalation into the lungs every 4 (four) hours as needed for Wheezing 1 Inhaler 6  . allantoin-onion-pegs-water (SCAR GEL) Gel Apply topically 2 (two) times daily Scar Away 100% silcone Gel    . aluminum-magnesium hydroxide-simethicone, DRAH, (MAALOX ADVANCED) 200-200-20 mg/5 mL suspension Maalox Advanced 200 mg-200 mg-20 mg/5 mL oral suspension   10 mL every day by oral route.    Marland Kitchen amoxicillin (AMOXIL) 500 MG capsule Take 500 mg by mouth every 6 (six) hours    . aspirin 325 MG tablet Take 325 mg by mouth 2 (two) times daily    . CALCIUM-MAGNESIUM-ZINC ORAL Take 1 tablet by mouth 3 (three) times a day    . cholecalciferol (VITAMIN D3) 1,000 unit capsule Take 1,000 Units by mouth once daily.    . colchicine (COLCRYS-BRAND) 0.6 mg  tablet TAKE 1 TABLET BY MOUTH 2 TIMES DAILY. 60 tablet 2  . diphenhydrAMINE (BENADRYL) 25 mg capsule Take 25 mg by mouth nightly      . DULoxetine (CYMBALTA) 60 MG DR capsule Take 1 capsule (60 mg total) by mouth once daily 90 capsule 3  . fludrocortisone  (FLORINEF) 0.1 mg tablet Take 1 tablet (0.1 mg total) by mouth once daily 90 tablet 3  . fluocinonide (LIDEX) 0.05 % cream   2  . fluticasone propionate (FLONASE) 50 mcg/actuation nasal spray Place 1 spray into both nostrils once daily 16 g 6  . folic acid (FOLVITE) 1 MG tablet Take 2 tablets (2 mg total) by mouth once daily 180 tablet 3  . ibuprofen (ADVIL,MOTRIN) 200 MG tablet Take 200 mg by mouth nightly.    . inFLIXimab (REMICADE) 100 mg injection Inject into the vein. Every 8 weeks     . lansoprazole (PREVACID) 30 MG DR capsule Take 1 capsule (30 mg total) by mouth once daily 90 capsule 1  . metoprolol succinate (TOPROL-XL) 50 MG XL tablet Take 50 mg by mouth once daily.    . pen needle, diabetic (UNIFINE PENTIPS) 31 gauge x 3/16" needle Unifine Pentips 31 gauge x 3/16" needle   1 needle every day by miscell. route.    . predniSONE (DELTASONE) 5 MG tablet Take 2 tablets (10 mg total) by mouth once daily 180 tablet 3  . SOD.CHLORID/POTASSIUM CHLORIDE (THERMOTABS ORAL) Take 2 tablets by mouth once as needed      . sodium chloride-aloe vera (AYR SALINE) Gel gel Apply topically as needed.    . teriparatide (FORTEO) 20 mcg/dose - 600 mcg/2.4 mL pen injector Inject 20 mcg subcutaneously once daily    . UNABLE TO FIND Apply 2 drops to eye as needed Med Name: crocodile tears, Arthifical Tears     . UNABLE TO FIND Apply to eye. Med Name: Soothe      No current facility-administered medications for this visit.       ALLERGIES: Fentanyl; Simponi aria [golimumab]; Sulfites; Celebrex [celecoxib]; Epinephrine; Erythromycin; Latex; Loratadine; Morphine; and Sulfa (sulfonamide antibiotics)  PAST MEDICAL HISTORY:     Past Medical History:  Diagnosis Date  . Abdominal bloating 10/03/2012  . Allergic state   . Anemia, unspecified    frequent prior to hysterectomy  . Anxiety    medical issues and life changes  . Asthma without status asthmaticus, unspecified 1986   . Behcet's disease (CMS-HCC)   . Endometriosis   . GERD (gastroesophageal reflux disease)   . Heart murmur, unspecified    when younger  . Osteoarthritis   . Osteoporosis   . Renal cyst, left 12/22/2016  . S/P spinal fusion 12/30/2012  . Sinusitis, unspecified   . Steroid-induced hyperglycemia, unspecified   . Torn meniscus 12/12/2012   lateral and medial meniscus tear; right knee; surgical fixation  . Ulcer    with Behcets flares  . Uveitis     PAST SURGICAL HISTORY:      Past Surgical History:  Procedure Laterality Date  . breast surgery     cyst resection with nipple repair  . BREAST SURGERY  1986   3 benign cyst right breast  . CESAREAN SECTION    . CHOLECYSTECTOMY    . COLONOSCOPY W/BIOPSY N/A 03/07/2013   Procedure: Colonoscopy at [facility] with Moderate Sedation;  Surgeon: Trixie Deis, MD;  Location: [facility];  Service: Gastroenterology;  Laterality: N/A;  . COLONOSCOPY W/REMOVAL LESIONS BY SNARE  03/07/2013   Procedure: COLONOSCOPY, FLEXIBLE; WITH REMOVAL OF TUMOR(S), POLYP(S), OR OTHER LESION(S) BY SNARE TECHNIQUE;  Surgeon: Trixie Deis, MD;  Location: [facility];  Service: Gastroenterology;;  . diskectomy     L5-S1 left side.  Marland Kitchen EXCISION THYROGLOSSAL DUCT CYST/SINUS    . fusion of SI joint  03/05/2016  . HYSTERECTOMY    . left knee surgery     ligament repair for chondromalacia patella  . SPINE SURGERY  8/12 & 12/14   hemilaminectomy/spinal fusion L5-S1  . submandibular mass     path: benign inflammatory mass  . tendon surgery     Dequervain's bilaterally     FAMILY HISTORY:      Family History  Problem Relation Age of Onset  . Stroke Mother   . Cancer Mother        Breast  . Osteoarthritis Mother   . Cancer Sister        breast cancer  . Myasthenia gravis Maternal Aunt   . Myocardial Infarction (Heart attack) Maternal Grandfather   . Cancer Father         Gastric Cancer 2005  . Polymyositis Maternal Aunt   . Lupus Cousin   . Rheum arthritis Cousin   . Lupus Cousin   . Cancer Sister   . Osteoarthritis Sister   . Cancer Sister        Breast Cancer 30 and 70  . Cancer Sister        Hydatidiform mole  . Colon cancer Neg Hx      SOCIAL HISTORY: Social History          Socioeconomic History  . Marital status: Divorced    Spouse name: Not on file  . Number of children: Not on file  . Years of education: Not on file  . Highest education level: Not on file  Occupational History  . Not on file  Social Needs  . Financial resource strain: Not on file  . Food insecurity:    Worry: Not on file    Inability: Not on file  . Transportation needs:    Medical: Not on file    Non-medical: Not on file  Tobacco Use  . Smoking status: Never Smoker  . Smokeless tobacco: Never Used  . 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 Alcohol use: No  . Drug use: No  . Sexual activity: Never  Other Topics Concern  . Not on file  Social History Narrative   Separated. Lives with 71 year old son. Was working until May 2014. Worked as Marine scientist, [employer]. Out on disability      PHYSICAL EXAM:    Vitals:   07/15/17 1037  BP: 130/82  Pulse: 99  Temp: 36.7 C (98 F)   Body mass index is 30.05 kg/m. Weight: 82.1 kg (181 lb)   GENERAL: Alert, active, oriented x3  NECK: Supple with no palpable mass and no adenopathy.  ABDOMEN: Soft and depressible, nontender with no palpable mass, no hepatomegaly. Wounds dry and clean.  EXTREMITIES: Well-developed well-nourished symmetrical with no dependent edema. On the right axilla there is a soft tissue mass. Measure 6 cm x 3 cm on the external part. Feels getting deep. Soft, mild tender. No erythema or secretions. Right knee scar well healed.   NEUROLOGICAL: Awake alert oriented, facial expression symmetrical, moving  all extremities.  REVIEW OF DATA: I have reviewed the following data today:  Infusion on 06/09/2017  Component Date Value  . Sodium 06/09/2017 139   . Potassium 06/09/2017 3.7   . Chloride 06/09/2017 102   . Carbon Dioxide (CO2) 06/09/2017 29   . Urea Nitrogen (BUN) 06/09/2017 10   . Creatinine 06/09/2017 0.6   . Glucose 06/09/2017 109   . Calcium 06/09/2017 9.4   . AST (Aspartate Aminotran* 06/09/2017 21   . ALT (Alanine Aminotransf* 06/09/2017 21   . Bilirubin, Total 06/09/2017 0.5   . Alk Phos (Alkaline Phosp* 06/09/2017 50   . Albumin 06/09/2017 3.7   . Protein, Total 06/09/2017 7.9*  . Anion Gap 06/09/2017 8   . BUN/CREA Ratio 06/09/2017 17   . Glomerular Filtration Ra* 06/09/2017 120   . WBC (White Blood Cell Co* 06/09/2017 7.9   . Hemoglobin 06/09/2017 12.2   . Hematocrit 06/09/2017 37.0   . Platelets 06/09/2017 199   . MCV (Mean Corpuscular Vo* 06/09/2017 92   . MCH (Mean Corpuscular He* 06/09/2017 30.4   . MCHC (Mean Corpuscular H* 06/09/2017 33.0   . RBC (Red Blood Cell Coun* 06/09/2017 4.01   . RDW-CV (Red Cell Distrib* 06/09/2017 13.6   . MPV (Mean Platelet Volum* 06/09/2017 12.8*  . Neutrophil Count 06/09/2017 6.5   . Neutrophil % 06/09/2017 82.7*  . Lymphocyte Count 06/09/2017 1.1   . Lymphocyte % 06/09/2017 13.7   . Monocyte Count 06/09/2017 0.3   . Monocyte % 06/09/2017 3.2   . Eosinophil Count 06/09/2017 0.00   . Eosinophil % 06/09/2017 0.0   . Basophil Count 06/09/2017 0.02   . Basophil % 06/09/2017 0.3   . Slide Review/Morphology 06/09/2017 Yes   . Immature Granulocyte Cou* 06/09/2017 0.01   . Immature Granulocyte % 06/09/2017 0.1   Infusion on 04/16/2017  Component Date Value  . Sodium 04/16/2017 141   . Potassium 04/16/2017 3.5   . Chloride 04/16/2017 103   . Carbon Dioxide (CO2) 04/16/2017 30   . Urea Nitrogen (BUN) 04/16/2017 9   . Creatinine 04/16/2017 0.7   . Glucose 04/16/2017 104   . Calcium 04/16/2017 9.1   . AST (Aspartate  Aminotran* 04/16/2017 23   . ALT (Alanine Aminotransf* 04/16/2017 22   . Bilirubin, Total 04/16/2017 0.9   . Alk Phos (Alkaline Phosp* 04/16/2017 55   . Albumin 04/16/2017 3.9   . Protein, Total 04/16/2017 7.5   . Anion Gap 04/16/2017 8   . BUN/CREA Ratio 04/16/2017 13   . Glomerular Filtration Ra* 04/16/2017 >60   . WBC (White Blood Cell Co* 04/16/2017 6.0   . Hemoglobin 04/16/2017 12.1   . Hematocrit 04/16/2017 36.5   . Platelets 04/16/2017 193   . MCV (Mean Corpuscular Vo* 04/16/2017 92   . MCH (Mean Corpuscular He* 04/16/2017 30.3   . MCHC (Mean Corpuscular H* 04/16/2017 33.2   . RBC (Red Blood Cell Coun* 04/16/2017 3.99   . RDW-CV (Red Cell Distrib* 04/16/2017 13.9   . MPV (Mean Platelet Volum* 04/16/2017 13.3*  . Neutrophil Count 04/16/2017 4.1   . Neutrophil % 04/16/2017 68.7   . Lymphocyte Count 04/16/2017 1.4   . Lymphocyte % 04/16/2017 23.1   . Monocyte Count 04/16/2017 0.5   . Monocyte % 04/16/2017 7.6   . Eosinophil Count 04/16/2017 0.02   . Eosinophil % 04/16/2017 0.3   . Basophil Count 04/16/2017 0.02   . Basophil % 04/16/2017 0.3   . Slide Review/Morphology 04/16/2017 Yes   . Immature Granulocyte Cou* 04/16/2017 0.00   . Immature Granulocyte % 04/16/2017  0.0      ASSESSMENT: [name] is a 57 y.o. female presenting for consultation for right axillary soft tissue mass. The patient has a mass on the right that is causing some pain and discomfort on pressure and bleeding when shaving. Patient oriented about the diagnosis of the soft tissue mass that most likely is benign but differential is lipoma vs ectopic breast tissue. The second is mostly likely of the way it feels without the discrete borders of the lipoma. This mass is softer like breast tissue. Pathology will confirm. Patient oriented about the procedure, benefits and risk.   After a long discussion with the patient considering the alternatives to do the procedure in the office or in the OR it was  decided to be done in the OR due to multiple reasons. This is a more complex patient due to her adrenal insufficiency syndrome and autoimmune condition required Remicade infusions and chronic steroid use. Patient is a high risk of adrenal insufficiency from any minor stress. She refers that she even need a stress dose if she is going to do more exercise than usual. Rheumatologist will be consulted for recommendations regarding the pre operative medication management.   PLAN: 1. Excision of right axillary soft tissue mass (19120) 2. Rheumatologist clearance and recommendations regarding peri procedural medications management.  3. Stop aspirin 5 days before surgery 4. CBC, CMP done on 06/09/17.  5. Contact us if has any question or concern.   Patient and/or representative verbalized understanding, all questions were answered, and were agreeable with the plan outlined above.   Herbert Pun, MD  Electronically signed by Herbert Pun, MD

## 2017-08-12 ENCOUNTER — Other Ambulatory Visit: Payer: Self-pay

## 2017-08-12 ENCOUNTER — Encounter
Admission: RE | Admit: 2017-08-12 | Discharge: 2017-08-12 | Disposition: A | Discharge status: Home / Self Care | Payer: Medicare Other | Source: Ambulatory Visit | Attending: General Surgery | Admitting: General Surgery

## 2017-08-12 DIAGNOSIS — Z01810 Encounter for preprocedural cardiovascular examination: Secondary | ICD-10-CM | POA: Diagnosis present

## 2017-08-12 HISTORY — DX: Acute myocardial infarction, unspecified: I21.9

## 2017-08-12 HISTORY — DX: Unspecified osteoarthritis, unspecified site: M19.90

## 2017-08-12 HISTORY — DX: Cerebral infarction, unspecified: I63.9

## 2017-08-12 HISTORY — DX: Foot drop, unspecified foot: M21.379

## 2017-08-12 HISTORY — DX: Unspecified visual disturbance: H53.9

## 2017-08-12 HISTORY — DX: Depression, unspecified: F32.A

## 2017-08-12 HISTORY — DX: Cyst of kidney, acquired: N28.1

## 2017-08-12 HISTORY — DX: Abscess, furuncle and carbuncle of nose: J34.0

## 2017-08-12 HISTORY — DX: Phlebitis and thrombophlebitis of superficial vessels of lower extremities, bilateral: I80.03

## 2017-08-12 HISTORY — DX: Sickle-cell trait: D57.3

## 2017-08-12 HISTORY — DX: Major depressive disorder, single episode, unspecified: F32.9

## 2017-08-12 HISTORY — DX: Anxiety disorder, unspecified: F41.9

## 2017-08-12 HISTORY — DX: Cardiac arrhythmia, unspecified: I49.9

## 2017-08-12 MED FILL — METHOTREXATE 25 MG/ML VIAL: 50 | 28 days supply | Qty: 4 | Fill #4

## 2017-08-12 NOTE — Patient Instructions (Addendum)
Your procedure is scheduled on: Wednesday, AUGUST 14,2019  Report to THE SECOND FLOOR OF THE MEDICAL MALL.  DO NOT STOP ON THE FIRST FLOOR TO REGISTER.    To find out your arrival time please call [phone] between 1PM - 3PM on Tuesday, August 17, 2017  Remember: Instructions that are not followed completely may result in serious medical risk,  up to and including death, or upon the discretion of your surgeon and anesthesiologist your  surgery may need to be rescheduled.     _X__ 1. Do not eat food after midnight the night before your procedure.                 No gum chewing or hard candies. ABSOLUTELY NOTHING SOLID IN YOUR MOUTH AFTER MIDNIGHT                   You may drink clear liquids up to 2 hours before you are scheduled to arrive for your surgery-                   DO not drink clear liquids within 2 hours of the start of your surgery.                  Clear Liquids include:  water, apple juice without pulp, clear carbohydrate                 drink such as Clearfast of Gatorade, Black Coffee or Tea (Do not add                 anything to coffee or tea).NO DAIRY PRODUCTS / SOUPS OR BROTH  __X__2.  On the morning of surgery brush your teeth with toothpaste and water,                   You may rinse your mouth with mouthwash if you wish.                    Do not swallow any toothpaste of mouthwash.     _X__ 3.  No Alcohol for 24 hours before or after surgery.   _X__ 4.  Do Not Smoke or use e-cigarettes For 24 Hours Prior to Your Surgery.                 Do not use any chewable tobacco products for at least 6 hours prior to                 surgery.  ____  5.  Bring all medications with you on the day of surgery if instructed.   __X_  6.  Notify your doctor if there is any change in your medical condition      (cold, fever, infections).     Do not wear jewelry, make-up, hairpins, clips or nail polish. Do not wear lotions, powders, or perfumes.  You may NOT wear deodorant. Do not shave 48 hours prior to surgery. Men may shave face and neck. Do not bring valuables to the hospital.    [facility] is not responsible for any belongings or valuables.  Contacts, dentures or bridgework may not be worn into surgery. Leave your suitcase in the car. After surgery it may be brought to your room. For patients admitted to the hospital, discharge time is determined by your treatment team.   Patients discharged the day of surgery will not be allowed to drive home.   Please read over  the following fact sheets that you were given:   PREPARING FOR SURGERY    ____ Take these medicines the morning of surgery with A SIP OF WATER:    1. TYLENOL  2.  COLCRYS  3.  CYMBALTA  4.  PREDNISONE  5.  FLORINEF  6.  METOPROLOL             7.  PREVACID             8.  FLONASE             9.  ALBUTEROL  ____ Fleet Enema (as directed)   _X___ Use CHG Soap as directed  _X___ Use inhalers on the day of surgery.Marland KitchenPLEASE BRING INHALER WITH YOU  __X__ Stop ASPIRIN AS OF 08/13/2017 (PER MD)  _X_ Stop Anti-inflammatories AS OF 08/13/2017            THIS INCLUDES IBUPROFEN / MOTRIN / ADVIL / ALEVE   __X__ Stop supplements until after surgery.               THIS INCLUDES CALCIUM - MAGNESIUM - ZINC  ____ Bring C-Pap to the hospital.   CONTINUE TAKING FOLIC ACID BUT DO NOT TAKE ON THE MORNING OF SURGERY  YOU MAY CONTINUE USING TEARS AND SCAR GEL ALONG WITH NASAL CREAMS.      DO NOT USE THE SCAR GEL ON THE DAY OF SURGERY  PLEASE BRING "BLOOD CARD" WITH YOU ON DAY OF SURGERY  YOU MAY CONTINUE TO TAKE YOUR SALT SUBSTITUTE AS USUAL

## 2017-08-17 MED ORDER — CEFAZOLIN SODIUM-DEXTROSE 2-4 GM/100ML-% IV SOLN
2.0000 g | INTRAVENOUS | Status: AC
  Administered 2017-08-18: 2 g via INTRAVENOUS

## 2017-08-18 ENCOUNTER — Other Ambulatory Visit: Payer: Self-pay

## 2017-08-18 ENCOUNTER — Ambulatory Visit: Payer: Medicare Other | Admitting: Anesthesiology

## 2017-08-18 ENCOUNTER — Encounter
Admission: RE | Disposition: A | Discharge status: Home / Self Care | Payer: Self-pay | Source: Ambulatory Visit | Attending: General Surgery

## 2017-08-18 ENCOUNTER — Ambulatory Visit
Admission: RE | Admit: 2017-08-18 | Discharge: 2017-08-18 | Disposition: A | Discharge status: Home / Self Care | Payer: Medicare Other | Source: Ambulatory Visit | Attending: General Surgery | Admitting: General Surgery

## 2017-08-18 DIAGNOSIS — Z7952 Long term (current) use of systemic steroids: Secondary | ICD-10-CM | POA: Diagnosis not present

## 2017-08-18 DIAGNOSIS — Z79899 Other long term (current) drug therapy: Secondary | ICD-10-CM | POA: Diagnosis not present

## 2017-08-18 DIAGNOSIS — Z885 Allergy status to narcotic agent status: Secondary | ICD-10-CM | POA: Insufficient documentation

## 2017-08-18 DIAGNOSIS — M818 Other osteoporosis without current pathological fracture: Secondary | ICD-10-CM | POA: Diagnosis not present

## 2017-08-18 DIAGNOSIS — Z800 Family history of malignant neoplasm of digestive organs: Secondary | ICD-10-CM | POA: Diagnosis not present

## 2017-08-18 DIAGNOSIS — Z803 Family history of malignant neoplasm of breast: Secondary | ICD-10-CM | POA: Diagnosis not present

## 2017-08-18 DIAGNOSIS — Z888 Allergy status to other drugs, medicaments and biological substances status: Secondary | ICD-10-CM | POA: Insufficient documentation

## 2017-08-18 DIAGNOSIS — Z7951 Long term (current) use of inhaled steroids: Secondary | ICD-10-CM | POA: Insufficient documentation

## 2017-08-18 DIAGNOSIS — M1380 Other specified arthritis, unspecified site: Secondary | ICD-10-CM | POA: Insufficient documentation

## 2017-08-18 DIAGNOSIS — Z91040 Latex allergy status: Secondary | ICD-10-CM | POA: Diagnosis not present

## 2017-08-18 DIAGNOSIS — E2740 Unspecified adrenocortical insufficiency: Secondary | ICD-10-CM | POA: Insufficient documentation

## 2017-08-18 DIAGNOSIS — Q831 Accessory breast: Secondary | ICD-10-CM | POA: Diagnosis not present

## 2017-08-18 DIAGNOSIS — Z823 Family history of stroke: Secondary | ICD-10-CM | POA: Diagnosis not present

## 2017-08-18 DIAGNOSIS — Z8249 Family history of ischemic heart disease and other diseases of the circulatory system: Secondary | ICD-10-CM | POA: Insufficient documentation

## 2017-08-18 DIAGNOSIS — K219 Gastro-esophageal reflux disease without esophagitis: Secondary | ICD-10-CM | POA: Insufficient documentation

## 2017-08-18 DIAGNOSIS — M352 Behcet's disease: Secondary | ICD-10-CM | POA: Insufficient documentation

## 2017-08-18 DIAGNOSIS — Z86010 Personal history of colonic polyps: Secondary | ICD-10-CM | POA: Diagnosis not present

## 2017-08-18 DIAGNOSIS — Z882 Allergy status to sulfonamides status: Secondary | ICD-10-CM | POA: Diagnosis not present

## 2017-08-18 DIAGNOSIS — R222 Localized swelling, mass and lump, trunk: Secondary | ICD-10-CM | POA: Diagnosis present

## 2017-08-18 DIAGNOSIS — D573 Sickle-cell trait: Secondary | ICD-10-CM | POA: Diagnosis not present

## 2017-08-18 HISTORY — PX: EXCISION OF BREAST LESION: SHX6676

## 2017-08-18 SURGERY — EXCISION OF BREAST LESION
Anesthesia: General | Site: Axilla | Laterality: Right | Wound class: Clean

## 2017-08-18 MED ORDER — GLYCOPYRROLATE 0.2 MG/ML IJ SOLN
INTRAMUSCULAR | Status: AC
  Filled 2017-08-18: qty 1

## 2017-08-18 MED ORDER — DEXTROSE-NACL 5-0.45 % IV SOLN
INTRAVENOUS | Status: DC
  Administered 2017-08-18: 09:00:00 via INTRAVENOUS

## 2017-08-18 MED ORDER — ONDANSETRON HCL 4 MG/2ML IJ SOLN
INTRAMUSCULAR | Status: DC | PRN
  Administered 2017-08-18: 4 mg via INTRAVENOUS

## 2017-08-18 MED ORDER — CEFAZOLIN SODIUM-DEXTROSE 2-4 GM/100ML-% IV SOLN
INTRAVENOUS | Status: AC
  Filled 2017-08-18: qty 100

## 2017-08-18 MED ORDER — ONDANSETRON HCL 4 MG/2ML IJ SOLN
INTRAMUSCULAR | Status: AC
  Filled 2017-08-18: qty 2

## 2017-08-18 MED ORDER — LIDOCAINE HCL (CARDIAC) PF 100 MG/5ML IV SOSY
PREFILLED_SYRINGE | INTRAVENOUS | Status: DC | PRN
  Administered 2017-08-18: 60 mg via INTRAVENOUS

## 2017-08-18 MED ORDER — LIDOCAINE HCL (PF) 2 % IJ SOLN
INTRAMUSCULAR | Status: AC
  Filled 2017-08-18: qty 10

## 2017-08-18 MED ORDER — BUPIVACAINE-EPINEPHRINE (PF) 0.5% -1:200000 IJ SOLN
INTRAMUSCULAR | Status: DC | PRN
  Administered 2017-08-18: 22 mL

## 2017-08-18 MED ORDER — HYDROMORPHONE HCL 1 MG/ML IJ SOLN
INTRAMUSCULAR | Status: DC | PRN
  Administered 2017-08-18 (×2): 0.5 mg via INTRAVENOUS

## 2017-08-18 MED ORDER — METHYLPREDNISOLONE SODIUM SUCC 125 MG IJ SOLR
INTRAMUSCULAR | Status: AC
  Filled 2017-08-18: qty 2

## 2017-08-18 MED ORDER — DEXAMETHASONE SODIUM PHOSPHATE 10 MG/ML IJ SOLN
INTRAMUSCULAR | Status: AC
  Filled 2017-08-18: qty 1

## 2017-08-18 MED ORDER — ACETAMINOPHEN 10 MG/ML IV SOLN
INTRAVENOUS | Status: DC | PRN
  Administered 2017-08-18: 1000 mg via INTRAVENOUS

## 2017-08-18 MED ORDER — HYDROCODONE-ACETAMINOPHEN 5-325 MG PO TABS: 1.0000 | ORAL_TABLET | ORAL | 0 refills | Status: AC | PRN

## 2017-08-18 MED ORDER — MIDAZOLAM HCL 2 MG/2ML IJ SOLN
INTRAMUSCULAR | Status: AC
  Filled 2017-08-18: qty 4

## 2017-08-18 MED ORDER — SCOPOLAMINE 1 MG/3DAYS TD PT72
1.0000 | MEDICATED_PATCH | Freq: Once | TRANSDERMAL | Status: DC
  Administered 2017-08-18: 1.5 mg via TRANSDERMAL

## 2017-08-18 MED ORDER — MIDAZOLAM HCL 2 MG/2ML IJ SOLN
INTRAMUSCULAR | Status: DC | PRN
  Administered 2017-08-18: 4 mg via INTRAVENOUS

## 2017-08-18 MED ORDER — MEPERIDINE HCL 50 MG/ML IJ SOLN: 6.2500 mg | INTRAMUSCULAR | Status: DC | PRN

## 2017-08-18 MED ORDER — SCOPOLAMINE 1 MG/3DAYS TD PT72
MEDICATED_PATCH | TRANSDERMAL | Status: AC
  Filled 2017-08-18: qty 1

## 2017-08-18 MED ORDER — PROPOFOL 10 MG/ML IV BOLUS
INTRAVENOUS | Status: DC | PRN
  Administered 2017-08-18: 50 mg via INTRAVENOUS
  Administered 2017-08-18: 150 mg via INTRAVENOUS

## 2017-08-18 MED ORDER — DEXAMETHASONE SODIUM PHOSPHATE 10 MG/ML IJ SOLN
INTRAMUSCULAR | Status: DC | PRN
  Administered 2017-08-18: 10 mg via INTRAVENOUS

## 2017-08-18 MED ORDER — ACETAMINOPHEN 325 MG PO TABS: 325.0000 mg | ORAL_TABLET | ORAL | Status: DC | PRN

## 2017-08-18 MED ORDER — FAMOTIDINE 20 MG PO TABS
ORAL_TABLET | ORAL | Status: AC
  Filled 2017-08-18: qty 1

## 2017-08-18 MED ORDER — BUPIVACAINE-EPINEPHRINE (PF) 0.5% -1:200000 IJ SOLN
INTRAMUSCULAR | Status: AC
  Filled 2017-08-18: qty 30

## 2017-08-18 MED ORDER — PROPOFOL 10 MG/ML IV BOLUS
INTRAVENOUS | Status: AC
  Filled 2017-08-18: qty 20

## 2017-08-18 MED ORDER — GLYCOPYRROLATE 0.2 MG/ML IJ SOLN
INTRAMUSCULAR | Status: DC | PRN
  Administered 2017-08-18: 0.1 mg via INTRAVENOUS

## 2017-08-18 MED ORDER — HYDROMORPHONE HCL 1 MG/ML IJ SOLN: 0.2500 mg | INTRAMUSCULAR | Status: DC | PRN

## 2017-08-18 MED ORDER — PHENYLEPHRINE HCL 10 MG/ML IJ SOLN
INTRAMUSCULAR | Status: DC | PRN
  Administered 2017-08-18 (×3): 100 ug via INTRAVENOUS
  Administered 2017-08-18 (×2): 200 ug via INTRAVENOUS
  Administered 2017-08-18: 100 ug via INTRAVENOUS

## 2017-08-18 MED ORDER — METHYLPREDNISOLONE SODIUM SUCC 125 MG IJ SOLR
INTRAMUSCULAR | Status: DC | PRN
  Administered 2017-08-18: 125 mg via INTRAVENOUS

## 2017-08-18 MED ORDER — ACETAMINOPHEN 160 MG/5ML PO SOLN
325.0000 mg | ORAL | Status: DC | PRN
  Filled 2017-08-18: qty 20.3

## 2017-08-18 MED ORDER — ONDANSETRON HCL 4 MG/2ML IJ SOLN: 4.0000 mg | Freq: Once | INTRAMUSCULAR | Status: DC | PRN

## 2017-08-18 MED ORDER — HYDROMORPHONE HCL 1 MG/ML IJ SOLN
INTRAMUSCULAR | Status: AC
  Filled 2017-08-18: qty 1

## 2017-08-18 MED FILL — HYDROCODON-APAP 5-325: 5-325 | 3 days supply | Qty: 10 | Fill #0

## 2017-08-18 SURGICAL SUPPLY — 36 items
ADH SKN CLS APL DERMABOND .7 (GAUZE/BANDAGES/DRESSINGS) ×1
APL SKNCLS STERI-STRIP NONHPOA (GAUZE/BANDAGES/DRESSINGS) ×1
BENZOIN TINCTURE PRP APPL 2/3 (GAUZE/BANDAGES/DRESSINGS) ×1 IMPLANT
CANISTER SUCT 1200ML W/VALVE (MISCELLANEOUS) ×2 IMPLANT
CHLORAPREP W/TINT 26ML (MISCELLANEOUS) ×2 IMPLANT
CNTNR SPEC 2.5X3XGRAD LEK (MISCELLANEOUS) ×1
CONT SPEC 4OZ STER OR WHT (MISCELLANEOUS) ×1
CONT SPEC 4OZ STRL OR WHT (MISCELLANEOUS) ×1
CONTAINER SPEC 2.5X3XGRAD LEK (MISCELLANEOUS) ×1 IMPLANT
DERMABOND ADVANCED (GAUZE/BANDAGES/DRESSINGS) ×1
DERMABOND ADVANCED .7 DNX12 (GAUZE/BANDAGES/DRESSINGS) ×1 IMPLANT
DEVICE DUBIN SPECIMEN MAMMOGRA (MISCELLANEOUS) ×1 IMPLANT
DRAPE LAPAROTOMY 77X122 PED (DRAPES) ×2 IMPLANT
DRSG TELFA 4X3 1S NADH ST (GAUZE/BANDAGES/DRESSINGS) ×1 IMPLANT
ELECT REM PT RETURN 9FT ADLT (ELECTROSURGICAL) ×2
ELECTRODE REM PT RTRN 9FT ADLT (ELECTROSURGICAL) ×1 IMPLANT
GLOVE BIO SURGEON STRL SZ 6.5 (GLOVE) ×2 IMPLANT
GOWN STRL REUS W/ TWL LRG LVL3 (GOWN DISPOSABLE) ×3 IMPLANT
GOWN STRL REUS W/TWL LRG LVL3 (GOWN DISPOSABLE) ×6
KIT TURNOVER KIT A (KITS) ×2 IMPLANT
LABEL OR SOLS (LABEL) ×2 IMPLANT
MARGIN MAP 10MM (MISCELLANEOUS) ×1 IMPLANT
NDL HYPO 25X1 1.5 SAFETY (NEEDLE) ×2 IMPLANT
NEEDLE HYPO 25X1 1.5 SAFETY (NEEDLE) ×4 IMPLANT
PACK BASIN MINOR ARMC (MISCELLANEOUS) ×2 IMPLANT
STRIP CLOSURE SKIN 1/2X4 (GAUZE/BANDAGES/DRESSINGS) ×1 IMPLANT
SUT ETHILON 3-0 FS-10 30 BLK (SUTURE) ×2
SUT MNCRL 4-0 (SUTURE) ×2
SUT MNCRL 4-0 27XMFL (SUTURE) ×1
SUT SILK 2 0 SH (SUTURE) ×2 IMPLANT
SUT VIC AB 3-0 SH 27 (SUTURE) ×2
SUT VIC AB 3-0 SH 27X BRD (SUTURE) ×1 IMPLANT
SUTURE EHLN 3-0 FS-10 30 BLK (SUTURE) ×1 IMPLANT
SUTURE MNCRL 4-0 27XMF (SUTURE) ×1 IMPLANT
SYR 10ML LL (SYRINGE) ×2 IMPLANT
WATER STERILE IRR 1000ML POUR (IV SOLUTION) ×2 IMPLANT

## 2017-08-18 NOTE — Anesthesia Post-op Follow-up Note (Signed)
Anesthesia QCDR form completed.        

## 2017-08-18 NOTE — Anesthesia Preprocedure Evaluation (Addendum)
Anesthesia Evaluation  Patient identified by MRN, date of birth, ID band Patient awake    Reviewed: Allergy & Precautions, H&P , NPO status , reviewed documented beta blocker date and time   History of Anesthesia Complications (+) PONV and history of anesthetic complications  Airway Mallampati: III  TM Distance: >3 FB Neck ROM: full    Dental  (+) Chipped   Pulmonary asthma , pneumonia, resolved,    Pulmonary exam normal        Cardiovascular + Past MI and + Peripheral Vascular Disease  Normal cardiovascular exam+ dysrhythmias   ECHO Study Conclusions  - Left ventricle: The cavity size was normal. Systolic function wasnormal. The estimated ejection fraction was in the range of 60% to 65%. Wall motion was normal; there were no regional wallmotion abnormalities. Left ventricular diastolic function parameters were normal. Study date: 09/17/2013   Neuro/Psych PSYCHIATRIC DISORDERS Anxiety Depression CVA    GI/Hepatic GERD  Medicated and Controlled,  Endo/Other    Renal/GU Renal disease     Musculoskeletal  (+) Arthritis ,   Abdominal   Peds  Hematology   Anesthesia Other Findings Pt requests no FENTANYL or PHENERGAN use Needs 100 mg Hydrocortisone   Past Medical History: No date: Acid reflux No date: Adrenal insufficiency (HCC) No date: Anxiety     Comment:  when she gets sick she gets anxious No date: Arthritis     Comment:  inflammatory arthritis No date: Asthma No date: Behcet's syndrome (HCC) No date: Chronic back pain No date: Chronic neck pain No date: Colitis No date: Depression No date: Dysrhythmia     Comment:  POTS syndrome No date: Foot drop     Comment:  due to muscle weakness from immunosuppressants No date: GERD (gastroesophageal reflux disease) No date: IBS (irritable bowel syndrome) No date: Myocardial infarction (HCC)     Comment:  per pt, mild mi due to immunosuppressant therapy No  date: Nasal ulcer     Comment:  gets occasionally from medications No date: Osteoporosis     Comment:  T score reported -2.7 No date: Pneumonia     Comment:  20 years ago No date: PONV (postoperative nausea and vomiting)     Comment:  also low blood pressure at times. do not use lactated               ringers. must have sugar with saline d/t adrenal               insufficiency No date: Refusal of blood transfusions as patient is Jehovah's Witness 2019: Renal cyst No date: Sickle cell trait (HCC) 2014: Stroke (HCC)     Comment:  small peripheral stroke. Mononeuritis multiplex 07/14/2012: Subjective visual disturbance 2019: Superficial phlebitis and thrombophlebitis of both lower  extremities No date: Tear of lateral meniscus of right knee, current     Comment:  Tear of lateral & medial meniscus of right knee No date: Tinnitus     Comment:  left ear sees Dr Ezzard Standing (ENT) No date: Uveitis 2019: Visual changes     Comment:  left eye vision only with distance, right eye has               floater. vascular changes Past Surgical History: 08/22/2013: ANTERIOR CERVICAL DECOMP/DISCECTOMY FUSION; N/A     Comment:  Procedure: ANTERIOR CERVICAL DECOMPRESSION/DISCECTOMY               FUSION 2 LEVELS;  Surgeon: Karn Cassis, MD;  Location: [facility];  Service: Neurosurgery;                Laterality: N/A;  C4-5 C5-6 Anterior cervical               decompression/diskectomy/fusion 08/22/2013: ANTERIOR FUSION CERVICAL SPINE     Comment:  c4 5  6        No date: BACK SURGERY No date: BREAST SURGERY     Comment:  Cysts excised 09/18/2013: CARDIAC CATHETERIZATION 03/2001: CESAREAN SECTION     Comment:  x 1 12/2004: CHOLECYSTECTOMY 03/2013: COLONOSCOPY No date: DILATION AND CURETTAGE OF UTERUS 07/2001: HAND SURGERY; Right     Comment:  dupytren No date: HYSTEROSCOPY 2018: IR SI JOINT INJ / ARTH LEFT W/IMAG GUIDE     Comment:  si fusion , left  2018: IR SI JOINT INJ / ARTH  RIGHT W/IMAG GUIDE; Right 11/2016: JOINT REPLACEMENT; Right     Comment:  partial knee replacement 12/12/2012: KNEE ARTHROSCOPY WITH LATERAL MENISECTOMY; Right     Comment:  Procedure: KNEE ARTHROSCOPY WITH LATERAL MENISECTOMY;                Surgeon: Nilda Simmer, MD;  Location: [facility];  Service: Orthopedics;  Laterality:               Right; 05/1998: KNEE SURGERY; Left 09/18/2013: LEFT HEART CATHETERIZATION WITH CORONARY ANGIOGRAM; N/A     Comment:  Procedure: LEFT HEART CATHETERIZATION WITH CORONARY               ANGIOGRAM;  Surgeon: Micheline Chapman, MD;  Location[facility]               CATH LAB;  Service: Cardiovascular;  Laterality: N/A; No date: OOPHORECTOMY     Comment:  BSO No date: PELVIC LAPAROSCOPY     Comment:  DL 6387: THYROID CYST EXCISION     Comment:  thyroglossal duct cyst 07/2007: VAGINAL HYSTERECTOMY     Comment:  LAVH BSO BMI    Body Mass Index:  30.58 kg/m     Reproductive/Obstetrics                           Anesthesia Physical Anesthesia Plan  ASA: III  Anesthesia Plan: General   Post-op Pain Management:    Induction: Intravenous  PONV Risk Score and Plan: 4 or greater and Ondansetron, Treatment may vary due to age or medical condition, Midazolam, Dexamethasone and Metaclopromide  Airway Management Planned: Oral ETT  Additional Equipment:   Intra-op Plan:   Post-operative Plan: Extubation in OR  Informed Consent: I have reviewed the patients History and Physical, chart, labs and discussed the procedure including the risks, benefits and alternatives for the proposed anesthesia with the patient or authorized representative who has indicated his/her understanding and acceptance.   Dental Advisory Given  Plan Discussed with: CRNA  Anesthesia Plan Comments:        Anesthesia Quick Evaluation

## 2017-08-18 NOTE — Discharge Instructions (Addendum)
°  Diet: Resume home heart healthy regular diet.   Activity: No heavy lifting >20 pounds (children, pets, laundry, garbage) or strenuous activity until pain is resolved, but light activity and walking are encouraged. Do not drive or drink alcohol if taking narcotic pain medications.  Wound care: Remove dressing before next shower. Once dressing removed, may shower with soapy water and pat dry (do not rub incisions), but no baths or submerging incision underwater until follow-up. (no swimming)   Medications: Resume all home medications. For mild to moderate pain: acetaminophen (Tylenol) or ibuprofen (if no kidney disease). Combining Tylenol with alcohol can substantially increase your risk of causing liver disease. Narcotic pain medications, if prescribed, can be used for severe pain, though may cause nausea, constipation, and drowsiness. Do not combine Tylenol and Norco within a 6 hour period as Norco contains Tylenol. If you do not need the narcotic pain medication, you do not need to fill the prescription.  Call office [phone]) at any time if any questions, worsening pain, fevers/chills, bleeding, drainage from incision site, or other concerns.   AMBULATORY SURGERY  DISCHARGE INSTRUCTIONS   1) The drugs that you were given will stay in your system until tomorrow so for the next 24 hours you should not:  A) Drive an automobile B) Make any legal decisions C) Drink any alcoholic beverage   2) You may resume regular meals tomorrow.  Today it is better to start with liquids and gradually work up to solid foods.  You may eat anything you prefer, but it is better to start with liquids, then soup and crackers, and gradually work up to solid foods.   3) Please notify your doctor immediately if you have any unusual bleeding, trouble breathing, redness and pain at the surgery site, drainage, fever, or pain not relieved by medication.    4) Additional Instructions:        Please  contact your physician with any problems or Same Day Surgery at [phone], Monday through Friday 6 am to 4 pm, or [place] at [facility] number at [phone].

## 2017-08-18 NOTE — Interval H&P Note (Signed)
History and Physical Interval Note:  08/18/2017 9:36 AM  [name]  has presented today for surgery, with the diagnosis of ACCESSORY BREAST TISSUE OF AXILLA  The various methods of treatment have been discussed with the patient and family. After consideration of risks, benefits and other options for treatment, the patient has consented to  Procedure(s): EXCISION OF RIGHT AXILLARY SOFT TISSUE MASS (Right) as a surgical intervention .  The patient's history has been reviewed, patient examined, no change in status, stable for surgery.  I have reviewed the patient's chart and labs. Right axilla marked in the pre procedure room. Questions were answered to the patient's satisfaction.     Carolan Shiver

## 2017-08-18 NOTE — Anesthesia Procedure Notes (Signed)
Procedure Name: LMA Insertion Date/Time: 08/18/2017 10:51 AM Performed by: Sherol Dade, CRNA Pre-anesthesia Checklist: Patient identified, Emergency Drugs available, Suction available, Patient being monitored and Timeout performed Patient Re-evaluated:Patient Re-evaluated prior to induction Oxygen Delivery Method: Circle system utilized Preoxygenation: Pre-oxygenation with 100% oxygen Induction Type: IV induction Ventilation: Mask ventilation without difficulty LMA: LMA inserted LMA Size: 3.0 Tube type: Oral Number of attempts: 1 Placement Confirmation: positive ETCO2,  CO2 detector and breath sounds checked- equal and bilateral Tube secured with: Tape Dental Injury: Teeth and Oropharynx as per pre-operative assessment  Comments: Atraumatic seating of AuraStraight LMA.

## 2017-08-18 NOTE — Transfer of Care (Signed)
Immediate Anesthesia Transfer of Care Note  Patient: [name]  Procedure(s) Performed: EXCISION OF RIGHT AXILLARY SOFT TISSUE MASS (Right Axilla)  Patient Location: PACU  Anesthesia Type:General  Level of Consciousness: drowsy and patient cooperative  Airway & Oxygen Therapy: Patient Spontanous Breathing and Patient connected to face mask oxygen  Post-op Assessment: Report given to RN, Post -op Vital signs reviewed and stable and Patient moving all extremities  Post vital signs: Reviewed and stable  Last Vitals:  Vitals Value Taken Time  BP 122/66 08/18/2017 12:01 PM  Temp    Pulse 83 08/18/2017 12:03 PM  Resp 9 08/18/2017 12:03 PM  SpO2 98 % 08/18/2017 12:03 PM  Vitals shown include unvalidated device data.  Last Pain:  Vitals:   08/18/17 0907  PainSc: 0-No pain         Complications: No apparent anesthesia complications

## 2017-08-18 NOTE — Brief Op Note (Signed)
08/18/2017  12:13 PM  PATIENT:  [name]  57 y.o. female  PRE-OPERATIVE DIAGNOSIS:  ACCESSORY BREAST TISSUE OF AXILLA  POST-OPERATIVE DIAGNOSIS:  ACCESSORY BREAST TISSUE OF AXILLA  PROCEDURE:  Procedure(s): EXCISION OF RIGHT AXILLARY SOFT TISSUE MASS (Right)  SURGEON:  Surgeon(s) and Role:    * Carolan Shiver, MD - Primary   ANESTHESIA:   local and general  EBL:  5 mL

## 2017-08-18 NOTE — Op Note (Signed)
Preoperative diagnosis: Right axillary mass.  Postoperative diagnosis: Right axillary mass.  Procedure: Right axillary soft tissue mass excision  Anesthesia: GETA  Surgeon: Dr. Hazle Quant  Wound Classification: Clean  Indications:  Patient is a 57 y.o. female with a palpable right axillary mass that has been growing in size and has appearance of ectopic breast tissue.    Findings: 1. Palpable mass on the right axilla 2. Adequate hemostasis  Description of procedure: The patient was taken to the operating room and placed supine on the operating table, and after general anesthesia was administered, the right axilla were prepped and draped in the usual sterile fashion. A time-out was completed verifying correct patient, procedure, site, positioning, and implant(s) and/or special equipment prior to beginning this procedure.  A skin incision incision was planned adjacent to the palpable mass. Local anesthesia was infiltrated and a skin incision was made. Flaps were raised and the location of the mass confirmed by palpation.    The mass was dissected from surrounding tissues using electrocautery. After removing the lump, the cavity was palpated and no additional abnormalities was palpated. The specimen was submitted to pathology.  Hemostasis was achieved with electrocautery and suture ligatures of 3-0 Vicryl. The subcutaneous tissue immediately under the skin was approximated with interrupted 3-0 Vicryl sutures.  The skin was closed with a subcuticular suture of running monocryl 4-0. A dressing was applied.  The patient tolerated the procedure well and was taken to the postanesthesia care unit in stable condition.   Specimen: Right axillary mass  Complications: None  Estimated Blood Loss: 56mL

## 2017-08-20 LAB — SURGICAL PATHOLOGY

## 2017-08-25 MED FILL — COLCRYS 0.6 MG TABLET: 0.6 | 30 days supply | Qty: 60 | Fill #2

## 2017-08-25 MED FILL — METOPROLOL SUCCINATE ER 50: 50 | 90 days supply | Qty: 90 | Fill #1

## 2017-08-25 NOTE — Anesthesia Postprocedure Evaluation (Signed)
Anesthesia Post Note  Patient: [name]  Procedure(s) Performed: EXCISION OF RIGHT AXILLARY SOFT TISSUE MASS (Right Axilla)  Patient location during evaluation: PACU Anesthesia Type: General Level of consciousness: awake and alert Pain management: pain level controlled Vital Signs Assessment: post-procedure vital signs reviewed and stable Respiratory status: spontaneous breathing, nonlabored ventilation and respiratory function stable Cardiovascular status: blood pressure returned to baseline and stable Postop Assessment: no apparent nausea or vomiting Anesthetic complications: no     Last Vitals:  Vitals:   08/18/17 1242 08/18/17 1303  BP: 122/71 112/64  Pulse: 76 83  Resp: 18 18  Temp: 36.6 C (!) 36.3 C  SpO2: 95% 95%    Last Pain:  Vitals:   08/18/17 1303  TempSrc: Temporal  PainSc: 0-No pain                 Christia Reading

## 2017-09-08 MED FILL — FOLIC ACID 1 MG TABS: 1 | 90 days supply | Qty: 180 | Fill #2

## 2017-09-08 MED FILL — FLUTICASONE PROP 50 MCG SPR: 50 | 60 days supply | Qty: 16 | Fill #2

## 2017-09-09 MED FILL — LANSOPRAZOLE 30 MG CPDR: 30 | 30 days supply | Qty: 30 | Fill #0

## 2017-09-10 MED FILL — METHOTREXATE 25 MG/ML VIAL: 50 | 28 days supply | Qty: 4 | Fill #0

## 2017-09-24 ENCOUNTER — Other Ambulatory Visit (HOSPITAL_COMMUNITY): Payer: Self-pay | Admitting: Neurological Surgery

## 2017-09-24 DIAGNOSIS — M5416 Radiculopathy, lumbar region: Secondary | ICD-10-CM

## 2017-09-30 ENCOUNTER — Ambulatory Visit (HOSPITAL_COMMUNITY)
Admission: RE | Admit: 2017-09-30 | Discharge: 2017-09-30 | Disposition: A | Discharge status: Home / Self Care | Payer: Medicare Other | Source: Ambulatory Visit | Attending: Neurological Surgery | Admitting: Neurological Surgery

## 2017-09-30 DIAGNOSIS — M48061 Spinal stenosis, lumbar region without neurogenic claudication: Secondary | ICD-10-CM | POA: Diagnosis not present

## 2017-09-30 DIAGNOSIS — M4316 Spondylolisthesis, lumbar region: Secondary | ICD-10-CM | POA: Diagnosis not present

## 2017-09-30 DIAGNOSIS — M5126 Other intervertebral disc displacement, lumbar region: Secondary | ICD-10-CM | POA: Insufficient documentation

## 2017-09-30 DIAGNOSIS — M5416 Radiculopathy, lumbar region: Secondary | ICD-10-CM | POA: Diagnosis present

## 2017-10-06 ENCOUNTER — Other Ambulatory Visit: Payer: Self-pay | Admitting: Neurological Surgery

## 2017-10-06 DIAGNOSIS — M5416 Radiculopathy, lumbar region: Secondary | ICD-10-CM

## 2017-10-11 MED FILL — LANSOPRAZOLE 30 MG CPDR: 30 | 30 days supply | Qty: 30 | Fill #1

## 2017-10-11 MED FILL — METHOTREXATE 25 MG/ML VIAL: 50 | 28 days supply | Qty: 4 | Fill #1

## 2017-10-11 MED FILL — predniSONE 5 MG TABS: 5 | 90 days supply | Qty: 180 | Fill #3

## 2017-10-12 ENCOUNTER — Ambulatory Visit
Admission: RE | Admit: 2017-10-12 | Discharge: 2017-10-12 | Disposition: A | Discharge status: Home / Self Care | Payer: Medicare Other | Source: Ambulatory Visit | Attending: Neurological Surgery | Admitting: Neurological Surgery

## 2017-10-12 DIAGNOSIS — M5416 Radiculopathy, lumbar region: Secondary | ICD-10-CM

## 2017-10-12 MED ORDER — IOPAMIDOL (ISOVUE-M 200) INJECTION 41%
1.0000 mL | Freq: Once | INTRAMUSCULAR | Status: AC
  Administered 2017-10-12: 1 mL via EPIDURAL

## 2017-10-12 MED ORDER — METHYLPREDNISOLONE ACETATE 40 MG/ML INJ SUSP (RADIOLOG
120.0000 mg | Freq: Once | INTRAMUSCULAR | Status: AC
  Administered 2017-10-12: 120 mg via EPIDURAL

## 2017-10-12 NOTE — Discharge Instructions (Signed)

## 2017-10-19 ENCOUNTER — Other Ambulatory Visit: Payer: Medicare Other

## 2017-10-28 MED FILL — DULoxetine HCL 60 MG CPEP: 60 | 90 days supply | Qty: 90 | Fill #3

## 2017-11-05 ENCOUNTER — Other Ambulatory Visit: Payer: Self-pay | Admitting: Internal Medicine

## 2017-11-05 DIAGNOSIS — Z1231 Encounter for screening mammogram for malignant neoplasm of breast: Secondary | ICD-10-CM

## 2017-11-08 ENCOUNTER — Ambulatory Visit
Admission: RE | Admit: 2017-11-08 | Discharge: 2017-11-08 | Disposition: A | Discharge status: Home / Self Care | Payer: Medicare Other | Source: Ambulatory Visit

## 2017-11-08 DIAGNOSIS — Z1231 Encounter for screening mammogram for malignant neoplasm of breast: Secondary | ICD-10-CM

## 2017-11-17 ENCOUNTER — Other Ambulatory Visit: Payer: Self-pay | Admitting: Neurological Surgery

## 2017-11-17 DIAGNOSIS — M5416 Radiculopathy, lumbar region: Secondary | ICD-10-CM

## 2017-11-19 MED FILL — LANSOPRAZOLE 30 MG CPDR: 30 | 30 days supply | Qty: 30 | Fill #2

## 2017-11-19 MED FILL — METOPROLOL SUCCINATE ER 50: 50 | 90 days supply | Qty: 90 | Fill #2

## 2017-11-25 ENCOUNTER — Ambulatory Visit
Admission: RE | Admit: 2017-11-25 | Discharge: 2017-11-25 | Disposition: A | Discharge status: Home / Self Care | Payer: Medicare Other | Source: Ambulatory Visit | Attending: Neurological Surgery | Admitting: Neurological Surgery

## 2017-11-25 DIAGNOSIS — M5416 Radiculopathy, lumbar region: Secondary | ICD-10-CM

## 2017-11-25 MED ORDER — METHYLPREDNISOLONE ACETATE 40 MG/ML INJ SUSP (RADIOLOG
120.0000 mg | Freq: Once | INTRAMUSCULAR | Status: AC
  Administered 2017-11-25: 120 mg via EPIDURAL

## 2017-11-25 MED ORDER — IOPAMIDOL (ISOVUE-M 200) INJECTION 41%
1.0000 mL | Freq: Once | INTRAMUSCULAR | Status: AC
  Administered 2017-11-25: 1 mL via EPIDURAL

## 2017-11-25 NOTE — Discharge Instructions (Addendum)

## 2017-12-14 MED FILL — FOLIC ACID 1 MG TABS: 1 | 90 days supply | Qty: 180 | Fill #3

## 2017-12-14 MED FILL — VENTOLIN HFA 90 MCG INHALER: 108 (90 BAS | 33 days supply | Qty: 18 | Fill #1

## 2017-12-14 MED FILL — predniSONE 1 MG TABS: 1 | 90 days supply | Qty: 90 | Fill #1

## 2017-12-14 MED FILL — METHOTREXATE 25 MG/ML VIAL: 50 | 28 days supply | Qty: 4 | Fill #2

## 2017-12-14 MED FILL — FLUTICASONE PROP 50 MCG SPR: 50 | 60 days supply | Qty: 16 | Fill #3

## 2017-12-23 MED FILL — LANSOPRAZOLE 30 MG CPDR: 30 | 30 days supply | Qty: 30 | Fill #3

## 2018-01-07 MED FILL — predniSONE 5 MG TABS: 5 | 90 days supply | Qty: 180 | Fill #0

## 2018-01-14 ENCOUNTER — Encounter: Payer: Self-pay | Admitting: Cardiovascular Disease

## 2018-01-14 ENCOUNTER — Ambulatory Visit: Payer: Medicare Other | Admitting: Cardiovascular Disease

## 2018-01-14 VITALS — BP 112/80 | HR 78 | Ht 65.0 in | Wt 191.2 lb

## 2018-01-14 DIAGNOSIS — E2749 Other adrenocortical insufficiency: Secondary | ICD-10-CM

## 2018-01-14 DIAGNOSIS — M352 Behcet's disease: Secondary | ICD-10-CM

## 2018-01-14 DIAGNOSIS — R002 Palpitations: Secondary | ICD-10-CM

## 2018-01-14 DIAGNOSIS — I951 Orthostatic hypotension: Secondary | ICD-10-CM

## 2018-01-14 NOTE — Patient Instructions (Signed)
Medication Instructions:  Dr Croitoru recommends that you continue on your current medications as directed. Please refer to the Current Medication list given to you today.  If you need a refill on your cardiac medications before your next appointment, please call your pharmacy.   Follow-Up: At [facility], you and your health needs are our priority.  As part of our continuing mission to provide you with exceptional heart care, we have created designated Provider Care Teams.  These Care Teams include your primary Cardiologist (physician) and Advanced Practice Providers (APPs -  Physician Assistants and Nurse Practitioners) who all work together to provide you with the care you need, when you need it. You will need a follow up appointment in 12 months. You may see Mihai Croitoru, MD or one of the following Advanced Practice Providers on your designated Care Team: Hao Meng, PA-C . Angela [place], PA-C . You will receive a reminder letter in the mail two months in advance. If you don't receive a letter, please call our office to schedule the follow-up appointment. 

## 2018-01-14 NOTE — Progress Notes (Signed)
Cardiology Office Note    Date:  01/15/2018   ID:  [name], DOB 1960-10-02, MRN [id number]  PCP:  Gracelyn NurseJohnston, John D, MD  Cardiologist:   Thurmon FairMihai Jalei Shibley, MD   No chief complaint on file.   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 She has numerous other medical noncardiac problems including Behcet's syndrome severe sacroiliac joint disease, gastroesophageal reflux disease, colitis with recurrent diarrhea.   Hemodynamic symptoms are generally well controlled.  She notices that she has more tachycardia towards the end of the month as her Remicade infusion.  Volume status is also quite variable, also increasing towards the end of the month with weights of 180-191 on her home scale.  Her blood pressure has been as low as 90/60 and she feels dizzy, but she has not had near syncope or syncope.  She takes fludrocortisone 0.1 mg daily in addition to prednisone and she takes metoprolol succinate 50 mg once daily.  She is currently on Remicade monthly, prednisone 10 mg daily and methotrexate 25 mg weekly for her rheumatological problem.  She is now seeing Dr. Sharene Skeanshomas Keenan at [facility].  He also monitors her labs periodically, typically every 3 months.  She is also seeing an endocrinologist, Dr. Randon GoldsmithLyles for steroid induced osteoporosis and Dr. Margarita Mailroley [facility] for dysphagia/esophageal spasm, was canceled when her symptoms improved.  She has had knee surgery and bilateral sacroiliac joint fusion.  Symptoms related to the joint complaints have been dominating her day-to-day issues, much more prominent than fatigue cardiovascular once.  She is no longer using a cane.  The patient specifically denies any chest pain at rest exertion, dyspnea at rest or with exertion, orthopnea, paroxysmal nocturnal dyspnea, syncope, focal neurological  deficits, intermittent claudication, lower extremity edema, unexplained weight gain, cough, hemoptysis or wheezing.  She has easy bruising and has had some problems with superficial thrombophlebitis that have now resolved, recurrent nasal ulcers, no active eye symptoms..   Past Medical History:  Diagnosis Date  . Acid reflux   . Adrenal insufficiency (HCC)   . Anxiety    when she gets sick she gets anxious  . Arthritis    inflammatory arthritis  . Asthma   . Behcet's syndrome (HCC)   . Chronic back pain   . Chronic neck pain   . Colitis   . Depression   . Dysrhythmia    POTS syndrome  . Foot drop    due to muscle weakness from immunosuppressants  . GERD (gastroesophageal reflux disease)   . IBS (irritable bowel syndrome)   . Myocardial infarction (HCC)    per pt, mild mi due to immunosuppressant therapy  . Nasal ulcer    gets occasionally from medications  . Osteoporosis    T score reported -2.7  . Pneumonia    20 years ago  . PONV (postoperative nausea and vomiting)    also low blood pressure at times. do not use lactated ringers. must have sugar with saline d/t adrenal insufficiency  . Refusal of blood transfusions as patient is Jehovah's Witness   . Renal cyst 2019  . Sickle cell trait (HCC)   . Stroke [facility](HCC) 2014   small peripheral stroke. Mononeuritis multiplex  . Subjective visual disturbance 07/14/2012  . Superficial phlebitis and thrombophlebitis of both lower extremities 2019  . Tear of lateral meniscus of right  knee, current    Tear of lateral & medial meniscus of right knee  . Tinnitus    left ear sees Dr Ezzard Standing (ENT)  . Uveitis   . Visual changes 2019   left eye vision only with distance, right eye has floater. vascular changes    Past Surgical History:  Procedure Laterality Date  . ANTERIOR CERVICAL DECOMP/DISCECTOMY FUSION N/A 08/22/2013   Procedure: ANTERIOR CERVICAL DECOMPRESSION/DISCECTOMY FUSION 2 LEVELS;  Surgeon: Karn Cassis, MD;  Location: [facility]  NEURO ORS;  Service: Neurosurgery;  Laterality: N/A;  C4-5 C5-6 Anterior cervical decompression/diskectomy/fusion  . ANTERIOR FUSION CERVICAL SPINE  08/22/2013   c4 5  6         . BACK SURGERY    . BREAST EXCISIONAL BIOPSY Right 1986  . BREAST SURGERY     Cysts excised  . CARDIAC CATHETERIZATION  09/18/2013  . CESAREAN SECTION  03/2001   x 1  . CHOLECYSTECTOMY  12/2004  . COLONOSCOPY  03/2013  . DILATION AND CURETTAGE OF UTERUS    . EXCISION OF BREAST LESION Right 08/18/2017   Procedure: EXCISION OF RIGHT AXILLARY SOFT TISSUE MASS;  Surgeon: Carolan Shiver, MD;  Location: [facility] ORS;  Service: General;  Laterality: Right;  . HAND SURGERY Right 07/2001   dupytren  . HYSTEROSCOPY    . IR SI JOINT INJ / ARTH LEFT W/IMAG GUIDE  2018   si fusion , left   . IR SI JOINT INJ / ARTH RIGHT W/IMAG GUIDE Right 2018  . JOINT REPLACEMENT Right 11/2016   partial knee replacement  . KNEE ARTHROSCOPY WITH LATERAL MENISECTOMY Right 12/12/2012   Procedure: KNEE ARTHROSCOPY WITH LATERAL MENISECTOMY;  Surgeon: Nilda Simmer, MD;  Location: [facility];  Service: Orthopedics;  Laterality: Right;  . KNEE SURGERY Left 05/1998  . LEFT HEART CATHETERIZATION WITH CORONARY ANGIOGRAM N/A 09/18/2013   Procedure: LEFT HEART CATHETERIZATION WITH CORONARY ANGIOGRAM;  Surgeon: Micheline Chapman, MD;  Location: [facility] CATH LAB;  Service: Cardiovascular;  Laterality: N/A;  . OOPHORECTOMY     BSO  . PELVIC LAPAROSCOPY     DL  . THYROID CYST EXCISION  1983   thyroglossal duct cyst  . VAGINAL HYSTERECTOMY  07/2007   LAVH BSO    Current Medications: Outpatient Medications Prior to Visit  Medication Sig Dispense Refill  . acetaminophen (TYLENOL) 500 MG tablet Take 500 mg by mouth 3 (three) times daily.     Marland Kitchen albuterol (PROVENTIL HFA;VENTOLIN HFA) 108 (90 BASE) MCG/ACT inhaler Inhale 1 puff into the lungs every 6 (six) hours as needed for wheezing or shortness of breath.     . Aloe-Sodium Chloride (AYR  SALINE NASAL GEL NA) Place 1 spray into the nose daily as needed (congestion).    Marland Kitchen aspirin EC 325 MG tablet Take 325 mg by mouth 2 (two) times daily.     . Calcium-Magnesium-Zinc (CAL-MAG-ZINC PO) Take 1 tablet by mouth 3 (three) times daily.    . cholecalciferol (VITAMIN D) 1000 UNITS tablet Take 1,000 Units by mouth daily at 2 PM.     . colchicine 0.6 MG tablet Take 0.6 mg by mouth 2 (two) times daily. Colcrys    . diphenhydrAMINE (BENADRYL) 25 mg capsule Take 25 mg by mouth at bedtime.     . docusate sodium (STOOL SOFTENER) 100 MG capsule Take 100 mg by mouth at bedtime.     . DULoxetine (CYMBALTA) 60 MG capsule Take 60 mg by mouth daily.    Marland Kitchen  fludrocortisone (FLORINEF) 0.1 MG tablet Take 1 tablet (0.1 mg total) by mouth daily. 30 tablet 0  . fluocinonide cream (LIDEX) 0.05 % Apply 1 application topically daily as needed (rash).     . fluticasone (FLONASE) 50 MCG/ACT nasal spray Place 1 spray into the nose daily.     . folic acid (FOLVITE) 1 MG tablet Take 2 mg by mouth daily at 2 PM.     . ibuprofen (ADVIL,MOTRIN) 200 MG tablet Take 200 mg by mouth at bedtime. May take an additional 200 mg dose during the day as needed for pain    . inFLIXimab (REMICADE) 100 MG injection Inject 700 mg into the vein every 6 (six) weeks.     . lansoprazole (PREVACID) 30 MG capsule Take 30 mg by mouth at bedtime.    . methotrexate 50 MG/2ML injection Inject 25 mg into the skin once a week. Currently on Wednesday. (Pt moves date as needed)    . metoprolol succinate (TOPROL-XL) 50 MG 24 hr tablet Take 1 tablet (50 mg total) by mouth daily. Take with or immediately following a meal. 90 tablet 3  . Oral Electrolytes (THERMOTABS PO) Take 2 tablets by mouth daily. May take an additional 2 more 2 tablet doses as needed for physical activity or heat    . OVER THE COUNTER MEDICATION Place 1 drop into both eyes as needed (dry eyes). Crocodile tears otc eye drops    . OVER THE COUNTER MEDICATION Swish and swallow 15 mLs 2  (two) times daily as needed (ulcers). Maalox mixed with liquid children's benadryl 3:1 ratio    . predniSONE (DELTASONE) 1 MG tablet Take 1-5 mg by mouth daily as needed (physical activity).     . predniSONE (DELTASONE) 5 MG tablet Take 10 mg by mouth daily with breakfast. May take a second 10 mg dose as needed for illness     No facility-administered medications prior to visit.      Allergies:   Celebrex [celecoxib]; Fentanyl; Latex; Sulfa drugs cross reactors; Sulfites; Simponi [golimumab]; Adhesive [tape]; Codeine; Epinephrine; Erythromycin; Loratadine; and Morphine and related   Social History   Socioeconomic History  . Marital status: Divorced    Spouse name: Not on file  . Number of children: 1  . Years of education: Not on file  . Highest education level: Master's degree (e.g., MA, MS, MEng, MEd, MSW, MBA)  Occupational History    Employer: [employer]  Social Needs  . Financial resource strain: Not very hard  . Food insecurity:    Worry: Never true    Inability: Never true  . Transportation needs:    Medical: No    Non-medical: No  Tobacco Use  . Smoking status: Never Smoker  . Smokeless tobacco: Never Used  Substance and Sexual Activity  . Alcohol use: No    Alcohol/week: 0.0 standard drinks  . Drug use: No  . Sexual activity: Not Currently    Birth control/protection: Surgical    Comment: 1st intercourse 22 yo-5 partners  Lifestyle  . Physical activity:    Days per week: 0 days    Minutes per session: 0 min  . Stress: Rather much  Relationships  . Social connections:    Talks on phone: More than three times a week    Gets together: Twice a week    Attends religious service: More than 4 times per year    Active member of club or organization: No    Attends meetings of clubs or  organizations: Never    Relationship status: Divorced  Other Topics Concern  . Not on file  Social History Narrative  . Not on file     Family History:  The patient's family  history includes Breast cancer in her mother and sister; Diabetes in her mother and sister; Gastric cancer in her father; Hypertension in her sister; Stroke in her mother.   ROS:   Please see the history of present illness.    ROS all other systems reviewed and are negative.   PHYSICAL EXAM:   VS:  BP 112/80   Pulse 78   Ht  (1.651 m)   Wt 191 lb 3.2 oz (86.7 kg)   BMI 31.82 kg/m     General: Alert, oriented x3, no distress, mildly obese Head: no evidence of trauma, PERRL, EOMI, no exophtalmos or lid lag, no myxedema, no xanthelasma; normal ears, nose and oropharynx Neck: normal jugular venous pulsations and no hepatojugular reflux; brisk carotid pulses without delay and no carotid bruits Chest: clear to auscultation, no signs of consolidation by percussion or palpation, normal fremitus, symmetrical and full respiratory excursions Cardiovascular: normal position and quality of the apical impulse, regular rhythm, normal first and second heart sounds, no murmurs, rubs or gallops Abdomen: no tenderness or distention, no masses by palpation, no abnormal pulsatility or arterial bruits, normal bowel sounds, no hepatosplenomegaly Extremities: no clubbing, cyanosis or edema; 2+ radial, ulnar and brachial pulses bilaterally; 2+ right femoral, posterior tibial and dorsalis pedis pulses; 2+ left femoral, posterior tibial and dorsalis pedis pulses; no subclavian or femoral bruits Neurological: grossly nonfocal Psych: Normal mood and affect   Wt Readings from Last 3 Encounters:  01/14/18 191 lb 3.2 oz (86.7 kg)  08/18/17 185 lb 3 oz (84 kg)  08/12/17 185 lb 3 oz (84 kg)      Studies/Labs Reviewed:   EKG:  EKG is ordered today.  It shows it shows normal sinus rhythm, mild reduction in QRS voltage especially his V3-V6 likely due to obesity, no repolarization abnormalities. LABS From 11/10/2017: Hemoglobin 12.2, WBC 8.1, platelets 209, normal LFTs, potassium 3.6, creatinine 0.6, Glucose  92 October 21, 2017  hemoglobin A1c 6.1%, total cholesterol 228, triglycerides 174, HDL 54, LDL 139  Lipid Panel    Component Value Date/Time   CHOL 175 09/18/2013 1030   TRIG 77 09/18/2013 1030   HDL 52 09/18/2013 1030   CHOLHDL 3.4 09/18/2013 1030   VLDL 15 09/18/2013 1030   LDLCALC 108 (H) 09/18/2013 1030     ASSESSMENT:    1. Orthostatic hypotension   2. Palpitations   3. Behcet's syndrome (HCC)   4. Iatrogenic adrenal insufficiency (HCC)      PLAN:  In order of problems listed above:  1.  Orthostatic hypotension: She believes she has POTS, but at least in part her complaints are related to hypovolemia from iatrogenic adrenal insufficiency and recurrent GI volume loss, now well compensated on fludrocortisone and improved treatment of her GI condition.  2.  Palpitations: Currently fair compensation on current metoprolol prescription. No changes made. Reviewed Corlanor as an alternative, off-label use. 3.  Behcet's: Seems to be well compensated. Sees Dr. Frederik Schmidt at [facility]. On Remicade, low dose steroids, MTX, aspirin. 4.  Iatrogenic adrenal insufficiency reminded her of the risk of volume depletion, electrolyte imbalances, need for stress dose steroids with serious illness.    Medication Adjustments/Labs and Tests Ordered: Current medicines are reviewed at length with the patient today.  Concerns regarding medicines are outlined  above.  Medication changes, Labs and Tests ordered today are listed in the Patient Instructions below. Patient Instructions  Medication Instructions:  Dr Royann Shivers recommends that you continue on your current medications as directed. Please refer to the Current Medication list given to you today.  If you need a refill on your cardiac medications before your next appointment, please call your pharmacy.   Follow-Up: At [facility], you and your health needs are our priority.  As part of our continuing mission to provide you with exceptional heart  care, we have created designated Provider Care Teams.  These Care Teams include your primary Cardiologist (physician) and Advanced Practice Providers (APPs -  Physician Assistants and Nurse Practitioners) who all work together to provide you with the care you need, when you need it. You will need a follow up appointment in 12 months. You may see Thurmon Fair, MD or one of the following Advanced Practice Providers on your designated Care Team: [place], [place] . Micah Flesher, PA-C . You will receive a reminder letter in the mail two months in advance. If you don't receive a letter, please call our office to schedule the follow-up appointment.    Signed, Thurmon Fair, MD  01/15/2018 11:38 AM    [facility] [address] [place], [place], [place]  [address] Phone: [phone]; Fax: [phone]

## 2018-01-15 DIAGNOSIS — E2749 Other adrenocortical insufficiency: Secondary | ICD-10-CM | POA: Insufficient documentation

## 2018-01-26 MED FILL — LANSOPRAZOLE 30 MG CPDR: 30 | 30 days supply | Qty: 30 | Fill #4

## 2018-01-27 MED FILL — DULOXETINE HCL 60 MG CPEP: 60 | 90 days supply | Qty: 90 | Fill #0

## 2018-01-29 ENCOUNTER — Other Ambulatory Visit: Payer: Self-pay

## 2018-01-29 ENCOUNTER — Encounter (HOSPITAL_COMMUNITY): Payer: Self-pay | Admitting: Emergency Medicine

## 2018-01-29 ENCOUNTER — Ambulatory Visit (HOSPITAL_COMMUNITY)
Admission: EM | Admit: 2018-01-29 | Discharge: 2018-01-29 | Disposition: A | Discharge status: Home / Self Care | Payer: Medicare Other | Attending: Emergency Medicine | Admitting: Emergency Medicine

## 2018-01-29 DIAGNOSIS — R350 Frequency of micturition: Secondary | ICD-10-CM

## 2018-01-29 DIAGNOSIS — R300 Dysuria: Secondary | ICD-10-CM | POA: Diagnosis not present

## 2018-01-29 DIAGNOSIS — N390 Urinary tract infection, site not specified: Secondary | ICD-10-CM | POA: Insufficient documentation

## 2018-01-29 LAB — POCT URINALYSIS DIP (DEVICE)
Bilirubin Urine: NEGATIVE
Glucose, UA: NEGATIVE mg/dL
Ketones, ur: NEGATIVE mg/dL
Leukocytes, UA: NEGATIVE
Nitrite: NEGATIVE
Protein, ur: NEGATIVE mg/dL
Specific Gravity, Urine: 1.015 (ref 1.005–1.030)
Urobilinogen, UA: 0.2 mg/dL (ref 0.0–1.0)
pH: 7.5 (ref 5.0–8.0)

## 2018-01-29 MED ORDER — PHENAZOPYRIDINE HCL 100 MG PO TABS: 100.0000 mg | ORAL_TABLET | Freq: Three times a day (TID) | ORAL | 0 refills | Status: DC | PRN

## 2018-01-29 MED ORDER — CIPROFLOXACIN HCL 500 MG PO TABS: 500.0000 mg | ORAL_TABLET | Freq: Two times a day (BID) | ORAL | 0 refills | Status: DC

## 2018-01-29 NOTE — Discharge Instructions (Addendum)
Should symptoms persist or worsen or crisis begins patient should go directly to the emergency room

## 2018-01-29 NOTE — ED Triage Notes (Signed)
The patient presented to the [facility] with a complaint of urinary frequency and chills that started 2 days ago.

## 2018-01-29 NOTE — ED Provider Notes (Signed)
[facility]    CSN: [id number] Arrival date & time: 01/29/18  1502     History   Chief Complaint Chief Complaint  Patient presents with  . Urinary Frequency    HPI [name] is a 58 y.o. female.   58 year old female presents with increased urinary frequency with "burning" sensation abdominal cramping with generalized cramping throughout her body x1 day.  Patient states that she has neurological deficiencies to her bladder L5 fusion.  And describes issues with adrenal insufficiency that she takes prednisone on a daily basis.  Due to her complex history patient reminded to go to emergency room for further evaluation however patient declines at this time patient and family member at bedside request treatment for urinary tract infection and verbally agreed to go to emergency room should the situation worsen.       Past Medical History:  Diagnosis Date  . Acid reflux   . Adrenal insufficiency (HCC)   . Anxiety    when she gets sick she gets anxious  . Arthritis    inflammatory arthritis  . Asthma   . Behcet's syndrome (HCC)   . Chronic back pain   . Chronic neck pain   . Colitis   . Depression   . Dysrhythmia    POTS syndrome  . Foot drop    due to muscle weakness from immunosuppressants  . GERD (gastroesophageal reflux disease)   . IBS (irritable bowel syndrome)   . Myocardial infarction (HCC)    per pt, mild mi due to immunosuppressant therapy  . Nasal ulcer    gets occasionally from medications  . Osteoporosis    T score reported -2.7  . Pneumonia    20 years ago  . PONV (postoperative nausea and vomiting)    also low blood pressure at times. do not use lactated ringers. must have sugar with saline d/t adrenal insufficiency  . Refusal of blood transfusions as patient is Jehovah's Witness   . Renal cyst 2019  . Sickle cell trait (HCC)   . Stroke [facility](HCC) 2014   small peripheral stroke. Mononeuritis multiplex  . Subjective visual  disturbance 07/14/2012  . Superficial phlebitis and thrombophlebitis of both lower extremities 2019  . Tear of lateral meniscus of right knee, current    Tear of lateral & medial meniscus of right knee  . Tinnitus    left ear sees Dr Ezzard StandingNewman (ENT)  . Uveitis   . Visual changes 2019   left eye vision only with distance, right eye has floater. vascular changes    Patient Active Problem List   Diagnosis Date Noted  . Iatrogenic adrenal insufficiency (HCC) 01/15/2018  . Orthostatic hypotension 12/07/2013  . Adrenal insufficiency (HCC) 09/25/2013  . Palpitations 09/16/2013  . Cervical stenosis of spinal canal 08/22/2013  . Lumbar degenerative disc disease 12/30/2012  . Tear of lateral meniscus of right knee, current   . Osteopenia   . Lumbar back pain 09/13/2012  . Subjective visual disturbance 07/14/2012  . Endometriosis   . Endometrial polyp   . IBS (irritable bowel syndrome)   . Acid reflux   . Colitis   . Behcet's syndrome (HCC)   . Glucose intolerance (impaired glucose tolerance) 12/19/2010  . Behcet's disease (HCC) 01/06/2004    Past Surgical History:  Procedure Laterality Date  . ANTERIOR CERVICAL DECOMP/DISCECTOMY FUSION N/A 08/22/2013   Procedure: ANTERIOR CERVICAL DECOMPRESSION/DISCECTOMY FUSION 2 LEVELS;  Surgeon: Karn CassisErnesto M Botero, MD;  Location: [facility];  Service: Neurosurgery;  Laterality: N/A;  C4-5 C5-6 Anterior cervical decompression/diskectomy/fusion  . ANTERIOR FUSION CERVICAL SPINE  08/22/2013   c4 5  6         . BACK SURGERY    . BREAST EXCISIONAL BIOPSY Right 1986  . BREAST SURGERY     Cysts excised  . CARDIAC CATHETERIZATION  09/18/2013  . CESAREAN SECTION  03/2001   x 1  . CHOLECYSTECTOMY  12/2004  . COLONOSCOPY  03/2013  . DILATION AND CURETTAGE OF UTERUS    . EXCISION OF BREAST LESION Right 08/18/2017   Procedure: EXCISION OF RIGHT AXILLARY SOFT TISSUE MASS;  Surgeon: Carolan Shiver, MD;  Location: [facility];  Service: General;  Laterality:  Right;  . HAND SURGERY Right 07/2001   dupytren  . HYSTEROSCOPY    . IR SI JOINT INJ / ARTH LEFT W/IMAG GUIDE  2018   si fusion , left   . IR SI JOINT INJ / ARTH RIGHT W/IMAG GUIDE Right 2018  . JOINT REPLACEMENT Right 11/2016   partial knee replacement  . KNEE ARTHROSCOPY WITH LATERAL MENISECTOMY Right 12/12/2012   Procedure: KNEE ARTHROSCOPY WITH LATERAL MENISECTOMY;  Surgeon: Nilda Simmer, MD;  Location:  SURGERY CENTER;  Service: Orthopedics;  Laterality: Right;  . KNEE SURGERY Left 05/1998  . LEFT HEART CATHETERIZATION WITH CORONARY ANGIOGRAM N/A 09/18/2013   Procedure: LEFT HEART CATHETERIZATION WITH CORONARY ANGIOGRAM;  Surgeon: Micheline Chapman, MD;  Location: [facility] C[facility];  Service: Cardiovascular;  Laterality: N/A;  . OOPHORECTOMY     BSO  . PELVIC LAPAROSCOPY     DL  . THYROID CYST EXCISION  1983   thyroglossal duct cyst  . VAGINAL HYSTERECTOMY  07/2007   LAVH BSO    OB History    Gravida  3   Para  1   Term  1   Preterm      AB  2   Living  1     SAB      TAB      Ectopic      Multiple      Live Births               Home Medications    Prior to Admission medications   Medication Sig Start Date End Date Taking? Authorizing Provider  inFLIXimab (REMICADE) 100 MG injection Inject 700 mg into the vein every 6 (six) weeks.    Yes [provider]  acetaminophen (TYLENOL) 500 MG tablet Take 500 mg by mouth 3 (three) times daily.     [provider]  albuterol (PROVENTIL HFA;VENTOLIN HFA) 108 (90 BASE) MCG/ACT inhaler Inhale 1 puff into the lungs every 6 (six) hours as needed for wheezing or shortness of breath.     [provider]  Aloe-Sodium Chloride (AYR SALINE NASAL GEL NA) Place 1 spray into the nose daily as needed (congestion).    [provider]  aspirin EC 325 MG tablet Take 325 mg by mouth 2 (two) times daily.     [provider]  Calcium-Magnesium-Zinc (CAL-MAG-ZINC PO) Take 1 tablet by  mouth 3 (three) times daily.    [provider]  cholecalciferol (VITAMIN D) 1000 UNITS tablet Take 1,000 Units by mouth daily at 2 PM.     [provider]  colchicine 0.6 MG tablet Take 0.6 mg by mouth 2 (two) times daily. Colcrys 12/19/10   Moses Manners, MD  diphenhydrAMINE (BENADRYL) 25 mg capsule Take 25 mg by mouth at bedtime.  [provider]  docusate sodium (STOOL SOFTENER) 100 MG capsule Take 100 mg by mouth at bedtime.     [provider]  DULoxetine (CYMBALTA) 60 MG capsule Take 60 mg by mouth daily.    [provider]  fludrocortisone (FLORINEF) 0.1 MG tablet Take 1 tablet (0.1 mg total) by mouth daily. 09/19/13   Joseph Art, DO  fluocinonide cream (LIDEX) 0.05 % Apply 1 application topically daily as needed (rash).  05/07/15   [provider]  fluticasone (FLONASE) 50 MCG/ACT nasal spray Place 1 spray into the nose daily.     [provider]  folic acid (FOLVITE) 1 MG tablet Take 2 mg by mouth daily at 2 PM.     [provider]  ibuprofen (ADVIL,MOTRIN) 200 MG tablet Take 200 mg by mouth at bedtime. May take an additional 200 mg dose during the day as needed for pain    [provider]  lansoprazole (PREVACID) 30 MG capsule Take 30 mg by mouth at bedtime.    [provider]  methotrexate 50 MG/2ML injection Inject 25 mg into the skin once a week. Currently on Wednesday. (Pt moves date as needed) 02/20/15   [provider]  metoprolol succinate (TOPROL-XL) 50 MG 24 hr tablet Take 1 tablet (50 mg total) by mouth daily. Take with or immediately following a meal. 12/18/16   Croitoru, Mihai, MD  Oral Electrolytes (THERMOTABS PO) Take 2 tablets by mouth daily. May take an additional 2 more 2 tablet doses as needed for physical activity or heat    [provider]  OVER THE COUNTER MEDICATION Place 1 drop into both eyes as needed (dry eyes). Crocodile tears otc eye drops     [provider]  OVER THE COUNTER MEDICATION Swish and swallow 15 mLs 2 (two) times daily as needed (ulcers). Maalox mixed with liquid children's benadryl 3:1 ratio    [provider]  predniSONE (DELTASONE) 1 MG tablet Take 1-5 mg by mouth daily as needed (physical activity).     [provider]  predniSONE (DELTASONE) 5 MG tablet Take 10 mg by mouth daily with breakfast. May take a second 10 mg dose as needed for illness    [provider]    Family History Family History  Problem Relation Age of Onset  . Diabetes Mother   . Breast cancer Mother        Age 34's  . Stroke Mother   . Gastric cancer Father   . Diabetes Sister   . Hypertension Sister   . Breast cancer Sister        Age 39    Social History Social History   Tobacco Use  . Smoking status: Never Smoker  . Smokeless tobacco: Never Used  Substance Use Topics  . Alcohol use: No    Alcohol/week: 0.0 standard drinks  . Drug use: No     Allergies   Celebrex [celecoxib]; Fentanyl; Latex; Sulfa drugs cross reactors; Sulfites; Simponi [golimumab]; Adhesive [tape]; Codeine; Epinephrine; Erythromycin; Loratadine; and Morphine and related   Review of Systems Review of Systems  Constitutional: Negative for chills and fever.  HENT: Negative for ear pain and sore throat.   Eyes: Negative for pain and visual disturbance.  Respiratory: Negative for cough and shortness of breath.   Cardiovascular: Negative for chest pain and palpitations.  Gastrointestinal: Negative for abdominal pain and vomiting.  Genitourinary: Positive for dysuria, frequency and urgency. Negative for hematuria.  Musculoskeletal: Positive for myalgias (  generalized). Negative for arthralgias and back pain.  Skin: Negative for color change and rash.  Neurological: Negative for seizures and syncope.  All other systems reviewed and are negative.    Physical Exam Triage Vital Signs ED Triage Vitals  Enc Vitals Group       BP 01/29/18 1534 134/82     Pulse Rate 01/29/18 1534 96     Resp 01/29/18 1534 18     Temp 01/29/18 1534 98.3 F (36.8 C)     Temp Source 01/29/18 1534 Oral     SpO2 01/29/18 1534 99 %     Weight --      Height --      Head Circumference --      Peak Flow --      Pain Score 01/29/18 1531 8     Pain Loc --      Pain Edu? --      Excl. in GC? --    No data found.  Updated Vital Signs BP 134/82 (BP Location: Right Arm)   Pulse 96   Temp 98.3 F (36.8 C) (Oral)   Resp 18   SpO2 99%   Visual Acuity Right Eye Distance:   Left Eye Distance:   Bilateral Distance:    Right Eye Near:   Left Eye Near:    Bilateral Near:     Physical Exam Vitals signs and nursing note reviewed.  Constitutional:      Appearance: She is well-developed.  HENT:     Head: Normocephalic and atraumatic.  Eyes:     Conjunctiva/sclera: Conjunctivae normal.  Neck:     Musculoskeletal: Normal range of motion.  Pulmonary:     Effort: Pulmonary effort is normal.  Abdominal:     General: There is no distension.     Palpations: There is no mass.     Tenderness: There is abdominal tenderness ( generalized pain with palpation ).  Neurological:     Mental Status: She is alert and oriented to person, place, and time.      UC Treatments / Results  Labs (all labs ordered are listed, but only abnormal results are displayed) Labs Reviewed  POCT URINALYSIS DIP (DEVICE) - Abnormal; Notable for the following components:      Result Value   Hgb urine dipstick TRACE (*)    All other components within normal limits    EKG None  Radiology No results found.  Procedures Procedures (including critical care time)  Medications Ordered in UC Medications - No data to display  Initial Impression / Assessment and Plan / UC Course  I have reviewed the triage vital signs and the nursing notes.  Pertinent labs & imaging results that were available during my care of the patient were reviewed by me and  considered in my medical decision making (see chart for details).      Final Clinical Impressions(s) / UC Diagnoses   Final diagnoses:  None   Discharge Instructions   None    ED Prescriptions    None     Controlled Substance Prescriptions [place] Controlled Substance Registry consulted? Not Applicable   Alene Mires, NP 01/29/18 2014

## 2018-02-08 MED FILL — predniSONE 2.5 MG TABS: 2.5 | 30 days supply | Qty: 30 | Fill #0

## 2018-02-17 ENCOUNTER — Other Ambulatory Visit: Payer: Self-pay | Admitting: Cardiovascular Disease

## 2018-02-17 DIAGNOSIS — M352 Behcet's disease: Secondary | ICD-10-CM

## 2018-02-17 DIAGNOSIS — E2740 Unspecified adrenocortical insufficiency: Secondary | ICD-10-CM

## 2018-02-17 MED FILL — METHOTREXATE 25 MG/ML VIAL: 50 | 28 days supply | Qty: 4 | Fill #3

## 2018-02-17 MED FILL — METOPROLOL SUCCINATE ER 50: 50 | 90 days supply | Qty: 90 | Fill #0

## 2018-03-07 MED FILL — LANSOPRAZOLE 30 MG CPDR: 30 | 30 days supply | Qty: 30 | Fill #5

## 2018-03-23 MED FILL — predniSONE 5 MG TABS: 5 | 90 days supply | Qty: 180 | Fill #1

## 2018-03-30 MED FILL — LANSOPRAZOLE 30 MG CPDR: 30 | 90 days supply | Qty: 90 | Fill #0

## 2018-03-30 MED FILL — FOLIC ACID 1 MG TABS: 1 | 90 days supply | Qty: 180 | Fill #0

## 2018-04-27 MED FILL — DULOXETINE HCL 60 MG CPEP: 60 | 90 days supply | Qty: 90 | Fill #1

## 2018-05-23 MED FILL — METOPROLOL SUCCINATE ER 50: 50 | 90 days supply | Qty: 90 | Fill #1

## 2018-05-31 MED FILL — predniSONE 5 MG TABS: 5 | 90 days supply | Qty: 180 | Fill #2

## 2018-06-02 MED FILL — LEFLUNOMIDE 20 MG TABLET: 20 | 30 days supply | Qty: 30 | Fill #0

## 2018-06-17 MED FILL — FLUTICASONE PROP 50 MCG SPR: 50 | 60 days supply | Qty: 16 | Fill #0

## 2018-07-02 MED FILL — LEFLUNOMIDE 20 MG TABLET: 20 | 30 days supply | Qty: 30 | Fill #1

## 2018-07-02 MED FILL — LANSOPRAZOLE 30 MG CPDR: 30 | 90 days supply | Qty: 90 | Fill #1

## 2018-07-02 MED FILL — FOLIC ACID 1 MG TABS: 1 | 90 days supply | Qty: 180 | Fill #1

## 2018-07-27 MED FILL — LEFLUNOMIDE 20 MG TABLET: 20 | 30 days supply | Qty: 30 | Fill #0

## 2018-07-27 MED FILL — DULOXETINE HCL 60 MG CPEP: 60 | 90 days supply | Qty: 90 | Fill #2

## 2018-08-08 MED FILL — predniSONE 5 MG TABS: 5 | 90 days supply | Qty: 180 | Fill #3

## 2018-08-15 ENCOUNTER — Other Ambulatory Visit: Payer: Self-pay | Admitting: Neurological Surgery

## 2018-08-15 DIAGNOSIS — M5416 Radiculopathy, lumbar region: Secondary | ICD-10-CM

## 2018-08-17 MED FILL — METOPROLOL SUCCINATE ER 50: 50 | 90 days supply | Qty: 90 | Fill #2

## 2018-08-19 ENCOUNTER — Ambulatory Visit
Admission: RE | Admit: 2018-08-19 | Discharge: 2018-08-19 | Disposition: A | Discharge status: Home / Self Care | Payer: Medicare Other | Source: Ambulatory Visit | Attending: Neurological Surgery | Admitting: Neurological Surgery

## 2018-08-19 DIAGNOSIS — M5416 Radiculopathy, lumbar region: Secondary | ICD-10-CM

## 2018-08-19 MED ORDER — IOPAMIDOL (ISOVUE-M 200) INJECTION 41%
1.0000 mL | Freq: Once | INTRAMUSCULAR | Status: AC
  Administered 2018-08-19: 1 mL via EPIDURAL

## 2018-08-19 MED ORDER — METHYLPREDNISOLONE ACETATE 40 MG/ML INJ SUSP (RADIOLOG
120.0000 mg | Freq: Once | INTRAMUSCULAR | Status: AC
  Administered 2018-08-19: 13:00:00 120 mg via EPIDURAL

## 2018-08-19 NOTE — Discharge Instructions (Signed)

## 2018-08-23 ENCOUNTER — Other Ambulatory Visit: Payer: Medicare Other

## 2018-08-31 ENCOUNTER — Other Ambulatory Visit: Payer: Self-pay | Admitting: Orthopedic Surgery

## 2018-08-31 ENCOUNTER — Ambulatory Visit: Payer: Medicare Other

## 2018-08-31 DIAGNOSIS — M5442 Lumbago with sciatica, left side: Secondary | ICD-10-CM

## 2018-08-31 DIAGNOSIS — G8929 Other chronic pain: Secondary | ICD-10-CM

## 2018-09-05 MED FILL — LEFLUNOMIDE 20 MG TABLET: 20 | 30 days supply | Qty: 30 | Fill #1

## 2018-09-30 MED FILL — LANSOPRAZOLE 30 MG CPDR: 30 | 90 days supply | Qty: 90 | Fill #0

## 2018-09-30 MED FILL — LEFLUNOMIDE 20 MG TABLET: 20 | 90 days supply | Qty: 90 | Fill #0

## 2018-10-12 MED FILL — FOLIC ACID 1 MG TABS: 1 | 90 days supply | Qty: 180 | Fill #2

## 2018-10-26 MED FILL — predniSONE 5 MG TABS: 5 | 90 days supply | Qty: 180 | Fill #0

## 2018-10-26 MED FILL — DULOXETINE HCL 60 MG CPEP: 60 | 90 days supply | Qty: 90 | Fill #3

## 2018-11-03 MED FILL — CREON DR 12,000 UNITS CAP: 12000 | 30 days supply | Qty: 320 | Fill #0

## 2018-11-15 MED FILL — GABAPENTIN 100 MG CAPSULE: 100 | 30 days supply | Qty: 90 | Fill #1

## 2018-11-15 MED FILL — METOPROLOL SUCCINATE ER 50: 50 | 90 days supply | Qty: 90 | Fill #3

## 2018-11-15 MED FILL — FLUTICASONE PROP 50 MCG SPR: 50 | 60 days supply | Qty: 16 | Fill #1

## 2018-11-28 MED FILL — predniSONE 10 MG TABS: 10 | 90 days supply | Qty: 90 | Fill #0

## 2018-12-04 NOTE — Progress Notes (Signed)
Cardiology Office Note:    Date:  12/05/2018   ID:  [name], DOB January 20, 1960, MRN [id number]  PCP:  Gardner Candle, MD  Cardiologist:  Thurmon Fair, MD   Referring MD: Gracelyn Nurse, MD   Chief Complaint  Patient presents with  . Pre-op Exam    History of Present Illness:    [name] is a 58 y.o. female with a hx of Behcet's disease, orthostatic hypotension, orthostatic tachycardia, and iatrogenic adrenal insufficiency. She was last seen by Dr. Royann Shivers on 01/14/18. She has multiple noncardiac problems. She genearlly has more tachcyardia towards the end of the month related to her remicade infusion. Volume also fluctuates across the month. She is followed at [facility] rheumatology. She also has steroid induced osteoporosis. She was scheduled to see GI for dysphagia/esophageal spasm, but canceled the appt when symptoms resolved.  She had an abnormal nuclear stress test in 2015, but follow up coronary angiography revealed normal coronaries (09/18/13).   She was evaluated by Dr. Graciela Husbands who did not think her symptoms were consistent with POTS. Per Dr. Royann Shivers, orthostatic hypotension related to hypovolemia from iatrogenic adrenal insufficiency. Much improved on fludrocortisone and with treatment of her GI conditions that resulted in GI volume loss. Palpitations are controlled with lopressor. Dr. Royann Shivers has also suggested corlanor (would be off-label use) if she continued to have problems. She was last seen in clinic by Dr. Royann Shivers 01/14/18 and was doing well from a cardiac perspective.   In the interim, she is now on creon for fatty stool. Her chronic diarrhea has improved to just two per day after the creon.   She has been diagnosed with OSA and is on CPAP with good compliance and improved index. She feels much better during the day after initiating CPAP.    She is scheduled to have stabilization of L4-5 with [facility]. We have been asked for  cardiac clearance. She suffers what sounds like chronic chest pain and shortness of breath. The chest pressure occurs when she has a bout of tachycardia that occurs when she stands for a long period of time. She becomes diaphoretic and tachycardic to the 130s. All of these symptoms resolve when she moves around/walks around. This has been her baseline for several years.  She does moderate house work, she is able to walk up a flight of stairs, she is able to walk 1 block.  According to the Antwan Pandya Activity Index, she is able to complete 4-5 METS. She has generally altered how she performs tasks to meet her abilities. She has completed 2 SI joint fusions and a knee replacement without cardiac complications. She is not able to do water aerobics due to COVID. She is excited to complete the surgery so that she has better activity tolerance.    Past Medical History:  Diagnosis Date  . Acid reflux   . Adrenal insufficiency (HCC)   . Anxiety    when she gets sick she gets anxious  . Arthritis    inflammatory arthritis  . Asthma   . Behcet's syndrome (HCC)   . Chronic back pain   . Chronic neck pain   . Colitis   . Depression   . Dysrhythmia    POTS syndrome  . Foot drop    due to muscle weakness from immunosuppressants  . GERD (gastroesophageal reflux disease)   . Gluten intolerance   . IBS (irritable bowel syndrome)   . Myocardial infarction (HCC)    per pt, mild  mi due to immunosuppressant therapy  . Nasal ulcer    gets occasionally from medications  . Osteoporosis    T score reported -2.7  . Pneumonia    20 years ago  . PONV (postoperative nausea and vomiting)    also low blood pressure at times. do not use lactated ringers. must have sugar with saline d/t adrenal insufficiency  . Refusal of blood transfusions as patient is Jehovah's Witness   . Renal cyst 2019  . Sickle cell trait (HCC)   . Stroke [facility](HCC) 2014   small peripheral stroke. Mononeuritis multiplex  . Subjective visual  disturbance 07/14/2012  . Superficial phlebitis and thrombophlebitis of both lower extremities 2019  . Tear of lateral meniscus of right knee, current    Tear of lateral & medial meniscus of right knee  . Tinnitus    left ear sees Dr Ezzard StandingNewman (ENT)  . Uveitis   . Visual changes 2019   left eye vision only with distance, right eye has floater. vascular changes    Past Surgical History:  Procedure Laterality Date  . ANTERIOR CERVICAL DECOMP/DISCECTOMY FUSION N/A 08/22/2013   Procedure: ANTERIOR CERVICAL DECOMPRESSION/DISCECTOMY FUSION 2 LEVELS;  Surgeon: Karn CassisErnesto M Botero, MD;  Location: [facility];  Service: Neurosurgery;  Laterality: N/A;  C4-5 C5-6 Anterior cervical decompression/diskectomy/fusion  . ANTERIOR FUSION CERVICAL SPINE  08/22/2013   c4 5  6         . BACK SURGERY    . BREAST EXCISIONAL BIOPSY Right 1986  . BREAST SURGERY     Cysts excised  . CARDIAC CATHETERIZATION  09/18/2013  . CESAREAN SECTION  03/2001   x 1  . CHOLECYSTECTOMY  12/2004  . COLONOSCOPY  03/2013  . DILATION AND CURETTAGE OF UTERUS    . EXCISION OF BREAST LESION Right 08/18/2017   Procedure: EXCISION OF RIGHT AXILLARY SOFT TISSUE MASS;  Surgeon: Carolan Shiverintron-Diaz, Edgardo, MD;  Location: [facility] ORS;  Service: General;  Laterality: Right;  . HAND SURGERY Right 07/2001   dupytren  . HYSTEROSCOPY    . IR SI JOINT INJ / ARTH LEFT W/IMAG GUIDE  2018   si fusion , left   . IR SI JOINT INJ / ARTH RIGHT W/IMAG GUIDE Right 2018  . JOINT REPLACEMENT Right 11/2016   partial knee replacement  . KNEE ARTHROSCOPY WITH LATERAL MENISECTOMY Right 12/12/2012   Procedure: KNEE ARTHROSCOPY WITH LATERAL MENISECTOMY;  Surgeon: Nilda Simmerobert A Wainer, MD;  Location: [facility];  Service: Orthopedics;  Laterality: Right;  . KNEE SURGERY Left 05/1998  . LEFT HEART CATHETERIZATION WITH CORONARY ANGIOGRAM N/A 09/18/2013   Procedure: LEFT HEART CATHETERIZATION WITH CORONARY ANGIOGRAM;  Surgeon: Micheline ChapmanMichael D Cooper, MD;  [facility]  CATH LAB;  Service: Cardiovascular;  Laterality: N/A;  . OOPHORECTOMY     BSO  . PELVIC LAPAROSCOPY     DL  . THYROID CYST EXCISION  1983   thyroglossal duct cyst  . VAGINAL HYSTERECTOMY  07/2007   LAVH BSO    Current Medications: Current Meds  Medication Sig  . acetaminophen (TYLENOL) 500 MG tablet Take 500 mg by mouth 3 (three) times daily.   Marland Kitchen. albuterol (PROVENTIL HFA;VENTOLIN HFA) 108 (90 BASE) MCG/ACT inhaler Inhale 1 puff into the lungs every 6 (six) hours as needed for wheezing or shortness of breath.   . Aloe-Sodium Chloride (AYR SALINE NASAL GEL NA) Place 1 spray into the nose daily as needed (congestion).  Marland Kitchen. aspirin EC 325 MG tablet Take 325 mg by mouth  2 (two) times daily.   . Calcium-Magnesium-Zinc (CAL-MAG-ZINC PO) Take 1 tablet by mouth 3 (three) times daily.  . cholecalciferol (VITAMIN D) 1000 UNITS tablet Take 1,000 Units by mouth daily at 2 PM.   . colchicine 0.6 MG tablet Take 0.6 mg by mouth 2 (two) times daily. Colcrys  . diphenhydrAMINE (BENADRYL) 25 mg capsule Take 25 mg by mouth at bedtime.   . docusate sodium (STOOL SOFTENER) 100 MG capsule Take 100 mg by mouth at bedtime.   . DULoxetine (CYMBALTA) 60 MG capsule Take 60 mg by mouth daily.  . fluocinonide cream (LIDEX) 0.05 % Apply 1 application topically daily as needed (rash).   . fluticasone (FLONASE) 50 MCG/ACT nasal spray Place 1 spray into the nose daily.   . folic acid (FOLVITE) 1 MG tablet Take 2 mg by mouth daily at 2 PM.   . gabapentin (NEURONTIN) 100 MG capsule Take 100 mg by mouth 3 (three) times daily.  Marland Kitchen ibuprofen (ADVIL,MOTRIN) 200 MG tablet Take 200 mg by mouth at bedtime. May take an additional 200 mg dose during the day as needed for pain  . inFLIXimab (REMICADE) 100 MG injection Inject 700 mg into the vein every 6 (six) weeks.   . lansoprazole (PREVACID) 30 MG capsule Take 30 mg by mouth at bedtime.  . lipase/protease/amylase (CREON) 12000 units CPEP capsule   . metoprolol succinate (TOPROL-XL)  50 MG 24 hr tablet TAKE 1 TABLET BY MOUTH ONCE DAILY. TAKE WITH OR IMMEDIATELY FOLLOWING A MEAL  . Oral Electrolytes (THERMOTABS PO) Take 2 tablets by mouth daily. May take an additional 2 more 2 tablet doses as needed for physical activity or heat  . OVER THE COUNTER MEDICATION Place 1 drop into both eyes as needed (dry eyes). Crocodile tears otc eye drops  . OVER THE COUNTER MEDICATION Swish and swallow 15 mLs 2 (two) times daily as needed (ulcers). Maalox mixed with liquid children's benadryl 3:1 ratio  . predniSONE (DELTASONE) 5 MG tablet Take 10 mg by mouth daily with breakfast. May take a second 10 mg dose as needed for illness     Allergies:   Celebrex [celecoxib], Fentanyl, Latex, Sulfa drugs cross reactors, Sulfites, Simponi [golimumab], Adhesive [tape], Codeine, Epinephrine, Erythromycin, Loratadine, and Morphine and related   Social History   Socioeconomic History  . Marital status: Divorced    Spouse name: Not on file  . Number of children: 1  . Years of education: Not on file  . Highest education level: Master's degree (e.g., MA, MS, MEng, MEd, MSW, MBA)  Occupational History    Employer: [employer]  Social Needs  . Financial resource strain: Not very hard  . Food insecurity    Worry: Never true    Inability: Never true  . Transportation needs    Medical: No    Non-medical: No  Tobacco Use  . Smoking status: Never Smoker  . Smokeless tobacco: Never Used  Substance and Sexual Activity  . Alcohol use: No    Alcohol/week: 0.0 standard drinks  . Drug use: No  . Sexual activity: Not Currently    Birth control/protection: Surgical    Comment: 1st intercourse 22 yo-5 partners  Lifestyle  . Physical activity    Days per week: 0 days    Minutes per session: 0 min  . Stress: Rather much  Relationships  . Social connections    Talks on phone: More than three times a week    Gets together: Twice a week  Attends religious service: More than 4 times per year    Active  member of club or organization: No    Attends meetings of clubs or organizations: Never    Relationship status: Divorced  Other Topics Concern  . Not on file  Social History Narrative  . Not on file     Family History: The patient's family history includes Breast cancer in her mother and sister; Diabetes in her mother and sister; Gastric cancer in her father; Hypertension in her sister; Stroke in her mother.  ROS:   Please see the history of present illness.    All other systems reviewed and are negative.  EKGs/Labs/Other Studies Reviewed:    The following studies were reviewed today:  Left heart cath 2015: Final Conclusions:   1. Angiographically normal coronary arteries  2. Normal LV function  Suspect noncardiac chest pain  EKG:  EKG is ordered today.  The ekg ordered today demonstrates sinus rhythm with HR 83  Recent Labs: No results found for requested labs within last 8760 hours.  Recent Lipid Panel    Component Value Date/Time   CHOL 175 09/18/2013 1030   TRIG 77 09/18/2013 1030   HDL 52 09/18/2013 1030   CHOLHDL 3.4 09/18/2013 1030   VLDL 15 09/18/2013 1030   LDLCALC 108 (H) 09/18/2013 1030    Physical Exam:    VS:  BP 123/78   Pulse 89   Temp (!) 96.8 F (36 C)   Ht 5\' 5"  (1.651 m)   Wt 191 lb 9.6 oz (86.9 kg)   SpO2 95%   BMI 31.88 kg/m     Wt Readings from Last 3 Encounters:  12/05/18 191 lb 9.6 oz (86.9 kg)  01/14/18 191 lb 3.2 oz (86.7 kg)  08/18/17 185 lb 3 oz (84 kg)     GEN: Well nourished, well developed in no acute distress HEENT: Normal NECK: No JVD; No carotid bruits LYMPHATICS: No lymphadenopathy CARDIAC: RRR, no murmurs, rubs, gallops RESPIRATORY:  Clear to auscultation without rales, wheezing or rhonchi  ABDOMEN: Soft, non-tender, non-distended MUSCULOSKELETAL:  No edema; No deformity  SKIN: Warm and dry NEUROLOGIC:  Alert and oriented x 3 PSYCHIATRIC:  Normal affect   ASSESSMENT:    1. Preoperative cardiovascular  examination   2. Preoperative clearance   3. Palpitations   4. Orthostatic hypotension   5. Iatrogenic adrenal insufficiency (HCC)   6. Behcet's syndrome (HCC)    PLAN:    In order of problems listed above:  Preoperative evaluation for cardiac complications She is able to complete 4.0 METS without anginal symptoms. No obstructive disease found on heart cath in  2015 following an abnormal nuclear stress test. She has normal renal function and does not require insulin. According to the RCRI, she has a 6.6% risk of a major cardiac event, due to her history of stroke. She has had 3 major surgeries since 2015 without cardiac complications. She is at acceptable risk for the planned procedure without additional testing.    Palpitations Stable. Continue BB.    Orthostatic hypotension related to volume status and adrenal insufficiency  Behcet's disease She is very good at managing her symptoms. She was recently placed on creon for fatty stool which has significantly improved her GI symptoms. Overall, given her co-morbidities, I think she is doing very well.    Medication Adjustments/Labs and Tests Ordered: Current medicines are reviewed at length with the patient today.  Concerns regarding medicines are outlined above.  Orders Placed This Encounter  Procedures  . EKG 12-Lead   No orders of the defined types were placed in this encounter.   Signed, Marcelino Dusterngela Nicole Bethzaida Boord, PA  12/05/2018 4:29 PM     [facility]

## 2018-12-05 ENCOUNTER — Other Ambulatory Visit: Payer: Self-pay

## 2018-12-05 ENCOUNTER — Encounter: Payer: Self-pay | Admitting: Physician Assistant

## 2018-12-05 ENCOUNTER — Telehealth: Payer: Self-pay | Admitting: Physician Assistant

## 2018-12-05 ENCOUNTER — Ambulatory Visit (INDEPENDENT_AMBULATORY_CARE_PROVIDER_SITE_OTHER): Payer: Medicare Other | Admitting: Physician Assistant

## 2018-12-05 VITALS — BP 123/78 | HR 89 | Temp 96.8°F | Ht 65.0 in | Wt 191.6 lb

## 2018-12-05 DIAGNOSIS — E2749 Other adrenocortical insufficiency: Secondary | ICD-10-CM

## 2018-12-05 DIAGNOSIS — I951 Orthostatic hypotension: Secondary | ICD-10-CM | POA: Diagnosis not present

## 2018-12-05 DIAGNOSIS — R002 Palpitations: Secondary | ICD-10-CM

## 2018-12-05 DIAGNOSIS — Z01818 Encounter for other preprocedural examination: Secondary | ICD-10-CM | POA: Diagnosis not present

## 2018-12-05 DIAGNOSIS — M352 Behcet's disease: Secondary | ICD-10-CM

## 2018-12-05 DIAGNOSIS — Z01810 Encounter for preprocedural cardiovascular examination: Secondary | ICD-10-CM

## 2018-12-05 NOTE — Telephone Encounter (Signed)
   Primary Cardiologist: Sanda Klein, MD  Chart reviewed as part of pre-operative protocol coverage.  [name] was seen on 12/05/18 by [facility] for preoperative clearance.   She is able to complete 4.0 METS without anginal symptoms. No obstructive disease found on heart cath in  2015 following an abnormal nuclear stress test. She has normal renal function and does not require insulin. According to the RCRI, she has a 6.6% risk of a major cardiac event, due to her history of stroke. She has had 3 major surgeries since 2015 without cardiac complications.    Therefore, based on ACC/AHA guidelines, the patient would be at acceptable risk for the planned procedure without further cardiovascular testing.   I will route this recommendation to the requesting party via Epic fax function and remove from pre-op pool.  Please call with questions.  Ledora Bottcher, PA 12/05/2018, 4:33 PM

## 2018-12-05 NOTE — Progress Notes (Signed)
TY

## 2018-12-05 NOTE — Patient Instructions (Signed)
Medication Instructions:  Your physician recommends that you continue on your current medications as directed. Please refer to the Current Medication list given to you today.  *If you need a refill on your cardiac medications before your next appointment, please call your pharmacy*   Follow-Up: At [facility], you and your health needs are our priority.  As part of our continuing mission to provide you with exceptional heart care, we have created designated Provider Care Teams.  These Care Teams include your primary Cardiologist (physician) and Advanced Practice Providers (APPs -  Physician Assistants and Nurse Practitioners) who all work together to provide you with the care you need, when you need it.  Your next appointment:   Please keep your scheduled follow-up appointment with Dr. Sallyanne Kuster in February.  Other Instructions Please come to the office tomorrow to pick up your clearance letter.

## 2018-12-06 MED FILL — CREON DR 12,000 UNITS CAP: 12000 | 30 days supply | Qty: 320 | Fill #1

## 2018-12-09 MED FILL — POTASSIUM CHL ER M10 TABLET: 10 | 90 days supply | Qty: 90 | Fill #0

## 2018-12-15 MED FILL — GABAPENTIN 100 MG CAPSULE: 100 | 30 days supply | Qty: 90 | Fill #0

## 2018-12-27 MED FILL — oxyCODONE HCL 5 MG TABS: 5 | 7 days supply | Qty: 84 | Fill #0

## 2019-01-03 MED FILL — LEFLUNOMIDE 20 MG TABLET: 20 | 90 days supply | Qty: 90 | Fill #1

## 2019-01-03 MED FILL — LANSOPRAZOLE 30 MG CPDR: 30 | 90 days supply | Qty: 90 | Fill #1

## 2019-01-16 MED FILL — FOLIC ACID 1 MG TABS: 1 | 90 days supply | Qty: 180 | Fill #3

## 2019-01-16 MED FILL — GABAPENTIN 100 MG CAPSULE: 100 | 30 days supply | Qty: 90 | Fill #1

## 2019-01-16 MED FILL — CREON DR 12,000 UNITS CAP: 12000 | 30 days supply | Qty: 320 | Fill #2

## 2019-01-30 MED FILL — DULOXETINE HCL 60 MG CPEP: 60 | 90 days supply | Qty: 90 | Fill #0

## 2019-02-08 MED FILL — CREON DR 12,000 UNITS CAP: 12000 | 29 days supply | Qty: 320 | Fill #0

## 2019-02-15 ENCOUNTER — Encounter (HOSPITAL_COMMUNITY): Payer: Self-pay | Admitting: *Deleted

## 2019-02-15 ENCOUNTER — Observation Stay (HOSPITAL_COMMUNITY)
Admission: EM | Admit: 2019-02-15 | Discharge: 2019-02-17 | Disposition: A | Discharge status: Home / Self Care | Payer: Medicare Other | Attending: Internal Medicine | Admitting: Internal Medicine

## 2019-02-15 ENCOUNTER — Other Ambulatory Visit: Payer: Self-pay

## 2019-02-15 ENCOUNTER — Emergency Department (HOSPITAL_COMMUNITY): Payer: Medicare Other

## 2019-02-15 DIAGNOSIS — Z20822 Contact with and (suspected) exposure to covid-19: Secondary | ICD-10-CM | POA: Diagnosis not present

## 2019-02-15 DIAGNOSIS — R0789 Other chest pain: Secondary | ICD-10-CM | POA: Diagnosis not present

## 2019-02-15 DIAGNOSIS — Z8673 Personal history of transient ischemic attack (TIA), and cerebral infarction without residual deficits: Secondary | ICD-10-CM | POA: Diagnosis not present

## 2019-02-15 DIAGNOSIS — R7302 Impaired glucose tolerance (oral): Secondary | ICD-10-CM | POA: Diagnosis not present

## 2019-02-15 DIAGNOSIS — F419 Anxiety disorder, unspecified: Secondary | ICD-10-CM | POA: Insufficient documentation

## 2019-02-15 DIAGNOSIS — M549 Dorsalgia, unspecified: Secondary | ICD-10-CM | POA: Insufficient documentation

## 2019-02-15 DIAGNOSIS — E876 Hypokalemia: Secondary | ICD-10-CM | POA: Diagnosis not present

## 2019-02-15 DIAGNOSIS — Z7952 Long term (current) use of systemic steroids: Secondary | ICD-10-CM | POA: Insufficient documentation

## 2019-02-15 DIAGNOSIS — I252 Old myocardial infarction: Secondary | ICD-10-CM | POA: Diagnosis not present

## 2019-02-15 DIAGNOSIS — J45909 Unspecified asthma, uncomplicated: Secondary | ICD-10-CM | POA: Insufficient documentation

## 2019-02-15 DIAGNOSIS — Z79899 Other long term (current) drug therapy: Secondary | ICD-10-CM | POA: Insufficient documentation

## 2019-02-15 DIAGNOSIS — Z96651 Presence of right artificial knee joint: Secondary | ICD-10-CM | POA: Insufficient documentation

## 2019-02-15 DIAGNOSIS — Z7982 Long term (current) use of aspirin: Secondary | ICD-10-CM | POA: Insufficient documentation

## 2019-02-15 DIAGNOSIS — K529 Noninfective gastroenteritis and colitis, unspecified: Secondary | ICD-10-CM | POA: Diagnosis not present

## 2019-02-15 DIAGNOSIS — R079 Chest pain, unspecified: Secondary | ICD-10-CM | POA: Diagnosis not present

## 2019-02-15 DIAGNOSIS — K219 Gastro-esophageal reflux disease without esophagitis: Secondary | ICD-10-CM | POA: Diagnosis not present

## 2019-02-15 DIAGNOSIS — I498 Other specified cardiac arrhythmias: Secondary | ICD-10-CM | POA: Insufficient documentation

## 2019-02-15 DIAGNOSIS — F329 Major depressive disorder, single episode, unspecified: Secondary | ICD-10-CM | POA: Insufficient documentation

## 2019-02-15 DIAGNOSIS — D573 Sickle-cell trait: Secondary | ICD-10-CM | POA: Diagnosis not present

## 2019-02-15 DIAGNOSIS — G8929 Other chronic pain: Secondary | ICD-10-CM | POA: Diagnosis not present

## 2019-02-15 DIAGNOSIS — M352 Behcet's disease: Secondary | ICD-10-CM | POA: Diagnosis not present

## 2019-02-15 LAB — DIFFERENTIAL
Abs Immature Granulocytes: 0.02 10*3/uL (ref 0.00–0.07)
Basophils Absolute: 0.1 10*3/uL (ref 0.0–0.1)
Basophils Relative: 1 %
Eosinophils Absolute: 0 10*3/uL (ref 0.0–0.5)
Eosinophils Relative: 0 %
Immature Granulocytes: 0 %
Lymphocytes Relative: 20 %
Lymphs Abs: 1.5 10*3/uL (ref 0.7–4.0)
Monocytes Absolute: 0.8 10*3/uL (ref 0.1–1.0)
Monocytes Relative: 11 %
Neutro Abs: 5 10*3/uL (ref 1.7–7.7)
Neutrophils Relative %: 68 %

## 2019-02-15 LAB — I-STAT CHEM 8, ED
BUN: 7 mg/dL (ref 6–20)
Calcium, Ion: 0.79 mmol/L — CL (ref 1.15–1.40)
Chloride: 112 mmol/L — ABNORMAL HIGH (ref 98–111)
Creatinine, Ser: 0.4 mg/dL — ABNORMAL LOW (ref 0.44–1.00)
Glucose, Bld: 85 mg/dL (ref 70–99)
HCT: 30 % — ABNORMAL LOW (ref 36.0–46.0)
Hemoglobin: 10.2 g/dL — ABNORMAL LOW (ref 12.0–15.0)
Potassium: 2.6 mmol/L — CL (ref 3.5–5.1)
Sodium: 146 mmol/L — ABNORMAL HIGH (ref 135–145)
TCO2: 23 mmol/L (ref 22–32)

## 2019-02-15 LAB — CBC
HCT: 41 % (ref 36.0–46.0)
Hemoglobin: 13.1 g/dL (ref 12.0–15.0)
MCH: 30 pg (ref 26.0–34.0)
MCHC: 32 g/dL (ref 30.0–36.0)
MCV: 93.8 fL (ref 80.0–100.0)
Platelets: UNDETERMINED 10*3/uL (ref 150–400)
RBC: 4.37 MIL/uL (ref 3.87–5.11)
RDW: 14.3 % (ref 11.5–15.5)
WBC: 7.3 10*3/uL (ref 4.0–10.5)
nRBC: 0 % (ref 0.0–0.2)

## 2019-02-15 LAB — BASIC METABOLIC PANEL
Anion gap: 10 (ref 5–15)
BUN: 10 mg/dL (ref 6–20)
CO2: 27 mmol/L (ref 22–32)
Calcium: 9.3 mg/dL (ref 8.9–10.3)
Chloride: 104 mmol/L (ref 98–111)
Creatinine, Ser: 0.68 mg/dL (ref 0.44–1.00)
GFR calc Af Amer: >60 mL/min (ref >60)
GFR calc non Af Amer: >60 mL/min (ref >60)
Glucose, Bld: 107 mg/dL — ABNORMAL HIGH (ref 70–99)
Potassium: 3.5 mmol/L (ref 3.5–5.1)
Sodium: 141 mmol/L (ref 135–145)

## 2019-02-15 LAB — GLUCOSE, CAPILLARY: Glucose-Capillary: 125 mg/dL — ABNORMAL HIGH (ref 70–99)

## 2019-02-15 LAB — BASIC METABOLIC PANEL WITH GFR
Anion gap: 7 (ref 5–15)
BUN: 9 mg/dL (ref 6–20)
CO2: 28 mmol/L (ref 22–32)
Calcium: 9.2 mg/dL (ref 8.9–10.3)
Chloride: 110 mmol/L (ref 98–111)
Creatinine, Ser: 0.58 mg/dL (ref 0.44–1.00)
GFR calc Af Amer: >60 mL/min
GFR calc non Af Amer: >60 mL/min
Glucose, Bld: 139 mg/dL — ABNORMAL HIGH (ref 70–99)
Potassium: 4.4 mmol/L (ref 3.5–5.1)
Sodium: 145 mmol/L (ref 135–145)

## 2019-02-15 LAB — I-STAT BETA HCG BLOOD, ED (MC, WL, AP ONLY): I-stat hCG, quantitative: <5 m[IU]/mL (ref <5)

## 2019-02-15 LAB — TROPONIN I (HIGH SENSITIVITY)
Troponin I (High Sensitivity): <2 ng/L (ref <18)
Troponin I (High Sensitivity): <2 ng/L (ref <18)

## 2019-02-15 LAB — MAGNESIUM: Magnesium: 2 mg/dL (ref 1.7–2.4)

## 2019-02-15 LAB — SARS CORONAVIRUS 2 (TAT 6-24 HRS): SARS Coronavirus 2: NEGATIVE

## 2019-02-15 LAB — PHOSPHORUS: Phosphorus: 4.1 mg/dL (ref 2.5–4.6)

## 2019-02-15 MED ORDER — ONDANSETRON HCL 4 MG PO TABS: 4.0000 mg | ORAL_TABLET | Freq: Four times a day (QID) | ORAL | Status: DC | PRN

## 2019-02-15 MED ORDER — DIPHENHYDRAMINE HCL 25 MG PO CAPS
25.0000 mg | ORAL_CAPSULE | Freq: Every day | ORAL | Status: DC
  Administered 2019-02-15 – 2019-02-16 (×2): 25 mg via ORAL
  Filled 2019-02-15 (×2): qty 1

## 2019-02-15 MED ORDER — ZIPRASIDONE MESYLATE 20 MG IM SOLR
20.0000 mg | Freq: Once | INTRAMUSCULAR | Status: AC
  Administered 2019-02-15: 20 mg via INTRAMUSCULAR
  Filled 2019-02-15: qty 20

## 2019-02-15 MED ORDER — METOPROLOL SUCCINATE ER 50 MG PO TB24
50.0000 mg | ORAL_TABLET | Freq: Every day | ORAL | Status: DC
  Administered 2019-02-16 – 2019-02-17 (×2): 50 mg via ORAL
  Filled 2019-02-15 (×2): qty 1

## 2019-02-15 MED ORDER — FLUTICASONE PROPIONATE 50 MCG/ACT NA SUSP: 1.0000 | Freq: Every day | NASAL | Status: DC | PRN

## 2019-02-15 MED ORDER — POTASSIUM CHLORIDE CRYS ER 20 MEQ PO TBCR
20.0000 meq | EXTENDED_RELEASE_TABLET | Freq: Every day | ORAL | Status: DC
  Administered 2019-02-16: 20 meq via ORAL
  Filled 2019-02-15: qty 1

## 2019-02-15 MED ORDER — POTASSIUM CHLORIDE CRYS ER 20 MEQ PO TBCR
40.0000 meq | EXTENDED_RELEASE_TABLET | Freq: Once | ORAL | Status: AC
  Administered 2019-02-15: 40 meq via ORAL
  Filled 2019-02-15: qty 2

## 2019-02-15 MED ORDER — CALCIUM GLUCONATE-NACL 1-0.675 GM/50ML-% IV SOLN
1.0000 g | Freq: Once | INTRAVENOUS | Status: AC
  Administered 2019-02-15: 1000 mg via INTRAVENOUS
  Filled 2019-02-15: qty 50

## 2019-02-15 MED ORDER — IBUPROFEN 200 MG PO TABS: 400.0000 mg | ORAL_TABLET | Freq: Four times a day (QID) | ORAL | Status: DC | PRN

## 2019-02-15 MED ORDER — STERILE WATER FOR INJECTION IJ SOLN
INTRAMUSCULAR | Status: AC
  Administered 2019-02-15: 10 mL
  Filled 2019-02-15: qty 10

## 2019-02-15 MED ORDER — CALCITRIOL 0.25 MCG PO CAPS
0.2500 ug | ORAL_CAPSULE | Freq: Two times a day (BID) | ORAL | Status: DC
  Administered 2019-02-15 – 2019-02-17 (×4): 0.25 ug via ORAL
  Filled 2019-02-15 (×7): qty 1

## 2019-02-15 MED ORDER — DULOXETINE HCL 30 MG PO CPEP
60.0000 mg | ORAL_CAPSULE | Freq: Every day | ORAL | Status: DC
  Administered 2019-02-16 – 2019-02-17 (×2): 60 mg via ORAL
  Filled 2019-02-15 (×2): qty 2

## 2019-02-15 MED ORDER — ENOXAPARIN SODIUM 40 MG/0.4ML ~~LOC~~ SOLN
40.0000 mg | SUBCUTANEOUS | Status: DC
  Administered 2019-02-15 – 2019-02-16 (×2): 40 mg via SUBCUTANEOUS
  Filled 2019-02-15 (×2): qty 0.4

## 2019-02-15 MED ORDER — FOLIC ACID 1 MG PO TABS
2.0000 mg | ORAL_TABLET | Freq: Every day | ORAL | Status: DC
  Administered 2019-02-15 – 2019-02-17 (×3): 2 mg via ORAL
  Filled 2019-02-15 (×4): qty 2

## 2019-02-15 MED ORDER — OXYCODONE HCL 5 MG PO TABS: 5.0000 mg | ORAL_TABLET | Freq: Four times a day (QID) | ORAL | Status: DC | PRN

## 2019-02-15 MED ORDER — PREDNISONE 20 MG PO TABS
20.0000 mg | ORAL_TABLET | Freq: Every day | ORAL | Status: DC
  Administered 2019-02-16: 20 mg via ORAL
  Filled 2019-02-15: qty 1

## 2019-02-15 MED ORDER — ONDANSETRON HCL 4 MG/2ML IJ SOLN: 4.0000 mg | Freq: Four times a day (QID) | INTRAMUSCULAR | Status: DC | PRN

## 2019-02-15 MED ORDER — POTASSIUM CHLORIDE 10 MEQ/100ML IV SOLN
10.0000 meq | INTRAVENOUS | Status: AC
  Administered 2019-02-15 (×2): 10 meq via INTRAVENOUS
  Filled 2019-02-15 (×2): qty 100

## 2019-02-15 MED ORDER — POTASSIUM CHLORIDE IN NACL 40-0.9 MEQ/L-% IV SOLN
INTRAVENOUS | Status: AC
  Filled 2019-02-15: qty 1000

## 2019-02-15 MED ORDER — POTASSIUM CHLORIDE IN NACL 40-0.9 MEQ/L-% IV SOLN
INTRAVENOUS | Status: DC
  Administered 2019-02-15: 125 mL/h via INTRAVENOUS
  Filled 2019-02-15 (×2): qty 1000

## 2019-02-15 MED ORDER — PANCRELIPASE (LIP-PROT-AMYL) 12000-38000 UNITS PO CPEP
12000.0000 [IU] | ORAL_CAPSULE | Freq: Three times a day (TID) | ORAL | Status: DC
  Administered 2019-02-16 (×3): 12000 [IU] via ORAL
  Filled 2019-02-15 (×3): qty 1

## 2019-02-15 MED ORDER — GABAPENTIN 300 MG PO CAPS
300.0000 mg | ORAL_CAPSULE | Freq: Every day | ORAL | Status: DC
  Administered 2019-02-15 – 2019-02-16 (×2): 300 mg via ORAL
  Filled 2019-02-15 (×2): qty 1

## 2019-02-15 MED ORDER — SODIUM CHLORIDE 0.9 % IV SOLN
1.0000 g | Freq: Once | INTRAVENOUS | Status: DC
  Filled 2019-02-15: qty 10

## 2019-02-15 MED ORDER — COLCHICINE 0.6 MG PO TABS
0.6000 mg | ORAL_TABLET | Freq: Two times a day (BID) | ORAL | Status: DC
  Administered 2019-02-15 – 2019-02-17 (×4): 0.6 mg via ORAL
  Filled 2019-02-15 (×6): qty 1

## 2019-02-15 MED ORDER — SODIUM CHLORIDE 0.9% FLUSH: 3.0000 mL | Freq: Once | INTRAVENOUS | Status: DC

## 2019-02-15 MED ORDER — ACETAMINOPHEN 650 MG RE SUPP: 650.0000 mg | Freq: Four times a day (QID) | RECTAL | Status: DC | PRN

## 2019-02-15 MED ORDER — ALBUTEROL SULFATE (2.5 MG/3ML) 0.083% IN NEBU: 3.0000 mL | INHALATION_SOLUTION | Freq: Four times a day (QID) | RESPIRATORY_TRACT | Status: DC | PRN

## 2019-02-15 MED ORDER — DOCUSATE SODIUM 100 MG PO CAPS
100.0000 mg | ORAL_CAPSULE | Freq: Every day | ORAL | Status: DC
  Administered 2019-02-15: 22:00:00 100 mg via ORAL
  Filled 2019-02-15 (×2): qty 1

## 2019-02-15 MED ORDER — ACETAMINOPHEN 325 MG PO TABS
650.0000 mg | ORAL_TABLET | Freq: Four times a day (QID) | ORAL | Status: DC | PRN
  Administered 2019-02-16: 21:00:00 650 mg via ORAL
  Filled 2019-02-15: qty 2

## 2019-02-15 MED ORDER — LEFLUNOMIDE 20 MG PO TABS
20.0000 mg | ORAL_TABLET | Freq: Every day | ORAL | Status: DC
  Administered 2019-02-16: 20 mg via ORAL
  Filled 2019-02-15 (×2): qty 1

## 2019-02-15 MED FILL — predniSONE 20 MG TABS: 20 | 30 days supply | Qty: 90 | Fill #0

## 2019-02-15 MED FILL — POTASSIUM CHLORIDE CRYS ER: 20 | 90 days supply | Qty: 270 | Fill #0

## 2019-02-15 NOTE — H&P (Signed)
History and Physical    [name] POE:[id number] DOB: Feb 13, 1960 DOA: 02/15/2019  PCP: Evalee Mutton, MD   Patient coming from: Home.  I have personally briefly reviewed patient's old medical records in [place]  Chief Complaint: Chest pain and hypokalemia.  HPI: [name] is a 59 y.o. female with medical history significant of acid reflux, adrenal insufficiency, anxiety, osteoarthritis, history of asthma but says disease, chronic back pain, chronic neck pain, history of colitis, depression, POTS syndrome, foot drop, GERD, gluten intolerance, IBS, history of mild MI due to immunosuppressant therapy, history of nasal ulcer, osteoporosis, history of pneumonia, renal cysts, sickle cell trait, history of a small peripheral stroke, history of superficial phlebitis and thrombophlebitis, right knee lateral meniscus tear, tinnitus of the left ear, uveitis who is coming to the emergency department due to chest pain nonradiated, associated with mild dyspnea, but denies dizziness, palpitations, PND, orthopnea or recent pitting edema of the lower extremities.  No fever, chills, sore throat or rhinorrhea.  ED Course: Initial vital signs temperature 98.3 F, pulse 95, respirations 24, blood pressure 118/75 mmHg O2 sat 100% on room air.  The patient received potassium supplementation and IVF in the emergency department.  CBC was normal.  Potassium was 2.6 mmol/L when the patient developed her muscle cramping symptoms.  Troponin was normal.  EKG did not have any acute ischemic changes.  Her chest radiograph was unremarkable.  Review of Systems: As per HPI otherwise 10 point review of systems negative.   Past Medical History:  Diagnosis Date  . Acid reflux   . Adrenal insufficiency ([place])   . Anxiety    when she gets sick she gets anxious  . Arthritis    inflammatory arthritis  . Asthma   . Behcet's syndrome ([place])   . Chronic back pain   . Chronic neck pain     . Colitis   . Depression   . Dysrhythmia    POTS syndrome  . Foot drop    due to muscle weakness from immunosuppressants  . GERD (gastroesophageal reflux disease)   . Gluten intolerance   . IBS (irritable bowel syndrome)   . Myocardial infarction ([place])    per pt, mild mi due to immunosuppressant therapy  . Nasal ulcer    gets occasionally from medications  . Osteoporosis    T score reported -2.7  . Pneumonia    20 years ago  . PONV (postoperative nausea and vomiting)    also low blood pressure at times. do not use lactated ringers. must have sugar with saline d/t adrenal insufficiency  . Refusal of blood transfusions as patient is Jehovah's Witness   . Renal cyst 2019  . Sickle cell trait ([place])   . Stroke [facility]) 2014   small peripheral stroke. Mononeuritis multiplex  . Subjective visual disturbance 07/14/2012  . Superficial phlebitis and thrombophlebitis of both lower extremities 2019  . Tear of lateral meniscus of right knee, current    Tear of lateral & medial meniscus of right knee  . Tinnitus    left ear sees Dr Lucia Gaskins (ENT)  . Uveitis   . Visual changes 2019   left eye vision only with distance, right eye has floater. vascular changes    Past Surgical History:  Procedure Laterality Date  . ANTERIOR CERVICAL DECOMP/DISCECTOMY FUSION N/A 08/22/2013   Procedure: ANTERIOR CERVICAL DECOMPRESSION/DISCECTOMY FUSION 2 LEVELS;  Surgeon: Floyce Stakes, MD;  Location: [facility];  Service: Neurosurgery;  Laterality: N/A;  C4-5 C5-6 Anterior cervical decompression/diskectomy/fusion  . ANTERIOR FUSION CERVICAL SPINE  08/22/2013   c4 5  6         . BACK SURGERY    . BREAST EXCISIONAL BIOPSY Right 1986  . BREAST SURGERY     Cysts excised  . CARDIAC CATHETERIZATION  09/18/2013  . CESAREAN SECTION  03/2001   x 1  . CHOLECYSTECTOMY  12/2004  . COLONOSCOPY  03/2013  . DILATION AND CURETTAGE OF UTERUS    . EXCISION OF BREAST LESION Right 08/18/2017   Procedure: EXCISION OF RIGHT  AXILLARY SOFT TISSUE MASS;  Surgeon: Carolan Shiver, MD;  Location: [facility] ORS;  Service: General;  Laterality: Right;  . HAND SURGERY Right 07/2001   dupytren  . HYSTEROSCOPY    . IR SI JOINT INJ / ARTH LEFT W/IMAG GUIDE  2018   si fusion , left   . IR SI JOINT INJ / ARTH RIGHT W/IMAG GUIDE Right 2018  . JOINT REPLACEMENT Right 11/2016   partial knee replacement  . KNEE ARTHROSCOPY WITH LATERAL MENISECTOMY Right 12/12/2012   Procedure: KNEE ARTHROSCOPY WITH LATERAL MENISECTOMY;  Surgeon: Nilda Simmer, MD;  Location: [facility];  Service: Orthopedics;  Laterality: Right;  . KNEE SURGERY Left 05/1998  . LEFT HEART CATHETERIZATION WITH CORONARY ANGIOGRAM N/A 09/18/2013   Procedure: LEFT HEART CATHETERIZATION WITH CORONARY ANGIOGRAM;  Surgeon: Micheline Chapman, MD;  Location: [facility];  Service: Cardiovascular;  Laterality: N/A;  . OOPHORECTOMY     BSO  . PELVIC LAPAROSCOPY     DL  . THYROID CYST EXCISION  1983   thyroglossal duct cyst  . VAGINAL HYSTERECTOMY  07/2007   LAVH BSO     reports that she has never smoked. She has never used smokeless tobacco. She reports that she does not drink alcohol or use drugs.  Allergies  Allergen Reactions  . Celebrex [Celecoxib] Other (See Comments)    Bleeding/bruising including bleeding for multiple days This is a reaction d/t sulfa product  . Fentanyl Nausea And Vomiting    Had mild heart attack after last surgery along with vomiting severely. Does NOT want fentanyl at all.  Versed and valium are okay  . Latex Shortness Of Breath and Dermatitis  . Sulfa Drugs Cross Reactors Other (See Comments)    Eyes turn red, renal failure, stomach problems (vomiting, diarrhea), vasculature collapses, migraine like symptoms  . Sulfites Other (See Comments)    Eyes turn red, renal failure, stomach problems (vomiting, diarrhea), vasculature collapses, migraine like symptoms  . Simponi [Golimumab] Swelling    Simponi Aria- Headache,  blurred vision. (similar to remicade, but uses human dna)  . Adhesive [Tape] Other (See Comments)    Pulls skin right off.  PAPER TAPE is okay. tegaderm is also okay  . Codeine Itching and Nausea And Vomiting    Tolerates hydrocodone   . Epinephrine Other (See Comments)    Tachycardia (this is a normal side effect of this medication)  . Erythromycin Itching and Rash    Has tolerated azithromycin  . Loratadine Itching  . Morphine And Related Itching and Rash    Family History  Problem Relation Age of Onset  . Diabetes Mother   . Breast cancer Mother        Age 11's  . Stroke Mother   . Gastric cancer Father   . Diabetes Sister   . Hypertension Sister   . Breast cancer Sister  Age 34   Prior to Admission medications   Medication Sig Start Date End Date Taking? Authorizing Provider  acetaminophen (TYLENOL) 500 MG tablet Take 500 mg by mouth 3 (three) times daily.     [provider]  albuterol (PROVENTIL HFA;VENTOLIN HFA) 108 (90 BASE) MCG/ACT inhaler Inhale 1 puff into the lungs every 6 (six) hours as needed for wheezing or shortness of breath.     [provider]  Aloe-Sodium Chloride (AYR SALINE NASAL GEL NA) Place 1 spray into the nose daily as needed (congestion).    [provider]  aspirin EC 325 MG tablet Take 325 mg by mouth 2 (two) times daily.     [provider]  calcitRIOL (ROCALTROL) 0.25 MCG capsule  02/08/19   [provider]  Calcium-Magnesium-Zinc (CAL-MAG-ZINC PO) Take 1 tablet by mouth 3 (three) times daily.    [provider]  cholecalciferol (VITAMIN D) 1000 UNITS tablet Take 1,000 Units by mouth daily at 2 PM.     [provider]  colchicine 0.6 MG tablet Take 0.6 mg by mouth 2 (two) times daily. Colcrys 12/19/10   Moses Manners, MD  diphenhydrAMINE (BENADRYL) 25 mg capsule Take 25 mg by mouth at bedtime.     [provider]  docusate sodium (STOOL SOFTENER) 100 MG capsule Take  100 mg by mouth at bedtime.     [provider]  DULoxetine (CYMBALTA) 60 MG capsule Take 60 mg by mouth daily.    [provider]  fluocinonide cream (LIDEX) 0.05 % Apply 1 application topically daily as needed (rash).  05/07/15   [provider]  fluticasone (FLONASE) 50 MCG/ACT nasal spray Place 1 spray into the nose daily.     [provider]  folic acid (FOLVITE) 1 MG tablet Take 2 mg by mouth daily at 2 PM.     [provider]  gabapentin (NEURONTIN) 100 MG capsule Take 100 mg by mouth 3 (three) times daily. 11/15/18   [provider]  ibuprofen (ADVIL,MOTRIN) 200 MG tablet Take 200 mg by mouth at bedtime. May take an additional 200 mg dose during the day as needed for pain    [provider]  inFLIXimab (REMICADE) 100 MG injection Inject 700 mg into the vein every 6 (six) weeks.     [provider]  lansoprazole (PREVACID) 30 MG capsule Take 30 mg by mouth at bedtime.    [provider]  leflunomide (ARAVA) 20 MG tablet Take 20 mg by mouth daily. 01/03/19   [provider]  lipase/protease/amylase (CREON) 12000 units CPEP capsule  11/03/18   [provider]  metoprolol succinate (TOPROL-XL) 50 MG 24 hr tablet TAKE 1 TABLET BY MOUTH ONCE DAILY. TAKE WITH OR IMMEDIATELY FOLLOWING A MEAL 02/17/18   Croitoru, Mihai, MD  Oral Electrolytes (THERMOTABS PO) Take 2 tablets by mouth daily. May take an additional 2 more 2 tablet doses as needed for physical activity or heat    [provider]  OVER THE COUNTER MEDICATION Place 1 drop into both eyes as needed (dry eyes). Crocodile tears otc eye drops    [provider]  OVER THE COUNTER MEDICATION Swish and swallow 15 mLs 2 (two) times daily as needed (ulcers). Maalox mixed with liquid children's benadryl 3:1 ratio    [provider]  oxyCODONE (OXY IR/ROXICODONE) 5 MG immediate release tablet  12/27/18   [provider]    potassium chloride (KLOR-CON) 10 MEQ tablet Take 10 mEq  by mouth daily. 12/09/18   [provider]  predniSONE (DELTASONE) 10 MG tablet Take 10 mg by mouth daily. 11/28/18   [provider]  predniSONE (DELTASONE) 5 MG tablet Take 10 mg by mouth daily with breakfast. May take a second 10 mg dose as needed for illness    [provider]  SENEXON-S 8.6-50 MG tablet  12/27/18   [provider]    Physical Exam: Vitals:   02/15/19 1307 02/15/19 1330 02/15/19 1400 02/15/19 1439  BP: 128/65 (!) 138/92 (!) 142/77 (!) 125/93  Pulse: 77 75 96 85  Resp: (!) 21 12 (!) 29 18  Temp:    97.8 F (36.6 C)  TempSrc:    Oral  SpO2: 92% 95% 95% 95%  Weight:      Height:        Constitutional: NAD, calm, comfortable Eyes: PERRL, lids and conjunctivae normal ENMT: Mucous membranes are moist. Posterior pharynx clear of any exudate or lesions. Neck: normal, supple, no masses, no thyromegaly Respiratory: Clear to auscultation bilaterally, no wheezing, no crackles. Normal respiratory effort. No accessory muscle use.  Cardiovascular: Regular rate and rhythm, no murmurs / rubs / gallops. No extremity edema. 2+ pedal pulses. No carotid bruits.  Abdomen: Nondistended.  BS positive.  Soft, no tenderness, no masses palpated. No hepatosplenomegaly. Musculoskeletal: no clubbing / cyanosis. Good ROM, no contractures. Normal muscle tone.  Skin: no rashes, lesions, ulcers on limited dermatological examination. Neurologic: CN 2-12 grossly intact. Sensation intact, DTR normal. Strength 5/5 in all 4.  Psychiatric: Normal judgment and insight. Alert and oriented x 3. Normal mood.   Labs on Admission: I have personally reviewed following labs and imaging studies  CBC: Recent Labs  Lab 02/15/19 1142 02/15/19 1256  WBC 7.3  --   NEUTROABS 5.0  --   HGB 13.1 10.2*  HCT 41.0 30.0*  MCV 93.8  --   PLT PLATELET CLUMPS NOTED ON SMEAR, UNABLE TO ESTIMATE  --    Basic Metabolic  Panel: Recent Labs  Lab 02/15/19 1142 02/15/19 1256  NA 141 146*  K 3.5 2.6*  CL 104 112*  CO2 27  --   GLUCOSE 107* 85  BUN 10 7  CREATININE 0.68 0.40*  CALCIUM 9.3  --   MG 2.0  --   PHOS 4.1  --    GFR: Estimated Creatinine Clearance: 83.8 mL/min (A) (by C-G formula based on SCr of 0.4 mg/dL (L)). Liver Function Tests: No results for input(s): AST, ALT, ALKPHOS, BILITOT, PROT, ALBUMIN in the last 168 hours. No results for input(s): LIPASE, AMYLASE in the last 168 hours. No results for input(s): AMMONIA in the last 168 hours. Coagulation Profile: No results for input(s): INR, PROTIME in the last 168 hours. Cardiac Enzymes: No results for input(s): CKTOTAL, CKMB, CKMBINDEX, TROPONINI in the last 168 hours. BNP (last 3 results) No results for input(s): PROBNP in the last 8760 hours. HbA1C: No results for input(s): HGBA1C in the last 72 hours. CBG: No results for input(s): GLUCAP in the last 168 hours. Lipid Profile: No results for input(s): CHOL, HDL, LDLCALC, TRIG, CHOLHDL, LDLDIRECT in the last 72 hours. Thyroid Function Tests: No results for input(s): TSH, T4TOTAL, FREET4, T3FREE, THYROIDAB in the last 72 hours. Anemia Panel: No results for input(s): VITAMINB12, FOLATE, FERRITIN, TIBC, IRON, RETICCTPCT in the last 72 hours. Urine analysis:    Component Value Date/Time   COLORURINE YELLOW 06/15/2014 1145   APPEARANCEUR CLEAR 06/15/2014 1145   LABSPEC 1.015 01/29/2018 1553  PHURINE 7.5 01/29/2018 1553   GLUCOSEU NEGATIVE 01/29/2018 1553   HGBUR TRACE (A) 01/29/2018 1553   BILIRUBINUR NEGATIVE 01/29/2018 1553   KETONESUR NEGATIVE 01/29/2018 1553   PROTEINUR NEGATIVE 01/29/2018 1553   UROBILINOGEN 0.2 01/29/2018 1553   NITRITE NEGATIVE 01/29/2018 1553   LEUKOCYTESUR NEGATIVE 01/29/2018 1553    Radiological Exams on Admission: DG Chest 2 View  Result Date: 02/15/2019 CLINICAL DATA:  Chest pain EXAM: CHEST - 2 VIEW COMPARISON:  December 17, 2014 FINDINGS: Lungs  are clear. Heart size and pulmonary vascularity are normal. No adenopathy. There is postoperative change in the lower cervical region. No pneumothorax. IMPRESSION: Lungs clear.  Heart size normal. Electronically Signed   By: Bretta Bang III M.D.   On: 02/15/2019 12:12    EKG: Independently reviewed. Vent. rate 92 BPM PR interval * ms QRS duration 127 ms QT/QTc 397/492 ms P-R-T axes 73 -61 2 Sinus rhythm Short PR interval Nonspecific IVCD with LAD Consider anterior infarct Baseline wander in lead(s) II III aVF V6  Assessment/Plan Principal Problem:   Hypokalemia Observation/telemetry. Received oral potassium replacement. Continue IV hydration with potassium supplementation. Follow-up potassium level.  Active Problems:   Chest pain History of mild MI due to immunosuppressive therapy. Continue cardiac telemetry. Troponin has been negative. Check echocardiogram.    Glucose intolerance (impaired glucose tolerance) Carbohydrate modified diet. CBG monitoring before meals and bedtime.    Acid reflux Continue PPI.    Behcet's syndrome (HCC) Continue colchicine.   DVT prophylaxis: Lovenox SQ. Code Status: Full code. Family Communication: Disposition Plan: Observation for IV hydration and electrolyte replacement. Consults called: Admission status: Observation/telemetry.   Bobette Mo MD [facility]  If 7PM-7AM, please contact night-coverage [email or web address]  02/15/2019, 3:19 PM   This document was prepared using Dragon voice recognition software and may contain some unintended transcription errors.

## 2019-02-15 NOTE — ED Notes (Signed)
ED Provider at bedside. 

## 2019-02-15 NOTE — ED Provider Notes (Signed)
[facility]   CSN: [id number] Arrival date & time: 02/15/19  1115     History Chief Complaint  Patient presents with  . Chest Pain    [name] is a 59 y.o. female with past medical history significant for recent L4/5 XLIF and L4-S1 laminectomy and revision of fusion at [facility], adrenal insufficiency, GERD, MI, IBS, CVA, sickle cell trait, Behcet's syndrome presents to emergency department today with chief complaint of acute onset of chest pain.  She states the chest pain started about 30 minutes prior to arrival.  It is located under her left breast and she describes it as a cramping sensation.  No radiation of pain.  She did say she was short of breath when walking because of the pain but when she sits down and rests she denies any dyspnea.  She went to her PCP today and for a follow-up visit from her [facility] ED visit and was found to have a potassium of 2.7 and ionized calcium of 0.94. She was sent to emergency department for further evaluation. Of note, patient receives remicade infusions and is on chronic glucocorticoid therapy.  She is also endorsing chronic abdominal pain and diarrhea that is unchanged today. She denies fever, chills, congestion, cough, syncope, palpitations, lower extremity edema, back pain, diaphoresis, radiation of cp to arms or jaw, nausea, vomiting, urinary symptoms.  I viewed an endocrinology note from [facility] in 02/09/2019.  Patient had a lumbar fusion in December 2020 at [facility].  Up until the surgery her calcium had been normal.  She was found to be hypocalcemic postop.  With her albumin being low the corrected calcium for albumin is only slightly low.  She was started on Calcitriol.  She had a cath in 2015 that was normal, no stents placed.  She states multiple family members have died of cardiac disease however her parents or siblings have not had MI or cardiac events under the age of 22.  Well-healed surgical  scar over the lumbar spine with moderate tenderness to palpation on the lower lumbar spine but no obvious signs of leakage, erythema, drainage, or deformity   Past Medical History:  Diagnosis Date  . Acid reflux   . Adrenal insufficiency (HCC)   . Anxiety    when she gets sick she gets anxious  . Arthritis    inflammatory arthritis  . Asthma   . Behcet's syndrome (HCC)   . Chronic back pain   . Chronic neck pain   . Colitis   . Depression   . Dysrhythmia    POTS syndrome  . Foot drop    due to muscle weakness from immunosuppressants  . GERD (gastroesophageal reflux disease)   . Gluten intolerance   . IBS (irritable bowel syndrome)   . Myocardial infarction (HCC)    per pt, mild mi due to immunosuppressant therapy  . Nasal ulcer    gets occasionally from medications  . Osteoporosis    T score reported -2.7  . Pneumonia    20 years ago  . PONV (postoperative nausea and vomiting)    also low blood pressure at times. do not use lactated ringers. must have sugar with saline d/t adrenal insufficiency  . Refusal of blood transfusions as patient is Jehovah's Witness   . Renal cyst 2019  . Sickle cell trait (HCC)   . Stroke [facility]) 2014   small peripheral stroke. Mononeuritis multiplex  . Subjective visual disturbance 07/14/2012  . Superficial phlebitis and  thrombophlebitis of both lower extremities 2019  . Tear of lateral meniscus of right knee, current    Tear of lateral & medial meniscus of right knee  . Tinnitus    left ear sees Dr Ezzard Standing (ENT)  . Uveitis   . Visual changes 2019   left eye vision only with distance, right eye has floater. vascular changes    Patient Active Problem List   Diagnosis Date Noted  . Iatrogenic adrenal insufficiency (HCC) 01/15/2018  . Orthostatic hypotension 12/07/2013  . Adrenal insufficiency (HCC) 09/25/2013  . Palpitations 09/16/2013  . Cervical stenosis of spinal canal 08/22/2013  . Lumbar degenerative disc disease 12/30/2012  . Tear  of lateral meniscus of right knee, current   . Osteopenia   . Lumbar back pain 09/13/2012  . Subjective visual disturbance 07/14/2012  . Endometriosis   . Endometrial polyp   . IBS (irritable bowel syndrome)   . Acid reflux   . Colitis   . Behcet's syndrome (HCC)   . Glucose intolerance (impaired glucose tolerance) 12/19/2010  . Behcet's disease (HCC) 01/06/2004    Past Surgical History:  Procedure Laterality Date  . ANTERIOR CERVICAL DECOMP/DISCECTOMY FUSION N/A 08/22/2013   Procedure: ANTERIOR CERVICAL DECOMPRESSION/DISCECTOMY FUSION 2 LEVELS;  Surgeon: Karn Cassis, MD;  Location: [facility];  Service: Neurosurgery;  Laterality: N/A;  C4-5 C5-6 Anterior cervical decompression/diskectomy/fusion  . ANTERIOR FUSION CERVICAL SPINE  08/22/2013   c4 5  6         . BACK SURGERY    . BREAST EXCISIONAL BIOPSY Right 1986  . BREAST SURGERY     Cysts excised  . CARDIAC CATHETERIZATION  09/18/2013  . CESAREAN SECTION  03/2001   x 1  . CHOLECYSTECTOMY  12/2004  . COLONOSCOPY  03/2013  . DILATION AND CURETTAGE OF UTERUS    . EXCISION OF BREAST LESION Right 08/18/2017   Procedure: EXCISION OF RIGHT AXILLARY SOFT TISSUE MASS;  Surgeon: Carolan Shiver, MD;  Location: [facility] ORS;  Service: General;  Laterality: Right;  . HAND SURGERY Right 07/2001   dupytren  . HYSTEROSCOPY    . IR SI JOINT INJ / ARTH LEFT W/IMAG GUIDE  2018   si fusion , left   . IR SI JOINT INJ / ARTH RIGHT W/IMAG GUIDE Right 2018  . JOINT REPLACEMENT Right 11/2016   partial knee replacement  . KNEE ARTHROSCOPY WITH LATERAL MENISECTOMY Right 12/12/2012   Procedure: KNEE ARTHROSCOPY WITH LATERAL MENISECTOMY;  Surgeon: Nilda Simmer, MD;  Location: [facility];  Service: Orthopedics;  Laterality: Right;  . KNEE SURGERY Left 05/1998  . LEFT HEART CATHETERIZATION WITH CORONARY ANGIOGRAM N/A 09/18/2013   Procedure: LEFT HEART CATHETERIZATION WITH CORONARY ANGIOGRAM;  Surgeon: Micheline Chapman, MD;   Location: [facility] CATH LAB;  Service: Cardiovascular;  Laterality: N/A;  . OOPHORECTOMY     BSO  . PELVIC LAPAROSCOPY     DL  . THYROID CYST EXCISION  1983   thyroglossal duct cyst  . VAGINAL HYSTERECTOMY  07/2007   LAVH BSO     OB History    Gravida  3   Para  1   Term  1   Preterm      AB  2   Living  1     SAB      TAB      Ectopic      Multiple      Live Births  Family History  Problem Relation Age of Onset  . Diabetes Mother   . Breast cancer Mother        Age 6's  . Stroke Mother   . Gastric cancer Father   . Diabetes Sister   . Hypertension Sister   . Breast cancer Sister        Age 24    Social History   Tobacco Use  . Smoking status: Never Smoker  . Smokeless tobacco: Never Used  Substance Use Topics  . Alcohol use: No    Alcohol/week: 0.0 standard drinks  . Drug use: No    Home Medications Prior to Admission medications   Medication Sig Start Date End Date Taking? Authorizing Provider  acetaminophen (TYLENOL) 500 MG tablet Take 500 mg by mouth 3 (three) times daily.     [provider]  albuterol (PROVENTIL HFA;VENTOLIN HFA) 108 (90 BASE) MCG/ACT inhaler Inhale 1 puff into the lungs every 6 (six) hours as needed for wheezing or shortness of breath.     [provider]  Aloe-Sodium Chloride (AYR SALINE NASAL GEL NA) Place 1 spray into the nose daily as needed (congestion).    [provider]  aspirin EC 325 MG tablet Take 325 mg by mouth 2 (two) times daily.     [provider]  Calcium-Magnesium-Zinc (CAL-MAG-ZINC PO) Take 1 tablet by mouth 3 (three) times daily.    [provider]  cholecalciferol (VITAMIN D) 1000 UNITS tablet Take 1,000 Units by mouth daily at 2 PM.     [provider]  colchicine 0.6 MG tablet Take 0.6 mg by mouth 2 (two) times daily. Colcrys 12/19/10   Moses Manners, MD  diphenhydrAMINE (BENADRYL) 25 mg capsule Take 25 mg by mouth at bedtime.      [provider]  docusate sodium (STOOL SOFTENER) 100 MG capsule Take 100 mg by mouth at bedtime.     [provider]  DULoxetine (CYMBALTA) 60 MG capsule Take 60 mg by mouth daily.    [provider]  fluocinonide cream (LIDEX) 0.05 % Apply 1 application topically daily as needed (rash).  05/07/15   [provider]  fluticasone (FLONASE) 50 MCG/ACT nasal spray Place 1 spray into the nose daily.     [provider]  folic acid (FOLVITE) 1 MG tablet Take 2 mg by mouth daily at 2 PM.     [provider]  gabapentin (NEURONTIN) 100 MG capsule Take 100 mg by mouth 3 (three) times daily. 11/15/18   [provider]  ibuprofen (ADVIL,MOTRIN) 200 MG tablet Take 200 mg by mouth at bedtime. May take an additional 200 mg dose during the day as needed for pain    [provider]  inFLIXimab (REMICADE) 100 MG injection Inject 700 mg into the vein every 6 (six) weeks.     [provider]  lansoprazole (PREVACID) 30 MG capsule Take 30 mg by mouth at bedtime.    [provider]  lipase/protease/amylase (CREON) 12000 units CPEP capsule  11/03/18   [provider]  metoprolol succinate (TOPROL-XL) 50 MG 24 hr tablet TAKE 1 TABLET BY MOUTH ONCE DAILY. TAKE WITH OR IMMEDIATELY FOLLOWING A MEAL 02/17/18   Croitoru, Mihai, MD  Oral Electrolytes (THERMOTABS PO) Take 2 tablets by mouth daily. May take an additional 2 more 2 tablet doses as needed for physical activity or heat    [provider]  OVER THE COUNTER MEDICATION Place 1 drop into both eyes  as needed (dry eyes). Crocodile tears otc eye drops    [provider]  OVER THE COUNTER MEDICATION Swish and swallow 15 mLs 2 (two) times daily as needed (ulcers). Maalox mixed with liquid children's benadryl 3:1 ratio    [provider]  predniSONE (DELTASONE) 5 MG tablet Take 10 mg by mouth daily with breakfast. May take a second 10 mg dose as needed for  illness    [provider]    Allergies    Celebrex [celecoxib], Fentanyl, Latex, Sulfa drugs cross reactors, Sulfites, Simponi [golimumab], Adhesive [tape], Codeine, Epinephrine, Erythromycin, Loratadine, and Morphine and related  Review of Systems   Review of Systems  All other systems are reviewed and are negative for acute change except as noted in the HPI.   Physical Exam Updated Vital Signs Pulse 95   Temp 98.3 F (36.8 C) (Oral)   Resp (!) 24   Ht  (1.651 m)   Wt 83.5 kg   SpO2 100%   BMI 30.62 kg/m   Physical Exam Vitals and nursing note reviewed.  Constitutional:      General: She is not in acute distress.    Appearance: She is not ill-appearing.  HENT:     Head: Normocephalic and atraumatic.     Right Ear: Tympanic membrane and external ear normal.     Left Ear: Tympanic membrane and external ear normal.     Nose: Nose normal.     Mouth/Throat:     Mouth: Mucous membranes are moist.     Pharynx: Oropharynx is clear.  Eyes:     General: No scleral icterus.       Right eye: No discharge.        Left eye: No discharge.     Extraocular Movements: Extraocular movements intact.     Conjunctiva/sclera: Conjunctivae normal.     Pupils: Pupils are equal, round, and reactive to light.  Neck:     Vascular: No JVD.  Cardiovascular:     Rate and Rhythm: Normal rate and regular rhythm.     Pulses: Normal pulses.          Radial pulses are 2+ on the right side and 2+ on the left side.     Heart sounds: Normal heart sounds.  Pulmonary:     Comments: Lungs clear to auscultation in all fields. Symmetric chest rise. No wheezing, rales, or rhonchi. Chest:     Chest wall: No tenderness.  Abdominal:     Comments: Abdomen is soft, non-distended.  Mild tenderness to palpation of left upper and lower quadrants.  No rigidity, no guarding. No peritoneal signs.  Normoactive bowel sounds.  Musculoskeletal:        General: Normal range of motion.     Cervical back:  Normal range of motion.     Right lower leg: No edema.     Left lower leg: No edema.  Skin:    General: Skin is warm and dry.     Capillary Refill: Capillary refill takes less than 2 seconds.     Findings: No rash.  Neurological:     Mental Status: She is oriented to person, place, and time.     GCS: GCS eye subscore is 4. GCS verbal subscore is 5. GCS motor subscore is 6.     Comments: Fluent speech, no facial droop.  Psychiatric:        Behavior: Behavior normal.       ED Results / Procedures /  Treatments   Labs (all labs ordered are listed, but only abnormal results are displayed) Labs Reviewed  BASIC METABOLIC PANEL - Abnormal; Notable for the following components:      Result Value   Glucose, Bld 107 (*)    All other components within normal limits  I-STAT CHEM 8, ED - Abnormal; Notable for the following components:   Sodium 146 (*)    Potassium 2.6 (*)    Chloride 112 (*)    Creatinine, Ser 0.40 (*)    Calcium, Ion 0.79 (*)    Hemoglobin 10.2 (*)    HCT 30.0 (*)    All other components within normal limits  SARS CORONAVIRUS 2 (TAT 6-24 HRS)  CBC  DIFFERENTIAL  I-STAT BETA HCG BLOOD, ED ([facility], [facility], [facility] ONLY)  TROPONIN I (HIGH SENSITIVITY)  TROPONIN I (HIGH SENSITIVITY)    EKG EKG Interpretation  Date/Time:  Wednesday February 15 2019 11:25:59 EST Ventricular Rate:  92 PR Interval:    QRS Duration: 127 QT Interval:  397 QTC Calculation: 492 R Axis:   -61 Text Interpretation: Sinus rhythm Short PR interval Nonspecific IVCD with LAD Consider anterior infarct Baseline wander in lead(s) II III aVF V6 Abnormal ECG Confirmed by Carmin Muskrat [phone]) on 02/15/2019 11:53:26 AM   Radiology DG Chest 2 View  Result Date: 02/15/2019 CLINICAL DATA:  Chest pain EXAM: CHEST - 2 VIEW COMPARISON:  December 17, 2014 FINDINGS: Lungs are clear. Heart size and pulmonary vascularity are normal. No adenopathy. There is postoperative change in the lower cervical region. No  pneumothorax. IMPRESSION: Lungs clear.  Heart size normal. Electronically Signed   By: Lowella Grip III M.D.   On: 02/15/2019 12:12    Procedures Procedures (including critical care time)  Medications Ordered in ED Medications  sodium chloride flush (NS) 0.9 % injection 3 mL (0 mLs Intravenous Hold 02/15/19 1145)  potassium chloride 10 mEq in 100 mL IVPB (has no administration in time range)  potassium chloride SA (KLOR-CON) CR tablet 40 mEq (has no administration in time range)  calcium gluconate 1 g in sodium chloride 0.9 % 100 mL IVPB (has no administration in time range)  ziprasidone (GEODON) injection 20 mg (20 mg Intramuscular Given 02/15/19 1239)  sterile water (preservative free) injection (10 mLs  Given 02/15/19 1239)    ED Course  I have reviewed the triage vital signs and the nursing notes.  Pertinent labs & imaging results that were available during my care of the patient were reviewed by me and considered in my medical decision making (see chart for details).    MDM Rules/Calculators/A&P                      Patient seen and examined. Patient presents awake, alert, hemodynamically stable, afebrile, non toxic.  On exam she is well-appearing.  Lungs are clear to auscultation all fields.  Chest pain is not reproducible.  She has abdominal tenderness to left upper and lower quadrants.  No peritoneal signs, no rigidity or guarding.  Normoactive bowel sounds.  She states her abdominal pain is unchanged from her recent ED visit at [facility]. I viewed recent ED visit at [facility] on 02/07/2019. She had the same left upper and lower abdominal drain abdominal pain that she is having today.  She had a CT that showed a postop fluid collection near her spine thought to be a benign seroma as well as mild thickening of the sigmoid colon without diverticulitis.  Do not feel emergent  CT is needed again today and her pain is the same and unchanged from previous.  First troponin undetectable, second  troponin not yet collected. CBC without leukocytosis, no anemia, platelets were noted to be clumped, likely consistent with her sickle cell trait. BMP is unremarkable, no severe electrolyte derangements or renal insufficiency.  Ever on i-STAT she was found to be hypokalemic to 2.6, hypocalcemic with a ionized calcium of 0.79.  Hemoglobin was noted to be 10.2.  viewed pt's chest xray and it does not suggest acute infectious processes. EKG sinus rhythm, short PR, no STEMI.  Will replete potassium with IV and p.o.  I have ordered 40 mg p.o. as well as 2 runs IV.  Also ordered IV calcium gluconate for hypocalcemia. This case was discussed with Dr. Jeraldine Loots who has seen the patient and agrees with plan to admit. Spoke with Dr. Robb Matar  with hospitalist service who agrees to assume care of patient and bring into the hospital for further evaluation and management.     Portions of this note were generated with Scientist, clinical (histocompatibility and immunogenetics). Dictation errors may occur despite best attempts at proofreading.   Final Clinical Impression(s) / ED Diagnoses Final diagnoses:  Hypokalemia  Hypocalcemia  Atypical chest pain    Rx / DC Orders ED Discharge Orders    None       Kathyrn Lass 02/15/19 1426    Gerhard Munch, MD 02/16/19 1947

## 2019-02-15 NOTE — ED Triage Notes (Signed)
2 weeks ago low K+ and Ca. Resulting in chest pain, labs obtained today with K+ 2.7 ICa 0.94. chest pain times 20 min.

## 2019-02-15 NOTE — ED Notes (Signed)
ED TO INPATIENT HANDOFF REPORT  ED Nurse Name and Phone #:   S Name/Age/Gender [name] 59 y.o. female Room/Bed: WA25/WA25  Code Status   Code Status: Prior  Home/SNF/Other Home Patient oriented to: self, place, time and situation Is this baseline? Yes   Triage Complete: Triage complete  Chief Complaint Hypokalemia [E87.6]  Triage Note 2 weeks ago low K+ and Ca. Resulting in chest pain, labs obtained today with K+ 2.7 ICa 0.94. chest pain times 20 min.    Allergies Allergies  Allergen Reactions  . Celebrex [Celecoxib] Other (See Comments)    Bleeding/bruising including bleeding for multiple days This is a reaction d/t sulfa product  . Fentanyl Nausea And Vomiting    Had mild heart attack after last surgery along with vomiting severely. Does NOT want fentanyl at all.  Versed and valium are okay  . Latex Shortness Of Breath and Dermatitis  . Sulfa Drugs Cross Reactors Other (See Comments)    Eyes turn red, renal failure, stomach problems (vomiting, diarrhea), vasculature collapses, migraine like symptoms  . Sulfites Other (See Comments)    Eyes turn red, renal failure, stomach problems (vomiting, diarrhea), vasculature collapses, migraine like symptoms  . Simponi [Golimumab] Swelling    Simponi Aria- Headache, blurred vision. (similar to remicade, but uses human dna)  . Adhesive [Tape] Other (See Comments)    Pulls skin right off.  PAPER TAPE is okay. tegaderm is also okay  . Codeine Itching and Nausea And Vomiting    Tolerates hydrocodone   . Epinephrine Other (See Comments)    Tachycardia (this is a normal side effect of this medication)  . Erythromycin Itching and Rash    Has tolerated azithromycin  . Loratadine Itching  . Morphine And Related Itching and Rash    Level of Care/Admitting Diagnosis ED Disposition    ED Disposition Condition Comment   Admit  Hospital Area: [facility] [[id number]]  Level of Care: Telemetry  [5]  Admit to tele based on following criteria: Monitor for Ischemic changes  Covid Evaluation: Asymptomatic Screening Protocol (No Symptoms)  Diagnosis: Hypokalemia [622633]  Admitting Physician: Bobette Mo [[id number]]  Attending Physician: Bobette Mo [[id number]]       B Medical/Surgery History Past Medical History:  Diagnosis Date  . Acid reflux   . Adrenal insufficiency (HCC)   . Anxiety    when she gets sick she gets anxious  . Arthritis    inflammatory arthritis  . Asthma   . Behcet's syndrome (HCC)   . Chronic back pain   . Chronic neck pain   . Colitis   . Depression   . Dysrhythmia    POTS syndrome  . Foot drop    due to muscle weakness from immunosuppressants  . GERD (gastroesophageal reflux disease)   . Gluten intolerance   . IBS (irritable bowel syndrome)   . Myocardial infarction (HCC)    per pt, mild mi due to immunosuppressant therapy  . Nasal ulcer    gets occasionally from medications  . Osteoporosis    T score reported -2.7  . Pneumonia    20 years ago  . PONV (postoperative nausea and vomiting)    also low blood pressure at times. do not use lactated ringers. must have sugar with saline d/t adrenal insufficiency  . Refusal of blood transfusions as patient is Jehovah's Witness   . Renal cyst 2019  . Sickle cell trait (HCC)   . Stroke [facility]) 2014  small peripheral stroke. Mononeuritis multiplex  . Subjective visual disturbance 07/14/2012  . Superficial phlebitis and thrombophlebitis of both lower extremities 2019  . Tear of lateral meniscus of right knee, current    Tear of lateral & medial meniscus of right knee  . Tinnitus    left ear sees Dr Ezzard Standing (ENT)  . Uveitis   . Visual changes 2019   left eye vision only with distance, right eye has floater. vascular changes   Past Surgical History:  Procedure Laterality Date  . ANTERIOR CERVICAL DECOMP/DISCECTOMY FUSION N/A 08/22/2013   Procedure: ANTERIOR CERVICAL  DECOMPRESSION/DISCECTOMY FUSION 2 LEVELS;  Surgeon: Karn Cassis, MD;  Location: [facility];  Service: Neurosurgery;  Laterality: N/A;  C4-5 C5-6 Anterior cervical decompression/diskectomy/fusion  . ANTERIOR FUSION CERVICAL SPINE  08/22/2013   c4 5  6         . BACK SURGERY    . BREAST EXCISIONAL BIOPSY Right 1986  . BREAST SURGERY     Cysts excised  . CARDIAC CATHETERIZATION  09/18/2013  . CESAREAN SECTION  03/2001   x 1  . CHOLECYSTECTOMY  12/2004  . COLONOSCOPY  03/2013  . DILATION AND CURETTAGE OF UTERUS    . EXCISION OF BREAST LESION Right 08/18/2017   Procedure: EXCISION OF RIGHT AXILLARY SOFT TISSUE MASS;  Surgeon: Carolan Shiver, MD;  Location: [facility] ORS;  Service: General;  Laterality: Right;  . HAND SURGERY Right 07/2001   dupytren  . HYSTEROSCOPY    . IR SI JOINT INJ / ARTH LEFT W/IMAG GUIDE  2018   si fusion , left   . IR SI JOINT INJ / ARTH RIGHT W/IMAG GUIDE Right 2018  . JOINT REPLACEMENT Right 11/2016   partial knee replacement  . KNEE ARTHROSCOPY WITH LATERAL MENISECTOMY Right 12/12/2012   Procedure: KNEE ARTHROSCOPY WITH LATERAL MENISECTOMY;  Surgeon: Nilda Simmer, MD;  Location: [facility];  Service: Orthopedics;  Laterality: Right;  . KNEE SURGERY Left 05/1998  . LEFT HEART CATHETERIZATION WITH CORONARY ANGIOGRAM N/A 09/18/2013   Procedure: LEFT HEART CATHETERIZATION WITH CORONARY ANGIOGRAM;  Surgeon: Micheline Chapman, MD;  Location: [facility] CATH LAB;  Service: Cardiovascular;  Laterality: N/A;  . OOPHORECTOMY     BSO  . PELVIC LAPAROSCOPY     DL  . THYROID CYST EXCISION  1983   thyroglossal duct cyst  . VAGINAL HYSTERECTOMY  07/2007   LAVH BSO     A IV Location/Drains/Wounds Patient Lines/Drains/Airways Status   Active Line/Drains/Airways    Name:   Placement date:   Placement time:   Site:   Days:   Peripheral IV 02/15/19 Right Antecubital   02/15/19    --    Antecubital   less than 1   Incision 12/30/12 Back Other (Comment)    12/30/12    1108     2238   Incision (Closed) 08/18/17 Axilla Right   08/18/17    1108     546          Intake/Output Last 24 hours No intake or output data in the 24 hours ending 02/15/19 1413  Labs/Imaging Results for orders placed or performed during the hospital encounter of 02/15/19 (from the past 48 hour(s))  Basic metabolic panel     Status: Abnormal   Collection Time: 02/15/19 11:42 AM  Result Value Ref Range   Sodium 141 135 - 145 mmol/L   Potassium 3.5 3.5 - 5.1 mmol/L   Chloride 104 98 - 111 mmol/L  CO2 27 22 - 32 mmol/L   Glucose, Bld 107 (H) 70 - 99 mg/dL   BUN 10 6 - 20 mg/dL   Creatinine, Ser 0.25 0.44 - 1.00 mg/dL   Calcium 9.3 8.9 - 42.7 mg/dL   GFR calc non Af Amer >60 >60 mL/min   GFR calc Af Amer >60 >60 mL/min   Anion gap 10 5 - 15    Comment: Performed at [facility], [address] [place], [place] [address]  CBC     Status: None   Collection Time: 02/15/19 11:42 AM  Result Value Ref Range   WBC 7.3 4.0 - 10.5 K/uL   RBC 4.37 3.87 - 5.11 MIL/uL   Hemoglobin 13.1 12.0 - 15.0 g/dL   HCT 62.8 31.5 - 17.6 %   MCV 93.8 80.0 - 100.0 fL   MCH 30.0 26.0 - 34.0 pg   MCHC 32.0 30.0 - 36.0 g/dL   RDW 16.0 73.7 - 10.6 %   Platelets PLATELET CLUMPS NOTED ON SMEAR, UNABLE TO ESTIMATE 150 - 400 K/uL   nRBC 0.0 0.0 - 0.2 %    Comment: Performed at [facility], [address] [place], [place] [address]  Troponin I (High Sensitivity)     Status: None   Collection Time: 02/15/19 11:42 AM  Result Value Ref Range   Troponin I (High Sensitivity) <2 <18 ng/L    Comment: (NOTE) Elevated high sensitivity troponin I (hsTnI) values and significant  changes across serial measurements may suggest ACS but many other  chronic and acute conditions are known to elevate hsTnI results.  Refer to the Links section for chest pain algorithms and additional  guidance. Performed at [facility], [address], [place], [place] [address]   Differential     Status: None   Collection Time: 02/15/19 11:42 AM  Result Value Ref Range   Neutrophils Relative % 68 %   Neutro Abs 5.0 1.7 - 7.7 K/uL   Lymphocytes Relative 20 %   Lymphs Abs 1.5 0.7 - 4.0 K/uL   Monocytes Relative 11 %   Monocytes Absolute 0.8 0.1 - 1.0 K/uL   Eosinophils Relative 0 %   Eosinophils Absolute 0.0 0.0 - 0.5 K/uL   Basophils Relative 1 %   Basophils Absolute 0.1 0.0 - 0.1 K/uL   Immature Granulocytes 0 %   Abs Immature Granulocytes 0.02 0.00 - 0.07 K/uL    Comment: Performed at [facility], [address], [place], [place] [address]  Magnesium     Status: None   Collection Time: 02/15/19 11:42 AM  Result Value Ref Range   Magnesium 2.0 1.7 - 2.4 mg/dL    Comment: Performed at [facility], [address], [place], [place] [address]  Phosphorus     Status: None   Collection Time: 02/15/19 11:42 AM  Result Value Ref Range   Phosphorus 4.1 2.5 - 4.6 mg/dL    Comment: Performed at [facility], [address] [place], [place] [address]  I-Stat beta hCG blood, ED     Status: None   Collection Time: 02/15/19 11:47 AM  Result Value Ref Range   I-stat hCG, quantitative <5.0 <5 mIU/mL   Comment 3            Comment:   GEST. AGE      CONC.  (mIU/mL)   <=1 WEEK        5 - 50  2 WEEKS       50 - 500     3 WEEKS       100 - 10,000     4 WEEKS     1,000 - 30,000        FEMALE AND NON-PREGNANT FEMALE:     LESS THAN 5 mIU/mL   I-stat chem 8, ED (not at [facility] or [facility])     Status: Abnormal   Collection Time: 02/15/19 12:56 PM  Result Value Ref Range   Sodium 146 (H) 135 - 145 mmol/L   Potassium 2.6 (LL) 3.5 - 5.1 mmol/L   Chloride 112 (H) 98 - 111 mmol/L   BUN 7 6 - 20 mg/dL   Creatinine, Ser 0.40 (L) 0.44 - 1.00 mg/dL   Glucose, Bld 85 70 - 99 mg/dL   Calcium, Ion 0.79 (LL) 1.15 - 1.40 mmol/L   TCO2 23 22 - 32 mmol/L   Hemoglobin 10.2 (L) 12.0 - 15.0 g/dL   HCT  30.0 (L) 36.0 - 46.0 %   Comment NOTIFIED PHYSICIAN    DG Chest 2 View  Result Date: 02/15/2019 CLINICAL DATA:  Chest pain EXAM: CHEST - 2 VIEW COMPARISON:  December 17, 2014 FINDINGS: Lungs are clear. Heart size and pulmonary vascularity are normal. No adenopathy. There is postoperative change in the lower cervical region. No pneumothorax. IMPRESSION: Lungs clear.  Heart size normal. Electronically Signed   By: Lowella Grip III M.D.   On: 02/15/2019 12:12    Pending Labs Unresulted Labs (From admission, onward)    Start     Ordered   02/15/19 1315  SARS CORONAVIRUS 2 (TAT 6-24 HRS) Nasopharyngeal Nasopharyngeal Swab  (Tier 3 (TAT 6-24 hrs))  Once,   STAT    Question Answer Comment  Is this test for diagnosis or screening Screening   Symptomatic for COVID-19 as defined by CDC No   Hospitalized for COVID-19 No   Admitted to ICU for COVID-19 No   Previously tested for COVID-19 No   Resident in a congregate (group) care setting No   Employed in healthcare setting No   Pregnant No      02/15/19 1314          Vitals/Pain Today's Vitals   02/15/19 1215 02/15/19 1307 02/15/19 1330 02/15/19 1400  BP: (!) 121/97 128/65 (!) 138/92 (!) 142/77  Pulse: 84 77 75 96  Resp: 16 (!) 21 12 (!) 29  Temp:      TempSrc:      SpO2: 97% 92% 95% 95%  Weight:      Height:      PainSc:        Isolation Precautions No active isolations  Medications Medications  sodium chloride flush (NS) 0.9 % injection 3 mL (0 mLs Intravenous Hold 02/15/19 1145)  potassium chloride 10 mEq in 100 mL IVPB (10 mEq Intravenous New Bag/Given 02/15/19 1326)  calcium gluconate 1 g/ 50 mL sodium chloride IVPB (has no administration in time range)  ziprasidone (GEODON) injection 20 mg (20 mg Intramuscular Given 02/15/19 1239)  sterile water (preservative free) injection (10 mLs  Given 02/15/19 1239)  potassium chloride SA (KLOR-CON) CR tablet 40 mEq (40 mEq Oral Given 02/15/19 1326)    Mobility walks Low fall  risk   Focused Assessments    R Recommendations: See Admitting Provider Note  Report given to:   Additional Notes:

## 2019-02-16 ENCOUNTER — Observation Stay (HOSPITAL_BASED_OUTPATIENT_CLINIC_OR_DEPARTMENT_OTHER): Payer: Medicare Other

## 2019-02-16 DIAGNOSIS — M352 Behcet's disease: Secondary | ICD-10-CM

## 2019-02-16 DIAGNOSIS — R079 Chest pain, unspecified: Secondary | ICD-10-CM | POA: Diagnosis not present

## 2019-02-16 DIAGNOSIS — K219 Gastro-esophageal reflux disease without esophagitis: Secondary | ICD-10-CM

## 2019-02-16 DIAGNOSIS — E876 Hypokalemia: Secondary | ICD-10-CM

## 2019-02-16 DIAGNOSIS — R0789 Other chest pain: Secondary | ICD-10-CM

## 2019-02-16 LAB — BASIC METABOLIC PANEL
Anion gap: 9 (ref 5–15)
Anion gap: 8 (ref 5–15)
BUN: 10 mg/dL (ref 6–20)
BUN: 13 mg/dL (ref 6–20)
CO2: 25 mmol/L (ref 22–32)
CO2: 28 mmol/L (ref 22–32)
Calcium: 8.4 mg/dL — ABNORMAL LOW (ref 8.9–10.3)
Calcium: 9.2 mg/dL (ref 8.9–10.3)
Chloride: 111 mmol/L (ref 98–111)
Chloride: 107 mmol/L (ref 98–111)
Creatinine, Ser: 0.51 mg/dL (ref 0.44–1.00)
Creatinine, Ser: 0.61 mg/dL (ref 0.44–1.00)
GFR calc Af Amer: >60 mL/min (ref >60)
GFR calc Af Amer: >60 mL/min (ref >60)
GFR calc non Af Amer: >60 mL/min (ref >60)
GFR calc non Af Amer: >60 mL/min (ref >60)
Glucose, Bld: 82 mg/dL (ref 70–99)
Glucose, Bld: 116 mg/dL — ABNORMAL HIGH (ref 70–99)
Potassium: 3.8 mmol/L (ref 3.5–5.1)
Potassium: 3.7 mmol/L (ref 3.5–5.1)
Sodium: 145 mmol/L (ref 135–145)
Sodium: 143 mmol/L (ref 135–145)

## 2019-02-16 LAB — GLUCOSE, CAPILLARY
Glucose-Capillary: 79 mg/dL (ref 70–99)
Glucose-Capillary: 101 mg/dL — ABNORMAL HIGH (ref 70–99)
Glucose-Capillary: 129 mg/dL — ABNORMAL HIGH (ref 70–99)

## 2019-02-16 LAB — MAGNESIUM: Magnesium: 1.9 mg/dL (ref 1.7–2.4)

## 2019-02-16 LAB — ECHOCARDIOGRAM COMPLETE
Height: 65 in
Weight: 2944 oz

## 2019-02-16 LAB — HIV ANTIBODY (ROUTINE TESTING W REFLEX): HIV Screen 4th Generation wRfx: NONREACTIVE

## 2019-02-16 MED ORDER — PANCRELIPASE (LIP-PROT-AMYL) 12000-38000 UNITS PO CPEP
36000.0000 [IU] | ORAL_CAPSULE | Freq: Three times a day (TID) | ORAL | Status: DC
  Administered 2019-02-17: 36000 [IU] via ORAL
  Filled 2019-02-16: qty 3

## 2019-02-16 MED ORDER — SODIUM CHLORIDE 0.9 % IV SOLN: INTRAVENOUS | Status: DC

## 2019-02-16 MED ORDER — PREDNISONE 20 MG PO TABS
40.0000 mg | ORAL_TABLET | Freq: Once | ORAL | Status: AC
  Administered 2019-02-16: 40 mg via ORAL
  Filled 2019-02-16: qty 2

## 2019-02-16 MED ORDER — PREDNISONE 10 MG PO TABS: 60.0000 mg | ORAL_TABLET | Freq: Every day | ORAL | Status: DC

## 2019-02-16 MED ORDER — PREDNISONE 50 MG PO TABS
60.0000 mg | ORAL_TABLET | Freq: Every day | ORAL | Status: DC
  Administered 2019-02-17: 60 mg via ORAL
  Filled 2019-02-16: qty 1

## 2019-02-16 MED ORDER — POTASSIUM CHLORIDE CRYS ER 20 MEQ PO TBCR
40.0000 meq | EXTENDED_RELEASE_TABLET | Freq: Once | ORAL | Status: AC
  Administered 2019-02-16: 21:00:00 40 meq via ORAL
  Filled 2019-02-16: qty 2

## 2019-02-16 MED ORDER — POTASSIUM CHLORIDE CRYS ER 10 MEQ PO TBCR: 20.0000 meq | EXTENDED_RELEASE_TABLET | Freq: Three times a day (TID) | ORAL | Status: DC

## 2019-02-16 MED ORDER — LOPERAMIDE HCL 2 MG PO CAPS
2.0000 mg | ORAL_CAPSULE | ORAL | Status: DC | PRN
  Administered 2019-02-16 – 2019-02-17 (×3): 2 mg via ORAL
  Filled 2019-02-16 (×4): qty 1

## 2019-02-16 MED ORDER — LOPERAMIDE HCL 2 MG PO CAPS: 2.0000 mg | ORAL_CAPSULE | ORAL | 0 refills | Status: AC | PRN

## 2019-02-16 NOTE — Progress Notes (Signed)
  Echocardiogram 2D Echocardiogram has been performed.  [name] 02/16/2019, 9:41 AM

## 2019-02-16 NOTE — Care Management Obs Status (Signed)
MEDICARE OBSERVATION STATUS NOTIFICATION   Patient Details  Name: [name] MRN: [id number] Date of Birth: April 16, 1960   Medicare Observation Status Notification Given:  Yes    Ida Rogue, LCSW 02/16/2019, 3:32 PM

## 2019-02-16 NOTE — Progress Notes (Signed)
PROGRESS NOTE    [name]  ION:[id number] DOB: [birth year] DOA: 02/15/2019 PCP: Evalee Mutton, MD    Brief Narrative:  59 year old lady with prior h/o GERD, adrenal insufficiency, asthma, Behcet disease. IBS, POTS syndrome, gluten intolrence, IBS, osteoporosis, sickle cell trait, h/o of CVA, superficial thrombophlebitis, comes in for sub sternal chest pain . She also reports persistent diarrhea, nausea, in addition to the chest pain.  On admission, she was found to be severely hypokalemic. Troponin is negative.  EKG shows NSR.  She was admitted for further evaluation.   Assessment & Plan:   Principal Problem:   Hypokalemia Active Problems:   Glucose intolerance (impaired glucose tolerance)   Acid reflux   Behcet's syndrome (HCC)   Chest pain  Atypical chest pain:  Resolved. Probably from hypokalemia induced muscle cramps.  Echocardiogram reviewed, unremarkable.  Troponin is negative.  EKG shows NSR.   Hypokalemia:  Replaced.    Behcets disease: - resume 60 mg of prednisone.    GERD; Stable.    DVT prophylaxis: lovenox.  Code Status: full code.  Family Communication: none at bedside.  Disposition Plan:   Patient came from: Home.             Anticipated d/c place:HOme.  Barriers to d/c OR conditions which need to be met to effect a safe d/c:persistent diarrhea.    Consultants:   Curb side with GI on call.    Procedures: Echocardiogram.    Antimicrobials: none.    Subjective: Continues to have diarrhea, have a headache, feels weak.  Objective: Vitals:   02/15/19 1439 02/15/19 2046 02/16/19 0602 02/16/19 1444  BP: (!) 125/93 105/68 126/80 137/72  Pulse: 85 78 74 81  Resp: _0 Temp: 97.8 F (36.6 C) 97.9 F (36.6 C) 98 F (36.7 C) 98.6 F (37 C)  TempSrc: Oral Oral  Oral  SpO2: 95% 97% 98% 97%  Weight:      Height:       No intake or output data in the 24 hours ending 02/16/19 1917 Filed Weights   02/15/19  1127  Weight: 83.5 kg    Examination:  General exam: Appears calm and comfortable  Respiratory system: Clear to auscultation. Respiratory effort normal. Cardiovascular system: S1 & S2 heard, RRR. No JVD, No pedal edema. Gastrointestinal system: Abdomen is nondistended, soft and nontender. No organomegaly or masses felt. Normal bowel sounds heard. Central nervous system: Alert and oriented. No focal neurological deficits. Extremities: Symmetric 5 x 5 power. Skin: No rashes, lesions or ulcers Psychiatry:Mood & affect appropriate.     Data Reviewed: I have personally reviewed following labs and imaging studies  CBC: Recent Labs  Lab 02/15/19 1142 02/15/19 1256  WBC 7.3  --   NEUTROABS 5.0  --   HGB 13.1 10.2*  HCT 41.0 30.0*  MCV 93.8  --   PLT PLATELET CLUMPS NOTED ON SMEAR, UNABLE TO ESTIMATE  --    Basic Metabolic Panel: Recent Labs  Lab 02/15/19 1142 02/15/19 1256 02/15/19 1540 02/16/19 0512 02/16/19 1218  NA 141 146* 145 145 143  K 3.5 2.6* 4.4 3.8 3.7  CL 104 112* 110 111 107  CO2 27  --  _1 GLUCOSE 107* 85 139* 82 116*  BUN _2 CREATININE 0.68 0.40* 0.58 0.51 0.61  CALCIUM 9.3  --  9.2 8.4* 9.2  MG 2.0  --   --  1.9  --  PHOS 4.1  --   --   --   --    GFR: Estimated Creatinine Clearance: 83.8 mL/min (by C-G formula based on SCr of 0.61 mg/dL). Liver Function Tests: No results for input(s): AST, ALT, ALKPHOS, BILITOT, PROT, ALBUMIN in the last 168 hours. No results for input(s): LIPASE, AMYLASE in the last 168 hours. No results for input(s): AMMONIA in the last 168 hours. Coagulation Profile: No results for input(s): INR, PROTIME in the last 168 hours. Cardiac Enzymes: No results for input(s): CKTOTAL, CKMB, CKMBINDEX, TROPONINI in the last 168 hours. BNP (last 3 results) No results for input(s): PROBNP in the last 8760 hours. HbA1C: No results for input(s): HGBA1C in the last 72 hours. CBG: Recent Labs  Lab 02/15/19 1627  02/16/19 0604 02/16/19 1142 02/16/19 1645  GLUCAP 125* 79 101* 129*   Lipid Profile: No results for input(s): CHOL, HDL, LDLCALC, TRIG, CHOLHDL, LDLDIRECT in the last 72 hours. Thyroid Function Tests: No results for input(s): TSH, T4TOTAL, FREET4, T3FREE, THYROIDAB in the last 72 hours. Anemia Panel: No results for input(s): VITAMINB12, FOLATE, FERRITIN, TIBC, IRON, RETICCTPCT in the last 72 hours. Sepsis Labs: No results for input(s): PROCALCITON, LATICACIDVEN in the last 168 hours.  Recent Results (from the past 240 hour(s))  SARS CORONAVIRUS 2 (TAT 6-24 HRS) Nasopharyngeal Nasopharyngeal Swab     Status: None   Collection Time: 02/15/19  1:30 PM   Specimen: Nasopharyngeal Swab  Result Value Ref Range Status   SARS Coronavirus 2 NEGATIVE NEGATIVE Final    Comment: (NOTE) SARS-CoV-2 target nucleic acids are NOT DETECTED. The SARS-CoV-2 RNA is generally detectable in upper and lower respiratory specimens during the acute phase of infection. Negative results do not preclude SARS-CoV-2 infection, do not rule out co-infections with other pathogens, and should not be used as the sole basis for treatment or other patient management decisions. Negative results must be combined with clinical observations, patient history, and epidemiological information. The expected result is Negative. Fact Sheet for Patients: SugarRoll.be Fact Sheet for Healthcare Providers: https://www.woods-mathews.com/ This test is not yet approved or cleared by the [place] FDA and  has been authorized for detection and/or diagnosis of SARS-CoV-2 by FDA under an Emergency Use Authorization (EUA). This EUA will remain  in effect (meaning this test can be used) for the duration of the COVID-19 declaration under Section 56 4(b)(1) of the Act, 21 U.S.C. section 360bbb-3(b)(1), unless the authorization is terminated or revoked sooner. Performed at [facility] [place] [address], [place], [place] [address]          Radiology Studies: DG Chest 2 View  Result Date: 02/15/2019 CLINICAL DATA:  Chest pain EXAM: CHEST - 2 VIEW COMPARISON:  December 17, 2014 FINDINGS: Lungs are clear. Heart size and pulmonary vascularity are normal. No adenopathy. There is postoperative change in the lower cervical region. No pneumothorax. IMPRESSION: Lungs clear.  Heart size normal. Electronically Signed   By: Lowella Grip III M.D.   On: 02/15/2019 12:12   ECHOCARDIOGRAM COMPLETE  Result Date: 02/16/2019    ECHOCARDIOGRAM REPORT   Patient Name:   [name] [facility] Date of Exam: 02/16/2019 Medical Rec #:  [id number]                 Height:       65.0 in Accession #:    [id number]                Weight:       184.0  lb Date of Birth:  Apr 30, 1960                 BSA:          1.91 m Patient Age:    41 years                  BP:           126/80 mmHg Patient Gender: F                         HR:           74 bpm. Exam Location:  Inpatient Procedure: 2D Echo Indications:    chest pain  History:        Patient has prior history of Echocardiogram examinations, most                 recent 09/17/2013. Stroke. Mild MI per pt, due to                 immunosuppressant therapy.  Sonographer:    Jannett Celestine RDCS (AE) Referring Phys: [id number] [place]  Sonographer Comments: Image acquisition challenging due to respiratory motion. IMPRESSIONS  1. Left ventricular ejection fraction, by estimation, is 60 to 65%. The left ventricle has normal function. The left ventrical has no regional wall motion abnormalities. Left ventricular diastolic parameters were normal.  2. Right ventricular systolic function is normal. The right ventricular size is normal. There is normal pulmonary artery systolic pressure.  3. The mitral valve is grossly normal. no evidence of mitral valve regurgitation. No evidence of mitral stenosis.  4. The aortic valve is normal in structure and function. Aortic  valve regurgitation is trivial . No aortic stenosis is present. FINDINGS  Left Ventricle: Left ventricular ejection fraction, by estimation, is 60 to 65%. The left ventricle has normal function. The left ventricle has no regional wall motion abnormalities. There is no left ventricular hypertrophy. Left ventricular diastolic parameters were normal. Right Ventricle: The right ventricular size is normal. No increase in right ventricular wall thickness. Right ventricular systolic function is normal. There is normal pulmonary artery systolic pressure. The tricuspid regurgitant velocity is 2.51 m/s, and  with an assumed right atrial pressure of 3 mmHg, the estimated right ventricular systolic pressure is 77.4 mmHg. Left Atrium: Left atrial size was normal in size. Right Atrium: Right atrial size was normal in size. Pericardium: There is no evidence of pericardial effusion. Mitral Valve: The mitral valve is grossly normal. There is mild thickening of the mitral valve leaflet(s). There is mild calcification of the mitral valve leaflet(s). No evidence of mitral valve regurgitation. No evidence of mitral valve stenosis. Tricuspid Valve: The tricuspid valve is grossly normal. Tricuspid valve regurgitation is trivial. Aortic Valve: The aortic valve is normal in structure and function. Aortic valve regurgitation is trivial. No aortic stenosis is present. Pulmonic Valve: The pulmonic valve was grossly normal. Pulmonic valve regurgitation is not visualized. Aorta: The aortic root, ascending aorta and aortic arch are all structurally normal, with no evidence of dilitation or obstruction. IAS/Shunts: The atrial septum is grossly normal.  LEFT VENTRICLE PLAX 2D LVIDd:         4.70 cm  Diastology LVIDs:         2.90 cm  LV e' lateral:   11.20 cm/s LV PW:         0.90 cm  LV E/e' lateral: 5.9 LV IVS:  1.00 cm  LV e' medial:    10.80 cm/s LVOT diam:     1.90 cm  LV E/e' medial:  6.1 LV SV:         49.62 ml LV SV Index:   35.36  LVOT Area:     2.84 cm  RIGHT VENTRICLE RV S prime:     10.90 cm/s TAPSE (M-mode): 2.5 cm LEFT ATRIUM         Index      RIGHT ATRIUM           Index LA diam:    3.30 cm 1.73 cm/m RA Area:     15.30 cm                                RA Volume:   35.00 ml  18.33 ml/m  AORTIC VALVE LVOT Vmax:   86.80 cm/s LVOT Vmean:  59.300 cm/s LVOT VTI:    0.175 m  AORTA Ao Root diam: 2.50 cm MITRAL VALVE                        TRICUSPID VALVE MV Area (PHT): 3.77 cm             TR Peak grad:   25.2 mmHg MV Decel Time: 201 msec             TR Vmax:        251.00 cm/s MV E velocity: 65.60 cm/s 103 cm/s MV A velocity: 54.40 cm/s 70.3 cm/s SHUNTS MV E/A ratio:  1.21       1.5       Systemic VTI:  0.18 m                                     Systemic Diam: 1.90 cm Mertie Moores MD Electronically signed by Mertie Moores MD Signature Date/Time: 02/16/2019/11:36:06 AM    Final         Scheduled Meds:  calcitRIOL  0.25 mcg Oral BID   colchicine  0.6 mg Oral BID   diphenhydrAMINE  25 mg Oral QHS   docusate sodium  100 mg Oral QHS   DULoxetine  60 mg Oral Daily   enoxaparin (LOVENOX) injection  40 mg Subcutaneous [id number]   folic acid  2 mg Oral [id number]   gabapentin  300 mg Oral QHS   leflunomide  20 mg Oral Daily   lipase/protease/amylase  12,000 Units Oral TID AC   metoprolol succinate  50 mg Oral Daily   potassium chloride  20 mEq Oral Daily   sodium chloride flush  3 mL Intravenous Once   Continuous Infusions:   LOS: 0 days        Hosie Poisson, MD [facility]   To contact the attending provider between 7A-7P or the covering provider during after hours 7P-7A, please log into the web site [email or web address] and access using universal [place] password for that web site. If you do not have the password, please call the hospital operator.  02/16/2019, 7:17 PM

## 2019-02-16 NOTE — Plan of Care (Signed)

## 2019-02-16 NOTE — Discharge Summary (Signed)
Physician Discharge Summary  [name] ATF:[id number] DOB: July 06, 1960 DOA: 02/15/2019  PCP: Gardner Candle, MD  Admit date: 02/15/2019 Discharge date: 02/17/2019  Admitted From:Home  Disposition:  Home  Recommendations for Outpatient Follow-up:  1. Follow up with PCP in 1-2 weeks 2. Please obtain BMP/CBC in one week     Discharge Condition: stable. CODE STATUS: full code Diet recommendation: Heart Healthy   Brief/Interim Summary: 59 year old lady with prior h/o GERD, adrenal insufficiency, asthma, Behcet disease. IBS, POTS syndrome, gluten intolrence, IBS, osteoporosis, sickle cell trait, h/o of CVA, superficial thrombophlebitis, comes in for sub sternal chest pain . She also reports persistent diarrhea, nausea, in addition to the chest pain.  On admission, she was found to be severely hypokalemic. Troponin is negative.  EKG shows NSR. She was admitted for further evaluation.    Discharge Diagnoses:  Principal Problem:   Hypokalemia Active Problems:   Glucose intolerance (impaired glucose tolerance)   Acid reflux   Behcet's syndrome (HCC)   Chest pain  Atypical chest pain:  Resolved. Probably from hypokalemia induced muscle cramps.  Echocardiogram reviewed, unremarkable.  Troponin is negative.  EKG shows NSR.   Hypokalemia:  Replaced.    Behcets disease: - resume 60 mg of prednisone.    GERD; Stable.    Chronic diarrhea:  Recommend outpatient follow up with GI. appt scheduled.   Discharge Instructions   Allergies as of 02/16/2019      Reactions   Celebrex [celecoxib] Other (See Comments)   Bleeding/bruising including bleeding for multiple days This is a reaction d/t sulfa product   Fentanyl Nausea And Vomiting   Had mild heart attack after last surgery along with vomiting severely. Does NOT want fentanyl at all.  Versed and valium are okay   Latex Shortness Of Breath, Dermatitis   Sulfa Drugs Cross Reactors Other (See  Comments)   Eyes turn red, renal failure, stomach problems (vomiting, diarrhea), vasculature collapses, migraine like symptoms   Sulfites Other (See Comments)   Eyes turn red, renal failure, stomach problems (vomiting, diarrhea), vasculature collapses, migraine like symptoms   Simponi [golimumab] Swelling   Simponi Aria- Headache, blurred vision. (similar to remicade, but uses human dna)   Adhesive [tape] Other (See Comments)   Pulls skin right off.  PAPER TAPE is okay. tegaderm is also okay   Codeine Itching, Nausea And Vomiting   Tolerates hydrocodone    Epinephrine Other (See Comments)   Tachycardia (this is a normal side effect of this medication)   Erythromycin Itching, Rash   Has tolerated azithromycin   Loratadine Itching   Morphine And Related Itching, Rash      Medication List    STOP taking these medications   ibuprofen 200 MG tablet Commonly known as: ADVIL   Stool Softener 100 MG capsule Generic drug: docusate sodium     TAKE these medications   acetaminophen 500 MG tablet Commonly known as: TYLENOL Take 500 mg by mouth every 6 (six) hours as needed for moderate pain.   albuterol 108 (90 Base) MCG/ACT inhaler Commonly known as: VENTOLIN HFA Inhale 1 puff into the lungs every 6 (six) hours as needed for wheezing or shortness of breath.   aspirin EC 325 MG tablet Take 325 mg by mouth 2 (two) times daily.   AYR SALINE NASAL GEL NA Place 1 spray into the nose daily as needed (congestion).   calcitRIOL 0.25 MCG capsule Commonly known as: ROCALTROL Take 0.25 mcg by mouth 2 (two) times daily.  colchicine 0.6 MG tablet Take 0.6 mg by mouth 2 (two) times daily. Colcrys   Creon 12000 units Cpep capsule Generic drug: lipase/protease/amylase Take 12,000 Units by mouth 3 (three) times daily before meals.   diphenhydrAMINE 25 mg capsule Commonly known as: BENADRYL Take 25 mg by mouth at bedtime.   DULoxetine 60 MG capsule Commonly known as: CYMBALTA Take  60 mg by mouth daily.   fluocinonide cream 0.05 % Commonly known as: LIDEX Apply 1 application topically daily as needed (rash).   fluticasone 50 MCG/ACT nasal spray Commonly known as: FLONASE Place 1 spray into the nose daily as needed for allergies.   folic acid 1 MG tablet Commonly known as: FOLVITE Take 2 mg by mouth daily at 2 PM.   gabapentin 100 MG capsule Commonly known as: NEURONTIN Take 300 mg by mouth at bedtime.   leflunomide 20 MG tablet Commonly known as: ARAVA Take 20 mg by mouth daily.   loperamide 2 MG capsule Commonly known as: IMODIUM Take 1 capsule (2 mg total) by mouth as needed for diarrhea or loose stools.   metoprolol succinate 50 MG 24 hr tablet Commonly known as: TOPROL-XL TAKE 1 TABLET BY MOUTH ONCE DAILY. TAKE WITH OR IMMEDIATELY FOLLOWING A MEAL What changed: See the new instructions.   OVER THE COUNTER MEDICATION Place 1 drop into both eyes as needed (dry eyes). Crocodile tears otc eye drops   OVER THE COUNTER MEDICATION Swish and swallow 15 mLs 2 (two) times daily as needed (ulcers). Maalox mixed with liquid children's benadryl 3:1 ratio   oxyCODONE 5 MG immediate release tablet Commonly known as: Oxy IR/ROXICODONE Take 5 mg by mouth every 6 (six) hours as needed for severe pain.   potassium chloride 10 MEQ tablet Commonly known as: KLOR-CON Take 2 tablets (20 mEq total) by mouth 3 (three) times daily. What changed: when to take this   predniSONE 10 MG tablet Commonly known as: DELTASONE Take 6 tablets (60 mg total) by mouth daily. What changed: how much to take   Remicade 100 MG injection Generic drug: inFLIXimab Inject 700 mg into the vein every 6 (six) weeks.   THERMOTABS PO Take 2 tablets by mouth daily as needed (hydration). May take an additional 2 more 2 tablet doses as needed for physical activity or heat      Follow-up Information    Gardner Candle, MD. Schedule an appointment as soon as possible for a visit in 1  week(s).   Specialty: Internal Medicine Contact information: [address] [place] [address] [phone]        Thurmon Fair, MD .   Specialty: Cardiology Contact information: [address] [place] [address] [phone]          Allergies  Allergen Reactions  . Celebrex [Celecoxib] Other (See Comments)    Bleeding/bruising including bleeding for multiple days This is a reaction d/t sulfa product  . Fentanyl Nausea And Vomiting    Had mild heart attack after last surgery along with vomiting severely. Does NOT want fentanyl at all.  Versed and valium are okay  . Latex Shortness Of Breath and Dermatitis  . Sulfa Drugs Cross Reactors Other (See Comments)    Eyes turn red, renal failure, stomach problems (vomiting, diarrhea), vasculature collapses, migraine like symptoms  . Sulfites Other (See Comments)    Eyes turn red, renal failure, stomach problems (vomiting, diarrhea), vasculature collapses, migraine like symptoms  . Simponi [Golimumab] Swelling  Simponi Aria- Headache, blurred vision. (similar to remicade, but uses human dna)  . Adhesive [Tape] Other (See Comments)    Pulls skin right off.  PAPER TAPE is okay. tegaderm is also okay  . Codeine Itching and Nausea And Vomiting    Tolerates hydrocodone   . Epinephrine Other (See Comments)    Tachycardia (this is a normal side effect of this medication)  . Erythromycin Itching and Rash    Has tolerated azithromycin  . Loratadine Itching  . Morphine And Related Itching and Rash    Consultations:  none   Procedures/Studies: DG Chest 2 View  Result Date: 02/15/2019 CLINICAL DATA:  Chest pain EXAM: CHEST - 2 VIEW COMPARISON:  December 17, 2014 FINDINGS: Lungs are clear. Heart size and pulmonary vascularity are normal. No adenopathy. There is postoperative change in the lower cervical region. No pneumothorax. IMPRESSION: Lungs clear.  Heart size normal. Electronically Signed    By: Bretta Bang III M.D.   On: 02/15/2019 12:12   ECHOCARDIOGRAM COMPLETE  Result Date: 02/16/2019    ECHOCARDIOGRAM REPORT   Patient Name:   [name] Date of Exam: 02/16/2019 Medical Rec #:  [id number]                 Height:       65.0 in Accession #:    [id number]                Weight:       184.0 lb Date of Birth:  1960/06/03                 BSA:          1.91 m Patient Age:    58 years                  BP:           126/80 mmHg Patient Gender: F                         HR:           74 bpm. Exam Location:  Inpatient Procedure: 2D Echo Indications:    chest pain  History:        Patient has prior history of Echocardiogram examinations, most                 recent 09/17/2013. Stroke. Mild MI per pt, due to                 immunosuppressant therapy.  Sonographer:    Celene Skeen RDCS (AE) Referring Phys: [id number] DAVID MANUEL ORTIZ  Sonographer Comments: Image acquisition challenging due to respiratory motion. IMPRESSIONS  1. Left ventricular ejection fraction, by estimation, is 60 to 65%. The left ventricle has normal function. The left ventrical has no regional wall motion abnormalities. Left ventricular diastolic parameters were normal.  2. Right ventricular systolic function is normal. The right ventricular size is normal. There is normal pulmonary artery systolic pressure.  3. The mitral valve is grossly normal. no evidence of mitral valve regurgitation. No evidence of mitral stenosis.  4. The aortic valve is normal in structure and function. Aortic valve regurgitation is trivial . No aortic stenosis is present. FINDINGS  Left Ventricle: Left ventricular ejection fraction, by estimation, is 60 to 65%. The left ventricle has normal function. The left ventricle has no regional wall motion abnormalities. There is no left ventricular hypertrophy. Left ventricular diastolic parameters  were normal. Right Ventricle: The right ventricular size is normal. No increase in right ventricular wall  thickness. Right ventricular systolic function is normal. There is normal pulmonary artery systolic pressure. The tricuspid regurgitant velocity is 2.51 m/s, and  with an assumed right atrial pressure of 3 mmHg, the estimated right ventricular systolic pressure is 28.2 mmHg. Left Atrium: Left atrial size was normal in size. Right Atrium: Right atrial size was normal in size. Pericardium: There is no evidence of pericardial effusion. Mitral Valve: The mitral valve is grossly normal. There is mild thickening of the mitral valve leaflet(s). There is mild calcification of the mitral valve leaflet(s). No evidence of mitral valve regurgitation. No evidence of mitral valve stenosis. Tricuspid Valve: The tricuspid valve is grossly normal. Tricuspid valve regurgitation is trivial. Aortic Valve: The aortic valve is normal in structure and function. Aortic valve regurgitation is trivial. No aortic stenosis is present. Pulmonic Valve: The pulmonic valve was grossly normal. Pulmonic valve regurgitation is not visualized. Aorta: The aortic root, ascending aorta and aortic arch are all structurally normal, with no evidence of dilitation or obstruction. IAS/Shunts: The atrial septum is grossly normal.  LEFT VENTRICLE PLAX 2D LVIDd:         4.70 cm  Diastology LVIDs:         2.90 cm  LV e' lateral:   11.20 cm/s LV PW:         0.90 cm  LV E/e' lateral: 5.9 LV IVS:        1.00 cm  LV e' medial:    10.80 cm/s LVOT diam:     1.90 cm  LV E/e' medial:  6.1 LV SV:         49.62 ml LV SV Index:   35.36 LVOT Area:     2.84 cm  RIGHT VENTRICLE RV S prime:     10.90 cm/s TAPSE (M-mode): 2.5 cm LEFT ATRIUM         Index      RIGHT ATRIUM           Index LA diam:    3.30 cm 1.73 cm/m RA Area:     15.30 cm                                RA Volume:   35.00 ml  18.33 ml/m  AORTIC VALVE LVOT Vmax:   86.80 cm/s LVOT Vmean:  59.300 cm/s LVOT VTI:    0.175 m  AORTA Ao Root diam: 2.50 cm MITRAL VALVE                        TRICUSPID VALVE MV Area  (PHT): 3.77 cm             TR Peak grad:   25.2 mmHg MV Decel Time: 201 msec             TR Vmax:        251.00 cm/s MV E velocity: 65.60 cm/s 103 cm/s MV A velocity: 54.40 cm/s 70.3 cm/s SHUNTS MV E/A ratio:  1.21       1.5       Systemic VTI:  0.18 m                                     Systemic Diam: 1.90 cm Loistine Chance  Nahser MD Electronically signed by Mertie Moores MD Signature Date/Time: 02/16/2019/11:36:06 AM    Final        Subjective: No new complaints  Discharge Exam: Vitals:   02/16/19 0602 02/16/19 1444  BP: 126/80 137/72  Pulse: 74 81  Resp: 16 18  Temp: 98 F (36.7 C) 98.6 F (37 C)  SpO2: 98% 97%   Vitals:   02/15/19 1439 02/15/19 2046 02/16/19 0602 02/16/19 1444  BP: (!) 125/93 105/68 126/80 137/72  Pulse: 85 78 74 81  Resp: 18 16 16 18   Temp: 97.8 F (36.6 C) 97.9 F (36.6 C) 98 F (36.7 C) 98.6 F (37 C)  TempSrc: Oral Oral  Oral  SpO2: 95% 97% 98% 97%  Weight:      Height:        General: Pt is alert, awake, not in acute distress Cardiovascular: RRR, S1/S2 +, no rubs, no gallops Respiratory: CTA bilaterally, no wheezing, no rhonchi Abdominal: Soft, NT, ND, bowel sounds + Extremities: no edema, no cyanosis    The results of significant diagnostics from this hospitalization (including imaging, microbiology, ancillary and laboratory) are listed below for reference.     Microbiology: Recent Results (from the past 240 hour(s))  SARS CORONAVIRUS 2 (TAT 6-24 HRS) Nasopharyngeal Nasopharyngeal Swab     Status: None   Collection Time: 02/15/19  1:30 PM   Specimen: Nasopharyngeal Swab  Result Value Ref Range Status   SARS Coronavirus 2 NEGATIVE NEGATIVE Final    Comment: (NOTE) SARS-CoV-2 target nucleic acids are NOT DETECTED. The SARS-CoV-2 RNA is generally detectable in upper and lower respiratory specimens during the acute phase of infection. Negative results do not preclude SARS-CoV-2 infection, do not rule out co-infections with other pathogens, and  should not be used as the sole basis for treatment or other patient management decisions. Negative results must be combined with clinical observations, patient history, and epidemiological information. The expected result is Negative. Fact Sheet for Patients: SugarRoll.be Fact Sheet for Healthcare Providers: https://www.woods-mathews.com/ This test is not yet approved or cleared by the [place] FDA and  has been authorized for detection and/or diagnosis of SARS-CoV-2 by FDA under an Emergency Use Authorization (EUA). This EUA will remain  in effect (meaning this test can be used) for the duration of the COVID-19 declaration under Section 56 4(b)(1) of the Act, 21 U.S.C. section 360bbb-3(b)(1), unless the authorization is terminated or revoked sooner. Performed at [facility], [place] [address] [place], [place] [address]      Labs: BNP (last 3 results) No results for input(s): BNP in the last 8760 hours. Basic Metabolic Panel: Recent Labs  Lab 02/15/19 1142 02/15/19 1256 02/15/19 1540 02/16/19 0512 02/16/19 1218  NA 141 146* 145 145 143  K 3.5 2.6* 4.4 3.8 3.7  CL 104 112* 110 111 107  CO2 27  --  28 25 28   GLUCOSE 107* 85 139* 82 116*  BUN 10 7 9 10 13   CREATININE 0.68 0.40* 0.58 0.51 0.61  CALCIUM 9.3  --  9.2 8.4* 9.2  MG 2.0  --   --  1.9  --   PHOS 4.1  --   --   --   --    Liver Function Tests: No results for input(s): AST, ALT, ALKPHOS, BILITOT, PROT, ALBUMIN in the last 168 hours. No results for input(s): LIPASE, AMYLASE in the last 168 hours. No results for input(s): AMMONIA in the last 168 hours. CBC: Recent Labs  Lab 02/15/19  1142 02/15/19 1256  WBC 7.3  --   NEUTROABS 5.0  --   HGB 13.1 10.2*  HCT 41.0 30.0*  MCV 93.8  --   PLT PLATELET CLUMPS NOTED ON SMEAR, UNABLE TO ESTIMATE  --    Cardiac Enzymes: No results for input(s): CKTOTAL, CKMB, CKMBINDEX, TROPONINI in the last 168  hours. BNP: Invalid input(s): POCBNP CBG: Recent Labs  Lab 02/15/19 1627 02/16/19 0604 02/16/19 1142 02/16/19 1645  GLUCAP 125* 79 101* 129*   D-Dimer No results for input(s): DDIMER in the last 72 hours. Hgb A1c No results for input(s): HGBA1C in the last 72 hours. Lipid Profile No results for input(s): CHOL, HDL, LDLCALC, TRIG, CHOLHDL, LDLDIRECT in the last 72 hours. Thyroid function studies No results for input(s): TSH, T4TOTAL, T3FREE, THYROIDAB in the last 72 hours.  Invalid input(s): FREET3 Anemia work up No results for input(s): VITAMINB12, FOLATE, FERRITIN, TIBC, IRON, RETICCTPCT in the last 72 hours. Urinalysis    Component Value Date/Time   COLORURINE YELLOW 06/15/2014 1145   APPEARANCEUR CLEAR 06/15/2014 1145   LABSPEC 1.015 01/29/2018 1553   PHURINE 7.5 01/29/2018 1553   GLUCOSEU NEGATIVE 01/29/2018 1553   HGBUR TRACE (A) 01/29/2018 1553   BILIRUBINUR NEGATIVE 01/29/2018 1553   KETONESUR NEGATIVE 01/29/2018 1553   PROTEINUR NEGATIVE 01/29/2018 1553   UROBILINOGEN 0.2 01/29/2018 1553   NITRITE NEGATIVE 01/29/2018 1553   LEUKOCYTESUR NEGATIVE 01/29/2018 1553   Sepsis Labs Invalid input(s): PROCALCITONIN,  WBC,  LACTICIDVEN Microbiology Recent Results (from the past 240 hour(s))  SARS CORONAVIRUS 2 (TAT 6-24 HRS) Nasopharyngeal Nasopharyngeal Swab     Status: None   Collection Time: 02/15/19  1:30 PM   Specimen: Nasopharyngeal Swab  Result Value Ref Range Status   SARS Coronavirus 2 NEGATIVE NEGATIVE Final    Comment: (NOTE) SARS-CoV-2 target nucleic acids are NOT DETECTED. The SARS-CoV-2 RNA is generally detectable in upper and lower respiratory specimens during the acute phase of infection. Negative results do not preclude SARS-CoV-2 infection, do not rule out co-infections with other pathogens, and should not be used as the sole basis for treatment or other patient management decisions. Negative results must be combined with clinical  observations, patient history, and epidemiological information. The expected result is Negative. Fact Sheet for Patients: HairSlick.no Fact Sheet for Healthcare Providers: quierodirigir.com This test is not yet approved or cleared by the [place] FDA and  has been authorized for detection and/or diagnosis of SARS-CoV-2 by FDA under an Emergency Use Authorization (EUA). This EUA will remain  in effect (meaning this test can be used) for the duration of the COVID-19 declaration under Section 56 4(b)(1) of the Act, 21 U.S.C. section 360bbb-3(b)(1), unless the authorization is terminated or revoked sooner. Performed at [name], [address], [place], [place] [address]      Time coordinating discharge: 36 minutes  SIGNED:   Kathlen Mody, MD  [facility] 02/16/2019, 7:14 PM

## 2019-02-17 LAB — BASIC METABOLIC PANEL
Anion gap: 10 (ref 5–15)
BUN: 9 mg/dL (ref 6–20)
CO2: 25 mmol/L (ref 22–32)
Calcium: 8.5 mg/dL — ABNORMAL LOW (ref 8.9–10.3)
Chloride: 106 mmol/L (ref 98–111)
Creatinine, Ser: 0.56 mg/dL (ref 0.44–1.00)
GFR calc Af Amer: >60 mL/min (ref >60)
GFR calc non Af Amer: >60 mL/min (ref >60)
Glucose, Bld: 124 mg/dL — ABNORMAL HIGH (ref 70–99)
Potassium: 3 mmol/L — ABNORMAL LOW (ref 3.5–5.1)
Sodium: 141 mmol/L (ref 135–145)

## 2019-02-17 LAB — POTASSIUM: Potassium: 3.6 mmol/L (ref 3.5–5.1)

## 2019-02-17 LAB — MAGNESIUM: Magnesium: 1.6 mg/dL — ABNORMAL LOW (ref 1.7–2.4)

## 2019-02-17 LAB — GLUCOSE, CAPILLARY: Glucose-Capillary: 76 mg/dL (ref 70–99)

## 2019-02-17 LAB — CALCIUM, IONIZED: Calcium, Ionized, Serum: 4.8 mg/dL (ref 4.5–5.6)

## 2019-02-17 MED ORDER — POTASSIUM CHLORIDE CRYS ER 20 MEQ PO TBCR
60.0000 meq | EXTENDED_RELEASE_TABLET | Freq: Every day | ORAL | Status: DC
  Administered 2019-02-17: 10:00:00 60 meq via ORAL
  Filled 2019-02-17: qty 3

## 2019-02-17 MED ORDER — MAGNESIUM SULFATE 50 % IJ SOLN
3.0000 g | Freq: Once | INTRAVENOUS | Status: AC
  Administered 2019-02-17: 3 g via INTRAVENOUS
  Filled 2019-02-17: qty 6

## 2019-02-17 NOTE — Progress Notes (Signed)
Patient given discharge instructions, and verbalized an understanding of all discharge instructions.  Patient agrees with discharge plan, and is being discharged in stable medical condition.  Patient given transportation via wheelchair. 

## 2019-02-18 LAB — CALCIUM, IONIZED: Calcium, Ionized, Serum: 4.6 mg/dL (ref 4.5–5.6)

## 2019-02-21 ENCOUNTER — Encounter: Payer: Self-pay | Admitting: Cardiovascular Disease

## 2019-02-21 ENCOUNTER — Other Ambulatory Visit: Payer: Self-pay

## 2019-02-21 ENCOUNTER — Ambulatory Visit: Payer: Medicare Other | Admitting: Cardiovascular Disease

## 2019-02-21 VITALS — BP 110/73 | HR 98 | Ht 65.0 in | Wt 186.6 lb

## 2019-02-21 DIAGNOSIS — E2740 Unspecified adrenocortical insufficiency: Secondary | ICD-10-CM | POA: Diagnosis not present

## 2019-02-21 DIAGNOSIS — I951 Orthostatic hypotension: Secondary | ICD-10-CM

## 2019-02-21 DIAGNOSIS — M352 Behcet's disease: Secondary | ICD-10-CM

## 2019-02-21 DIAGNOSIS — R002 Palpitations: Secondary | ICD-10-CM | POA: Diagnosis not present

## 2019-02-21 MED ORDER — METOPROLOL SUCCINATE ER 50 MG PO TB24: ORAL_TABLET | ORAL | 1 refills | Status: DC

## 2019-02-21 MED FILL — METOPROLOL SUCCINATE ER 50: 50 | 90 days supply | Qty: 90 | Fill #0

## 2019-02-21 NOTE — Progress Notes (Signed)
Cardiology Office Note    Date:  02/23/2019   ID:  [name], DOB October 26, 1960, MRN [id number]  PCP:  Gardner Candle, MD  Cardiologist:   Thurmon Fair, MD   Chief Complaint  Patient presents with  . 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 She has numerous other medical noncardiac problems including Behcet's syndrome severe sacroiliac joint disease, gastroesophageal reflux disease, colitis with recurrent diarrhea.   She has lumbar spine surgery at [facility] with Dr. Neta Mends and had problems with adrenal insufficiency and hypotension in the postoperative period.  Nevertheless his surgery was successful and is allowing her to walk again with a lot less pain and only intermittently requiring a cane.  She has had problems with hypokalemia including an emergency room visit and brief hospitalization on February 10-11.  She takes thiazide diuretics for hypoparathyroidism.  Interestingly she presented with chest pain but did not have any abnormalities on EKG and her symptoms resolved after her potassium of 2.6 was corrected.  She is back on fairly high-dose steroids since she has had a flareup of her Behcet's syndrome.  He describes having nasal and perianal ulcers and has a lot of bruises on her skin which she attributes to vasculitis.  Her rheumatologist is Dr. Tedd Sias, also at [facility].  She believes the Remicade does not work for her anymore.  She has not had severe dizziness or syncope.  She denies palpitations, chest pain or change in her shortness of breath.  She does not have lower extremity edema, orthopnea or PND.  She is currently not receiving fludrocortisone.   She is also seeing an endocrinologist, Dr. Randon Goldsmith for steroid induced osteoporosis and Dr. Margarita Mail in the GI clinic at [facility] for dysphagia/esophageal spasm,  was canceled when her symptoms improved.  Past Medical History:  Diagnosis Date  . Acid reflux   . Adrenal insufficiency (HCC)   . Anxiety    when she gets sick she gets anxious  . Arthritis    inflammatory arthritis  . Asthma   . Behcet's syndrome (HCC)   . Chronic back pain   . Chronic neck pain   . Colitis   . Depression   . Dysrhythmia    POTS syndrome  . Foot drop    due to muscle weakness from immunosuppressants  . GERD (gastroesophageal reflux disease)   . Gluten intolerance   . IBS (irritable bowel syndrome)   . Myocardial infarction (HCC)    per pt, mild mi due to immunosuppressant therapy  . Nasal ulcer    gets occasionally from medications  . Osteoporosis    T score reported -2.7  . Pneumonia    20 years ago  . PONV (postoperative nausea and vomiting)    also low blood pressure at times. do not use lactated ringers. must have sugar with saline d/t adrenal insufficiency  . Refusal of blood transfusions as patient is Jehovah's Witness   . Renal cyst 2019  . Sickle cell trait (HCC)   . Stroke [facility]) 2014   small peripheral stroke. Mononeuritis multiplex  . Subjective visual disturbance 07/14/2012  . Superficial phlebitis and thrombophlebitis of both lower extremities 2019  . Tear of lateral meniscus of right knee, current    Tear of lateral & medial meniscus of right knee  . Tinnitus    left ear sees Dr  Ezzard Standing (ENT)  . Uveitis   . Visual changes 2019   left eye vision only with distance, right eye has floater. vascular changes    Past Surgical History:  Procedure Laterality Date  . ANTERIOR CERVICAL DECOMP/DISCECTOMY FUSION N/A 08/22/2013   Procedure: ANTERIOR CERVICAL DECOMPRESSION/DISCECTOMY FUSION 2 LEVELS;  Surgeon: Karn Cassis, MD;  Location: [facility];  Service: Neurosurgery;  Laterality: N/A;  C4-5 C5-6 Anterior cervical decompression/diskectomy/fusion  . ANTERIOR FUSION CERVICAL SPINE  08/22/2013   c4 5  6         . BACK SURGERY    . BREAST  EXCISIONAL BIOPSY Right 1986  . BREAST SURGERY     Cysts excised  . CARDIAC CATHETERIZATION  09/18/2013  . CESAREAN SECTION  03/2001   x 1  . CHOLECYSTECTOMY  12/2004  . COLONOSCOPY  03/2013  . DILATION AND CURETTAGE OF UTERUS    . EXCISION OF BREAST LESION Right 08/18/2017   Procedure: EXCISION OF RIGHT AXILLARY SOFT TISSUE MASS;  Surgeon: Carolan Shiver, MD;  Location: [facility] ORS;  Service: General;  Laterality: Right;  . HAND SURGERY Right 07/2001   dupytren  . HYSTEROSCOPY    . IR SI JOINT INJ / ARTH LEFT W/IMAG GUIDE  2018   si fusion , left   . IR SI JOINT INJ / ARTH RIGHT W/IMAG GUIDE Right 2018  . JOINT REPLACEMENT Right 11/2016   partial knee replacement  . KNEE ARTHROSCOPY WITH LATERAL MENISECTOMY Right 12/12/2012   Procedure: KNEE ARTHROSCOPY WITH LATERAL MENISECTOMY;  Surgeon: Nilda Simmer, MD;  Location: [facility];  Service: Orthopedics;  Laterality: Right;  . KNEE SURGERY Left 05/1998  . LEFT HEART CATHETERIZATION WITH CORONARY ANGIOGRAM N/A 09/18/2013   Procedure: LEFT HEART CATHETERIZATION WITH CORONARY ANGIOGRAM;  Surgeon: Micheline Chapman, MD;  Location: [facility] CATH LAB;  Service: Cardiovascular;  Laterality: N/A;  . OOPHORECTOMY     BSO  . PELVIC LAPAROSCOPY     DL  . THYROID CYST EXCISION  1983   thyroglossal duct cyst  . VAGINAL HYSTERECTOMY  07/2007   LAVH BSO    Current Medications: Outpatient Medications Prior to Visit  Medication Sig Dispense Refill  . acetaminophen (TYLENOL) 500 MG tablet Take 500 mg by mouth every 6 (six) hours as needed for moderate pain.     Marland Kitchen albuterol (PROVENTIL HFA;VENTOLIN HFA) 108 (90 BASE) MCG/ACT inhaler Inhale 1 puff into the lungs every 6 (six) hours as needed for wheezing or shortness of breath.     . Aloe-Sodium Chloride (AYR SALINE NASAL GEL NA) Place 1 spray into the nose daily as needed (congestion).    Marland Kitchen aspirin EC 325 MG tablet Take 325 mg by mouth 2 (two) times daily.     . calcitRIOL (ROCALTROL) 0.25  MCG capsule Take 0.5 mcg by mouth 2 (two) times daily.     . calcium gluconate 500 MG tablet Take 1 tablet by mouth in the morning, at noon, in the evening, and at bedtime.    . colchicine 0.6 MG tablet Take 0.6 mg by mouth 2 (two) times daily. Colcrys    . diphenhydrAMINE (BENADRYL) 25 mg capsule Take 25 mg by mouth at bedtime.     . DULoxetine (CYMBALTA) 60 MG capsule Take 60 mg by mouth daily.    . fluocinonide cream (LIDEX) 0.05 % Apply 1 application topically daily as needed (rash).     . fluticasone (FLONASE) 50 MCG/ACT nasal spray Place 1 spray into the nose  daily as needed for allergies.     . folic acid (FOLVITE) 1 MG tablet Take 2 mg by mouth daily at 2 PM.     . gabapentin (NEURONTIN) 100 MG capsule Take 300 mg by mouth at bedtime.     . inFLIXimab (REMICADE) 100 MG injection Inject 700 mg into the vein every 6 (six) weeks.     . lipase/protease/amylase (CREON) 12000 units CPEP capsule Take 12,000 Units by mouth 3 (three) times daily before meals.     Marland Kitchen loperamide (IMODIUM) 2 MG capsule Take 1 capsule (2 mg total) by mouth as needed for diarrhea or loose stools. 30 capsule 0  . magnesium oxide (MAG-OX) 400 MG tablet Take 1 tablet by mouth daily.    . Oral Electrolytes (THERMOTABS PO) Take 2 tablets by mouth daily as needed (hydration). May take an additional 2 more 2 tablet doses as needed for physical activity or heat    . OVER THE COUNTER MEDICATION Place 1 drop into both eyes as needed (dry eyes). Crocodile tears otc eye drops    . OVER THE COUNTER MEDICATION Swish and swallow 15 mLs 2 (two) times daily as needed (ulcers). Maalox mixed with liquid children's benadryl 3:1 ratio    . oxyCODONE (OXY IR/ROXICODONE) 5 MG immediate release tablet Take 5 mg by mouth every 6 (six) hours as needed for severe pain.     . potassium chloride SA (KLOR-CON) 20 MEQ tablet Take 1 tablet by mouth in the morning and at bedtime.    . predniSONE (DELTASONE) 10 MG tablet Take 6 tablets (60 mg total) by  mouth daily.    . metoprolol succinate (TOPROL-XL) 50 MG 24 hr tablet TAKE 1 TABLET BY MOUTH ONCE DAILY. TAKE WITH OR IMMEDIATELY FOLLOWING A MEAL (Patient taking differently: Take 50 mg by mouth daily. ) 90 tablet 3  . potassium chloride (KLOR-CON) 10 MEQ tablet Take 2 tablets (20 mEq total) by mouth 3 (three) times daily.    Marland Kitchen leflunomide (ARAVA) 20 MG tablet Take 20 mg by mouth daily.     No facility-administered medications prior to visit.     Allergies:   Celebrex [celecoxib], Fentanyl, Latex, Sulfa drugs cross reactors, Sulfites, Simponi [golimumab], Adhesive [tape], Codeine, Epinephrine, Erythromycin, Loratadine, and Morphine and related   Social History   Socioeconomic History  . Marital status: Divorced    Spouse name: Not on file  . Number of children: 1  . Years of education: Not on file  . Highest education level: Master's degree (e.g., MA, MS, MEng, MEd, MSW, MBA)  Occupational History    Employer: [employer]  Tobacco Use  . Smoking status: Never Smoker  . Smokeless tobacco: Never Used  Substance and Sexual Activity  . Alcohol use: No    Alcohol/week: 0.0 standard drinks  . Drug use: No  . Sexual activity: Not Currently    Birth control/protection: Surgical    Comment: 1st intercourse 22 yo-5 partners  Other Topics Concern  . Not on file  Social History Narrative  . Not on file   Social Determinants of Health   Financial Resource Strain:   . Difficulty of Paying Living Expenses: Not on file  Food Insecurity:   . Worried About Charity fundraiser in the Last Year: Not on file  . Ran Out of Food in the Last Year: Not on file  Transportation Needs:   . Lack of Transportation (Medical): Not on file  . Lack of Transportation (Non-Medical): Not on file  Physical Activity:   . Days of Exercise per Week: Not on file  . Minutes of Exercise per Session: Not on file  Stress:   . Feeling of Stress : Not on file  Social Connections:   . Frequency of Communication  with Friends and Family: Not on file  . Frequency of Social Gatherings with Friends and Family: Not on file  . Attends Religious Services: Not on file  . Active Member of Clubs or Organizations: Not on file  . Attends Banker Meetings: Not on file  . Marital Status: Not on file     Family History:  The patient's family history includes Breast cancer in her mother and sister; Diabetes in her mother and sister; Gastric cancer in her father; Hypertension in her sister; Stroke in her mother.   ROS:   Please see the history of present illness.    ROS all other systems reviewed and are negative.   PHYSICAL EXAM:   VS:  BP 110/73   Pulse 98   Ht 5\' 5"  (1.651 m)   Wt 186 lb 9.6 oz (84.6 kg)   SpO2 97%   BMI 31.05 kg/m     General: Alert, oriented x3, no distress, appears well Head: no evidence of trauma, PERRL, EOMI, no exophtalmos or lid lag, no myxedema, no xanthelasma; normal ears, nose and oropharynx Neck: normal jugular venous pulsations and no hepatojugular reflux; brisk carotid pulses without delay and no carotid bruits Chest: clear to auscultation, no signs of consolidation by percussion or palpation, normal fremitus, symmetrical and full respiratory excursions Cardiovascular: normal position and quality of the apical impulse, regular rhythm, normal first and second heart sounds, no murmurs, rubs or gallops Abdomen: no tenderness or distention, no masses by palpation, no abnormal pulsatility or arterial bruits, normal bowel sounds, no hepatosplenomegaly Extremities: A few flat bruises on both shins; no clubbing, cyanosis or edema; 2+ radial, ulnar and brachial pulses bilaterally; 2+ right femoral, posterior tibial and dorsalis pedis pulses; 2+ left femoral, posterior tibial and dorsalis pedis pulses; no subclavian or femoral bruits Neurological: grossly nonfocal Psych: Normal mood and affect   Wt Readings from Last 3 Encounters:  02/21/19 186 lb 9.6 oz (84.6 kg)    02/15/19 184 lb (83.5 kg)  12/05/18 191 lb 9.6 oz (86.9 kg)      Studies/Labs Reviewed:   EKG:  EKG is not ordered today.  ECG from 02/15/2019 shows mild sinus tachycardia but is otherwise normal tracing LABS BMET    Component Value Date/Time   NA 141 02/17/2019 0818   K 3.6 02/17/2019 1230   CL 106 02/17/2019 0818   CO2 25 02/17/2019 0818   GLUCOSE 124 (H) 02/17/2019 0818   BUN 9 02/17/2019 0818   CREATININE 0.56 02/17/2019 0818   CALCIUM 8.5 (L) 02/17/2019 0818   GFRNONAA >60 02/17/2019 0818   GFRAA >60 02/17/2019 0818    Lipid Panel    Component Value Date/Time   CHOL 175 09/18/2013 1030   TRIG 77 09/18/2013 1030   HDL 52 09/18/2013 1030   CHOLHDL 3.4 09/18/2013 1030   VLDL 15 09/18/2013 1030   LDLCALC 108 (H) 09/18/2013 1030     ASSESSMENT:    1. Orthostatic hypotension   2. Behcet's syndrome (HCC)   3. Adrenal insufficiency (HCC)   4. Palpitations      PLAN:  In order of problems listed above:  1.  Orthostatic hypotension: Borderline tachycardic.  No longer on fludrocortisone but is on high-dose prednisone.  Taking thiazides to compensate for her hypercalcemia may be counterproductive for her tendency to have tachycardia and low blood pressure. 2.  Palpitations: Subjective palpitations appear to be well controlled with a low-dose of beta-blocker. 3.  Behcet's: Followed by rheumatology at [facility] 4.  Iatrogenic adrenal insufficiency: As her dose of steroids is reduced she will be again at risk for hypovolemia.    Medication Adjustments/Labs and Tests Ordered: Current medicines are reviewed at length with the patient today.  Concerns regarding medicines are outlined above.  Medication changes, Labs and Tests ordered today are listed in the Patient Instructions below. Patient Instructions  Medication Instructions:  No changes *If you need a refill on your cardiac medications before your next appointment, please call your pharmacy*  Lab Work: None  ordered If you have labs (blood work) drawn today and your tests are completely normal, you will receive your results only by: Marland Kitchen MyChart Message (if you have MyChart) OR . A paper copy in the mail If you have any lab test that is abnormal or we need to change your treatment, we will call you to review the results.  Testing/Procedures: None ordered  Follow-Up: At [facility], you and your health needs are our priority.  As part of our continuing mission to provide you with exceptional heart care, we have created designated Provider Care Teams.  These Care Teams include your primary Cardiologist (physician) and Advanced Practice Providers (APPs -  Physician Assistants and Nurse Practitioners) who all work together to provide you with the care you need, when you need it.  Your next appointment:   6 month(s)  The format for your next appointment:   In Person  Provider:   You may see Thurmon Fair, MD or one of the following Advanced Practice Providers on your designated Care Team:    Azalee Course, PA-C  Micah Flesher, [place] or   Judy Pimple, PA-C      Signed, Thurmon Fair, MD  02/23/2019 1:50 PM    [facility] [address] [place], [place], [place]  [address] Phone: [phone]; Fax: [phone]

## 2019-02-21 NOTE — Patient Instructions (Signed)
Medication Instructions:  No changes *If you need a refill on your cardiac medications before your next appointment, please call your pharmacy*  Lab Work: None ordered If you have labs (blood work) drawn today and your tests are completely normal, you will receive your results only by: . MyChart Message (if you have MyChart) OR . A paper copy in the mail If you have any lab test that is abnormal or we need to change your treatment, we will call you to review the results.  Testing/Procedures: None ordered  Follow-Up: At [facility], you and your health needs are our priority.  As part of our continuing mission to provide you with exceptional heart care, we have created designated Provider Care Teams.  These Care Teams include your primary Cardiologist (physician) and Advanced Practice Providers (APPs -  Physician Assistants and Nurse Practitioners) who all work together to provide you with the care you need, when you need it.  Your next appointment:   6 month(s)  The format for your next appointment:   In Person  Provider:   You may see Mihai Croitoru, MD or one of the following Advanced Practice Providers on your designated Care Team:    Hao Meng, PA-C  Angela [facility], PA-C or   Krista Kroeger, PA-C   

## 2019-02-23 ENCOUNTER — Encounter: Payer: Self-pay | Admitting: Cardiovascular Disease

## 2019-02-27 MED FILL — GABAPENTIN 100 MG CAPSULE: 100 | 30 days supply | Qty: 90 | Fill #0

## 2019-03-02 MED FILL — CALCITRIOL 0.5 MCG CAPS: 0.5 | 90 days supply | Qty: 180 | Fill #0

## 2019-03-13 MED FILL — SPIRONOLACTONE-HCTZ 25-25 T: 25-25 | 30 days supply | Qty: 30 | Fill #0

## 2019-03-14 ENCOUNTER — Ambulatory Visit (INDEPENDENT_AMBULATORY_CARE_PROVIDER_SITE_OTHER): Payer: Medicare Other | Admitting: Gastroenterology

## 2019-03-14 ENCOUNTER — Encounter: Payer: Self-pay | Admitting: Gastroenterology

## 2019-03-14 VITALS — BP 122/78 | HR 102 | Temp 97.8°F | Ht 65.0 in | Wt 185.0 lb

## 2019-03-14 DIAGNOSIS — R1030 Lower abdominal pain, unspecified: Secondary | ICD-10-CM | POA: Diagnosis not present

## 2019-03-14 DIAGNOSIS — R140 Abdominal distension (gaseous): Secondary | ICD-10-CM | POA: Diagnosis not present

## 2019-03-14 DIAGNOSIS — K529 Noninfective gastroenteritis and colitis, unspecified: Secondary | ICD-10-CM | POA: Diagnosis not present

## 2019-03-14 MED ORDER — HYOSCYAMINE SULFATE 0.125 MG SL SUBL: 0.1250 mg | SUBLINGUAL_TABLET | Freq: Four times a day (QID) | SUBLINGUAL | 1 refills | Status: DC | PRN

## 2019-03-14 MED FILL — OSCIMIN SL 0.125 MG TABLET: 0.125 | 11 days supply | Qty: 45 | Fill #0

## 2019-03-14 NOTE — Patient Instructions (Signed)
If you are age 57 or older, your body mass index should be between 23-30. Your Body mass index is 30.79 kg/m. If this is out of the aforementioned range listed, please consider follow up with your Primary Care Provider.  If you are age 69 or younger, your body mass index should be between 19-25. Your Body mass index is 30.79 kg/m. If this is out of the aformentioned range listed, please consider follow up with your Primary Care Provider.   Please increase your Creon to 5 capsules with meals for 2 weeks.   You have been given a testing kit to check for small intestine bacterial overgrowth (SIBO) which is completed by a company named [facility]. Make sure to return your test in the mail using the return mailing label given you along with the kit. Your demographic and insurance information have already been sent to the company and they should be in contact with you over the next week regarding this test. Make sure to discuss with [facility] PRIOR to having the test if they have gotten informatoin from your insurance company as to how much your testing will cost out of pocket, if any. Please keep in mind that you will be getting a call from phone number [phone] or a similar number. If you do not hear from them within this time frame, please call our office at [phone].   It was a pleasure to see you today!  Dr. Loletha Carrow

## 2019-03-14 NOTE — Progress Notes (Signed)
[facility] Consult Note:  History: [name] 03/14/2019  Referring provider: Evalee Mutton, MD  Reason for consult/chief complaint: chronic diarrhea (onset x several years, reports blood with wiping , left sided abdominal pain, gas , bloated.)   Subjective  HPI:  This is a 59 year old woman referred for ongoing management of chronic digestive issues.  She has been under the care of [facility] providers for years, with details of that work-up and follow-up noted below.  Most recent telemedicine visit with them in October 2020. [name] has been bothered for years with chronic abdominal pain that is primarily lower and sometimes more toward the left, as well as frequent diarrhea.  She has bloating and gas, multiple loose stools per day with urgency, often soon after eating.  It occurs primarily during the daytime, nocturnal episodes happen as well.  She takes as needed Imodium.  She also has some rectal pain and spasm as well as perianal discomfort because of skin irritation from frequent BMs.  She has longstanding Bechet's syndrome on immune suppressive therapy, consisting of different agents over the years but most recently infliximab until it was recently discontinued while awaiting approval for Humira.  She is also on Leflunomide.  Most recent telemedicine visit with [facility] as noted below did repeats stool fat testing.  With this she was put on Creon 12,000 unit capsules, typically takes 3 with regular sized meals, 2 with smaller meal may be one with snacks.  It has caused the stool to be less "sticky", but has not changed the frequency of bowel movements, urgency, bloating or pain. [name] is concerned that she may have malabsorption because sometimes undigested food comes out in a bowel movement.  She had some recent weight loss after XLIF back surgery, but attributes that to being more mobile and tapering down on prednisone.  ROS:  Review of  Systems  Constitutional: Positive for fatigue. Negative for appetite change and unexpected weight change.  HENT: Negative for mouth sores and voice change.   Eyes: Positive for pain. Negative for redness.  Respiratory: Negative for cough and shortness of breath.   Cardiovascular: Negative for chest pain and palpitations.  Genitourinary: Negative for dysuria and hematuria.  Musculoskeletal: Positive for back pain. Negative for myalgias.  Skin: Negative for pallor and rash.  Neurological: Negative for weakness and headaches.  Hematological: Negative for adenopathy.     Past Medical History: Past Medical History:  Diagnosis Date  . Acid reflux   . Adrenal insufficiency ([place])   . Anxiety    when she gets sick she gets anxious  . Arrhythmia   . Arthritis    inflammatory arthritis  . Asthma   . Behcet's syndrome ([place])   . Chronic back pain   . Chronic neck pain   . Colitis   . Connective tissue disorder ([place])   . Depression   . Dysrhythmia    POTS syndrome  . Endometritis   . Foot drop    due to muscle weakness from immunosuppressants  . GERD (gastroesophageal reflux disease)   . Gluten intolerance   . Heart murmur   . IBS (irritable bowel syndrome)   . Myocardial infarction ([place])    per pt, mild mi due to immunosuppressant therapy  . Nasal ulcer    gets occasionally from medications  . Osteoarthritis   . Osteoporosis    T score reported -2.7  . Pneumonia    20 years ago  . PONV (postoperative nausea and  vomiting)    also low blood pressure at times. do not use lactated ringers. must have sugar with saline d/t adrenal insufficiency  . Refusal of blood transfusions as patient is Jehovah's Witness   . Renal cyst 2019  . Sickle cell trait ([place])   . Stroke [facility]) 2014   small peripheral stroke. Mononeuritis multiplex  . Subjective visual disturbance 07/14/2012  . Superficial phlebitis and thrombophlebitis of both lower extremities 2019  . Tear of lateral meniscus of  right knee, current    Tear of lateral & medial meniscus of right knee  . Tinnitus    left ear sees Dr Lucia Gaskins (ENT)  . Uveitis   . Visual changes 2019   left eye vision only with distance, right eye has floater. vascular changes     Past Surgical History: Past Surgical History:  Procedure Laterality Date  . ANTERIOR CERVICAL DECOMP/DISCECTOMY FUSION N/A 08/22/2013   Procedure: ANTERIOR CERVICAL DECOMPRESSION/DISCECTOMY FUSION 2 LEVELS;  Surgeon: Floyce Stakes, MD;  Location: [facility];  Service: Neurosurgery;  Laterality: N/A;  C4-5 C5-6 Anterior cervical decompression/diskectomy/fusion  . ANTERIOR FUSION CERVICAL SPINE  08/22/2013   c4 5  6         . AXILLARY SURGERY    . BACK SURGERY    . BREAST EXCISIONAL BIOPSY Right 1986  . BREAST SURGERY     Cysts excised  . CARDIAC CATHETERIZATION  09/18/2013  . CESAREAN SECTION  03/2001   x 1  . CHOLECYSTECTOMY  12/2004  . COLONOSCOPY  03/2013  . DILATION AND CURETTAGE OF UTERUS    . EXCISION OF BREAST LESION Right 08/18/2017   Procedure: EXCISION OF RIGHT AXILLARY SOFT TISSUE MASS;  Surgeon: Herbert Pun, MD;  Location: [facility] ORS;  Service: General;  Laterality: Right;  . HAND SURGERY Right 07/2001   dupytren  . HYSTEROSCOPY    . IR SI JOINT INJ / [place] LEFT W/IMAG GUIDE  2018   si fusion , left   . IR SI JOINT INJ / [place] RIGHT W/IMAG GUIDE Right 2018  . JOINT REPLACEMENT Right 11/2016   partial knee replacement  . KNEE ARTHROSCOPY WITH LATERAL MENISECTOMY Right 12/12/2012   Procedure: KNEE ARTHROSCOPY WITH LATERAL MENISECTOMY;  Surgeon: Lorn Junes, MD;  Location: [facility];  Service: Orthopedics;  Laterality: Right;  . KNEE SURGERY Left 05/1998  . LEFT HEART CATHETERIZATION WITH CORONARY ANGIOGRAM N/A 09/18/2013   Procedure: LEFT HEART CATHETERIZATION WITH CORONARY ANGIOGRAM;  Surgeon: Blane Ohara, MD;  Location: [facility] CATH LAB;  Service: Cardiovascular;  Laterality: N/A;  . LUMBAR [place]    .  OOPHORECTOMY     BSO  . PELVIC LAPAROSCOPY     DL  . SPINE SURGERY    . SUBMANDIBULAR MASS EXCISION    . THYROID CYST EXCISION  1983   thyroglossal duct cyst  . VAGINAL HYSTERECTOMY  07/2007   LAVH BSO     Family History: Family History  Problem Relation Age of Onset  . Diabetes Mother   . Breast cancer Mother        Age 26's  . Stroke Mother   . Depression Mother   . Hyperlipidemia Mother   . Gastric cancer Father   . Diabetes Sister   . Hypertension Sister   . Breast cancer Sister        Age 33  . Hyperlipidemia Sister   . Thyroid disease Sister   . CAD Maternal Grandfather   . Polymyositis Maternal  Aunt   . Myasthenia gravis Maternal Aunt   . Lupus Cousin   . Rheum arthritis Cousin   . Skin cancer Maternal Aunt   . Prostate cancer Paternal Uncle     Social History: Social History   Socioeconomic History  . Marital status: Divorced    Spouse name: Not on file  . Number of children: 1  . Years of education: Not on file  . Highest education level: Master's degree (e.g., MA, MS, MEng, MEd, MSW, MBA)  Occupational History  . Occupation: disabled  Tobacco Use  . Smoking status: Never Smoker  . Smokeless tobacco: Never Used  Substance and Sexual Activity  . Alcohol use: No    Alcohol/week: 0.0 standard drinks  . Drug use: No  . Sexual activity: Not Currently    Birth control/protection: Surgical    Comment: 1st intercourse 22 yo-5 partners  Other Topics Concern  . Not on file  Social History Narrative  . Not on file   Social Determinants of Health   Financial Resource Strain:   . Difficulty of Paying Living Expenses: Not on file  Food Insecurity:   . Worried About Charity fundraiser in the Last Year: Not on file  . Ran Out of Food in the Last Year: Not on file  Transportation Needs:   . Lack of Transportation (Medical): Not on file  . Lack of Transportation (Non-Medical): Not on file  Physical Activity:   . Days of Exercise per Week: Not on file    . Minutes of Exercise per Session: Not on file  Stress:   . Feeling of Stress : Not on file  Social Connections:   . Frequency of Communication with Friends and Family: Not on file  . Frequency of Social Gatherings with Friends and Family: Not on file  . Attends Religious Services: Not on file  . Active Member of Clubs or Organizations: Not on file  . Attends Archivist Meetings: Not on file  . Marital Status: Not on file    Allergies: Allergies  Allergen Reactions  . Celebrex [Celecoxib] Other (See Comments)    Bleeding/bruising including bleeding for multiple days This is a reaction d/t sulfa product  . Fentanyl Nausea And Vomiting    Had mild heart attack after last surgery along with vomiting severely. Does NOT want fentanyl at all.  Versed and valium are okay  . Latex Shortness Of Breath and Dermatitis  . Sulfa Drugs Cross Reactors Other (See Comments)    Eyes turn red, renal failure, stomach problems (vomiting, diarrhea), vasculature collapses, migraine like symptoms  . Sulfites Other (See Comments)    Eyes turn red, renal failure, stomach problems (vomiting, diarrhea), vasculature collapses, migraine like symptoms  . Simponi [Golimumab] Swelling    Simponi Aria- Headache, blurred vision. (similar to remicade, but uses human dna)  . Adhesive [Tape] Other (See Comments)    Pulls skin right off.  PAPER TAPE is okay. tegaderm is also okay  . Codeine Itching and Nausea And Vomiting    Tolerates hydrocodone   . Epinephrine Other (See Comments)    Tachycardia (this is a normal side effect of this medication)  . Erythromycin Itching and Rash    Has tolerated azithromycin  . Loratadine Itching  . Morphine And Related Itching and Rash    Outpatient Meds: Current Outpatient Medications  Medication Sig Dispense Refill  . acetaminophen (TYLENOL) 500 MG tablet Take 500 mg by mouth every 6 (six) hours as needed for  moderate pain.     Marland Kitchen albuterol (PROVENTIL  HFA;VENTOLIN HFA) 108 (90 BASE) MCG/ACT inhaler Inhale 1 puff into the lungs every 6 (six) hours as needed for wheezing or shortness of breath.     . Aloe-Sodium Chloride (AYR SALINE NASAL GEL NA) Place 1 spray into the nose daily as needed (congestion).    Marland Kitchen aspirin EC 325 MG tablet Take 325 mg by mouth 2 (two) times daily.     . calcitRIOL (ROCALTROL) 0.25 MCG capsule Take 0.5 mcg by mouth 2 (two) times daily.     . calcium gluconate 500 MG tablet Take 1 tablet by mouth in the morning, at noon, in the evening, and at bedtime.    . colchicine 0.6 MG tablet Take 0.6 mg by mouth 2 (two) times daily. Colcrys    . diphenhydrAMINE (BENADRYL) 25 mg capsule Take 25 mg by mouth at bedtime.     . DULoxetine (CYMBALTA) 60 MG capsule Take 60 mg by mouth daily.    . fluocinonide cream (LIDEX) 7.90 % Apply 1 application topically daily as needed (rash).     . fluticasone (FLONASE) 50 MCG/ACT nasal spray Place 1 spray into the nose daily as needed for allergies.     . folic acid (FOLVITE) 1 MG tablet Take 2 mg by mouth daily at 2 PM.     . gabapentin (NEURONTIN) 100 MG capsule Take 300 mg by mouth at bedtime.     . inFLIXimab (REMICADE) 100 MG injection Inject 700 mg into the vein every 6 (six) weeks.     . lipase/protease/amylase (CREON) 12000 units CPEP capsule Take 12,000 Units by mouth 3 (three) times daily before meals.     Marland Kitchen loperamide (IMODIUM) 2 MG capsule Take 1 capsule (2 mg total) by mouth as needed for diarrhea or loose stools. 30 capsule 0  . magnesium oxide (MAG-OX) 400 MG tablet Take 1 tablet by mouth daily.    . metoprolol succinate (TOPROL-XL) 50 MG 24 hr tablet TAKE 1 TABLET BY MOUTH ONCE DAILY. TAKE WITH OR IMMEDIATELY FOLLOWING A MEAL 90 tablet 1  . Oral Electrolytes (THERMOTABS PO) Take 2 tablets by mouth daily as needed (hydration). May take an additional 2 more 2 tablet doses as needed for physical activity or heat    . OVER THE COUNTER MEDICATION Place 1 drop into both eyes as needed  (dry eyes). Crocodile tears otc eye drops    . OVER THE COUNTER MEDICATION Swish and swallow 15 mLs 2 (two) times daily as needed (ulcers). Maalox mixed with liquid children's benadryl 3:1 ratio    . oxyCODONE (OXY IR/ROXICODONE) 5 MG immediate release tablet Take 5 mg by mouth every 6 (six) hours as needed for severe pain.     . potassium chloride SA (KLOR-CON) 20 MEQ tablet Take 1 tablet by mouth in the morning and at bedtime.    . predniSONE (DELTASONE) 10 MG tablet Take 6 tablets (60 mg total) by mouth daily.    Marland Kitchen spironolactone-hydrochlorothiazide (ALDACTAZIDE) 25-25 MG tablet Take 1 tablet by mouth daily.    . hyoscyamine (LEVSIN SL) 0.125 MG SL tablet Place 1 tablet (0.125 mg total) under the tongue every 6 (six) hours as needed. 45 tablet 1   No current facility-administered medications for this visit.      ___________________________________________________________________ Objective   Exam:  BP 122/78   Pulse (!) 102   Temp 97.8 F (36.6 C)   Ht 5' 5"  (1.651 m)   Wt 185 lb (83.9  kg)   BMI 30.79 kg/m    General: Well-appearing, pleasant and conversational.  Ambulatory, gets on exam table without assistance.  Eyes: sclera anicteric, no redness  ENT: oral mucosa moist without lesions, no cervical or supraclavicular lymphadenopathy  CV: RRR without murmur, S1/S2, no JVD, no peripheral edema  Resp: clear to auscultation bilaterally, normal RR and effort noted  GI: soft, left upper and right lower tenderness, with active bowel sounds. No guarding or palpable organomegaly noted.  Skin; warm and dry, no rash or jaundice noted  Neuro: awake, alert and oriented x 3. Normal gross motor function and fluent speech  Data:  Radiologic Studies:  CTAP [facility] recent - left sided tics -she brought the report to me  Other:  Clinic consult note from [place] [facility], [place] dated 10/08/2017.  Seen for pyrosis and dysphagia as well as epigastric spasms.  Indicates  history of Bechet's syndrome on chronic immunosuppressive therapy.  There was clinical suspicion for candidiasis, fluconazole prescribed. Reportedly had EGD May 2018 at [facility] for epigastric pain and GERD, normal study.  EGD planned, no report available  Telemedicine appointment on 10/17/2018 with Dr. Ihor Austin Noah Charon, NP) of [facility] for diarrhea. Noted patient was status post cholecystectomy with prior use of Questran.  Various immunosuppressive therapies for Bechet's including azathioprine and prednisone, later infliximab methotrexate.  Reported colonoscopy March 2015 normal to the terminal ileum except for diminutive tubular adenomas and hyperplastic polyp.  Random biopsies reportedly negative for microscopic colitis. Colonoscopy April 2018 with normal TI, two 4 to 6 mm adenomas and normal-appearing colon. 2017 tests for elastase and calprotectin were normal.  Fecal fat slightly elevated 24% at that time.  Plan was for repeat 24-hour fecal fat test after this telemedicine appointment.  It was normal at 3%  CBC, CRP, CMP normal September 2020  Patient believes SIBO testing was done.  Assessment: Encounter Diagnoses  Name Primary?  . Lower abdominal pain Yes  . Chronic diarrhea   . Abdominal bloating     I think the overall clinical picture is most consistent with IBS.  Fecal fat study actually has 3%, which is normal range.  Nevertheless, she needs a trial of sufficient pancreatic enzyme dosing of at least 60,000 units per meal to see if true clinical response to the medicine.  She believes bacterial overgrowth testing was done years ago, but warrants repeat testing given the ongoing symptoms, chronic immune suppressive therapy.  Home diagnostic breath kit prescribed for patient.   Plan:  In addition to above, trial of antispasmodic.  Hyoscyamine 0.125 mg 2-3 times a day as needed.  Thank you for the courtesy of this consult.  Please call me with any questions or  concerns. (Total time 60 minutes including extensive chart review, face-to-face evaluation with patient, formulation of care plan and documentation)  Nelida Meuse III  CC: Referring provider noted above

## 2019-03-20 MED FILL — predniSONE 10 MG TABS: 10 | 90 days supply | Qty: 90 | Fill #1

## 2019-03-20 MED FILL — predniSONE 20 MG TABS: 20 | 30 days supply | Qty: 90 | Fill #1

## 2019-03-28 MED FILL — CREON DR 12,000 UNITS CAP: 12000 | 29 days supply | Qty: 320 | Fill #1

## 2019-03-28 MED FILL — GABAPENTIN 100 MG CAPSULE: 100 | 30 days supply | Qty: 90 | Fill #1

## 2019-03-31 MED FILL — ATOVAQUONE 750 MG/5ML SUSP: 750 | 21 days supply | Qty: 210 | Fill #0

## 2019-03-31 MED FILL — AMOX-CLAV 875-125 MG TABLET: 875-125 | 7 days supply | Qty: 14 | Fill #0

## 2019-04-11 MED FILL — VENTOLIN HFA 90 MCG INHALER: 108 (90 BAS | 33 days supply | Qty: 18 | Fill #0

## 2019-04-11 MED FILL — SPIRONOLACTONE-HCTZ 25-25 T: 25-25 | 30 days supply | Qty: 30 | Fill #1

## 2019-04-21 MED FILL — AMITRIPTYLINE HCL 10 MG TAB: 10 | 30 days supply | Qty: 30 | Fill #0

## 2019-04-24 MED FILL — LEFLUNOMIDE 20 MG TABLET: 20 | 90 days supply | Qty: 90 | Fill #0

## 2019-04-24 MED FILL — CREON DR 12,000 UNITS CAP: 12000 | 29 days supply | Qty: 320 | Fill #2

## 2019-04-24 MED FILL — ATOVAQUONE 750 MG/5ML SUSP: 750 | 21 days supply | Qty: 210 | Fill #1

## 2019-04-28 ENCOUNTER — Other Ambulatory Visit (HOSPITAL_COMMUNITY): Payer: Self-pay | Admitting: Internal Medicine

## 2019-04-28 MED FILL — MUPIROCIN 2% OINTMENT: 2 | 7 days supply | Qty: 22 | Fill #0

## 2019-04-28 MED FILL — POTASSIUM CHLORIDE CRYS ER: 20 | 90 days supply | Qty: 270 | Fill #0

## 2019-05-01 ENCOUNTER — Other Ambulatory Visit (HOSPITAL_COMMUNITY): Payer: Self-pay | Admitting: Family

## 2019-05-01 MED FILL — DULOXETINE HCL 60 MG CPEP: 60 | 90 days supply | Qty: 90 | Fill #1

## 2019-05-01 MED FILL — GABAPENTIN 100 MG CAPSULE: 100 | 30 days supply | Qty: 90 | Fill #2

## 2019-05-02 MED FILL — FOLIC ACID 1 MG TABS: 1 | 90 days supply | Qty: 180 | Fill #0

## 2019-05-08 MED FILL — SPIRONOLACTONE-HCTZ 25-25 T: 25-25 | 30 days supply | Qty: 30 | Fill #2

## 2019-05-10 MED FILL — AMITRIPTYLINE HCL 50 MG TAB: 50 | 30 days supply | Qty: 30 | Fill #0

## 2019-05-14 MED FILL — AMITRIPTYLINE HCL 10 MG TAB: 10 | 30 days supply | Qty: 30 | Fill #1

## 2019-05-24 MED FILL — METOPROLOL SUCCINATE ER 50: 50 | 90 days supply | Qty: 90 | Fill #1

## 2019-05-31 MED FILL — CREON DR 12,000 UNITS CAP: 12000-38000 | 29 days supply | Qty: 320 | Fill #3

## 2019-06-06 MED FILL — CALCITRIOL 0.5 MCG CAPS: 0.5 | 90 days supply | Qty: 180 | Fill #1

## 2019-06-06 MED FILL — SPIRONOLACTONE-HCTZ 25-25 T: 25-25 | 30 days supply | Qty: 30 | Fill #3

## 2019-06-08 MED FILL — AMITRIPTYLINE HCL 50 MG TAB: 50 | 30 days supply | Qty: 30 | Fill #1

## 2019-06-29 MED FILL — CREON DR 24,000 UNITS CAPSU: 24000-76000 | 8 days supply | Qty: 192 | Fill #0

## 2019-07-12 MED FILL — AMITRIPTYLINE HCL 50 MG TAB: 50 | 30 days supply | Qty: 30 | Fill #2

## 2019-07-17 ENCOUNTER — Other Ambulatory Visit (HOSPITAL_COMMUNITY): Payer: Self-pay | Admitting: Gastroenterology

## 2019-07-17 MED FILL — LANSOPRAZOLE 30 MG CPDR: 30 | 30 days supply | Qty: 30 | Fill #0

## 2019-07-18 MED FILL — predniSONE 10 MG TABS: 10 | 90 days supply | Qty: 90 | Fill #2

## 2019-07-22 ENCOUNTER — Other Ambulatory Visit (HOSPITAL_COMMUNITY): Payer: Self-pay | Admitting: Internal Medicine

## 2019-07-22 MED FILL — SPIRONOLACTONE-HCTZ 25-25 T: 25-25 | 90 days supply | Qty: 90 | Fill #0

## 2019-07-24 MED FILL — LEFLUNOMIDE 20 MG TABLET: 20 | 90 days supply | Qty: 90 | Fill #1

## 2019-07-31 MED FILL — FOLIC ACID 1 MG TABS: 1 | 90 days supply | Qty: 180 | Fill #1

## 2019-07-31 MED FILL — DULOXETINE HCL 60 MG CPEP: 60 | 90 days supply | Qty: 90 | Fill #2

## 2019-08-04 ENCOUNTER — Ambulatory Visit: Payer: Medicare Other | Admitting: Cardiology

## 2019-08-04 ENCOUNTER — Encounter: Payer: Self-pay | Admitting: Cardiology

## 2019-08-04 ENCOUNTER — Other Ambulatory Visit: Payer: Self-pay

## 2019-08-04 ENCOUNTER — Telehealth: Payer: Self-pay | Admitting: *Deleted

## 2019-08-04 VITALS — BP 124/82 | HR 96 | Ht 65.0 in | Wt 202.0 lb

## 2019-08-04 DIAGNOSIS — M352 Behcet's disease: Secondary | ICD-10-CM | POA: Diagnosis not present

## 2019-08-04 DIAGNOSIS — R251 Tremor, unspecified: Secondary | ICD-10-CM | POA: Diagnosis not present

## 2019-08-04 DIAGNOSIS — I951 Orthostatic hypotension: Secondary | ICD-10-CM | POA: Diagnosis not present

## 2019-08-04 DIAGNOSIS — R9439 Abnormal result of other cardiovascular function study: Secondary | ICD-10-CM | POA: Diagnosis not present

## 2019-08-04 MED ORDER — PROPRANOLOL HCL 80 MG PO TABS: 80.0000 mg | ORAL_TABLET | Freq: Two times a day (BID) | ORAL | 1 refills | Status: DC

## 2019-08-04 MED ORDER — METOPROLOL SUCCINATE ER 100 MG PO TB24: 100.0000 mg | ORAL_TABLET | Freq: Every day | ORAL | 5 refills | Status: DC

## 2019-08-04 MED FILL — PROPRANOLOL 80 MG TABLET: 80 | 30 days supply | Qty: 60 | Fill #0

## 2019-08-04 MED FILL — METOPROLOL SUCCINATE ER 100: 100 | 30 days supply | Qty: 30 | Fill #0

## 2019-08-04 NOTE — Assessment & Plan Note (Signed)
Seen at [facility] for tremor- request OK to switch from Metoprolol 100 mg daily to Propanolol

## 2019-08-04 NOTE — Progress Notes (Signed)
I think her current dose of metoprolol is roughly equivalent to propranolol 80 mg twice daily. Would just stop one and start the other same day. Allow about a week to reach steady state. We can titrate the propranolol dose if needed and once we reach an acceptable result, can switch to once daily formulation if desired.

## 2019-08-04 NOTE — Telephone Encounter (Signed)
Per message from Dr. Royann Shivers,  I think her current dose of metoprolol is roughly equivalent to propranolol 80 mg twice daily. Would just stop one and start the other same day. Allow about a week to reach steady state. We can titrate the propranolol dose if needed and once we reach an acceptable result, can switch to once daily formulation if desired.  The patient has been called and verbalized her understanding.

## 2019-08-04 NOTE — Assessment & Plan Note (Signed)
Stable based on recent home B/P readings

## 2019-08-04 NOTE — Assessment & Plan Note (Signed)
Abnormal Myoview 2015- cath showed normal coronaries

## 2019-08-04 NOTE — Progress Notes (Signed)
Cardiology Office Note:    Date:  08/04/2019   ID:  [name], DOB October 05, 1960, MRN [id number]  PCP:  Gardner Candle, MD  Cardiologist:  Thurmon Fair, MD  Electrophysiologist:  None   Referring MD: Gardner Candle, MD   No chief complaint on file.   History of Present Illness:    [name] is a pleasant 59 y.o. 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  She has multiple medical problems including Behcet's syndrome, adrenal insufficiency, colitis, DJD, and spinal stenosis.  She had normal coronaries in 2015 after an abnormal Myoview.  She is followed by gastroenterology, rheumatology, oncology, endocrinology, and neurology at [facility]  She is followed by Dr Royann Shivers for orthostatic hypotension and tachycardia. She recently saw neurology at [facility] for tremor and is here today to determine if she can be transitioned from Metoprolol to Propanol for tremor. She has self adjusted her Metoprolol to 100 mg daily for sinus tachycardia.  Her B/P has tolerated this well based on her home readings- no significant orthostatic readings.   Past Medical History:  Diagnosis Date  . Acid reflux   . Adrenal insufficiency (HCC)   . Anxiety    when she gets sick she gets anxious  . Arrhythmia   . Arthritis    inflammatory arthritis  . Asthma   . Behcet's syndrome (HCC)   . Chronic back pain   . Chronic neck pain   . Colitis   . Connective tissue disorder (HCC)   . Depression   . Dysrhythmia    POTS syndrome  . Endometritis   . Foot drop    due to muscle weakness from immunosuppressants  . GERD (gastroesophageal reflux disease)   . Gluten intolerance   . Heart murmur   . IBS (irritable bowel syndrome)   . Myocardial infarction (HCC)    per pt, mild mi due to immunosuppressant therapy  . Nasal ulcer    gets occasionally from medications  . Osteoarthritis   . Osteoporosis    T score reported -2.7  . Pneumonia    20 years ago   . PONV (postoperative nausea and vomiting)    also low blood pressure at times. do not use lactated ringers. must have sugar with saline d/t adrenal insufficiency  . Refusal of blood transfusions as patient is Jehovah's Witness   . Renal cyst 2019  . Sickle cell trait (HCC)   . Stroke [facility]) 2014   small peripheral stroke. Mononeuritis multiplex  . Subjective visual disturbance 07/14/2012  . Superficial phlebitis and thrombophlebitis of both lower extremities 2019  . Tear of lateral meniscus of right knee, current    Tear of lateral & medial meniscus of right knee  . Tinnitus    left ear sees Dr Ezzard Standing (ENT)  . Uveitis   . Visual changes 2019   left eye vision only with distance, right eye has floater. vascular changes    Past Surgical History:  Procedure Laterality Date  . ANTERIOR CERVICAL DECOMP/DISCECTOMY FUSION N/A 08/22/2013   Procedure: ANTERIOR CERVICAL DECOMPRESSION/DISCECTOMY FUSION 2 LEVELS;  Surgeon: Karn Cassis, MD;  Location: [facility];  Service: Neurosurgery;  Laterality: N/A;  C4-5 C5-6 Anterior cervical decompression/diskectomy/fusion  . ANTERIOR FUSION CERVICAL SPINE  08/22/2013   c4 5  6         . AXILLARY SURGERY    . BACK SURGERY    . BREAST EXCISIONAL BIOPSY Right 1986  . BREAST  SURGERY     Cysts excised  . CARDIAC CATHETERIZATION  09/18/2013  . CESAREAN SECTION  03/2001   x 1  . CHOLECYSTECTOMY  12/2004  . COLONOSCOPY  03/2013  . DILATION AND CURETTAGE OF UTERUS    . EXCISION OF BREAST LESION Right 08/18/2017   Procedure: EXCISION OF RIGHT AXILLARY SOFT TISSUE MASS;  Surgeon: Carolan Shiver, MD;  Location: [facility] ORS;  Service: General;  Laterality: Right;  . HAND SURGERY Right 07/2001   dupytren  . HYSTEROSCOPY    . IR SI JOINT INJ / ARTH LEFT W/IMAG GUIDE  2018   si fusion , left   . IR SI JOINT INJ / ARTH RIGHT W/IMAG GUIDE Right 2018  . JOINT REPLACEMENT Right 11/2016   partial knee replacement  . KNEE ARTHROSCOPY WITH LATERAL  MENISECTOMY Right 12/12/2012   Procedure: KNEE ARTHROSCOPY WITH LATERAL MENISECTOMY;  Surgeon: Nilda Simmer, MD;  [facility];  Service: Orthopedics;  Laterality: Right;  . KNEE SURGERY Left 05/1998  . LEFT HEART CATHETERIZATION WITH CORONARY ANGIOGRAM N/A 09/18/2013   Procedure: LEFT HEART CATHETERIZATION WITH CORONARY ANGIOGRAM;  Surgeon: Micheline Chapman, MD;  Location: [facility] CATH LAB;  Service: Cardiovascular;  Laterality: N/A;  . LUMBAR DISC SURGERY    . OOPHORECTOMY     BSO  . PELVIC LAPAROSCOPY     DL  . SPINE SURGERY    . SUBMANDIBULAR MASS EXCISION    . THYROID CYST EXCISION  1983   thyroglossal duct cyst  . VAGINAL HYSTERECTOMY  07/2007   LAVH BSO    Current Medications: Current Meds  Medication Sig  . acetaminophen (TYLENOL) 500 MG tablet Take 500 mg by mouth every 6 (six) hours as needed for moderate pain.   . Adalimumab 40 MG/0.8ML PNKT Inject 40 mg into the skin every 14 (fourteen) days.  Marland Kitchen albuterol (PROVENTIL HFA;VENTOLIN HFA) 108 (90 BASE) MCG/ACT inhaler Inhale 1 puff into the lungs every 6 (six) hours as needed for wheezing or shortness of breath.   . Aloe-Sodium Chloride (AYR SALINE NASAL GEL NA) Place 1 spray into the nose daily as needed (congestion).  Marland Kitchen aspirin EC 325 MG tablet Take 325 mg by mouth 2 (two) times daily.   . calcitRIOL (ROCALTROL) 0.5 MCG capsule Take 0.5 mcg by mouth 2 (two) times daily.  . colchicine 0.6 MG tablet Take 0.6 mg by mouth 2 (two) times daily. Colcrys  . diphenhydrAMINE (BENADRYL) 25 mg capsule Take 25 mg by mouth at bedtime.   . DULoxetine (CYMBALTA) 60 MG capsule Take 60 mg by mouth daily.  . fluocinonide cream (LIDEX) 0.05 % Apply 1 application topically daily as needed (rash).   . fluticasone (FLONASE) 50 MCG/ACT nasal spray Place 1 spray into the nose daily as needed for allergies.   . folic acid (FOLVITE) 1 MG tablet Take 2 tablets by mouth daily. In the afternoon  . gabapentin (NEURONTIN) 100 MG capsule  Take 100 mg by mouth in the morning, at noon, and at bedtime.  . hyoscyamine (LEVSIN SL) 0.125 MG SL tablet Place 1 tablet (0.125 mg total) under the tongue every 6 (six) hours as needed.  . lansoprazole (PREVACID) 30 MG capsule Take 30 mg by mouth daily.  Marland Kitchen leflunomide (ARAVA) 20 MG tablet Take 20 mg by mouth daily.  Marland Kitchen loperamide (IMODIUM) 2 MG capsule Take 1 capsule (2 mg total) by mouth as needed for diarrhea or loose stools.  . Magnesium 500 MG CAPS Take 500 mg by  mouth in the morning and at bedtime.  . metoprolol succinate (TOPROL-XL) 100 MG 24 hr tablet Take 1 tablet (100 mg total) by mouth daily.  . Oral Electrolytes (THERMOTABS PO) Take 2 tablets by mouth daily as needed (hydration). May take an additional 2 more 2 tablet doses as needed for physical activity or heat  . OVER THE COUNTER MEDICATION Place 1 drop into both eyes as needed (dry eyes). Crocodile tears otc eye drops  . OVER THE COUNTER MEDICATION Swish and swallow 15 mLs 2 (two) times daily as needed (ulcers). Maalox mixed with liquid children's benadryl 3:1 ratio  . Pancrelipase, Lip-Prot-Amyl, 24000-76000 units CPEP 5 capsules po with meals, 3 capsules po with snacks  . potassium chloride SA (KLOR-CON) 20 MEQ tablet Take by mouth.  . spironolactone-hydrochlorothiazide (ALDACTAZIDE) 25-25 MG tablet Take 1 tablet by mouth daily.  . Turmeric (QC TUMERIC COMPLEX) 500 MG CAPS Take 600 mg by mouth daily.  Marland Kitchen XIFAXAN 550 MG TABS tablet Take 550 mg by mouth 3 (three) times daily.  . [DISCONTINUED] metoprolol succinate (TOPROL-XL) 50 MG 24 hr tablet Take 100 mg by mouth daily.     Allergies:   Celebrex [celecoxib], Fentanyl, Latex, Sulfa drugs cross reactors, Sulfites, Simponi [golimumab], Adhesive [tape], Codeine, Epinephrine, Erythromycin, Loratadine, and Morphine and related   Social History   Socioeconomic History  . Marital status: Divorced    Spouse name: Not on file  . Number of children: 1  . Years of education: Not on  file  . Highest education level: Master's degree (e.g., MA, MS, MEng, MEd, MSW, MBA)  Occupational History  . Occupation: disabled  Tobacco Use  . Smoking status: Never Smoker  . Smokeless tobacco: Never Used  Vaping Use  . Vaping Use: Never used  Substance and Sexual Activity  . Alcohol use: No    Alcohol/week: 0.0 standard drinks  . Drug use: No  . Sexual activity: Not Currently    Birth control/protection: Surgical    Comment: 1st intercourse 22 yo-5 partners  Other Topics Concern  . Not on file  Social History Narrative  . Not on file   Social Determinants of Health   Financial Resource Strain:   . Difficulty of Paying Living Expenses:   Food Insecurity:   . Worried About Programme researcher, broadcasting/film/video in the Last Year:   . Barista in the Last Year:   Transportation Needs:   . Freight forwarder (Medical):   Marland Kitchen Lack of Transportation (Non-Medical):   Physical Activity:   . Days of Exercise per Week:   . Minutes of Exercise per Session:   Stress:   . Feeling of Stress :   Social Connections:   . Frequency of Communication with Friends and Family:   . Frequency of Social Gatherings with Friends and Family:   . Attends Religious Services:   . Active Member of Clubs or Organizations:   . Attends Banker Meetings:   Marland Kitchen Marital Status:      Family History: The patient's family history includes Breast cancer in her mother and sister; CAD in her maternal grandfather; Depression in her mother; Diabetes in her mother and sister; Gastric cancer in her father; Hyperlipidemia in her mother and sister; Hypertension in her sister; Lupus in her cousin; Myasthenia gravis in her maternal aunt; Polymyositis in her maternal aunt; Prostate cancer in her paternal uncle; Rheum arthritis in her cousin; Skin cancer in her maternal aunt; Stroke in her mother; Thyroid disease in  her sister.  ROS:   Please see the history of present illness.     All other systems reviewed and  are negative.  EKGs/Labs/Other Studies Reviewed:    The following studies were reviewed today: Cath 2015 Echo 02-16-2019- IMPRESSIONS    1. Left ventricular ejection fraction, by estimation, is 60 to 65%. The  left ventricle has normal function. The left ventrical has no regional  wall motion abnormalities. Left ventricular diastolic parameters were  normal.  2. Right ventricular systolic function is normal. The right ventricular  size is normal. There is normal pulmonary artery systolic pressure.  3. The mitral valve is grossly normal. no evidence of mitral valve  regurgitation. No evidence of mitral stenosis.  4. The aortic valve is normal in structure and function. Aortic valve  regurgitation is trivial . No aortic stenosis is present.   EKG:  EKG is not ordered today.  The ekg ordered 02/15/2019 demonstrates NSR, HR 92  Recent Labs: 02/15/2019: Hemoglobin 10.2; Platelets PLATELET CLUMPS NOTED ON SMEAR, UNABLE TO ESTIMATE 02/17/2019: BUN 9; Creatinine, Ser 0.56; Magnesium 1.6; Potassium 3.6; Sodium 141  Recent Lipid Panel    Component Value Date/Time   CHOL 175 09/18/2013 1030   TRIG 77 09/18/2013 1030   HDL 52 09/18/2013 1030   CHOLHDL 3.4 09/18/2013 1030   VLDL 15 09/18/2013 1030   LDLCALC 108 (H) 09/18/2013 1030    Physical Exam:    VS:  BP 124/82   Pulse 96   Ht 5\' 5"  (1.651 m)   Wt 202 lb (91.6 kg)   BMI 33.61 kg/m     Wt Readings from Last 3 Encounters:  08/04/19 202 lb (91.6 kg)  03/14/19 185 lb (83.9 kg)  02/21/19 186 lb 9.6 oz (84.6 kg)     GEN: Well nourished, well developed AA female, in no acute distress HEENT: Normal NECK: No JVD; No carotid bruits  CARDIAC: RRR,-HR 100- no murmurs, rubs, gallops RESPIRATORY:  Clear to auscultation without rales, wheezing or rhonchi  ABDOMEN: Soft, non-tender, non-distended MUSCULOSKELETAL:  No edema; No deformity  SKIN: Warm and dry NEUROLOGIC:  Alert and oriented x 3 PSYCHIATRIC:  Normal affect    ASSESSMENT:    Tremor Seen at [facility] for [facility]or- request OK to switch from Metoprolol 100 mg daily to Propanolol  Orthostatic hypotension Stable based on recent home B/P readings  Abnormal cardiovascular stress test Abnormal Myoview 2015- cath showed normal coronaries  Behcet's syndrome (HCC) On high dose aspirin  PLAN:    Will review with Dr [facility]   Medication Adjustments/Labs and Tests Ordered: Current medicines are reviewed at length with the patient today.  Concerns regarding medicines are outlined above.  No orders of the defined types were placed in this encounter.  Meds ordered this encounter  Medications  . metoprolol succinate (TOPROL-XL) 100 MG 24 hr tablet    Sig: Take 1 tablet (100 mg total) by mouth daily.    Dispense:  30 tablet    Refill:  5    Patient Instructions  Medication Instructions:  Your physician recommends that you continue on your current medications as directed. Please refer to the Current Medication list given to you today.  *If you need a refill on your cardiac medications before your next appointment, please call your pharmacy*   Follow-Up: At [facility], you and your health needs are our priority.  As part of our continuing mission to provide you with exceptional heart care, we have created designated Provider Care Teams.  These Care Teams include your primary Cardiologist (physician) and Advanced Practice Providers (APPs -  Physician Assistants and Nurse Practitioners) who all work together to provide you with the care you need, when you need it.  We recommend signing up for the patient portal called "MyChart".  Sign up information is provided on this After Visit Summary.  MyChart is used to connect with patients for Virtual Visits (Telemedicine).  Patients are able to view lab/test results, encounter notes, upcoming appointments, etc.  Non-urgent messages can be sent to your provider as well.   To learn more about what you can do  with MyChart, go to [email or web address].    Your next appointment:   August 12 at 8:00 AM  The format for your next appointment:   In Person  Provider:   Thurmon Fair, MD       Signed, Corine Shelter, PA-C  08/04/2019 9:14 AM    [facility]

## 2019-08-04 NOTE — Patient Instructions (Signed)
Medication Instructions:  Your physician recommends that you continue on your current medications as directed. Please refer to the Current Medication list given to you today.  *If you need a refill on your cardiac medications before your next appointment, please call your pharmacy*   Follow-Up: At [facility], you and your health needs are our priority.  As part of our continuing mission to provide you with exceptional heart care, we have created designated Provider Care Teams.  These Care Teams include your primary Cardiologist (physician) and Advanced Practice Providers (APPs -  Physician Assistants and Nurse Practitioners) who all work together to provide you with the care you need, when you need it.  We recommend signing up for the patient portal called "MyChart".  Sign up information is provided on this After Visit Summary.  MyChart is used to connect with patients for Virtual Visits (Telemedicine).  Patients are able to view lab/test results, encounter notes, upcoming appointments, etc.  Non-urgent messages can be sent to your provider as well.   To learn more about what you can do with MyChart, go to [email or web address].    Your next appointment:   August 12 at 8:00 AM  The format for your next appointment:   In Person  Provider:   Thurmon Fair, MD

## 2019-08-04 NOTE — Assessment & Plan Note (Signed)
On high dose aspirin

## 2019-08-08 MED FILL — GABAPENTIN 100 MG CAP: 100 | 30 days supply | Qty: 90 | Fill #4

## 2019-08-17 ENCOUNTER — Other Ambulatory Visit: Payer: Self-pay

## 2019-08-17 ENCOUNTER — Encounter: Payer: Self-pay | Admitting: Cardiovascular Disease

## 2019-08-17 ENCOUNTER — Ambulatory Visit: Payer: Medicare Other | Admitting: Cardiovascular Disease

## 2019-08-17 VITALS — BP 122/73 | HR 81 | Ht 65.0 in | Wt 202.4 lb

## 2019-08-17 DIAGNOSIS — R002 Palpitations: Secondary | ICD-10-CM | POA: Diagnosis not present

## 2019-08-17 DIAGNOSIS — M352 Behcet's disease: Secondary | ICD-10-CM

## 2019-08-17 DIAGNOSIS — I951 Orthostatic hypotension: Secondary | ICD-10-CM | POA: Diagnosis not present

## 2019-08-17 NOTE — Patient Instructions (Signed)

## 2019-08-18 MED FILL — LANSOPRAZOLE 30 MG CPDR: 30 | 30 days supply | Qty: 30 | Fill #1

## 2019-08-18 NOTE — Progress Notes (Signed)
Cardiology Office Note    Date:  08/18/2019   ID:  [name], DOB 08/06/1960, MRN [id number]  PCP:  Gardner Candle, MD  Cardiologist:   Thurmon Fair, MD   No chief complaint on file.   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 She has numerous other medical noncardiac problems including Behcet's syndrome severe sacroiliac joint disease, gastroesophageal reflux disease, colitis with recurrent diarrhea.   She has been recently evaluated for tremor at [facility] and switched from metoprolol to propranolol with improvement.  For a while during the transition she had some issues with increased palpitations, increased diaphoresis and breathlessness, but she has returned to a good steady state.  She is walking 0.5 mile laps in the gym at [facility].  She has not had any severe dizziness and has not experienced syncope.  Her blood pressure is consistently in the 120s/70s.  She is also been diagnosed with a pituitary macroadenoma that appears to be nonsecreting based on lab results.  She is no longer receiving fludrocortisone for what appears to have been transient adrenal suppression.  She is actually taking spironolactone/hydrochlorothiazide for problems with mild hypocalcemia.   She is also seeing an endocrinologist, Dr. Randon Goldsmith for steroid induced osteoporosis and Dr. Margarita Mail in the GI clinic at [facility] for dysphagia/esophageal spasm, was canceled when her symptoms improved.  Past Medical History:  Diagnosis Date  . Acid reflux   . Adrenal insufficiency (HCC)   . Anxiety    when she gets sick she gets anxious  . Arrhythmia   . Arthritis    inflammatory arthritis  . Asthma   . Behcet's syndrome (HCC)   . Chronic back pain   . Chronic neck pain   . Colitis   . Connective tissue disorder (HCC)   . Depression   . Dysrhythmia    POTS  syndrome  . Endometritis   . Foot drop    due to muscle weakness from immunosuppressants  . GERD (gastroesophageal reflux disease)   . Gluten intolerance   . Heart murmur   . IBS (irritable bowel syndrome)   . Myocardial infarction (HCC)    per pt, mild mi due to immunosuppressant therapy  . Nasal ulcer    gets occasionally from medications  . Osteoarthritis   . Osteoporosis    T score reported -2.7  . Pneumonia    20 years ago  . PONV (postoperative nausea and vomiting)    also low blood pressure at times. do not use lactated ringers. must have sugar with saline d/t adrenal insufficiency  . Refusal of blood transfusions as patient is Jehovah's Witness   . Renal cyst 2019  . Sickle cell trait (HCC)   . Stroke [facility]) 2014   small peripheral stroke. Mononeuritis multiplex  . Subjective visual disturbance 07/14/2012  . Superficial phlebitis and thrombophlebitis of both lower extremities 2019  . Tear of lateral meniscus of right knee, current    Tear of lateral & medial meniscus of right knee  . Tinnitus    left ear sees Dr Ezzard Standing (ENT)  . Uveitis   . Visual changes 2019   left eye vision only with distance, right eye has floater. vascular changes    Past Surgical History:  Procedure Laterality Date  . ANTERIOR CERVICAL DECOMP/DISCECTOMY FUSION N/A 08/22/2013   Procedure: ANTERIOR CERVICAL DECOMPRESSION/DISCECTOMY FUSION 2 LEVELS;  Surgeon:  Karn Cassis, MD;  Location: [facility];  Service: Neurosurgery;  Laterality: N/A;  C4-5 C5-6 Anterior cervical decompression/diskectomy/fusion  . ANTERIOR FUSION CERVICAL SPINE  08/22/2013   c4 5  6         . AXILLARY SURGERY    . BACK SURGERY    . BREAST EXCISIONAL BIOPSY Right 1986  . BREAST SURGERY     Cysts excised  . CARDIAC CATHETERIZATION  09/18/2013  . CESAREAN SECTION  03/2001   x 1  . CHOLECYSTECTOMY  12/2004  . COLONOSCOPY  03/2013  . DILATION AND CURETTAGE OF UTERUS    . EXCISION OF BREAST LESION Right 08/18/2017    Procedure: EXCISION OF RIGHT AXILLARY SOFT TISSUE MASS;  Surgeon: Carolan Shiver, MD;  Location: [facility] ORS;  Service: General;  Laterality: Right;  . HAND SURGERY Right 07/2001   dupytren  . HYSTEROSCOPY    . IR SI JOINT INJ / ARTH LEFT W/IMAG GUIDE  2018   si fusion , left   . IR SI JOINT INJ / ARTH RIGHT W/IMAG GUIDE Right 2018  . JOINT REPLACEMENT Right 11/2016   partial knee replacement  . KNEE ARTHROSCOPY WITH LATERAL MENISECTOMY Right 12/12/2012   Procedure: KNEE ARTHROSCOPY WITH LATERAL MENISECTOMY;  Surgeon: Nilda Simmer, MD;  Location: [facility];  Service: Orthopedics;  Laterality: Right;  . KNEE SURGERY Left 05/1998  . LEFT HEART CATHETERIZATION WITH CORONARY ANGIOGRAM N/A 09/18/2013   Procedure: LEFT HEART CATHETERIZATION WITH CORONARY ANGIOGRAM;  Surgeon: Micheline Chapman, MD;  Location: [facility] CATH LAB;  Service: Cardiovascular;  Laterality: N/A;  . LUMBAR DISC SURGERY    . OOPHORECTOMY     BSO  . PELVIC LAPAROSCOPY     DL  . SPINE SURGERY    . SUBMANDIBULAR MASS EXCISION    . THYROID CYST EXCISION  1983   thyroglossal duct cyst  . VAGINAL HYSTERECTOMY  07/2007   LAVH BSO    Current Medications: Outpatient Medications Prior to Visit  Medication Sig Dispense Refill  . acetaminophen (TYLENOL) 500 MG tablet Take 500 mg by mouth every 6 (six) hours as needed for moderate pain.     . Adalimumab 40 MG/0.8ML PNKT Inject 40 mg into the skin every 14 (fourteen) days.    Marland Kitchen albuterol (PROVENTIL HFA;VENTOLIN HFA) 108 (90 BASE) MCG/ACT inhaler Inhale 1 puff into the lungs every 6 (six) hours as needed for wheezing or shortness of breath.     . Aloe-Sodium Chloride (AYR SALINE NASAL GEL NA) Place 1 spray into the nose daily as needed (congestion).    Marland Kitchen aspirin EC 325 MG tablet Take 325 mg by mouth 2 (two) times daily.     . calcitRIOL (ROCALTROL) 0.5 MCG capsule Take 0.5 mcg by mouth 2 (two) times daily.    . colchicine 0.6 MG tablet Take 0.6 mg by mouth 2 (two)  times daily. Colcrys    . diphenhydrAMINE (BENADRYL) 25 mg capsule Take 25 mg by mouth at bedtime.     . DULoxetine (CYMBALTA) 60 MG capsule Take 60 mg by mouth daily.    . fluocinonide cream (LIDEX) 0.05 % Apply 1 application topically daily as needed (rash).     . fluticasone (FLONASE) 50 MCG/ACT nasal spray Place 1 spray into the nose daily as needed for allergies.     . folic acid (FOLVITE) 1 MG tablet Take 2 tablets by mouth daily. In the afternoon    . gabapentin (NEURONTIN) 100 MG capsule Take 100  mg by mouth in the morning, at noon, and at bedtime.    . hyoscyamine (LEVSIN SL) 0.125 MG SL tablet Place 1 tablet (0.125 mg total) under the tongue every 6 (six) hours as needed. 45 tablet 1  . lansoprazole (PREVACID) 30 MG capsule Take 30 mg by mouth daily.    Marland Kitchen leflunomide (ARAVA) 20 MG tablet Take 20 mg by mouth daily.    Marland Kitchen loperamide (IMODIUM) 2 MG capsule Take 1 capsule (2 mg total) by mouth as needed for diarrhea or loose stools. 30 capsule 0  . Magnesium 500 MG CAPS Take 500 mg by mouth in the morning and at bedtime.    . Oral Electrolytes (THERMOTABS PO) Take 2 tablets by mouth daily as needed (hydration). May take an additional 2 more 2 tablet doses as needed for physical activity or heat    . OVER THE COUNTER MEDICATION Place 1 drop into both eyes as needed (dry eyes). Crocodile tears otc eye drops    . OVER THE COUNTER MEDICATION Swish and swallow 15 mLs 2 (two) times daily as needed (ulcers). Maalox mixed with liquid children's benadryl 3:1 ratio    . Pancrelipase, Lip-Prot-Amyl, 24000-76000 units CPEP 5 capsules po with meals, 3 capsules po with snacks    . potassium chloride SA (KLOR-CON) 20 MEQ tablet Take 20 mEq by mouth 3 (three) times daily.     . propranolol (INDERAL) 80 MG tablet Take 1 tablet (80 mg total) by mouth 2 (two) times daily. 60 tablet 1  . spironolactone-hydrochlorothiazide (ALDACTAZIDE) 25-25 MG tablet Take 1 tablet by mouth daily.    . Turmeric (QC TUMERIC  COMPLEX) 500 MG CAPS Take 600 mg by mouth daily.    Marland Kitchen XIFAXAN 550 MG TABS tablet Take 550 mg by mouth 3 (three) times daily.     No facility-administered medications prior to visit.     Allergies:   Celebrex [celecoxib], Fentanyl, Latex, Sulfa drugs cross reactors, Sulfites, Simponi [golimumab], Adhesive [tape], Codeine, Epinephrine, Erythromycin, Loratadine, and Morphine and related   Social History   Socioeconomic History  . Marital status: Divorced    Spouse name: Not on file  . Number of children: 1  . Years of education: Not on file  . Highest education level: Master's degree (e.g., MA, MS, MEng, MEd, MSW, MBA)  Occupational History  . Occupation: disabled  Tobacco Use  . Smoking status: Never Smoker  . Smokeless tobacco: Never Used  Vaping Use  . Vaping Use: Never used  Substance and Sexual Activity  . Alcohol use: No    Alcohol/week: 0.0 standard drinks  . Drug use: No  . Sexual activity: Not Currently    Birth control/protection: Surgical    Comment: 1st intercourse 22 yo-5 partners  Other Topics Concern  . Not on file  Social History Narrative  . Not on file   Social Determinants of Health   Financial Resource Strain:   . Difficulty of Paying Living Expenses:   Food Insecurity:   . Worried About Programme researcher, broadcasting/film/video in the Last Year:   . Barista in the Last Year:   Transportation Needs:   . Freight forwarder (Medical):   Marland Kitchen Lack of Transportation (Non-Medical):   Physical Activity:   . Days of Exercise per Week:   . Minutes of Exercise per Session:   Stress:   . Feeling of Stress :   Social Connections:   . Frequency of Communication with Friends and Family:   .  Frequency of Social Gatherings with Friends and Family:   . Attends Religious Services:   . Active Member of Clubs or Organizations:   . Attends Banker Meetings:   Marland Kitchen Marital Status:      Family History:  The patient's family history includes Breast cancer in her  mother and sister; CAD in her maternal grandfather; Depression in her mother; Diabetes in her mother and sister; Gastric cancer in her father; Hyperlipidemia in her mother and sister; Hypertension in her sister; Lupus in her cousin; Myasthenia gravis in her maternal aunt; Polymyositis in her maternal aunt; Prostate cancer in her paternal uncle; Rheum arthritis in her cousin; Skin cancer in her maternal aunt; Stroke in her mother; Thyroid disease in her sister.   ROS:   Please see the history of present illness.    ROS The patient specifically denies any chest pain at rest or with exertion, dyspnea at rest or with exertion, orthopnea, paroxysmal nocturnal dyspnea, syncope, palpitations, focal neurological deficits, intermittent claudication, lower extremity edema, unexplained weight gain, cough, hemoptysis or wheezing.  PHYSICAL EXAM:   VS:  BP 122/73   Pulse 81   Ht 5\' 5"  (1.651 m)   Wt 202 lb 6.4 oz (91.8 kg)   SpO2 97%   BMI 33.68 kg/m      General: Alert, oriented x3, no distress, mildly obese Head: no evidence of trauma, PERRL, EOMI, no exophtalmos or lid lag, no myxedema, no xanthelasma; normal ears, nose and oropharynx Neck: normal jugular venous pulsations and no hepatojugular reflux; brisk carotid pulses without delay and no carotid bruits Chest: clear to auscultation, no signs of consolidation by percussion or palpation, normal fremitus, symmetrical and full respiratory excursions Cardiovascular: normal position and quality of the apical impulse, regular rhythm, normal first and second heart sounds, no murmurs, rubs or gallops Abdomen: no tenderness or distention, no masses by palpation, no abnormal pulsatility or arterial bruits, normal bowel sounds, no hepatosplenomegaly Extremities: no clubbing, cyanosis or edema; 2+ radial, ulnar and brachial pulses bilaterally; 2+ right femoral, posterior tibial and dorsalis pedis pulses; 2+ left femoral, posterior tibial and dorsalis pedis  pulses; no subclavian or femoral bruits Neurological: grossly nonfocal Psych: Normal mood and affect    Wt Readings from Last 3 Encounters:  08/17/19 202 lb 6.4 oz (91.8 kg)  08/04/19 202 lb (91.6 kg)  03/14/19 185 lb (83.9 kg)      Studies/Labs Reviewed:   EKG:  EKG is ordered today shows normal sinus rhythm with generalized low voltage QRS, relatively poor R wave progression but no ischemic repolarization abnormalities, QTC 425 ms LABS BMET    Component Value Date/Time   NA 141 02/17/2019 0818   K 3.6 02/17/2019 1230   CL 106 02/17/2019 0818   CO2 25 02/17/2019 0818   GLUCOSE 124 (H) 02/17/2019 0818   BUN 9 02/17/2019 0818   CREATININE 0.56 02/17/2019 0818   CALCIUM 8.5 (L) 02/17/2019 0818   GFRNONAA >60 02/17/2019 0818   GFRAA >60 02/17/2019 0818    Lipid Panel    Component Value Date/Time   CHOL 175 09/18/2013 1030   TRIG 77 09/18/2013 1030   HDL 52 09/18/2013 1030   CHOLHDL 3.4 09/18/2013 1030   VLDL 15 09/18/2013 1030   LDLCALC 108 (H) 09/18/2013 1030     ASSESSMENT:    1. Orthostatic hypotension   2. Palpitations   3. Behcet's disease (HCC)      PLAN:  In order of problems listed above:  1.  Orthostatic hypotension: This seems to have resolved.  Not only she no longer taking fludrocortisone but seems to be doing well on a combination of hydrochlorothiazide (to help with her hypocalcemia) and spironolactone (to help with the hypokalemia caused by hydrochlorothiazide). 2.  Palpitations: Taking propranolol to help both with the palpitations and her tremor. 3.  Behcet's: Rheumatologist at [facility].     Medication Adjustments/Labs and Tests Ordered: Current medicines are reviewed at length with the patient today.  Concerns regarding medicines are outlined above.  Medication changes, Labs and Tests ordered today are listed in the Patient Instructions below. Patient Instructions  Medication Instructions:  No changes *If you need a refill on your cardiac  medications before your next appointment, please call your pharmacy*   Lab Work: None ordered If you have labs (blood work) drawn today and your tests are completely normal, you will receive your results only by: Marland Kitchen MyChart Message (if you have MyChart) OR . A paper copy in the mail If you have any lab test that is abnormal or we need to change your treatment, we will call you to review the results.   Testing/Procedures: None ordered   Follow-Up: At [facility], you and your health needs are our priority.  As part of our continuing mission to provide you with exceptional heart care, we have created designated Provider Care Teams.  These Care Teams include your primary Cardiologist (physician) and Advanced Practice Providers (APPs -  Physician Assistants and Nurse Practitioners) who all work together to provide you with the care you need, when you need it.  We recommend signing up for the patient portal called "MyChart".  Sign up information is provided on this After Visit Summary.  MyChart is used to connect with patients for Virtual Visits (Telemedicine).  Patients are able to view lab/test results, encounter notes, upcoming appointments, etc.  Non-urgent messages can be sent to your provider as well.   To learn more about what you can do with MyChart, go to [email or web address].    Your next appointment:   12 month(s)  The format for your next appointment:   In Person  Provider:   You may see Thurmon Fair, MD or one of the following Advanced Practice Providers on your designated Care Team:    Azalee Course, PA-C  Micah Flesher, [place] or   Judy Pimple, PA-C     Signed, Thurmon Fair, MD  08/18/2019 5:20 PM    [facility] [address] [place], [place], [place]  [address] Phone: [phone]; Fax: [phone]

## 2019-08-29 MED FILL — POTASSIUM CHLORIDE CRYS ER: 20 | 90 days supply | Qty: 270 | Fill #1

## 2019-09-01 ENCOUNTER — Other Ambulatory Visit (HOSPITAL_COMMUNITY): Payer: Self-pay | Admitting: Internal Medicine

## 2019-09-01 MED FILL — CALCITRIOL 0.5 MCG CAPS: 0.5 | 90 days supply | Qty: 180 | Fill #0

## 2019-09-06 MED FILL — PROPRANOLOL 80 MG TABLET: 80 | 30 days supply | Qty: 60 | Fill #1

## 2019-09-21 MED FILL — LANSOPRAZOLE 30 MG CPDR: 30 | 30 days supply | Qty: 30 | Fill #2

## 2019-10-06 ENCOUNTER — Other Ambulatory Visit: Payer: Self-pay | Admitting: Cardiovascular Disease

## 2019-10-06 MED FILL — PROPRANOLOL 80 MG TABLET: 80 | 90 days supply | Qty: 180 | Fill #0

## 2019-10-10 MED FILL — predniSONE 10 MG TABS: 10 | 90 days supply | Qty: 90 | Fill #3

## 2019-10-20 MED FILL — LANSOPRAZOLE 30 MG CPDR: 30 | 30 days supply | Qty: 30 | Fill #3

## 2019-10-24 MED FILL — SPIRONOLACTONE-HCTZ 25-25 T: 25-25 | 90 days supply | Qty: 90 | Fill #1

## 2019-10-24 MED FILL — LEFLUNOMIDE 20 MG TABLET: 20 | 90 days supply | Qty: 90 | Fill #2

## 2019-10-24 MED FILL — GABAPENTIN 100 MG CAPSULE: 100 | 30 days supply | Qty: 90 | Fill #5

## 2019-10-25 MED FILL — DULOXETINE HCL 60 MG CPEP: 60 | 90 days supply | Qty: 90 | Fill #3

## 2019-11-13 MED FILL — FOLIC ACID 1 MG TABS: 1 | 90 days supply | Qty: 180 | Fill #2

## 2019-11-20 MED FILL — LANSOPRAZOLE 30 MG CPDR: 30 | 30 days supply | Qty: 30 | Fill #4

## 2019-11-27 MED FILL — CALCITRIOL 0.5 MCG CAPS: 0.5 | 90 days supply | Qty: 180 | Fill #1

## 2019-11-27 MED FILL — POTASSIUM CHLORIDE CRYS ER: 20 | 90 days supply | Qty: 270 | Fill #2

## 2019-12-20 MED FILL — LANSOPRAZOLE 30 MG CPDR: 30 | 30 days supply | Qty: 30 | Fill #5

## 2019-12-21 ENCOUNTER — Other Ambulatory Visit (HOSPITAL_COMMUNITY): Payer: Self-pay | Admitting: Internal Medicine

## 2019-12-21 MED FILL — GABAPENTIN 100 MG CAPSULE: 100 | 30 days supply | Qty: 90 | Fill #0

## 2019-12-26 ENCOUNTER — Other Ambulatory Visit (HOSPITAL_COMMUNITY): Payer: Self-pay | Admitting: Family

## 2020-01-05 MED FILL — PROPRANOLOL 80 MG TABLET: 80 | 90 days supply | Qty: 180 | Fill #1

## 2020-01-17 MED FILL — LANSOPRAZOLE 30 MG CPDR: 30 | 30 days supply | Qty: 30 | Fill #6

## 2020-01-17 MED FILL — LEFLUNOMIDE 20 MG TABLET: 20 | 90 days supply | Qty: 90 | Fill #0

## 2020-01-18 ENCOUNTER — Other Ambulatory Visit (HOSPITAL_COMMUNITY): Payer: Self-pay | Admitting: Family

## 2020-01-18 MED FILL — predniSONE 10 MG TABS: 10 | 90 days supply | Qty: 90 | Fill #0

## 2020-01-25 ENCOUNTER — Other Ambulatory Visit (HOSPITAL_COMMUNITY): Payer: Self-pay | Admitting: Family

## 2020-01-25 MED FILL — DULOXETINE HCL 60 MG CPEP: 60 | 90 days supply | Qty: 90 | Fill #0

## 2020-01-25 MED FILL — GABAPENTIN 100 MG CAPSULE: 100 | 30 days supply | Qty: 90 | Fill #1

## 2020-02-12 MED FILL — FOLIC ACID 1 MG TABS: 1 | 90 days supply | Qty: 180 | Fill #3

## 2020-02-21 MED FILL — LANSOPRAZOLE 30 MG CPDR: 30 | 30 days supply | Qty: 30 | Fill #7

## 2020-03-01 MED FILL — POTASSIUM CHLORIDE CRYS ER: 20 | 90 days supply | Qty: 270 | Fill #3

## 2020-03-25 ENCOUNTER — Other Ambulatory Visit (HOSPITAL_COMMUNITY): Payer: Self-pay | Admitting: Rheumatology

## 2020-03-25 MED FILL — predniSONE 2.5 MG TABS: 2.5 | 30 days supply | Qty: 30 | Fill #0

## 2020-04-01 ENCOUNTER — Other Ambulatory Visit: Payer: Self-pay

## 2020-04-01 ENCOUNTER — Inpatient Hospital Stay (HOSPITAL_COMMUNITY)
Admission: EM | Admit: 2020-04-01 | Discharge: 2020-04-04 | DRG: 481 | Disposition: A | Discharge status: Home Health | Payer: Medicare Other | Attending: Internal Medicine | Admitting: Internal Medicine

## 2020-04-01 ENCOUNTER — Emergency Department (HOSPITAL_COMMUNITY): Payer: Medicare Other

## 2020-04-01 ENCOUNTER — Encounter (HOSPITAL_COMMUNITY): Payer: Self-pay | Admitting: Internal Medicine

## 2020-04-01 DIAGNOSIS — E273 Drug-induced adrenocortical insufficiency: Secondary | ICD-10-CM | POA: Diagnosis present

## 2020-04-01 DIAGNOSIS — W1830XA Fall on same level, unspecified, initial encounter: Secondary | ICD-10-CM | POA: Diagnosis present

## 2020-04-01 DIAGNOSIS — Z8262 Family history of osteoporosis: Secondary | ICD-10-CM

## 2020-04-01 DIAGNOSIS — K8689 Other specified diseases of pancreas: Secondary | ICD-10-CM | POA: Diagnosis present

## 2020-04-01 DIAGNOSIS — M069 Rheumatoid arthritis, unspecified: Secondary | ICD-10-CM | POA: Diagnosis not present

## 2020-04-01 DIAGNOSIS — Z7982 Long term (current) use of aspirin: Secondary | ICD-10-CM | POA: Diagnosis not present

## 2020-04-01 DIAGNOSIS — S7292XA Unspecified fracture of left femur, initial encounter for closed fracture: Secondary | ICD-10-CM

## 2020-04-01 DIAGNOSIS — M62838 Other muscle spasm: Secondary | ICD-10-CM | POA: Diagnosis present

## 2020-04-01 DIAGNOSIS — I252 Old myocardial infarction: Secondary | ICD-10-CM | POA: Diagnosis not present

## 2020-04-01 DIAGNOSIS — Z8249 Family history of ischemic heart disease and other diseases of the circulatory system: Secondary | ICD-10-CM

## 2020-04-01 DIAGNOSIS — Z83438 Family history of other disorder of lipoprotein metabolism and other lipidemia: Secondary | ICD-10-CM

## 2020-04-01 DIAGNOSIS — Z8349 Family history of other endocrine, nutritional and metabolic diseases: Secondary | ICD-10-CM

## 2020-04-01 DIAGNOSIS — M810 Age-related osteoporosis without current pathological fracture: Secondary | ICD-10-CM | POA: Diagnosis present

## 2020-04-01 DIAGNOSIS — Z833 Family history of diabetes mellitus: Secondary | ICD-10-CM

## 2020-04-01 DIAGNOSIS — M352 Behcet's disease: Secondary | ICD-10-CM | POA: Diagnosis present

## 2020-04-01 DIAGNOSIS — S72002A Fracture of unspecified part of neck of left femur, initial encounter for closed fracture: Principal | ICD-10-CM | POA: Diagnosis present

## 2020-04-01 DIAGNOSIS — K589 Irritable bowel syndrome without diarrhea: Secondary | ICD-10-CM | POA: Diagnosis present

## 2020-04-01 DIAGNOSIS — T148XXA Other injury of unspecified body region, initial encounter: Secondary | ICD-10-CM

## 2020-04-01 DIAGNOSIS — Z20822 Contact with and (suspected) exposure to covid-19: Secondary | ICD-10-CM | POA: Diagnosis not present

## 2020-04-01 DIAGNOSIS — Z8673 Personal history of transient ischemic attack (TIA), and cerebral infarction without residual deficits: Secondary | ICD-10-CM | POA: Diagnosis not present

## 2020-04-01 DIAGNOSIS — I776 Arteritis, unspecified: Secondary | ICD-10-CM | POA: Diagnosis present

## 2020-04-01 DIAGNOSIS — Z8672 Personal history of thrombophlebitis: Secondary | ICD-10-CM

## 2020-04-01 DIAGNOSIS — Y92009 Unspecified place in unspecified non-institutional (private) residence as the place of occurrence of the external cause: Secondary | ICD-10-CM | POA: Diagnosis not present

## 2020-04-01 DIAGNOSIS — I498 Other specified cardiac arrhythmias: Secondary | ICD-10-CM | POA: Diagnosis present

## 2020-04-01 DIAGNOSIS — Z79899 Other long term (current) drug therapy: Secondary | ICD-10-CM | POA: Diagnosis not present

## 2020-04-01 DIAGNOSIS — S7290XA Unspecified fracture of unspecified femur, initial encounter for closed fracture: Secondary | ICD-10-CM | POA: Diagnosis present

## 2020-04-01 DIAGNOSIS — Z7952 Long term (current) use of systemic steroids: Secondary | ICD-10-CM | POA: Diagnosis not present

## 2020-04-01 DIAGNOSIS — D573 Sickle-cell trait: Secondary | ICD-10-CM | POA: Diagnosis present

## 2020-04-01 DIAGNOSIS — M21379 Foot drop, unspecified foot: Secondary | ICD-10-CM | POA: Diagnosis present

## 2020-04-01 DIAGNOSIS — T380X5A Adverse effect of glucocorticoids and synthetic analogues, initial encounter: Secondary | ICD-10-CM | POA: Diagnosis present

## 2020-04-01 DIAGNOSIS — E2749 Other adrenocortical insufficiency: Secondary | ICD-10-CM | POA: Diagnosis not present

## 2020-04-01 DIAGNOSIS — Z7901 Long term (current) use of anticoagulants: Secondary | ICD-10-CM

## 2020-04-01 DIAGNOSIS — K219 Gastro-esophageal reflux disease without esophagitis: Secondary | ICD-10-CM | POA: Diagnosis present

## 2020-04-01 DIAGNOSIS — Z823 Family history of stroke: Secondary | ICD-10-CM

## 2020-04-01 DIAGNOSIS — G587 Mononeuritis multiplex: Secondary | ICD-10-CM | POA: Diagnosis present

## 2020-04-01 DIAGNOSIS — Y9301 Activity, walking, marching and hiking: Secondary | ICD-10-CM | POA: Diagnosis present

## 2020-04-01 DIAGNOSIS — Z96651 Presence of right artificial knee joint: Secondary | ICD-10-CM | POA: Diagnosis present

## 2020-04-01 DIAGNOSIS — Z981 Arthrodesis status: Secondary | ICD-10-CM | POA: Diagnosis not present

## 2020-04-01 LAB — GLUCOSE, CAPILLARY
Glucose-Capillary: 114 mg/dL — ABNORMAL HIGH (ref 70–99)
Glucose-Capillary: 126 mg/dL — ABNORMAL HIGH (ref 70–99)
Glucose-Capillary: 153 mg/dL — ABNORMAL HIGH (ref 70–99)

## 2020-04-01 LAB — BASIC METABOLIC PANEL
Anion gap: 6 (ref 5–15)
BUN: 13 mg/dL (ref 6–20)
CO2: 24 mmol/L (ref 22–32)
Calcium: 8.1 mg/dL — ABNORMAL LOW (ref 8.9–10.3)
Chloride: 110 mmol/L (ref 98–111)
Creatinine, Ser: 0.58 mg/dL (ref 0.44–1.00)
GFR, Estimated: >60 mL/min (ref >60)
Glucose, Bld: 104 mg/dL — ABNORMAL HIGH (ref 70–99)
Potassium: 3.4 mmol/L — ABNORMAL LOW (ref 3.5–5.1)
Sodium: 140 mmol/L (ref 135–145)

## 2020-04-01 LAB — CBC WITH DIFFERENTIAL/PLATELET
Abs Immature Granulocytes: 0.01 10*3/uL (ref 0.00–0.07)
Basophils Absolute: 0 10*3/uL (ref 0.0–0.1)
Basophils Relative: 1 %
Eosinophils Absolute: 0 10*3/uL (ref 0.0–0.5)
Eosinophils Relative: 1 %
HCT: 31.4 % — ABNORMAL LOW (ref 36.0–46.0)
Hemoglobin: 10.1 g/dL — ABNORMAL LOW (ref 12.0–15.0)
Immature Granulocytes: 0 %
Lymphocytes Relative: 31 %
Lymphs Abs: 2.1 10*3/uL (ref 0.7–4.0)
MCH: 30.5 pg (ref 26.0–34.0)
MCHC: 32.2 g/dL (ref 30.0–36.0)
MCV: 94.9 fL (ref 80.0–100.0)
Monocytes Absolute: 0.9 10*3/uL (ref 0.1–1.0)
Monocytes Relative: 14 %
Neutro Abs: 3.6 10*3/uL (ref 1.7–7.7)
Neutrophils Relative %: 53 %
Platelets: UNDETERMINED 10*3/uL (ref 150–400)
RBC: 3.31 MIL/uL — ABNORMAL LOW (ref 3.87–5.11)
RDW: 13.3 % (ref 11.5–15.5)
WBC: 6.7 10*3/uL (ref 4.0–10.5)
nRBC: 0 % (ref 0.0–0.2)

## 2020-04-01 LAB — RESP PANEL BY RT-PCR (FLU A&B, COVID) ARPGX2
Influenza A by PCR: NEGATIVE
Influenza B by PCR: NEGATIVE
SARS Coronavirus 2 by RT PCR: NEGATIVE

## 2020-04-01 LAB — HEMOGLOBIN A1C
Hgb A1c MFr Bld: 5.8 % — ABNORMAL HIGH (ref 4.8–5.6)
Mean Plasma Glucose: 119.76 mg/dL

## 2020-04-01 MED ORDER — PANTOPRAZOLE SODIUM 40 MG PO TBEC
40.0000 mg | DELAYED_RELEASE_TABLET | Freq: Every day | ORAL | Status: DC
  Administered 2020-04-01 – 2020-04-04 (×4): 40 mg via ORAL
  Filled 2020-04-01 (×4): qty 1

## 2020-04-01 MED ORDER — PROPRANOLOL HCL 80 MG PO TABS
80.0000 mg | ORAL_TABLET | Freq: Two times a day (BID) | ORAL | Status: DC
  Administered 2020-04-01 – 2020-04-04 (×7): 80 mg via ORAL
  Filled 2020-04-01: qty 1
  Filled 2020-04-01: qty 4
  Filled 2020-04-01: qty 1
  Filled 2020-04-01: qty 4
  Filled 2020-04-01: qty 1
  Filled 2020-04-01: qty 4
  Filled 2020-04-01: qty 1

## 2020-04-01 MED ORDER — FOLIC ACID 1 MG PO TABS
2.0000 mg | ORAL_TABLET | Freq: Every day | ORAL | Status: DC
  Administered 2020-04-01 – 2020-04-04 (×4): 2 mg via ORAL
  Filled 2020-04-01 (×4): qty 2

## 2020-04-01 MED ORDER — HYDROCORTISONE NA SUCCINATE PF 100 MG IJ SOLR
50.0000 mg | Freq: Four times a day (QID) | INTRAMUSCULAR | Status: DC
  Administered 2020-04-01 – 2020-04-03 (×8): 50 mg via INTRAVENOUS
  Filled 2020-04-01 (×8): qty 1

## 2020-04-01 MED ORDER — DULOXETINE HCL 60 MG PO CPEP
60.0000 mg | ORAL_CAPSULE | Freq: Every day | ORAL | Status: DC
  Administered 2020-04-01 – 2020-04-04 (×3): 60 mg via ORAL
  Filled 2020-04-01: qty 1
  Filled 2020-04-01: qty 2
  Filled 2020-04-01: qty 1

## 2020-04-01 MED ORDER — POTASSIUM CHLORIDE CRYS ER 20 MEQ PO TBCR
40.0000 meq | EXTENDED_RELEASE_TABLET | Freq: Once | ORAL | Status: AC
  Administered 2020-04-01: 40 meq via ORAL
  Filled 2020-04-01: qty 2

## 2020-04-01 MED ORDER — ACETAMINOPHEN 325 MG PO TABS: 650.0000 mg | ORAL_TABLET | Freq: Four times a day (QID) | ORAL | Status: DC | PRN

## 2020-04-01 MED ORDER — ONDANSETRON HCL 4 MG/2ML IJ SOLN
4.0000 mg | Freq: Three times a day (TID) | INTRAMUSCULAR | Status: DC | PRN
  Administered 2020-04-01: 4 mg via INTRAVENOUS
  Filled 2020-04-01 (×3): qty 2

## 2020-04-01 MED ORDER — LOPERAMIDE HCL 2 MG PO CAPS: 2.0000 mg | ORAL_CAPSULE | ORAL | Status: DC | PRN

## 2020-04-01 MED ORDER — COLCHICINE 0.6 MG PO TABS
0.6000 mg | ORAL_TABLET | Freq: Two times a day (BID) | ORAL | Status: DC
  Administered 2020-04-01 – 2020-04-04 (×6): 0.6 mg via ORAL
  Filled 2020-04-01 (×6): qty 1

## 2020-04-01 MED ORDER — HYDROMORPHONE HCL 1 MG/ML IJ SOLN
0.5000 mg | Freq: Once | INTRAMUSCULAR | Status: AC
  Administered 2020-04-01: 0.5 mg via INTRAVENOUS
  Filled 2020-04-01: qty 1

## 2020-04-01 MED ORDER — LEFLUNOMIDE 20 MG PO TABS
20.0000 mg | ORAL_TABLET | Freq: Every day | ORAL | Status: DC
  Administered 2020-04-01 – 2020-04-03 (×3): 20 mg via ORAL
  Filled 2020-04-01 (×4): qty 1

## 2020-04-01 MED ORDER — PANCRELIPASE (LIP-PROT-AMYL) 12000-38000 UNITS PO CPEP
120000.0000 [IU] | ORAL_CAPSULE | Freq: Three times a day (TID) | ORAL | Status: DC
  Administered 2020-04-01 – 2020-04-04 (×7): 120000 [IU] via ORAL
  Filled 2020-04-01 (×7): qty 10

## 2020-04-01 MED ORDER — HYDROMORPHONE HCL 1 MG/ML IJ SOLN
0.5000 mg | INTRAMUSCULAR | Status: DC | PRN
  Administered 2020-04-01 – 2020-04-02 (×10): 0.5 mg via INTRAVENOUS
  Filled 2020-04-01: qty 0.5
  Filled 2020-04-01: qty 1
  Filled 2020-04-01 (×8): qty 0.5

## 2020-04-01 MED ORDER — CALCIUM CARBONATE-VITAMIN D 500-200 MG-UNIT PO TABS
1.0000 | ORAL_TABLET | Freq: Three times a day (TID) | ORAL | Status: DC
  Administered 2020-04-01 – 2020-04-04 (×9): 1 via ORAL
  Filled 2020-04-01 (×9): qty 1

## 2020-04-01 MED ORDER — HYOSCYAMINE SULFATE 0.125 MG SL SUBL
0.1250 mg | SUBLINGUAL_TABLET | Freq: Four times a day (QID) | SUBLINGUAL | Status: DC | PRN
  Filled 2020-04-01: qty 1

## 2020-04-01 MED ORDER — CALCIUM CARBONATE ANTACID 500 MG PO CHEW
400.0000 mg | CHEWABLE_TABLET | Freq: Once | ORAL | Status: AC
  Administered 2020-04-01: 400 mg via ORAL
  Filled 2020-04-01: qty 2

## 2020-04-01 MED ORDER — METHOCARBAMOL 1000 MG/10ML IJ SOLN
500.0000 mg | Freq: Four times a day (QID) | INTRAVENOUS | Status: DC | PRN
  Filled 2020-04-01: qty 5

## 2020-04-01 MED ORDER — GABAPENTIN 100 MG PO CAPS
100.0000 mg | ORAL_CAPSULE | Freq: Three times a day (TID) | ORAL | Status: DC
  Administered 2020-04-01 (×3): 100 mg via ORAL
  Filled 2020-04-01 (×3): qty 1

## 2020-04-01 MED ORDER — METHOCARBAMOL 500 MG PO TABS
500.0000 mg | ORAL_TABLET | Freq: Four times a day (QID) | ORAL | Status: DC | PRN
  Administered 2020-04-01 – 2020-04-04 (×5): 500 mg via ORAL
  Filled 2020-04-01 (×5): qty 1

## 2020-04-01 MED ORDER — DIAZEPAM 5 MG PO TABS
5.0000 mg | ORAL_TABLET | Freq: Once | ORAL | Status: AC
  Administered 2020-04-01: 5 mg via ORAL
  Filled 2020-04-01: qty 1

## 2020-04-01 MED ORDER — ASPIRIN EC 325 MG PO TBEC
325.0000 mg | DELAYED_RELEASE_TABLET | Freq: Two times a day (BID) | ORAL | Status: DC
  Administered 2020-04-01 – 2020-04-04 (×5): 325 mg via ORAL
  Filled 2020-04-01 (×5): qty 1

## 2020-04-01 MED ORDER — ENOXAPARIN SODIUM 40 MG/0.4ML ~~LOC~~ SOLN
40.0000 mg | SUBCUTANEOUS | Status: DC
  Filled 2020-04-01: qty 0.4

## 2020-04-01 MED ORDER — INSULIN ASPART 100 UNIT/ML ~~LOC~~ SOLN
0.0000 [IU] | Freq: Three times a day (TID) | SUBCUTANEOUS | Status: DC
  Administered 2020-04-01: 1 [IU] via SUBCUTANEOUS

## 2020-04-01 NOTE — H&P (View-Only) (Signed)
ORTHOPAEDIC CONSULTATION  REQUESTING PHYSICIAN: Harold Hedge, MD  Chief Complaint: left hip/thigh pain  HPI: [name] is a 60 y.o. female with a history of adrenal insufficiency, chronic steroid use, POTS, MI, CVA, pituitary adenoma, osteoporosis who complains of left hip pain after a fall at home overnight. She got up to let her dog go outside and while walking to the door she felt a sudden pop in her left hip and said her leg twisted and spun her around until she landed on the ground. She did not have immediate pain but wasn't able to get up off the ground. She hasn't put any weight on the left leg since she fell. She reports she is now in pain and having a lot of muscle spasms with any leg movement. For several weeks prior to this fall, she has been having left thigh pain and reports that often times when she was walking she would feel sharp stabbing pains in the left thigh but didn't think anything of it. She reports the only other fracture in her lifetime was in her toe.   Imaging shows: There is an oblique, displaced, angulated, and foreshortened fracture of the most proximal left femoral diaphysis. In general, this location and reported low energy mechanism raise high suspicion for pathologic fracture; there is no obvious evidence of osseous lesion by radiographs.    Orthopedics was consulted for evaluation.   Last meal was midnight. Has a history of MI and CVA. No history of DVT or PE.  Previously ambulatory without the use of assistive devices. The patient is living at home with her son.    Past Medical History:  Diagnosis Date  . Acid reflux   . Adrenal insufficiency ([place])   . Anxiety    when she gets sick she gets anxious  . Arrhythmia   . Arthritis    inflammatory arthritis  . Asthma   . Behcet's syndrome ([place])   . Chronic back pain   . Chronic neck pain   . Colitis   . Connective tissue disorder ([place])   . Depression   . Dysrhythmia    POTS  syndrome  . Endometritis   . Foot drop    due to muscle weakness from immunosuppressants  . GERD (gastroesophageal reflux disease)   . Gluten intolerance   . Heart murmur   . IBS (irritable bowel syndrome)   . Myocardial infarction ([place])    per pt, mild mi due to immunosuppressant therapy  . Nasal ulcer    gets occasionally from medications  . Osteoarthritis   . Osteoporosis    T score reported -2.7  . Pneumonia    20 years ago  . PONV (postoperative nausea and vomiting)    also low blood pressure at times. do not use lactated ringers. must have sugar with saline d/t adrenal insufficiency  . Refusal of blood transfusions as patient is Jehovah's Witness   . Renal cyst 2019  . Sickle cell trait ([place])   . Stroke [facility]) 2014   small peripheral stroke. Mononeuritis multiplex  . Subjective visual disturbance 07/14/2012  . Superficial phlebitis and thrombophlebitis of both lower extremities 2019  . Tear of lateral meniscus of right knee, current    Tear of lateral & medial meniscus of right knee  . Tinnitus    left ear sees Dr Lucia Gaskins (ENT)  . Uveitis   . Visual changes 2019   left eye vision only with distance, right eye has floater.  vascular changes   Past Surgical History:  Procedure Laterality Date  . ANTERIOR CERVICAL DECOMP/DISCECTOMY FUSION N/A 08/22/2013   Procedure: ANTERIOR CERVICAL DECOMPRESSION/DISCECTOMY FUSION 2 LEVELS;  Surgeon: Floyce Stakes, MD;  Location: [facility];  Service: Neurosurgery;  Laterality: N/A;  C4-5 C5-6 Anterior cervical decompression/diskectomy/fusion  . ANTERIOR FUSION CERVICAL SPINE  08/22/2013   c4 5  6         . AXILLARY SURGERY    . BACK SURGERY    . BREAST EXCISIONAL BIOPSY Right 1986  . BREAST SURGERY     Cysts excised  . CARDIAC CATHETERIZATION  09/18/2013  . CESAREAN SECTION  03/2001   x 1  . CHOLECYSTECTOMY  12/2004  . COLONOSCOPY  03/2013  . DILATION AND CURETTAGE OF UTERUS    . EXCISION OF BREAST LESION Right 08/18/2017    Procedure: EXCISION OF RIGHT AXILLARY SOFT TISSUE MASS;  Surgeon: Herbert Pun, MD;  Location: [facility] ORS;  Service: General;  Laterality: Right;  . HAND SURGERY Right 07/2001   dupytren  . HYSTEROSCOPY    . IR SI JOINT INJ / [place] LEFT W/IMAG GUIDE  2018   si fusion , left   . IR SI JOINT INJ / [place] RIGHT W/IMAG GUIDE Right 2018  . JOINT REPLACEMENT Right 11/2016   partial knee replacement  . KNEE ARTHROSCOPY WITH LATERAL MENISECTOMY Right 12/12/2012   Procedure: KNEE ARTHROSCOPY WITH LATERAL MENISECTOMY;  Surgeon: Lorn Junes, MD;  Location: [place];  Service: Orthopedics;  Laterality: Right;  . KNEE SURGERY Left 05/1998  . LEFT HEART CATHETERIZATION WITH CORONARY ANGIOGRAM N/A 09/18/2013   Procedure: LEFT HEART CATHETERIZATION WITH CORONARY ANGIOGRAM;  Surgeon: Blane Ohara, MD;  Location: [facility] CATH LAB;  Service: Cardiovascular;  Laterality: N/A;  . LUMBAR [place]    . OOPHORECTOMY     BSO  . PELVIC LAPAROSCOPY     DL  . SPINE SURGERY    . SUBMANDIBULAR MASS EXCISION    . THYROID CYST EXCISION  1983   thyroglossal duct cyst  . VAGINAL HYSTERECTOMY  07/2007   LAVH BSO   Social History   Socioeconomic History  . Marital status: Divorced    Spouse name: Not on file  . Number of children: 1  . Years of education: Not on file  . Highest education level: Master's degree (e.g., MA, MS, MEng, MEd, MSW, MBA)  Occupational History  . Occupation: disabled  Tobacco Use  . Smoking status: Never Smoker  . Smokeless tobacco: Never Used  Vaping Use  . Vaping Use: Never used  Substance and Sexual Activity  . Alcohol use: No    Alcohol/week: 0.0 standard drinks  . Drug use: No  . Sexual activity: Not Currently    Birth control/protection: Surgical    Comment: 1st intercourse 22 yo-5 partners  Other Topics Concern  . Not on file  Social History Narrative  . Not on file   Social Determinants of Health   Financial Resource Strain: Not on file   Food Insecurity: Not on file  Transportation Needs: Not on file  Physical Activity: Not on file  Stress: Not on file  Social Connections: Not on file   Family History  Problem Relation Age of Onset  . Diabetes Mother   . Breast cancer Mother        Age 40's  . Stroke Mother   . Depression Mother   . Hyperlipidemia Mother   . Gastric cancer Father   .  Diabetes Sister   . Hypertension Sister   . Breast cancer Sister        Age 28  . Hyperlipidemia Sister   . Thyroid disease Sister   . CAD Maternal Grandfather   . Polymyositis Maternal Aunt   . Myasthenia gravis Maternal Aunt   . Lupus Cousin   . Rheum arthritis Cousin   . Skin cancer Maternal Aunt   . Prostate cancer Paternal Uncle    Allergies  Allergen Reactions  . Celebrex [Celecoxib] Other (See Comments)    Bleeding/bruising including bleeding for multiple days This is a reaction d/t sulfa product  . Fentanyl Nausea And Vomiting    Had mild heart attack after last surgery along with vomiting severely. Does NOT want fentanyl at all.  Versed and valium are okay  . Latex Shortness Of Breath and Dermatitis  . Sulfa Drugs Cross Reactors Other (See Comments)    Eyes turn red, renal failure, stomach problems (vomiting, diarrhea), vasculature collapses, migraine like symptoms  . Sulfites Other (See Comments)    Eyes turn red, renal failure, stomach problems (vomiting, diarrhea), vasculature collapses, migraine like symptoms  . Simponi [Golimumab] Swelling    Simponi Aria- Headache, blurred vision. (similar to remicade, but uses human dna)  . Spiractazide [Hydrochlorothiazide W-Spironolactone] Other (See Comments)    Made blood pressure rise  . Adhesive [Tape] Other (See Comments)    Pulls skin right off.  PAPER TAPE is okay. tegaderm is also okay  . Codeine Itching and Nausea And Vomiting    Tolerates hydrocodone   . Epinephrine Other (See Comments)    Tachycardia (this is a normal side effect of this medication)  .  Erythromycin Itching and Rash    Has tolerated azithromycin  . Loratadine Itching  . Morphine And Related Itching and Rash   Prior to Admission medications   Medication Sig Start Date End Date Taking? Authorizing Provider  acetaminophen (TYLENOL) 500 MG tablet Take 500 mg by mouth 2 (two) times daily.   Yes [provider]  Adalimumab 40 MG/0.8ML PNKT Inject 40 mg into the skin every Saturday.   Yes [provider]  Aloe-Sodium Chloride (AYR SALINE NASAL GEL NA) Place 1 spray into the nose daily as needed (congestion).   Yes [provider]  aspirin EC 325 MG tablet Take 325 mg by mouth 2 (two) times daily.    Yes [provider]  Calcium Carb-Cholecalciferol (CALCIUM/VITAMIN D) 600-400 MG-UNIT TABS Take 1 tablet by mouth 3 (three) times daily.   Yes [provider]  colchicine 0.6 MG tablet Take 0.6 mg by mouth 2 (two) times daily. Colcrys 12/19/10  Yes Hensel, William A, MD  diphenhydrAMINE (BENADRYL) 25 mg capsule Take 25 mg by mouth at bedtime.   Yes [provider]  DULoxetine (CYMBALTA) 60 MG capsule Take 60 mg by mouth daily.   Yes [provider]  fluocinonide cream (LIDEX) 0.05 % Apply 1 application topically daily as needed (rash).  05/07/15  Yes [provider]  fluticasone (FLONASE) 50 MCG/ACT nasal spray Place 1 spray into the nose daily as needed for allergies.    Yes [provider]  folic acid (FOLVITE) 1 MG tablet Take 2 mg by mouth daily. In the afternoon 05/01/19  Yes [provider]  gabapentin (NEURONTIN) 100 MG capsule Take 100 mg by mouth in the morning, at noon, and at bedtime. 02/27/19  Yes [provider]  hyoscyamine (LEVSIN SL) 0.125 MG SL tablet Place 1 tablet (  0.125 mg total) under the tongue every 6 (six) hours as needed. 03/14/19  Yes Danis, Henry L III, MD  lansoprazole (PREVACID) 30 MG capsule Take 30 mg by mouth daily. 07/17/19  Yes [provider]  leflunomide  (ARAVA) 20 MG tablet Take 20 mg by mouth daily.   Yes [provider]  loperamide (IMODIUM) 2 MG capsule Take 1 capsule (2 mg total) by mouth as needed for diarrhea or loose stools. Patient taking differently: Take 2-4 mg by mouth See admin instructions. Take 2 capsules (4mg) at onset of diarrhea, and 1 capsule (2mg) every 4 hours after until symptoms subside 02/16/19  Yes Akula, Vijaya, MD  Magnesium 500 MG CAPS Take 500 mg by mouth 3 (three) times daily.   Yes [provider]  Multiple Vitamins-Minerals (BARIATRIC MULTIVITAMINS/IRON PO) Take 1 tablet by mouth daily.   Yes [provider]  Oral Electrolytes (THERMOTABS PO) Take 2 tablets by mouth daily as needed (hydration). May take an additional 2 more 2 tablet doses as needed for physical activity or heat   Yes [provider]  OVER THE COUNTER MEDICATION Place 1 drop into both eyes as needed (dry eyes). Crocodile tears otc eye drops   Yes [provider]  OVER THE COUNTER MEDICATION Swish and swallow 15 mLs 2 (two) times daily as needed (ulcers). Maalox mixed with liquid children's benadryl 3:1 ratio   Yes [provider]  Pancrelipase, Lip-Prot-Amyl, 24000-76000 units CPEP Take 3-5 capsules by mouth See admin instructions. Take 5 capsules by mouth with meals and 3 capsules with snacks 06/29/19  Yes [provider]  potassium chloride SA (KLOR-CON) 20 MEQ tablet Take 20 mEq by mouth 3 (three) times daily.  04/28/19 04/27/20 Yes [provider]  predniSONE (DELTASONE) 10 MG tablet Take 10 mg by mouth daily. 01/18/20  Yes [provider]  predniSONE (DELTASONE) 2.5 MG tablet Take 2.5 mg by mouth daily as needed (bad Bechet's pain days. No more than 2 dose per week up to 30 days). Bad Bechet's pain days, take sparingly for up to 30 days 03/25/20 04/24/20 Yes [provider]  propranolol (INDERAL) 80 MG tablet TAKE 1 TABLET BY MOUTH TWICE DAILY. REPLACES  METOPROLOL Patient taking differently: Take 80 mg by mouth 2 (two) times daily. 10/06/19  Yes Croitoru, Mihai, MD   DG Chest 1 View  Result Date: 04/01/2020 CLINICAL DATA:  Hip fracture EXAM: CHEST  1 VIEW COMPARISON:  02/15/2019 FINDINGS: Normal heart size and mediastinal contours. There is no edema, consolidation, effusion, or pneumothorax. No acute osseous finding. ACDF. IMPRESSION: No evidence of active disease. Electronically Signed   By: Jonathon  Watts M.D.   On: 04/01/2020 08:03   DG Hip Unilat W or Wo Pelvis 2-3 Views Left  Result Date: 04/01/2020 CLINICAL DATA:  Fracture EXAM: DG HIP (WITH OR WITHOUT PELVIS) 2-3V LEFT COMPARISON:  None. FINDINGS: There is an oblique, displaced, angulated, and foreshortened fracture of the most proximal left femoral diaphysis. There is widening of the pubic symphysis. The partially imaged proximal right femur is intact. Postoperative findings of lower lumbar fusion and bilateral sacroiliac fusion. IMPRESSION: 1. There is an oblique, displaced, angulated, and foreshortened fracture of the most proximal left femoral diaphysis. In general, this location and reported low energy mechanism raise high suspicion for pathologic fracture; there is no obvious evidence of osseous lesion by radiographs. 2. There is widening of the pubic symphysis, concerning for additional injury. Electronically Signed   By: Alex  Bibbey M.D.     On: 04/01/2020 08:07    Positive ROS: All other systems have been reviewed and were otherwise negative with the exception of those mentioned in the HPI and as above.  Objective: Labs cbc Recent Labs    04/01/20 0719  WBC 6.7  HGB 10.1*  HCT 31.4*  PLT PLATELET CLUMPS NOTED ON SMEAR, UNABLE TO ESTIMATE    Labs inflam No results for input(s): CRP in the last 72 hours.  Invalid input(s): ESR  Labs coag No results for input(s): INR, PTT in the last 72 hours.  Invalid input(s): PT  Recent Labs    04/01/20 0719  NA 140  K 3.4*  CL  110  CO2 24  GLUCOSE 104*  BUN 13  CREATININE 0.58  CALCIUM 8.1*    Physical Exam: Vitals:   04/01/20 1441 04/01/20 1723  BP: 119/74 139/85  Pulse: 68 67  Resp: 17 18  Temp: 98.3 F (36.8 C) 98.4 F (36.9 C)  SpO2: 97% 97%   General: Alert, no acute distress. Laying flat in bed. In Buck's traction LLE. Talkative. Dozing off and on Mental status: Alert and Oriented x3 Neurologic: Speech Clear and organized, no gross focal findings or movement disorder appreciated. Respiratory: No cyanosis, no use of accessory musculature, Lungs CTAB Cardiovascular: RRR GI: Abdomen is soft and non-tender, non-distended. Skin: Warm and dry.  No lesions in the area of chief complaint. No bruising. Extremities: Warm and well perfused w/o edema Psychiatric: Patient is competent for consent with normal mood and affect  MUSCULOSKELETAL:  LLE is externally rotated and shortened. Is in Buck's traction. Anterior thigh is very TTP. Dorsiflexion and plantarflexion intact. Can wiggle her toes. NVI  Other extremities are atraumatic with painless ROM and NVI.  Assessment / Plan: Principal Problem:   Femur fracture (HCC) Active Problems:   Behcet's disease (HCC)   IBS (irritable bowel syndrome)   Iatrogenic adrenal insufficiency (HCC)   Pancreatic insufficiency   Will plan for Left femoral IM nail placement in the OR tomorrow 3/29  For now: Weightbearing: NWB LLE Orthopedic device(s): Buck's traction VTE prophylaxis: Aspirin 325mg BID already taking at home. Will add Xarelto post op x 30 days Pain control: Gabapentin, Tylenol, Dilaudid PRN Follow - up plan: 7-10 days after discharge, in the office Contact information:  Timothy Murphy MD, Graysin Luczynski PA-C  Aleese Kamps M Shakemia Madera PA-C Office [phone] 04/01/2020 5:28 PM 

## 2020-04-01 NOTE — Consult Note (Signed)
ORTHOPAEDIC CONSULTATION  REQUESTING PHYSICIAN: Harold Hedge, MD  Chief Complaint: left hip/thigh pain  HPI: [name] is a 60 y.o. female with a history of adrenal insufficiency, chronic steroid use, POTS, MI, CVA, pituitary adenoma, osteoporosis who complains of left hip pain after a fall at home overnight. She got up to let her dog go outside and while walking to the door she felt a sudden pop in her left hip and said her leg twisted and spun her around until she landed on the ground. She did not have immediate pain but wasn't able to get up off the ground. She hasn't put any weight on the left leg since she fell. She reports she is now in pain and having a lot of muscle spasms with any leg movement. For several weeks prior to this fall, she has been having left thigh pain and reports that often times when she was walking she would feel sharp stabbing pains in the left thigh but didn't think anything of it. She reports the only other fracture in her lifetime was in her toe.   Imaging shows: There is an oblique, displaced, angulated, and foreshortened fracture of the most proximal left femoral diaphysis. In general, this location and reported low energy mechanism raise high suspicion for pathologic fracture; there is no obvious evidence of osseous lesion by radiographs.    Orthopedics was consulted for evaluation.   Last meal was midnight. Has a history of MI and CVA. No history of DVT or PE.  Previously ambulatory without the use of assistive devices. The patient is living at home with her son.    Past Medical History:  Diagnosis Date  . Acid reflux   . Adrenal insufficiency ([place])   . Anxiety    when she gets sick she gets anxious  . Arrhythmia   . Arthritis    inflammatory arthritis  . Asthma   . Behcet's syndrome ([place])   . Chronic back pain   . Chronic neck pain   . Colitis   . Connective tissue disorder ([place])   . Depression   . Dysrhythmia    POTS  syndrome  . Endometritis   . Foot drop    due to muscle weakness from immunosuppressants  . GERD (gastroesophageal reflux disease)   . Gluten intolerance   . Heart murmur   . IBS (irritable bowel syndrome)   . Myocardial infarction ([place])    per pt, mild mi due to immunosuppressant therapy  . Nasal ulcer    gets occasionally from medications  . Osteoarthritis   . Osteoporosis    T score reported -2.7  . Pneumonia    20 years ago  . PONV (postoperative nausea and vomiting)    also low blood pressure at times. do not use lactated ringers. must have sugar with saline d/t adrenal insufficiency  . Refusal of blood transfusions as patient is Jehovah's Witness   . Renal cyst 2019  . Sickle cell trait ([place])   . Stroke [facility]) 2014   small peripheral stroke. Mononeuritis multiplex  . Subjective visual disturbance 07/14/2012  . Superficial phlebitis and thrombophlebitis of both lower extremities 2019  . Tear of lateral meniscus of right knee, current    Tear of lateral & medial meniscus of right knee  . Tinnitus    left ear sees Dr Lucia Gaskins (ENT)  . Uveitis   . Visual changes 2019   left eye vision only with distance, right eye has floater.  vascular changes   Past Surgical History:  Procedure Laterality Date  . ANTERIOR CERVICAL DECOMP/DISCECTOMY FUSION N/A 08/22/2013   Procedure: ANTERIOR CERVICAL DECOMPRESSION/DISCECTOMY FUSION 2 LEVELS;  Surgeon: Floyce Stakes, MD;  Location: [facility];  Service: Neurosurgery;  Laterality: N/A;  C4-5 C5-6 Anterior cervical decompression/diskectomy/fusion  . ANTERIOR FUSION CERVICAL SPINE  08/22/2013   c4 5  6         . AXILLARY SURGERY    . BACK SURGERY    . BREAST EXCISIONAL BIOPSY Right 1986  . BREAST SURGERY     Cysts excised  . CARDIAC CATHETERIZATION  09/18/2013  . CESAREAN SECTION  03/2001   x 1  . CHOLECYSTECTOMY  12/2004  . COLONOSCOPY  03/2013  . DILATION AND CURETTAGE OF UTERUS    . EXCISION OF BREAST LESION Right 08/18/2017    Procedure: EXCISION OF RIGHT AXILLARY SOFT TISSUE MASS;  Surgeon: Herbert Pun, MD;  Location: [facility] ORS;  Service: General;  Laterality: Right;  . HAND SURGERY Right 07/2001   dupytren  . HYSTEROSCOPY    . IR SI JOINT INJ / [place] LEFT W/IMAG GUIDE  2018   si fusion , left   . IR SI JOINT INJ / [place] RIGHT W/IMAG GUIDE Right 2018  . JOINT REPLACEMENT Right 11/2016   partial knee replacement  . KNEE ARTHROSCOPY WITH LATERAL MENISECTOMY Right 12/12/2012   Procedure: KNEE ARTHROSCOPY WITH LATERAL MENISECTOMY;  Surgeon: Lorn Junes, MD;  Location: [facility];  Service: Orthopedics;  Laterality: Right;  . KNEE SURGERY Left 05/1998  . LEFT HEART CATHETERIZATION WITH CORONARY ANGIOGRAM N/A 09/18/2013   Procedure: LEFT HEART CATHETERIZATION WITH CORONARY ANGIOGRAM;  Surgeon: Blane Ohara, MD;  Location: [facility] CATH LAB;  Service: Cardiovascular;  Laterality: N/A;  . LUMBAR Stella    . OOPHORECTOMY     BSO  . PELVIC LAPAROSCOPY     DL  . SPINE SURGERY    . SUBMANDIBULAR MASS EXCISION    . THYROID CYST EXCISION  1983   thyroglossal duct cyst  . VAGINAL HYSTERECTOMY  07/2007   LAVH BSO   Social History   Socioeconomic History  . Marital status: Divorced    Spouse name: Not on file  . Number of children: 1  . Years of education: Not on file  . Highest education level: Master's degree (e.g., MA, MS, MEng, MEd, MSW, MBA)  Occupational History  . Occupation: disabled  Tobacco Use  . Smoking status: Never Smoker  . Smokeless tobacco: Never Used  Vaping Use  . Vaping Use: Never used  Substance and Sexual Activity  . Alcohol use: No    Alcohol/week: 0.0 standard drinks  . Drug use: No  . Sexual activity: Not Currently    Birth control/protection: Surgical    Comment: 1st intercourse 22 yo-5 partners  Other Topics Concern  . Not on file  Social History Narrative  . Not on file   Social Determinants of Health   Financial Resource Strain: Not on file   Food Insecurity: Not on file  Transportation Needs: Not on file  Physical Activity: Not on file  Stress: Not on file  Social Connections: Not on file   Family History  Problem Relation Age of Onset  . Diabetes Mother   . Breast cancer Mother        Age 49's  . Stroke Mother   . Depression Mother   . Hyperlipidemia Mother   . Gastric cancer Father   .  Diabetes Sister   . Hypertension Sister   . Breast cancer Sister        Age 56  . Hyperlipidemia Sister   . Thyroid disease Sister   . CAD Maternal Grandfather   . Polymyositis Maternal Aunt   . Myasthenia gravis Maternal Aunt   . Lupus Cousin   . Rheum arthritis Cousin   . Skin cancer Maternal Aunt   . Prostate cancer Paternal Uncle    Allergies  Allergen Reactions  . Celebrex [Celecoxib] Other (See Comments)    Bleeding/bruising including bleeding for multiple days This is a reaction d/t sulfa product  . Fentanyl Nausea And Vomiting    Had mild heart attack after last surgery along with vomiting severely. Does NOT want fentanyl at all.  Versed and valium are okay  . Latex Shortness Of Breath and Dermatitis  . Sulfa Drugs Cross Reactors Other (See Comments)    Eyes turn red, renal failure, stomach problems (vomiting, diarrhea), vasculature collapses, migraine like symptoms  . Sulfites Other (See Comments)    Eyes turn red, renal failure, stomach problems (vomiting, diarrhea), vasculature collapses, migraine like symptoms  . Simponi [Golimumab] Swelling    Simponi Aria- Headache, blurred vision. (similar to remicade, but uses human dna)  . Spiractazide [Hydrochlorothiazide W-Spironolactone] Other (See Comments)    Made blood pressure rise  . Adhesive [Tape] Other (See Comments)    Pulls skin right off.  PAPER TAPE is okay. tegaderm is also okay  . Codeine Itching and Nausea And Vomiting    Tolerates hydrocodone   . Epinephrine Other (See Comments)    Tachycardia (this is a normal side effect of this medication)  .  Erythromycin Itching and Rash    Has tolerated azithromycin  . Loratadine Itching  . Morphine And Related Itching and Rash   Prior to Admission medications   Medication Sig Start Date End Date Taking? Authorizing Provider  acetaminophen (TYLENOL) 500 MG tablet Take 500 mg by mouth 2 (two) times daily.   Yes [provider]  Adalimumab 40 MG/0.8ML PNKT Inject 40 mg into the skin every Saturday.   Yes [provider]  Aloe-Sodium Chloride (AYR SALINE NASAL GEL NA) Place 1 spray into the nose daily as needed (congestion).   Yes [provider]  aspirin EC 325 MG tablet Take 325 mg by mouth 2 (two) times daily.    Yes [provider]  Calcium Carb-Cholecalciferol (CALCIUM/VITAMIN D) 600-400 MG-UNIT TABS Take 1 tablet by mouth 3 (three) times daily.   Yes [provider]  colchicine 0.6 MG tablet Take 0.6 mg by mouth 2 (two) times daily. Colcrys 12/19/10  Yes Hensel, Jamal Collin, MD  diphenhydrAMINE (BENADRYL) 25 mg capsule Take 25 mg by mouth at bedtime.   Yes [provider]  DULoxetine (CYMBALTA) 60 MG capsule Take 60 mg by mouth daily.   Yes [provider]  fluocinonide cream (LIDEX) 8.84 % Apply 1 application topically daily as needed (rash).  05/07/15  Yes [provider]  fluticasone (FLONASE) 50 MCG/ACT nasal spray Place 1 spray into the nose daily as needed for allergies.    Yes [provider]  folic acid (FOLVITE) 1 MG tablet Take 2 mg by mouth daily. In the afternoon 05/01/19  Yes [provider]  gabapentin (NEURONTIN) 100 MG capsule Take 100 mg by mouth in the morning, at noon, and at bedtime. 02/27/19  Yes [provider]  hyoscyamine (LEVSIN SL) 0.125 MG SL tablet Place 1 tablet (  0.125 mg total) under the tongue every 6 (six) hours as needed. 03/14/19  Yes Danis, Kirke Corin, MD  lansoprazole (PREVACID) 30 MG capsule Take 30 mg by mouth daily. 07/17/19  Yes [provider]  leflunomide  (ARAVA) 20 MG tablet Take 20 mg by mouth daily.   Yes [provider]  loperamide (IMODIUM) 2 MG capsule Take 1 capsule (2 mg total) by mouth as needed for diarrhea or loose stools. Patient taking differently: Take 2-4 mg by mouth See admin instructions. Take 2 capsules (74m) at onset of diarrhea, and 1 capsule (259m every 4 hours after until symptoms subside 02/16/19  Yes AkHosie PoissonMD  Magnesium 500 MG CAPS Take 500 mg by mouth 3 (three) times daily.   Yes [provider]  Multiple Vitamins-Minerals (BARIATRIC MULTIVITAMINS/IRON PO) Take 1 tablet by mouth daily.   Yes [provider]  Oral Electrolytes (THERMOTABS PO) Take 2 tablets by mouth daily as needed (hydration). May take an additional 2 more 2 tablet doses as needed for physical activity or heat   Yes [provider]  OVER THE COUNTER MEDICATION Place 1 drop into both eyes as needed (dry eyes). Crocodile tears otc eye drops   Yes [provider]  OVER THE COUNTER MEDICATION Swish and swallow 15 mLs 2 (two) times daily as needed (ulcers). Maalox mixed with liquid children's benadryl 3:1 ratio   Yes [provider]  Pancrelipase, Lip-Prot-Amyl, 24000-76000 units CPEP Take 3-5 capsules by mouth See admin instructions. Take 5 capsules by mouth with meals and 3 capsules with snacks 06/29/19  Yes [provider]  potassium chloride SA (KLOR-CON) 20 MEQ tablet Take 20 mEq by mouth 3 (three) times daily.  04/28/19 04/27/20 Yes [provider]  predniSONE (DELTASONE) 10 MG tablet Take 10 mg by mouth daily. 01/18/20  Yes [provider]  predniSONE (DELTASONE) 2.5 MG tablet Take 2.5 mg by mouth daily as needed (bad Bechet's pain days. No more than 2 dose per week up to 30 days). Bad Bechet's pain days, take sparingly for up to 30 days 03/25/20 04/24/20 Yes [provider]  propranolol (INDERAL) 80 MG tablet TAKE 1 TABLET BY MOUTH TWICE DAILY. REPLACES  METOPROLOL Patient taking differently: Take 80 mg by mouth 2 (two) times daily. 10/06/19  Yes Croitoru, MiDani GobbleMD   DG Chest 1 View  Result Date: 04/01/2020 CLINICAL DATA:  Hip fracture EXAM: CHEST  1 VIEW COMPARISON:  02/15/2019 FINDINGS: Normal heart size and mediastinal contours. There is no edema, consolidation, effusion, or pneumothorax. No acute osseous finding. ACDF. IMPRESSION: No evidence of active disease. Electronically Signed   By: JoMonte Fantasia.D.   On: 04/01/2020 08:03   DG Hip Unilat W or Wo Pelvis 2-3 Views Left  Result Date: 04/01/2020 CLINICAL DATA:  Fracture EXAM: DG HIP (WITH OR WITHOUT PELVIS) 2-3V LEFT COMPARISON:  None. FINDINGS: There is an oblique, displaced, angulated, and foreshortened fracture of the most proximal left femoral diaphysis. There is widening of the pubic symphysis. The partially imaged proximal right femur is intact. Postoperative findings of lower lumbar fusion and bilateral sacroiliac fusion. IMPRESSION: 1. There is an oblique, displaced, angulated, and foreshortened fracture of the most proximal left femoral diaphysis. In general, this location and reported low energy mechanism raise high suspicion for pathologic fracture; there is no obvious evidence of osseous lesion by radiographs. 2. There is widening of the pubic symphysis, concerning for additional injury. Electronically Signed   By: AlDorna Bloom.  On: 04/01/2020 08:07    Positive ROS: All other systems have been reviewed and were otherwise negative with the exception of those mentioned in the HPI and as above.  Objective: Labs cbc Recent Labs    04/01/20 0719  WBC 6.7  HGB 10.1*  HCT 31.4*  PLT PLATELET CLUMPS NOTED ON SMEAR, UNABLE TO ESTIMATE    Labs inflam No results for input(s): CRP in the last 72 hours.  Invalid input(s): ESR  Labs coag No results for input(s): INR, PTT in the last 72 hours.  Invalid input(s): PT  Recent Labs    04/01/20 0719  NA 140  K 3.4*  CL  110  CO2 24  GLUCOSE 104*  BUN 13  CREATININE 0.58  CALCIUM 8.1*    Physical Exam: Vitals:   04/01/20 1441 04/01/20 1723  BP: 119/74 139/85  Pulse: 68 67  Resp: 17 18  Temp: 98.3 F (36.8 C) 98.4 F (36.9 C)  SpO2: 97% 97%   General: Alert, no acute distress. Laying flat in bed. In Buck's traction LLE. Talkative. Dozing off and on Mental status: Alert and Oriented x3 Neurologic: Speech Clear and organized, no gross focal findings or movement disorder appreciated. Respiratory: No cyanosis, no use of accessory musculature, Lungs CTAB Cardiovascular: RRR GI: Abdomen is soft and non-tender, non-distended. Skin: Warm and dry.  No lesions in the area of chief complaint. No bruising. Extremities: Warm and well perfused w/o edema Psychiatric: Patient is competent for consent with normal mood and affect  MUSCULOSKELETAL:  LLE is externally rotated and shortened. Is in Buck's traction. Anterior thigh is very TTP. Dorsiflexion and plantarflexion intact. Can wiggle her toes. NVI  Other extremities are atraumatic with painless ROM and NVI.  Assessment / Plan: Principal Problem:   Femur fracture (HCC) Active Problems:   Behcet's disease (H[facility])   IBS (irritable bowel syndrome)   Iatrogenic adrenal insufficiency (H[facility])   Pancreatic insufficiency   Will plan for Left femoral IM nail placement in the OR tomorrow 3/29  For now: Weightbearing: NWB LLE Orthopedic device(s): Buck's traction VTE prophylaxis: Aspirin 346m BID already taking at home. Will add Xarelto post op x 30 days Pain control: Gabapentin, Tylenol, Dilaudid PRN Follow - up plan: 7-10 days after discharge, in the office Contact information:  TEdmonia LynchMD, MJohnson City Eye Surgery CenterPA-C  MBritt BottomPA-C Office [phone]/28/2022 5:28 PM

## 2020-04-01 NOTE — H&P (Addendum)
History and Physical        Hospital Admission Note Date: 04/01/2020  Patient name: [name] Medical record number: [id number] Date of birth: 20-Aug-1960 Age: 60 y.o. Gender: female  PCP: Gardner Candle, MD   Chief Complaint    Chief Complaint  Patient presents with  . Fall  . 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  Patient went to let her dog outside and while walking she suddenly stopped.  When she did this she felt a pop in her left leg and fell to the ground.  No LOC, no head injury.  Last took prednisone at 9 am yesterday. She has intolerance to morphine and fentanyl and therefore was given ketamine in route by EMS.  Currently with complaints of muscle spasms in her leg.  Admits to intact sensation of her left foot.  No fever or chills or any other complaints.   ED Course: Afebrile, hemodynamically stable, on room air. Notable Labs: Sodium 140, K3.4, glucose 104, BUN 13, creatinine 0.58, calcium 8.1, WBC 6.7, Hb 10.1, COVID-19 pending. Notable Imaging: Left hip XR-oblique, displaced, angulated and foreshortened fracture of proximal left femoral diaphysis concerning for pathologic fracture and widening of pubic symphysis concerning for additional injury. Patient received Valium 5 mg, Dilaudid 0.5 mg x 3, KCl 40 mEq, calcium carbonate p.o.    Vitals:    04/01/20 0730 04/01/20 0927  BP: 100/82 127/77  Pulse: 70 75  Resp: 18 18  Temp:    SpO2: 96% 95%     Review of Systems:  Review of Systems  All other systems reviewed and are negative.   Medical/Social/Family History   Past Medical History: Past Medical History:  Diagnosis Date  . Acid reflux   . Adrenal insufficiency (HCC)   . Anxiety    when she gets sick she gets anxious  . Arrhythmia   . Arthritis    inflammatory arthritis  . Asthma   . Behcet's syndrome (HCC)   . Chronic back pain   . Chronic neck pain   . Colitis   . Connective tissue disorder (HCC)   . Depression   . Dysrhythmia    POTS syndrome  . Endometritis   . Foot drop    due to muscle weakness from immunosuppressants  . GERD (gastroesophageal reflux disease)   . Gluten intolerance   . Heart murmur   . IBS (irritable bowel syndrome)   . Myocardial infarction (HCC)    per pt, mild mi due to immunosuppressant therapy  . Nasal ulcer    gets occasionally from medications  . Osteoarthritis   . Osteoporosis    T score reported -2.7  . Pneumonia    20 years ago  . PONV (  postoperative nausea and vomiting)    also low blood pressure at times. do not use lactated ringers. must have sugar with saline d/t adrenal insufficiency  . Refusal of blood transfusions as patient is Jehovah's Witness   . Renal cyst 2019  . Sickle cell trait (HCC)   . Stroke [facility]) 2014   small peripheral stroke. Mononeuritis multiplex  . Subjective visual disturbance 07/14/2012  . Superficial phlebitis and thrombophlebitis of both lower extremities 2019  . Tear of lateral meniscus of right knee, current    Tear of lateral & medial meniscus of right knee  . Tinnitus    left ear sees Dr Ezzard Standing (ENT)  . Uveitis   . Visual changes 2019   left eye vision only with distance, right eye has floater. vascular changes    Past Surgical History:  Procedure Laterality Date  . ANTERIOR CERVICAL DECOMP/DISCECTOMY FUSION N/A 08/22/2013    Procedure: ANTERIOR CERVICAL DECOMPRESSION/DISCECTOMY FUSION 2 LEVELS;  Surgeon: Karn Cassis, MD;  Location: [facility];  Service: Neurosurgery;  Laterality: N/A;  C4-5 C5-6 Anterior cervical decompression/diskectomy/fusion  . ANTERIOR FUSION CERVICAL SPINE  08/22/2013   c4 5  6         . AXILLARY SURGERY    . BACK SURGERY    . BREAST EXCISIONAL BIOPSY Right 1986  . BREAST SURGERY     Cysts excised  . CARDIAC CATHETERIZATION  09/18/2013  . CESAREAN SECTION  03/2001   x 1  . CHOLECYSTECTOMY  12/2004  . COLONOSCOPY  03/2013  . DILATION AND CURETTAGE OF UTERUS    . EXCISION OF BREAST LESION Right 08/18/2017   Procedure: EXCISION OF RIGHT AXILLARY SOFT TISSUE MASS;  Surgeon: Carolan Shiver, MD;  Location: [facility] ORS;  Service: General;  Laterality: Right;  . HAND SURGERY Right 07/2001   dupytren  . HYSTEROSCOPY    . IR SI JOINT INJ / ARTH LEFT W/IMAG GUIDE  2018   si fusion , left   . IR SI JOINT INJ / ARTH RIGHT W/IMAG GUIDE Right 2018  . JOINT REPLACEMENT Right 11/2016   partial knee replacement  . KNEE ARTHROSCOPY WITH LATERAL MENISECTOMY Right 12/12/2012   Procedure: KNEE ARTHROSCOPY WITH LATERAL MENISECTOMY;  Surgeon: Nilda Simmer, MD;  Location: [facility];  Service: Orthopedics;  Laterality: Right;  . KNEE SURGERY Left 05/1998  . LEFT HEART CATHETERIZATION WITH CORONARY ANGIOGRAM N/A 09/18/2013   Procedure: LEFT HEART CATHETERIZATION WITH CORONARY ANGIOGRAM;  Surgeon: Micheline Chapman, MD;  Location: [facility] CATH LAB;  Service: Cardiovascular;  Laterality: N/A;  . LUMBAR DISC SURGERY    . OOPHORECTOMY     BSO  . PELVIC LAPAROSCOPY     DL  . SPINE SURGERY    . SUBMANDIBULAR MASS EXCISION    . THYROID CYST EXCISION  1983   thyroglossal duct cyst  . VAGINAL HYSTERECTOMY  07/2007   LAVH BSO    Medications: Prior to Admission medications   Medication Sig Start Date End Date Taking? Authorizing Provider  acetaminophen (TYLENOL) 500 MG tablet Take 500 mg  by mouth every 6 (six) hours as needed for moderate pain.     [provider]  Adalimumab 40 MG/0.8ML PNKT Inject 40 mg into the skin every 14 (fourteen) days.    [provider]  albuterol (PROVENTIL HFA;VENTOLIN HFA) 108 (90 BASE) MCG/ACT inhaler Inhale 1 puff into the lungs every 6 (six) hours as needed for wheezing or shortness of breath.     [provider]  Aloe-Sodium Chloride (AYR SALINE NASAL GEL NA) Place 1 spray into the nose daily as needed (congestion).    [provider]  aspirin EC 325 MG tablet Take 325 mg by mouth 2 (two) times daily.     [provider]  calcitRIOL (ROCALTROL) 0.5 MCG capsule Take 0.5 mcg by mouth 2 (two) times daily. 06/06/19   [provider]  colchicine 0.6 MG tablet Take 0.6 mg by mouth 2 (two) times daily. Colcrys 12/19/10   Moses Manners, MD  diphenhydrAMINE (BENADRYL) 25 mg capsule Take 25 mg by mouth at bedtime.     [provider]  DULoxetine (CYMBALTA) 60 MG capsule Take 60 mg by mouth daily.    [provider]  fluocinonide cream (LIDEX) 0.05 % Apply 1 application topically daily as needed (rash).  05/07/15   [provider]  fluticasone (FLONASE) 50 MCG/ACT nasal spray Place 1 spray into the nose daily as needed for allergies.     [provider]  folic acid (FOLVITE) 1 MG tablet Take 2 tablets by mouth daily. In the afternoon 05/01/19   [provider]  gabapentin (NEURONTIN) 100 MG capsule Take 100 mg by mouth in the morning, at noon, and at bedtime. 02/27/19   [provider]  hyoscyamine (LEVSIN SL) 0.125 MG SL tablet Place 1 tablet (0.125 mg total) under the tongue every 6 (six) hours as needed. 03/14/19   Sherrilyn Rist, MD  lansoprazole (PREVACID) 30 MG capsule Take 30 mg by mouth daily. 07/17/19   [provider]  leflunomide (ARAVA) 20 MG tablet Take 20 mg by mouth daily.    [provider]  loperamide (IMODIUM) 2 MG  capsule Take 1 capsule (2 mg total) by mouth as needed for diarrhea or loose stools. 02/16/19   Kathlen Mody, MD  Magnesium 500 MG CAPS Take 500 mg by mouth in the morning and at bedtime.    [provider]  Oral Electrolytes (THERMOTABS PO) Take 2 tablets by mouth daily as needed (hydration). May take an additional 2 more 2 tablet doses as needed for physical activity or heat    [provider]  OVER THE COUNTER MEDICATION Place 1 drop into both eyes as needed (dry eyes). Crocodile tears otc eye drops    [provider]  OVER THE COUNTER MEDICATION Swish and swallow 15 mLs 2 (two) times daily as needed (ulcers). Maalox mixed with liquid children's benadryl 3:1 ratio    [provider]  Pancrelipase, Lip-Prot-Amyl, 24000-76000 units CPEP 5 capsules po with meals, 3 capsules po with snacks 06/29/19   [provider]  potassium chloride SA (KLOR-CON) 20 MEQ tablet Take 20 mEq by mouth 3 (three) times daily.  04/28/19 04/27/20  [provider]  propranolol (INDERAL) 80 MG tablet TAKE 1 TABLET BY MOUTH TWICE DAILY. **REPLACES METOPROLOL** 10/06/19   Croitoru, Mihai, MD  spironolactone-hydrochlorothiazide (ALDACTAZIDE) 25-25 MG tablet Take 1 tablet by mouth daily. 03/13/19 03/12/20  [provider]  Turmeric (QC TUMERIC COMPLEX) 500 MG CAPS Take 600 mg by mouth daily.    [provider]    Allergies:   Allergies  Allergen Reactions  . Celebrex [Celecoxib] Other (See Comments)    Bleeding/bruising including bleeding for multiple days This is a reaction d/t sulfa product  . Fentanyl Nausea And Vomiting    Had mild heart attack after last surgery along with vomiting severely. Does NOT want fentanyl at all.  Versed and valium  are okay  . Latex Shortness Of Breath and Dermatitis  . Sulfa Drugs Cross Reactors Other (See Comments)    Eyes turn red, renal failure, stomach problems (vomiting, diarrhea), vasculature collapses, migraine like  symptoms  . Sulfites Other (See Comments)    Eyes turn red, renal failure, stomach problems (vomiting, diarrhea), vasculature collapses, migraine like symptoms  . Simponi [Golimumab] Swelling    Simponi Aria- Headache, blurred vision. (similar to remicade, but uses human dna)  . Spiractazide [Hydrochlorothiazide W-Spironolactone] Other (See Comments)    Made blood pressure rise  . Adhesive [Tape] Other (See Comments)    Pulls skin right off.  PAPER TAPE is okay. tegaderm is also okay  . Codeine Itching and Nausea And Vomiting    Tolerates hydrocodone   . Epinephrine Other (See Comments)    Tachycardia (this is a normal side effect of this medication)  . Erythromycin Itching and Rash    Has tolerated azithromycin  . Loratadine Itching  . Morphine And Related Itching and Rash    Social History:  reports that she has never smoked. She has never used smokeless tobacco. She reports that she does not drink alcohol and does not use drugs.  Family History: Family History  Problem Relation Age of Onset  . Diabetes Mother   . Breast cancer Mother        Age 32's  . Stroke Mother   . Depression Mother   . Hyperlipidemia Mother   . Gastric cancer Father   . Diabetes Sister   . Hypertension Sister   . Breast cancer Sister        Age 47  . Hyperlipidemia Sister   . Thyroid disease Sister   . CAD Maternal Grandfather   . Polymyositis Maternal Aunt   . Myasthenia gravis Maternal Aunt   . Lupus Cousin   . Rheum arthritis Cousin   . Skin cancer Maternal Aunt   . Prostate cancer Paternal Uncle      Objective   Physical Exam: Blood pressure 127/77, pulse 75, temperature 98 F (36.7 C), temperature source Oral, resp. rate 18, height 5\' 5"  (1.651 m), weight 79.4 kg, SpO2 95 %.  Physical Exam Vitals and nursing note reviewed.  Constitutional:      Appearance: Normal appearance.     Comments: Appears in pain  HENT:     Head: Normocephalic and atraumatic.  Eyes:      Conjunctiva/sclera: Conjunctivae normal.  Cardiovascular:     Rate and Rhythm: Normal rate and regular rhythm.  Pulmonary:     Effort: Pulmonary effort is normal.     Breath sounds: Normal breath sounds.  Abdominal:     General: Abdomen is flat.     Palpations: Abdomen is soft.  Musculoskeletal:     Comments: Externally rotated left leg with pulses intact  Skin:    Coloration: Skin is not jaundiced or pale.  Neurological:     Mental Status: She is alert. Mental status is at baseline.  Psychiatric:        Mood and Affect: Mood normal.        Behavior: Behavior normal.     LABS on Admission: I have personally reviewed all the labs and imaging below    Basic Metabolic Panel: Recent Labs  Lab 04/01/20 0719  NA 140  K 3.4*  CL 110  CO2 24  GLUCOSE 104*  BUN 13  CREATININE 0.58  CALCIUM 8.1*   Liver Function Tests: No results for input(s): AST, ALT,  ALKPHOS, BILITOT, PROT, ALBUMIN in the last 168 hours. No results for input(s): LIPASE, AMYLASE in the last 168 hours. No results for input(s): AMMONIA in the last 168 hours. CBC: Recent Labs  Lab 04/01/20 0719  WBC 6.7  NEUTROABS 3.6  HGB 10.1*  HCT 31.4*  MCV 94.9  PLT PLATELET CLUMPS NOTED ON SMEAR, UNABLE TO ESTIMATE   Cardiac Enzymes: No results for input(s): CKTOTAL, CKMB, CKMBINDEX, TROPONINI in the last 168 hours. BNP: Invalid input(s): POCBNP CBG: No results for input(s): GLUCAP in the last 168 hours.  Radiological Exams on Admission:  DG Chest 1 View  Result Date: 04/01/2020 CLINICAL DATA:  Hip fracture EXAM: CHEST  1 VIEW COMPARISON:  02/15/2019 FINDINGS: Normal heart size and mediastinal contours. There is no edema, consolidation, effusion, or pneumothorax. No acute osseous finding. ACDF. IMPRESSION: No evidence of active disease. Electronically Signed   By: Marnee Spring M.D.   On: 04/01/2020 08:03   DG Hip Unilat W or Wo Pelvis 2-3 Views Left  Result Date: 04/01/2020 CLINICAL DATA:  Fracture EXAM:  DG HIP (WITH OR WITHOUT PELVIS) 2-3V LEFT COMPARISON:  None. FINDINGS: There is an oblique, displaced, angulated, and foreshortened fracture of the most proximal left femoral diaphysis. There is widening of the pubic symphysis. The partially imaged proximal right femur is intact. Postoperative findings of lower lumbar fusion and bilateral sacroiliac fusion. IMPRESSION: 1. There is an oblique, displaced, angulated, and foreshortened fracture of the most proximal left femoral diaphysis. In general, this location and reported low energy mechanism raise high suspicion for pathologic fracture; there is no obvious evidence of osseous lesion by radiographs. 2. There is widening of the pubic symphysis, concerning for additional injury. Electronically Signed   By: Lauralyn Primes M.D.   On: 04/01/2020 08:07      EKG: Pending   A & P   Principal Problem:   Femur fracture (HCC) Active Problems:   Behcet's disease (HCC)   IBS (irritable bowel syndrome)   Iatrogenic adrenal insufficiency (HCC)   Pancreatic insufficiency   1. Left femur fracture with abnormal pubic symphysis  mechanical fall a. Likely stress fracture from chronic steroid use and mis-stepping this morning while walking her dog b. ED provider discussed with Dr. Eulah Pont, Ortho, who recommended Buck's traction and will likely surgically fix tomorrow.  Believes that the abnormal pubic symphysis is due to previous SI joint surgery and not due to acute trauma. c. Foley catheter placed in the ED d. Continue Dilaudid e. Robaxin for muscle spasms pending QT check f. She has required stress dose steroids in the past perioperatively and has had an AM cortisol level 0.5 in 2015.  Will start on stress dose steroids Solu-Cortef 50 mg every 6 hours for known adrenal insufficiency.  Carb modified diet while on high-dose steroids with sensitive sliding scale.  2. Behcet's syndrome and secondary adrenal insufficiency due to chronic steroid use a. On  aspirin 325 mg twice daily for vasculitis b. Follows with [facility]ology at [facility] and endocrinology at [facility] c. Stress dose steroids as above  3. Rheumatoid arthritis, not in acute flare a. On steroids, leflunomide daily and Humira weekly  4. POTS  Tremor a. Continue home propranolol  5. Pancreatic insufficiency a. Continue home pancreatic enzymes  6. IBS a. Continue home Imodium and Bentyl as needed  7. GERD a. Continue PPI  8. Hypocalcemia, chronic a. Continue home calcium/vitamin D supplement    DVT prophylaxis: SCDs and high dose aspirin   Code Status: Full Code  Diet: Heart healthy carb modified Family Communication: Admission, patients condition and plan of care including tests being ordered have been discussed with the patient who indicates understanding and agrees with the plan and Code Status.  Disposition Plan: The appropriate patient status for this patient is INPATIENT. Inpatient status is judged to be reasonable and necessary in order to provide the required intensity of service to ensure the patient's safety. The patient's presenting symptoms, physical exam findings, and initial radiographic and laboratory data in the context of their chronic comorbidities is felt to place them at high risk for further clinical deterioration. Furthermore, it is not anticipated that the patient will be medically stable for discharge from the hospital within 2 midnights of admission. The following factors support the patient status of inpatient.   " The patient's presenting symptoms include left leg pain. " The worrisome physical exam findings include externally rotated left leg. " The initial radiographic and laboratory data are worrisome because of left femur fracture. " The chronic co-morbidities include as above.   * I certify that at the point of admission it is my clinical judgment that the patient will require inpatient hospital care spanning beyond 2 midnights from the point of  admission due to high intensity of service, high risk for further deterioration and high frequency of surveillance required.*   Status is: Inpatient  Remains inpatient appropriate because:IV treatments appropriate due to intensity of illness or inability to take PO and Inpatient level of care appropriate due to severity of illness   Dispo: The patient is from: Home              Anticipated d/c is to: SNF              Patient currently is not medically stable to d/c.   Difficult to place patient No          The medical decision making on this patient was of high complexity and the patient is at high risk for clinical deterioration, therefore this is a level 3 admission.  Consultants  . Orthopedic surgery  Procedures  . Foley  Time Spent on Admission: 70 minutes    Jae Dire, DO [facility]  04/01/2020, 10:38 AM

## 2020-04-01 NOTE — ED Provider Notes (Signed)
[facility]-EMERGENCY DEPT Provider Note   CSN: [id number] Arrival date & time: 04/01/20  4098     History Chief Complaint  Patient presents with  . Fall  . Leg Injury    Madeleine Fenn Cobb-Dean is a 60 y.o. female.  60 year old female with past medical history below including adrenal insufficiency, Behcet's syndrome on chronic steroids, CVA who p/w L leg pain. Pt reports a few weeks of pain in L hip/upper thigh, notices it first thing in the morning. No injury or change in physical activity. This morning, she got up to let her dog outside and while walking, she suddenly stopped and when she did this, she felt a pop in her left leg and fell to the ground. No head injury or LOC, no other injuries.  She has had severe pain in left upper leg/hip.  She has intolerance to morphine and fentanyl therefore was given ketamine in route by EMS.  Last food intake was around midnight.  She denies anticoagulant use.  The history is provided by the patient.  Fall       Past Medical History:  Diagnosis Date  . Acid reflux   . Adrenal insufficiency (HCC)   . Anxiety    when she gets sick she gets anxious  . Arrhythmia   . Arthritis    inflammatory arthritis  . Asthma   . Behcet's syndrome (HCC)   . Chronic back pain   . Chronic neck pain   . Colitis   . Connective tissue disorder (HCC)   . Depression   . Dysrhythmia    POTS syndrome  . Endometritis   . Foot drop    due to muscle weakness from immunosuppressants  . GERD (gastroesophageal reflux disease)   . Gluten intolerance   . Heart murmur   . IBS (irritable bowel syndrome)   . Myocardial infarction (HCC)    per pt, mild mi due to immunosuppressant therapy  . Nasal ulcer    gets occasionally from medications  . Osteoarthritis   . Osteoporosis    T score reported -2.7  . Pneumonia    20 years ago  . PONV (postoperative nausea and vomiting)    also low blood pressure at times. do not use lactated  ringers. must have sugar with saline d/t adrenal insufficiency  . Refusal of blood transfusions as patient is Jehovah's Witness   . Renal cyst 2019  . Sickle cell trait (HCC)   . Stroke [facility]) 2014   small peripheral stroke. Mononeuritis multiplex  . Subjective visual disturbance 07/14/2012  . Superficial phlebitis and thrombophlebitis of both lower extremities 2019  . Tear of lateral meniscus of right knee, current    Tear of lateral & medial meniscus of right knee  . Tinnitus    left ear sees Dr Ezzard Standing (ENT)  . Uveitis   . Visual changes 2019   left eye vision only with distance, right eye has floater. vascular changes    Patient Active Problem List   Diagnosis Date Noted  . Femur fracture (HCC) 04/01/2020  . Tremor 08/04/2019  . Abnormal cardiovascular stress test 08/04/2019  . Hypokalemia 02/15/2019  . Chest pain 02/15/2019  . Iatrogenic adrenal insufficiency (HCC) 01/15/2018  . Orthostatic hypotension 12/07/2013  . Adrenal insufficiency (HCC) 09/25/2013  . Palpitations 09/16/2013  . Cervical stenosis of spinal canal 08/22/2013  . Lumbar degenerative disc disease 12/30/2012  . Tear of lateral meniscus of right knee, current   . Osteopenia   .  Lumbar back pain 09/13/2012  . Subjective visual disturbance 07/14/2012  . Endometriosis   . Endometrial polyp   . IBS (irritable bowel syndrome)   . Acid reflux   . Colitis   . Behcet's syndrome (HCC)   . Glucose intolerance (impaired glucose tolerance) 12/19/2010  . Behcet's disease (HCC) 01/06/2004    Past Surgical History:  Procedure Laterality Date  . ANTERIOR CERVICAL DECOMP/DISCECTOMY FUSION N/A 08/22/2013   Procedure: ANTERIOR CERVICAL DECOMPRESSION/DISCECTOMY FUSION 2 LEVELS;  Surgeon: Karn Cassis, MD;  Location: [facility];  Service: Neurosurgery;  Laterality: N/A;  C4-5 C5-6 Anterior cervical decompression/diskectomy/fusion  . ANTERIOR FUSION CERVICAL SPINE  08/22/2013   c4 5  6         . AXILLARY SURGERY    .  BACK SURGERY    . BREAST EXCISIONAL BIOPSY Right 1986  . BREAST SURGERY     Cysts excised  . CARDIAC CATHETERIZATION  09/18/2013  . CESAREAN SECTION  03/2001   x 1  . CHOLECYSTECTOMY  12/2004  . COLONOSCOPY  03/2013  . DILATION AND CURETTAGE OF UTERUS    . EXCISION OF BREAST LESION Right 08/18/2017   Procedure: EXCISION OF RIGHT AXILLARY SOFT TISSUE MASS;  Surgeon: Carolan Shiver, MD;  Location: [facility] ORS;  Service: General;  Laterality: Right;  . HAND SURGERY Right 07/2001   dupytren  . HYSTEROSCOPY    . IR SI JOINT INJ / ARTH LEFT W/IMAG GUIDE  2018   si fusion , left   . IR SI JOINT INJ / ARTH RIGHT W/IMAG GUIDE Right 2018  . JOINT REPLACEMENT Right 11/2016   partial knee replacement  . KNEE ARTHROSCOPY WITH LATERAL MENISECTOMY Right 12/12/2012   Procedure: KNEE ARTHROSCOPY WITH LATERAL MENISECTOMY;  Surgeon: Nilda Simmer, MD;  Location: [facility];  Service: Orthopedics;  Laterality: Right;  . KNEE SURGERY Left 05/1998  . LEFT HEART CATHETERIZATION WITH CORONARY ANGIOGRAM N/A 09/18/2013   Procedure: LEFT HEART CATHETERIZATION WITH CORONARY ANGIOGRAM;  Surgeon: Micheline Chapman, MD;  Location: [facility] CATH LAB;  Service: Cardiovascular;  Laterality: N/A;  . LUMBAR DISC SURGERY    . OOPHORECTOMY     BSO  . PELVIC LAPAROSCOPY     DL  . SPINE SURGERY    . SUBMANDIBULAR MASS EXCISION    . THYROID CYST EXCISION  1983   thyroglossal duct cyst  . VAGINAL HYSTERECTOMY  07/2007   LAVH BSO     OB History    Gravida  3   Para  1   Term  1   Preterm      AB  2   Living  1     SAB      IAB      Ectopic      Multiple      Live Births              Family History  Problem Relation Age of Onset  . Diabetes Mother   . Breast cancer Mother        Age 22's  . Stroke Mother   . Depression Mother   . Hyperlipidemia Mother   . Gastric cancer Father   . Diabetes Sister   . Hypertension Sister   . Breast cancer Sister        Age 7  .  Hyperlipidemia Sister   . Thyroid disease Sister   . CAD Maternal Grandfather   . Polymyositis Maternal Aunt   . Myasthenia gravis Maternal  Aunt   . Lupus Cousin   . Rheum arthritis Cousin   . Skin cancer Maternal Aunt   . Prostate cancer Paternal Uncle     Social History   Tobacco Use  . Smoking status: Never Smoker  . Smokeless tobacco: Never Used  Vaping Use  . Vaping Use: Never used  Substance Use Topics  . Alcohol use: No    Alcohol/week: 0.0 standard drinks  . Drug use: No    Home Medications Prior to Admission medications   Medication Sig Start Date End Date Taking? Authorizing Provider  acetaminophen (TYLENOL) 500 MG tablet Take 500 mg by mouth every 6 (six) hours as needed for moderate pain.     [provider]  Adalimumab 40 MG/0.8ML PNKT Inject 40 mg into the skin every 14 (fourteen) days.    [provider]  albuterol (PROVENTIL HFA;VENTOLIN HFA) 108 (90 BASE) MCG/ACT inhaler Inhale 1 puff into the lungs every 6 (six) hours as needed for wheezing or shortness of breath.     [provider]  Aloe-Sodium Chloride (AYR SALINE NASAL GEL NA) Place 1 spray into the nose daily as needed (congestion).    [provider]  aspirin EC 325 MG tablet Take 325 mg by mouth 2 (two) times daily.     [provider]  calcitRIOL (ROCALTROL) 0.5 MCG capsule Take 0.5 mcg by mouth 2 (two) times daily. 06/06/19   [provider]  colchicine 0.6 MG tablet Take 0.6 mg by mouth 2 (two) times daily. Colcrys 12/19/10   Moses Manners, MD  diphenhydrAMINE (BENADRYL) 25 mg capsule Take 25 mg by mouth at bedtime.     [provider]  DULoxetine (CYMBALTA) 60 MG capsule Take 60 mg by mouth daily.    [provider]  fluocinonide cream (LIDEX) 0.05 % Apply 1 application topically daily as needed (rash).  05/07/15   [provider]  fluticasone (FLONASE) 50 MCG/ACT nasal spray Place 1 spray into the nose daily as needed  for allergies.     [provider]  folic acid (FOLVITE) 1 MG tablet Take 2 tablets by mouth daily. In the afternoon 05/01/19   [provider]  gabapentin (NEURONTIN) 100 MG capsule Take 100 mg by mouth in the morning, at noon, and at bedtime. 02/27/19   [provider]  hyoscyamine (LEVSIN SL) 0.125 MG SL tablet Place 1 tablet (0.125 mg total) under the tongue every 6 (six) hours as needed. 03/14/19   Sherrilyn Rist, MD  lansoprazole (PREVACID) 30 MG capsule Take 30 mg by mouth daily. 07/17/19   [provider]  leflunomide (ARAVA) 20 MG tablet Take 20 mg by mouth daily.    [provider]  loperamide (IMODIUM) 2 MG capsule Take 1 capsule (2 mg total) by mouth as needed for diarrhea or loose stools. 02/16/19   Kathlen Mody, MD  Magnesium 500 MG CAPS Take 500 mg by mouth in the morning and at bedtime.    [provider]  Oral Electrolytes (THERMOTABS PO) Take 2 tablets by mouth daily as needed (hydration). May take an additional 2 more 2 tablet doses as needed for physical activity or heat    [provider]  OVER THE COUNTER MEDICATION Place 1 drop into both eyes as needed (dry eyes). Crocodile tears otc eye drops    [provider]  OVER THE COUNTER MEDICATION Swish and swallow 15 mLs 2 (two) times daily as needed (ulcers). Maalox mixed with  liquid children's benadryl 3:1 ratio    [provider]  Pancrelipase, Lip-Prot-Amyl, 24000-76000 units CPEP 5 capsules po with meals, 3 capsules po with snacks 06/29/19   [provider]  potassium chloride SA (KLOR-CON) 20 MEQ tablet Take 20 mEq by mouth 3 (three) times daily.  04/28/19 04/27/20  [provider]  propranolol (INDERAL) 80 MG tablet TAKE 1 TABLET BY MOUTH TWICE DAILY. **REPLACES METOPROLOL** 10/06/19   Croitoru, Mihai, MD  spironolactone-hydrochlorothiazide (ALDACTAZIDE) 25-25 MG tablet Take 1 tablet by mouth daily. 03/13/19 03/12/20  [provider]  Turmeric (QC TUMERIC COMPLEX) 500 MG CAPS Take 600 mg by mouth daily.    [provider]    Allergies    Celebrex [celecoxib], Fentanyl, Latex, Sulfa drugs cross reactors, Sulfites, Simponi [golimumab], Adhesive [tape], Codeine, Epinephrine, Erythromycin, Loratadine, and Morphine and related  Review of Systems   Review of Systems All other systems reviewed and are negative except that which was mentioned in HPI  Physical Exam Updated Vital Signs BP 100/82   Pulse 70   Temp 98 F (36.7 C) (Oral)   Resp 18   Ht 5\' 5"  (1.651 m)   Wt 79.4 kg   SpO2 96%   BMI 29.12 kg/m   Physical Exam Vitals and nursing note reviewed.  Constitutional:      General: She is not in acute distress.    Appearance: Normal appearance.  HENT:     Head: Normocephalic and atraumatic.  Eyes:     Conjunctiva/sclera: Conjunctivae normal.  Cardiovascular:     Rate and Rhythm: Normal rate and regular rhythm.     Heart sounds: Normal heart sounds. No murmur heard.   Pulmonary:     Effort: Pulmonary effort is normal.     Breath sounds: Normal breath sounds.  Abdominal:     General: Abdomen is flat. Bowel sounds are normal. There is no distension.     Palpations: Abdomen is soft.     Tenderness: There is no abdominal tenderness.  Musculoskeletal:        General: Deformity present.     Right lower leg: No edema.     Left lower leg: No edema.     Comments: L leg severely externally rotated at hip and shortened compared to R; no knee or ankle tenderness, + L upper thigh tenderness  Skin:    General: Skin is warm and dry.     Capillary Refill: Capillary refill takes less than 2 seconds.  Neurological:     Mental Status: She is alert and oriented to person, place, and time.     Sensory: No sensory deficit.     Comments: fluent  Psychiatric:        Mood and Affect: Mood normal.        Behavior: Behavior normal.     ED Results / Procedures / Treatments   Labs (all labs ordered are  listed, but only abnormal results are displayed) Labs Reviewed  CBC WITH DIFFERENTIAL/PLATELET - Abnormal; Notable for the following components:      Result Value   RBC 3.31 (*)    Hemoglobin 10.1 (*)    HCT 31.4 (*)    All other components within normal limits  BASIC METABOLIC PANEL - Abnormal; Notable for the following components:   Potassium 3.4 (*)    Glucose, Bld 104 (*)    Calcium 8.1 (*)    All other components within normal limits  RESP PANEL BY RT-PCR (FLU A&B, COVID) ARPGX2  EKG None  Radiology DG Chest 1 View  Result Date: 04/01/2020 CLINICAL DATA:  Hip fracture EXAM: CHEST  1 VIEW COMPARISON:  02/15/2019 FINDINGS: Normal heart size and mediastinal contours. There is no edema, consolidation, effusion, or pneumothorax. No acute osseous finding. ACDF. IMPRESSION: No evidence of active disease. Electronically Signed   By: Marnee Spring M.D.   On: 04/01/2020 08:03   DG Hip Unilat W or Wo Pelvis 2-3 Views Left  Result Date: 04/01/2020 CLINICAL DATA:  Fracture EXAM: DG HIP (WITH OR WITHOUT PELVIS) 2-3V LEFT COMPARISON:  None. FINDINGS: There is an oblique, displaced, angulated, and foreshortened fracture of the most proximal left femoral diaphysis. There is widening of the pubic symphysis. The partially imaged proximal right femur is intact. Postoperative findings of lower lumbar fusion and bilateral sacroiliac fusion. IMPRESSION: 1. There is an oblique, displaced, angulated, and foreshortened fracture of the most proximal left femoral diaphysis. In general, this location and reported low energy mechanism raise high suspicion for pathologic fracture; there is no obvious evidence of osseous lesion by radiographs. 2. There is widening of the pubic symphysis, concerning for additional injury. Electronically Signed   By: Lauralyn Primes M.D.   On: 04/01/2020 08:07    Procedures Procedures   Medications Ordered in ED Medications  HYDROmorphone (DILAUDID) injection 0.5 mg (0.5 mg  Intravenous Given 04/01/20 0720)  diazepam (VALIUM) tablet 5 mg (has no administration in time range)  HYDROmorphone (DILAUDID) injection 0.5 mg (has no administration in time range)  HYDROmorphone (DILAUDID) injection 0.5 mg (0.5 mg Intravenous Given 04/01/20 0747)    ED Course  I have reviewed the triage vital signs and the nursing notes.  Pertinent labs & imaging results that were available during my care of the patient were reviewed by me and considered in my medical decision making (see chart for details).    MDM Rules/Calculators/A&P                          XR shows displaced and angulated proximal femur fracture. Also question on widened pubic symphysis, discussed w/ ortho, Dr. Eulah Pont, who recommended Buck's traction and they will likely surgically fix tomorrow. He feels that pubic symphysis is likely abnormal due to previous SI joint surgery, not due to acute trauma. I have ordered foley as pt is strict bedrest and I have ordered dilaudid and valium for pain/muscle spasms.   Screening labs show Hgb 10.1, K 3.4, Ca 8.1. Ordered oral K and Ca repletion. Discussed admission w/ [facility], Dr. Dairl Ponder. PT admitted for further care. Will hold off on stress dose steroids for now after discussing with hospitalist team. Final Clinical Impression(s) / ED Diagnoses Final diagnoses:  Closed fracture of proximal end of left femur, initial encounter [facility])    Rx / DC Orders ED Discharge Orders    None       Deborah Lazcano, Ambrose Finland, MD 04/01/20 [phone]

## 2020-04-01 NOTE — Progress Notes (Signed)
Orthopedic Tech Progress Note Patient Details:  [name] [birth year] [id number]  Patient ID: [name], female   DOB: February 10, 1960, 60 y.o.   MRN: [id number]   Kizzie Fantasia 04/01/2020, 12:14 PM Bucks traction applied. 7lbs. Pt couldn't tolerate 10lbs as ordered.

## 2020-04-01 NOTE — ED Triage Notes (Addendum)
Pt via ems to room 02. Pt reports getting up from bed to let dog out and made a sudden stop and felt pop in left femur. Pulses 2+. Pt unable to move extremity. Hx osteoporosis. 16 mg of ketamine given PTA by ems around 0555.

## 2020-04-02 ENCOUNTER — Inpatient Hospital Stay (HOSPITAL_COMMUNITY): Payer: Medicare Other

## 2020-04-02 ENCOUNTER — Inpatient Hospital Stay (HOSPITAL_COMMUNITY): Payer: Medicare Other | Admitting: Anesthesiology

## 2020-04-02 ENCOUNTER — Encounter (HOSPITAL_COMMUNITY)
Admission: EM | Disposition: A | Discharge status: Home Health | Payer: Self-pay | Source: Home / Self Care | Attending: Internal Medicine

## 2020-04-02 ENCOUNTER — Encounter (HOSPITAL_COMMUNITY): Payer: Self-pay | Admitting: Internal Medicine

## 2020-04-02 DIAGNOSIS — S72002A Fracture of unspecified part of neck of left femur, initial encounter for closed fracture: Secondary | ICD-10-CM | POA: Diagnosis not present

## 2020-04-02 DIAGNOSIS — M352 Behcet's disease: Secondary | ICD-10-CM | POA: Diagnosis not present

## 2020-04-02 DIAGNOSIS — E273 Drug-induced adrenocortical insufficiency: Secondary | ICD-10-CM | POA: Diagnosis not present

## 2020-04-02 DIAGNOSIS — Z20822 Contact with and (suspected) exposure to covid-19: Secondary | ICD-10-CM | POA: Diagnosis not present

## 2020-04-02 HISTORY — PX: FEMUR IM NAIL: SHX1597

## 2020-04-02 LAB — CBC
HCT: 39.6 % (ref 36.0–46.0)
Hemoglobin: 12.8 g/dL (ref 12.0–15.0)
MCH: 29.9 pg (ref 26.0–34.0)
MCHC: 32.3 g/dL (ref 30.0–36.0)
MCV: 92.5 fL (ref 80.0–100.0)
Platelets: DECREASED 10*3/uL (ref 150–400)
RBC: 4.28 MIL/uL (ref 3.87–5.11)
RDW: 13.4 % (ref 11.5–15.5)
WBC: 12.6 10*3/uL — ABNORMAL HIGH (ref 4.0–10.5)
nRBC: 0 % (ref 0.0–0.2)

## 2020-04-02 LAB — BASIC METABOLIC PANEL
Anion gap: 6 (ref 5–15)
BUN: 9 mg/dL (ref 6–20)
CO2: 28 mmol/L (ref 22–32)
Calcium: 9.2 mg/dL (ref 8.9–10.3)
Chloride: 105 mmol/L (ref 98–111)
Creatinine, Ser: 0.51 mg/dL (ref 0.44–1.00)
GFR, Estimated: >60 mL/min (ref >60)
Glucose, Bld: 116 mg/dL — ABNORMAL HIGH (ref 70–99)
Potassium: 4.1 mmol/L (ref 3.5–5.1)
Sodium: 139 mmol/L (ref 135–145)

## 2020-04-02 LAB — HIV ANTIBODY (ROUTINE TESTING W REFLEX): HIV Screen 4th Generation wRfx: NONREACTIVE

## 2020-04-02 LAB — SURGICAL PCR SCREEN
MRSA, PCR: POSITIVE — AB
Staphylococcus aureus: POSITIVE — AB

## 2020-04-02 LAB — GLUCOSE, CAPILLARY
Glucose-Capillary: 108 mg/dL — ABNORMAL HIGH (ref 70–99)
Glucose-Capillary: 146 mg/dL — ABNORMAL HIGH (ref 70–99)
Glucose-Capillary: 112 mg/dL — ABNORMAL HIGH (ref 70–99)
Glucose-Capillary: 125 mg/dL — ABNORMAL HIGH (ref 70–99)

## 2020-04-02 SURGERY — INSERTION, INTRAMEDULLARY ROD, FEMUR
Anesthesia: General | Laterality: Left

## 2020-04-02 MED ORDER — ASPIRIN EC 325 MG PO TBEC: 325.0000 mg | DELAYED_RELEASE_TABLET | Freq: Every day | ORAL | Status: DC

## 2020-04-02 MED ORDER — PROPOFOL 10 MG/ML IV BOLUS
INTRAVENOUS | Status: AC
  Filled 2020-04-02: qty 20

## 2020-04-02 MED ORDER — MIDAZOLAM HCL 5 MG/5ML IJ SOLN
INTRAMUSCULAR | Status: DC | PRN
  Administered 2020-04-02 (×2): 1 mg via INTRAVENOUS

## 2020-04-02 MED ORDER — MUPIROCIN 2 % EX OINT
1.0000 "application " | TOPICAL_OINTMENT | Freq: Two times a day (BID) | CUTANEOUS | Status: DC
  Administered 2020-04-02 – 2020-04-04 (×4): 1 via NASAL
  Filled 2020-04-02 (×2): qty 22

## 2020-04-02 MED ORDER — ROCURONIUM BROMIDE 100 MG/10ML IV SOLN
INTRAVENOUS | Status: DC | PRN
  Administered 2020-04-02: 10 mg via INTRAVENOUS
  Administered 2020-04-02: 60 mg via INTRAVENOUS

## 2020-04-02 MED ORDER — LIDOCAINE 2% (20 MG/ML) 5 ML SYRINGE
INTRAMUSCULAR | Status: AC
  Filled 2020-04-02: qty 5

## 2020-04-02 MED ORDER — PHENYLEPHRINE HCL (PRESSORS) 10 MG/ML IV SOLN
INTRAVENOUS | Status: AC
  Filled 2020-04-02: qty 1

## 2020-04-02 MED ORDER — ONDANSETRON HCL 4 MG/2ML IJ SOLN
INTRAMUSCULAR | Status: DC | PRN
  Administered 2020-04-02: 4 mg via INTRAVENOUS

## 2020-04-02 MED ORDER — METOCLOPRAMIDE HCL 5 MG PO TABS: 5.0000 mg | ORAL_TABLET | Freq: Three times a day (TID) | ORAL | Status: DC | PRN

## 2020-04-02 MED ORDER — SODIUM CHLORIDE 0.9 % IR SOLN
Status: DC | PRN
  Administered 2020-04-02: 1000 mL

## 2020-04-02 MED ORDER — ENSURE PRE-SURGERY PO LIQD
296.0000 mL | Freq: Once | ORAL | Status: AC
  Administered 2020-04-02: 296 mL via ORAL
  Filled 2020-04-02: qty 296

## 2020-04-02 MED ORDER — PROPOFOL 10 MG/ML IV BOLUS
INTRAVENOUS | Status: DC | PRN
  Administered 2020-04-02: 120 mg via INTRAVENOUS

## 2020-04-02 MED ORDER — ROCURONIUM BROMIDE 10 MG/ML (PF) SYRINGE
PREFILLED_SYRINGE | INTRAVENOUS | Status: AC
  Filled 2020-04-02: qty 10

## 2020-04-02 MED ORDER — PHENOL 1.4 % MT LIQD: 1.0000 | OROMUCOSAL | Status: DC | PRN

## 2020-04-02 MED ORDER — ONDANSETRON HCL 4 MG PO TABS: 4.0000 mg | ORAL_TABLET | Freq: Four times a day (QID) | ORAL | Status: DC | PRN

## 2020-04-02 MED ORDER — CEFAZOLIN SODIUM-DEXTROSE 2-4 GM/100ML-% IV SOLN
2.0000 g | Freq: Four times a day (QID) | INTRAVENOUS | Status: AC
  Administered 2020-04-02 (×2): 2 g via INTRAVENOUS
  Filled 2020-04-02 (×2): qty 100

## 2020-04-02 MED ORDER — SUFENTANIL CITRATE 50 MCG/ML IV SOLN
INTRAVENOUS | Status: DC | PRN
  Administered 2020-04-02 (×2): 5 ug via INTRAVENOUS
  Administered 2020-04-02: 7 ug via INTRAVENOUS

## 2020-04-02 MED ORDER — SUFENTANIL CITRATE 50 MCG/ML IV SOLN
INTRAVENOUS | Status: AC
  Filled 2020-04-02: qty 1

## 2020-04-02 MED ORDER — POLYETHYLENE GLYCOL 3350 17 G PO PACK: 17.0000 g | PACK | Freq: Every day | ORAL | Status: DC | PRN

## 2020-04-02 MED ORDER — HYDROMORPHONE HCL 1 MG/ML IJ SOLN
INTRAMUSCULAR | Status: AC
  Filled 2020-04-02: qty 1

## 2020-04-02 MED ORDER — DEXAMETHASONE SODIUM PHOSPHATE 10 MG/ML IJ SOLN
INTRAMUSCULAR | Status: AC
  Filled 2020-04-02: qty 1

## 2020-04-02 MED ORDER — SODIUM CHLORIDE 0.9 % IV SOLN: INTRAVENOUS | Status: DC | PRN

## 2020-04-02 MED ORDER — HYDROMORPHONE HCL 1 MG/ML IJ SOLN: 0.5000 mg | INTRAMUSCULAR | Status: DC | PRN

## 2020-04-02 MED ORDER — LIDOCAINE HCL (CARDIAC) PF 100 MG/5ML IV SOSY
PREFILLED_SYRINGE | INTRAVENOUS | Status: DC | PRN
  Administered 2020-04-02: 50 mg via INTRAVENOUS

## 2020-04-02 MED ORDER — EPHEDRINE SULFATE 50 MG/ML IJ SOLN
INTRAMUSCULAR | Status: DC | PRN
  Administered 2020-04-02 (×3): 10 mg via INTRAVENOUS

## 2020-04-02 MED ORDER — CEFAZOLIN SODIUM-DEXTROSE 2-4 GM/100ML-% IV SOLN
2.0000 g | INTRAVENOUS | Status: AC
  Administered 2020-04-02: 2 g via INTRAVENOUS
  Filled 2020-04-02: qty 100

## 2020-04-02 MED ORDER — MENTHOL 3 MG MT LOZG: 1.0000 | LOZENGE | OROMUCOSAL | Status: DC | PRN

## 2020-04-02 MED ORDER — DOCUSATE SODIUM 100 MG PO CAPS
100.0000 mg | ORAL_CAPSULE | Freq: Two times a day (BID) | ORAL | Status: DC
  Administered 2020-04-02 – 2020-04-04 (×4): 100 mg via ORAL
  Filled 2020-04-02 (×4): qty 1

## 2020-04-02 MED ORDER — ENSURE SURGERY PO LIQD
237.0000 mL | Freq: Two times a day (BID) | ORAL | Status: DC
  Administered 2020-04-02 – 2020-04-04 (×4): 237 mL via ORAL

## 2020-04-02 MED ORDER — ALUM & MAG HYDROXIDE-SIMETH 200-200-20 MG/5ML PO SUSP: 30.0000 mL | ORAL | Status: DC | PRN

## 2020-04-02 MED ORDER — TRANEXAMIC ACID-NACL 1000-0.7 MG/100ML-% IV SOLN
1000.0000 mg | INTRAVENOUS | Status: AC
  Administered 2020-04-02: 1000 mg via INTRAVENOUS
  Filled 2020-04-02: qty 100

## 2020-04-02 MED ORDER — ACETAMINOPHEN 10 MG/ML IV SOLN: 1000.0000 mg | Freq: Once | INTRAVENOUS | Status: DC | PRN

## 2020-04-02 MED ORDER — MIDAZOLAM HCL 2 MG/2ML IJ SOLN
INTRAMUSCULAR | Status: AC
  Filled 2020-04-02: qty 2

## 2020-04-02 MED ORDER — METHOCARBAMOL 500 MG IVPB - SIMPLE MED
500.0000 mg | Freq: Four times a day (QID) | INTRAVENOUS | Status: DC | PRN
  Filled 2020-04-02: qty 50

## 2020-04-02 MED ORDER — METHOCARBAMOL 500 MG PO TABS
500.0000 mg | ORAL_TABLET | Freq: Four times a day (QID) | ORAL | Status: DC | PRN
  Administered 2020-04-03 (×2): 500 mg via ORAL
  Filled 2020-04-02 (×3): qty 1

## 2020-04-02 MED ORDER — TRAMADOL HCL 50 MG PO TABS
50.0000 mg | ORAL_TABLET | Freq: Four times a day (QID) | ORAL | Status: DC
Start: 2020-04-02 — End: 2020-04-04
  Administered 2020-04-02 – 2020-04-04 (×2): 50 mg via ORAL
  Filled 2020-04-02 (×3): qty 1

## 2020-04-02 MED ORDER — ONDANSETRON HCL 4 MG/2ML IJ SOLN
4.0000 mg | Freq: Four times a day (QID) | INTRAMUSCULAR | Status: DC | PRN
  Administered 2020-04-03 – 2020-04-04 (×2): 4 mg via INTRAVENOUS

## 2020-04-02 MED ORDER — ACETAMINOPHEN 500 MG PO TABS
1000.0000 mg | ORAL_TABLET | Freq: Four times a day (QID) | ORAL | Status: AC
  Administered 2020-04-02 – 2020-04-03 (×3): 1000 mg via ORAL
  Filled 2020-04-02 (×4): qty 2

## 2020-04-02 MED ORDER — BISACODYL 10 MG RE SUPP: 10.0000 mg | Freq: Every day | RECTAL | Status: DC | PRN

## 2020-04-02 MED ORDER — SUGAMMADEX SODIUM 200 MG/2ML IV SOLN
INTRAVENOUS | Status: DC | PRN
  Administered 2020-04-02: 200 mg via INTRAVENOUS

## 2020-04-02 MED ORDER — ACETAMINOPHEN 500 MG PO TABS
1000.0000 mg | ORAL_TABLET | Freq: Once | ORAL | Status: AC
  Administered 2020-04-02: 1000 mg via ORAL
  Filled 2020-04-02: qty 2

## 2020-04-02 MED ORDER — METOCLOPRAMIDE HCL 5 MG/ML IJ SOLN: 5.0000 mg | Freq: Three times a day (TID) | INTRAMUSCULAR | Status: DC | PRN

## 2020-04-02 MED ORDER — ACETAMINOPHEN 325 MG PO TABS: 325.0000 mg | ORAL_TABLET | Freq: Four times a day (QID) | ORAL | Status: DC | PRN

## 2020-04-02 MED ORDER — PHENYLEPHRINE HCL-NACL 10-0.9 MG/250ML-% IV SOLN
INTRAVENOUS | Status: DC | PRN
  Administered 2020-04-02: 50 ug/min via INTRAVENOUS

## 2020-04-02 MED ORDER — HYDROMORPHONE HCL 1 MG/ML IJ SOLN
0.2500 mg | INTRAMUSCULAR | Status: DC | PRN
  Administered 2020-04-02 (×3): 0.5 mg via INTRAVENOUS

## 2020-04-02 MED ORDER — ONDANSETRON HCL 4 MG/2ML IJ SOLN
INTRAMUSCULAR | Status: AC
  Filled 2020-04-02: qty 2

## 2020-04-02 MED ORDER — CHLORHEXIDINE GLUCONATE CLOTH 2 % EX PADS
6.0000 | MEDICATED_PAD | Freq: Every day | CUTANEOUS | Status: DC
  Administered 2020-04-02: 6 via TOPICAL

## 2020-04-02 MED ORDER — MAGNESIUM CITRATE PO SOLN: 1.0000 | Freq: Once | ORAL | Status: DC | PRN

## 2020-04-02 MED ORDER — OXYCODONE HCL 5 MG PO TABS: 5.0000 mg | ORAL_TABLET | Freq: Once | ORAL | Status: DC | PRN

## 2020-04-02 MED ORDER — OXYCODONE HCL 5 MG/5ML PO SOLN: 5.0000 mg | Freq: Once | ORAL | Status: DC | PRN

## 2020-04-02 MED ORDER — TRANEXAMIC ACID-NACL 1000-0.7 MG/100ML-% IV SOLN
1000.0000 mg | Freq: Once | INTRAVENOUS | Status: AC
  Administered 2020-04-02: 1000 mg via INTRAVENOUS
  Filled 2020-04-02: qty 100

## 2020-04-02 MED ORDER — OXYCODONE HCL 5 MG PO TABS
5.0000 mg | ORAL_TABLET | ORAL | Status: DC | PRN
  Administered 2020-04-03 – 2020-04-04 (×2): 10 mg via ORAL
  Filled 2020-04-02 (×3): qty 2

## 2020-04-02 MED ORDER — GABAPENTIN 300 MG PO CAPS
300.0000 mg | ORAL_CAPSULE | Freq: Once | ORAL | Status: AC
  Administered 2020-04-02: 300 mg via ORAL
  Filled 2020-04-02: qty 1

## 2020-04-02 MED ORDER — SODIUM CHLORIDE (PF) 0.9 % IJ SOLN
INTRAMUSCULAR | Status: AC
  Filled 2020-04-02: qty 10

## 2020-04-02 MED ORDER — OXYCODONE HCL 5 MG PO TABS
10.0000 mg | ORAL_TABLET | ORAL | Status: DC | PRN
  Administered 2020-04-03 – 2020-04-04 (×4): 10 mg via ORAL
  Filled 2020-04-02: qty 3
  Filled 2020-04-02 (×2): qty 2

## 2020-04-02 MED ORDER — PHENYLEPHRINE HCL (PRESSORS) 10 MG/ML IV SOLN
INTRAVENOUS | Status: DC | PRN
  Administered 2020-04-02: 80 ug via INTRAVENOUS
  Administered 2020-04-02: 100 ug via INTRAVENOUS
  Administered 2020-04-02 (×2): 80 ug via INTRAVENOUS

## 2020-04-02 MED ORDER — AMISULPRIDE (ANTIEMETIC) 5 MG/2ML IV SOLN: 10.0000 mg | Freq: Once | INTRAVENOUS | Status: DC | PRN

## 2020-04-02 MED ORDER — LACTATED RINGERS IV SOLN: INTRAVENOUS | Status: DC

## 2020-04-02 MED ORDER — PROSOURCE PLUS PO LIQD
30.0000 mL | Freq: Every day | ORAL | Status: DC
  Administered 2020-04-02 – 2020-04-04 (×3): 30 mL via ORAL
  Filled 2020-04-02 (×3): qty 30

## 2020-04-02 MED ORDER — PANTOPRAZOLE SODIUM 40 MG PO TBEC: 40.0000 mg | DELAYED_RELEASE_TABLET | Freq: Every day | ORAL | Status: DC

## 2020-04-02 SURGICAL SUPPLY — 39 items
BIT DRILL AO GAMMA 4.2X180 (BIT) ×1 IMPLANT
CLSR STERI-STRIP ANTIMIC 1/2X4 (GAUZE/BANDAGES/DRESSINGS) ×1 IMPLANT
COVER MAYO STAND STRL (DRAPES) ×2 IMPLANT
COVER PERINEAL POST (MISCELLANEOUS) ×2 IMPLANT
COVER SURGICAL LIGHT HANDLE (MISCELLANEOUS) ×2 IMPLANT
DRAPE STERI IOBAN 125X83 (DRAPES) ×2 IMPLANT
DRESSING MEPILEX FLEX 4X4 (GAUZE/BANDAGES/DRESSINGS) IMPLANT
DRSG MEPILEX FLEX 4X4 (GAUZE/BANDAGES/DRESSINGS) ×6
DURAPREP 26ML APPLICATOR (WOUND CARE) ×2 IMPLANT
ELECT REM PT RETURN 15FT ADLT (MISCELLANEOUS) ×2 IMPLANT
GLOVE SURG ENC MOIS LTX SZ7.5 (GLOVE) ×2 IMPLANT
GLOVE SURG SYN 7.5  E (GLOVE) ×2
GLOVE SURG SYN 7.5 E (GLOVE) ×1 IMPLANT
GLOVE SURG SYN 7.5 PF PI (GLOVE) ×1 IMPLANT
GLOVE SURG UNDER POLY LF SZ7.5 (GLOVE) ×2 IMPLANT
GOWN STRL REUS W/ TWL LRG LVL3 (GOWN DISPOSABLE) ×2 IMPLANT
GOWN STRL REUS W/ TWL XL LVL3 (GOWN DISPOSABLE) ×2 IMPLANT
GOWN STRL REUS W/TWL LRG LVL3 (GOWN DISPOSABLE) ×4
GOWN STRL REUS W/TWL XL LVL3 (GOWN DISPOSABLE) ×4
GUIDEROD T2 3X1000 (ROD) ×1 IMPLANT
K-WIRE  3.2X450M STR (WIRE) ×4
K-WIRE 3.2X450M STR (WIRE) ×2
KIT BASIN OR (CUSTOM PROCEDURE TRAY) ×2 IMPLANT
KIT NAIL LONG 10X380MM (Orthopedic Implant) ×1 IMPLANT
KIT TURNOVER KIT A (KITS) ×2 IMPLANT
KWIRE 3.2X450M STR (WIRE) IMPLANT
MANIFOLD NEPTUNE II (INSTRUMENTS) ×2 IMPLANT
NS IRRIG 1000ML POUR BTL (IV SOLUTION) ×2 IMPLANT
PACK GENERAL/GYN (CUSTOM PROCEDURE TRAY) ×2 IMPLANT
PAD ARMBOARD 7.5X6 YLW CONV (MISCELLANEOUS) ×4 IMPLANT
REAMER SHAFT BIXCUT (INSTRUMENTS) ×1 IMPLANT
SCREW LAG GAMMA 3 TI 10.5X75MM (Screw) ×1 IMPLANT
SCREW LOCKING THREADED 5X47.5 (Screw) ×1 IMPLANT
SUT MNCRL AB 4-0 PS2 18 (SUTURE) ×2 IMPLANT
SUT VIC AB 2-0 CT1 27 (SUTURE) ×2
SUT VIC AB 2-0 CT1 TAPERPNT 27 (SUTURE) ×1 IMPLANT
TOWEL OR 17X26 10 PK STRL BLUE (TOWEL DISPOSABLE) ×2 IMPLANT
TOWEL OR NON WOVEN STRL DISP B (DISPOSABLE) ×2 IMPLANT
WATER STERILE IRR 1000ML POUR (IV SOLUTION) ×2 IMPLANT

## 2020-04-02 NOTE — Progress Notes (Signed)
Initial Nutrition Assessment  DOCUMENTATION CODES:   Not applicable  INTERVENTION:  - will order Ensure Surgery BID, each supplement provides 330 kcal and 18 grams protein. - will order 30 ml Prosource Plus once/day, each supplement provides 100 kcal and 15 grams protein.  - complete NFPE when feasible.   NUTRITION DIAGNOSIS:   Increased nutrient needs related to post-op healing as evidenced by estimated needs.  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 She presented to the ED due to several weeks of L hip and upper thigh pain.  Diet changed from Heart Healthy/Carb Modified to NPO at midnight. Documented intakes in the chart are 30% of lunch and 50% of dinner yesterday.  Patient now out of the room to OR for L IM femoral nail.   Weight yesterday was 175 lb, which appears to be a stated weight. PTA the most recently documented weight was on 08/17/19 when she weighed 202 lb. This would indicate 27 lb weight loss (13.4% body weight) in the past 7 months.    Labs reviewed; CBGs: 108 and 146 mg/dl. Medications reviewed; 1 tablet oscal-D TID, 2 mg folvite/day, 50 mg solu-medrol QID, sliding scale novolog, 120000 units creon TID, 40 mg oral protonix/day.  NUTRITION - FOCUSED PHYSICAL EXAM:  unable to complete at this time.   Diet Order:   Diet Order            Diet NPO time specified  Diet effective midnight                  EDUCATION NEEDS:   No education needs have been identified at this time  Skin:  Skin Assessment: Reviewed RN Assessment  Last BM:  PTA  Height:   Ht Readings from Last 1 Encounters:  04/01/20 5\' 5"  (1.651 m)    Weight:   Wt Readings from Last 1 Encounters:  04/01/20 79.4 kg     Estimated Nutritional Needs:  Kcal:  1985-2200 kcal Protein:  100-115 grams Fluid:  >/= 2 L/day      04/03/20, MS, RD, LDN, CNSC Inpatient Clinical Dietitian RD pager # available in [facility]  After hours/weekend pager # available in [facility]

## 2020-04-02 NOTE — Interval H&P Note (Signed)
History and Physical Interval Note:  04/02/2020 7:13 AM  [name]  has presented today for surgery, with the diagnosis of Closed fracture of proximal end of left femur, initial encounter.  The various methods of treatment have been discussed with the patient and family. After consideration of risks, benefits and other options for treatment, the patient has consented to  Procedure(s): INTRAMEDULLARY (IM) NAIL FEMORAL (Left) as a surgical intervention.  The patient's history has been reviewed, patient examined, no change in status, stable for surgery.  I have reviewed the patient's chart and labs.  Questions were answered to the patient's satisfaction.     Sheral Apley

## 2020-04-02 NOTE — Anesthesia Preprocedure Evaluation (Addendum)
Anesthesia Evaluation  Patient identified by MRN, date of birth, ID band Patient awake    Reviewed: Allergy & Precautions, NPO status , Patient's Chart, lab work & pertinent test results  History of Anesthesia Complications (+) PONV and history of anesthetic complications  Airway Mallampati: II  TM Distance: >3 FB Neck ROM: Full    Dental no notable dental hx.    Pulmonary asthma ,    Pulmonary exam normal breath sounds clear to auscultation       Cardiovascular + CAD and + Past MI  Normal cardiovascular exam Rhythm:Regular Rate:Normal  ECG: NSR, rate 72  ECHO: 1. Left ventricular ejection fraction, by estimation, is 60 to 65%. The left ventricle has normal function. The left ventrical has no regional wall motion abnormalities. Left ventricular diastolic parameters were normal.  2. Right ventricular systolic function is normal. The right ventricular size is normal. There is normal pulmonary artery systolic pressure.  3. The mitral valve is grossly normal. no evidence of mitral valve regurgitation. No evidence of mitral stenosis.  4. The aortic valve is normal in structure and function. Aortic valve regurgitation is trivial . No aortic stenosis is present.    Neuro/Psych PSYCHIATRIC DISORDERS Anxiety Depression Foot drop CVA, Residual Symptoms    GI/Hepatic Neg liver ROS, GERD  Medicated and Controlled,IBS (irritable bowel syndrome)   Endo/Other  Adrenal insufficiency  Renal/GU negative Renal ROS     Musculoskeletal  (+) Arthritis , Rheumatoid disorders,  Chronic back pain Behcet's syndrome   Abdominal   Peds  Hematology  (+) Sickle cell trait , REFUSES BLOOD PRODUCTS, JEHOVAH'S WITNESS  Anesthesia Other Findings Closed fracture of proximal end of left femur, initial encounter  Reproductive/Obstetrics                           Anesthesia Physical Anesthesia Plan  ASA: II  Anesthesia  Plan: General   Post-op Pain Management:    Induction: Intravenous  PONV Risk Score and Plan: 4 or greater and Ondansetron, Dexamethasone, Midazolam, Treatment may vary due to age or medical condition and Amisulpride  Airway Management Planned: LMA  Additional Equipment:   Intra-op Plan:   Post-operative Plan: Extubation in OR  Informed Consent: I have reviewed the patients History and Physical, chart, labs and discussed the procedure including the risks, benefits and alternatives for the proposed anesthesia with the patient or authorized representative who has indicated his/her understanding and acceptance.     Dental advisory given  Plan Discussed with: CRNA  Anesthesia Plan Comments: (Albumin acceptable per patient)       Anesthesia Quick Evaluation

## 2020-04-02 NOTE — Interval H&P Note (Signed)
History and Physical Interval Note:  04/02/2020 12:19 PM  [name]  has presented today for surgery, with the diagnosis of Closed fracture of proximal end of left femur, initial encounter.  The various methods of treatment have been discussed with the patient and family. After consideration of risks, benefits and other options for treatment, the patient has consented to  Procedure(s): INTRAMEDULLARY (IM) NAIL FEMORAL (Left) as a surgical intervention.  The patient's history has been reviewed, patient examined, no change in status, stable for surgery.  I have reviewed the patient's chart and labs.  Questions were answered to the patient's satisfaction.     Sheral Apley

## 2020-04-02 NOTE — Anesthesia Procedure Notes (Signed)
Procedure Name: Intubation Date/Time: 04/02/2020 1:00 PM Performed by: Garrel Ridgel, CRNA Pre-anesthesia Checklist: Patient identified, Emergency Drugs available, Suction available and Patient being monitored Patient Re-evaluated:Patient Re-evaluated prior to induction Oxygen Delivery Method: Circle system utilized Preoxygenation: Pre-oxygenation with 100% oxygen Induction Type: IV induction Ventilation: Mask ventilation without difficulty Laryngoscope Size: Mac and 3 Grade View: Grade I Tube type: Oral Tube size: 7.0 mm Number of attempts: 1 Airway Equipment and Method: Stylet and Oral airway Placement Confirmation: ETT inserted through vocal cords under direct vision,  positive ETCO2 and breath sounds checked- equal and bilateral Secured at: 21 cm Dental Injury: Teeth and Oropharynx as per pre-operative assessment

## 2020-04-02 NOTE — Transfer of Care (Signed)
Immediate Anesthesia Transfer of Care Note  Patient: [name]  Procedure(s) Performed: INTRAMEDULLARY (IM) NAIL FEMORAL (Left )  Patient Location: PACU  Anesthesia Type:General  Level of Consciousness: awake, alert , oriented and patient cooperative  Airway & Oxygen Therapy: Patient Spontanous Breathing and Patient connected to face mask oxygen  Post-op Assessment: Report given to RN and Post -op Vital signs reviewed and stable  Post vital signs: Reviewed and stable  Last Vitals:  Vitals Value Taken Time  BP 129/78 04/02/20 1456  Temp    Pulse 70 04/02/20 1458  Resp 8 04/02/20 1458  SpO2 99 % 04/02/20 1458  Vitals shown include unvalidated device data.  Last Pain:  Vitals:   04/02/20 1232  TempSrc: Oral  PainSc:       Patients Stated Pain Goal: 3 (04/02/20 1230)  Complications: No complications documented.

## 2020-04-02 NOTE — Anesthesia Postprocedure Evaluation (Signed)
Anesthesia Post Note  Patient: [name]  Procedure(s) Performed: INTRAMEDULLARY (IM) NAIL FEMORAL (Left )     Patient location during evaluation: PACU Anesthesia Type: General Level of consciousness: awake and alert, oriented and patient cooperative Pain management: pain level controlled Vital Signs Assessment: post-procedure vital signs reviewed and stable Respiratory status: spontaneous breathing, nonlabored ventilation and respiratory function stable Cardiovascular status: blood pressure returned to baseline and stable Postop Assessment: no apparent nausea or vomiting Anesthetic complications: no   No complications documented.  Last Vitals:  Vitals:   04/02/20 1545 04/02/20 1600  BP: 107/64 100/73  Pulse: 69 69  Resp: (!) 8 16  Temp:    SpO2: 97% 97%    Last Pain:  Vitals:   04/02/20 1545  TempSrc:   PainSc: 4                  Lannie Fields

## 2020-04-02 NOTE — Progress Notes (Signed)
PROGRESS NOTE    [name]  ZOX:[id number] DOB: [birth year] DOA: 04/01/2020 PCP: Gardner Candle, MD    Brief Narrative: HPI per Dr. 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  Patient went to let her dog outside and while walking she suddenly stopped.  When she did this she felt a pop in her left leg and fell to the ground.  No LOC, no head injury.  Last took prednisone at 9 am yesterday. She has intolerance to morphine and fentanyl and therefore was given ketamine in route by EMS.  Currently with complaints of muscle spasms in her leg.  Admits to intact sensation of her left foot.  No fever or chills or any other complaints.   ED Course: Afebrile, hemodynamically stable, on room air. Notable Labs: Sodium 140, K3.4, glucose 104, BUN 13, creatinine 0.58, calcium 8.1, WBC 6.7, Hb 10.1, COVID-19 pending. Notable Imaging: Left hip XR-oblique, displaced, angulated and foreshortened fracture of proximal left femoral diaphysis concerning for pathologic fracture and widening of pubic symphysis concerning for additional injury. Patient received Valium 5 mg, Dilaudid 0.5 mg x 3, KCl 40 mEq, calcium carbonate p.o.   Assessment & Plan:   Principal Problem:   Femur fracture (HCC) Active Problems:   Behcet's disease (HCC)   IBS (irritable bowel syndrome)   Iatrogenic adrenal insufficiency (HCC)   Pancreatic insufficiency   #1 left hip fracture with abnormal pubic symphysis.-she reports that  she was about to walk the dog and she heard a pop in her hip and then she fell down on her left side of the hip.  No loss of consciousness.  She has history of osteoporosis severe and possibly some genetic bone disorders in the family with multiple people having hip and knee replacements and multiple bone surgeries. Currently on Buck's traction planning for OR today. She has had surgery previously in her SI joint. Continue Dilaudid and Robaxin for pain control. Continue Solu-Cortef 50 mg every 6 for stress dose steroids.  #2 Behcet's syndrome on chronic steroids at home.  Restart aspirin 325 mg twice a day for vasculitis she was taking at home after surgery.  Followed at [facility].  #3 pancreatic insufficiency continue Creon tablets  #4 adrenal insufficiency continue stress dose steroids  #5 rheumatoid arthritis on Humira and leflunomide  #6  Osteoporosis follow-up with endocrinology  #7 history of POTS on propranolol   Estimated body mass index is 29.12 kg/m as calculated from the following:   Height as of this encounter:  (1.651 m).   Weight as of this encounter: 79.4 kg.  DVT prophylaxis: None now as patient going to the OR Code Status: Full code  family Communication: None at bedside she reported her son will be coming in this morning Disposition Plan:  Status is: Inpatient  Dispo: The patient is from: HOME              Anticipated d/c is to HOME              Patient currently is not medically stable to d/c.  Difficult to place patient NA   Consultants: Ortho  Procedures: None Antimicrobials: None Subjective: Pleasant young female in no acute distress resting in bed with traction in place complains of pain with minimal movements Objective: Vitals:   04/01/20 2103 04/02/20 0057 04/02/20 0542 04/02/20 0919  BP: 130/75 130/81 132/89 123/77  Pulse: 70 72 68 67  Resp: 16 16 16    Temp: 98.4 F (36.9 C) 98.6 F (37 C) 97.8 F (36.6 C) 98.4 F (36.9 C)   TempSrc: Oral Oral Oral Oral  SpO2: 98% 98% 100% 100%  Weight:      Height:        Intake/Output Summary (Last 24 hours) at 04/02/2020 1101 Last data filed at 04/02/2020 0600 Gross per 24 hour  Intake 720 ml  Output 1400 ml  Net -680 ml   Filed Weights   04/01/20 0700  Weight: 79.4 kg    Examination:  General exam: Appears calm and comfortable  Respiratory system: Clear to auscultation. Respiratory effort normal. Cardiovascular system: S1 & S2 heard, RRR. No JVD, murmurs, rubs, gallops or clicks. No pedal edema. Gastrointestinal system: Abdomen is nondistended, soft and nontender. No organomegaly or masses felt. Normal bowel sounds heard. Central nervous system: Alert and oriented. No focal neurological deficits. Extremities: Left lower extremity in traction Skin: No rashes, lesions or ulcers Psychiatry: Judgement and insight appear normal. Mood & affect appropriate.     Data Reviewed: I have personally reviewed following labs and imaging studies  CBC: Recent Labs  Lab 04/01/20 0719 04/02/20 0314  WBC 6.7 12.6*  NEUTROABS 3.6  --   HGB 10.1* 12.8  HCT 31.4* 39.6  MCV 94.9 92.5  PLT PLATELET CLUMPS NOTED ON SMEAR, UNABLE TO ESTIMATE PLATELET CLUMPS NOTED ON SMEAR, COUNT APPEARS DECREASED   Basic Metabolic Panel: Recent Labs  Lab 04/01/20 0719 04/02/20 0314  NA 140 139  K 3.4* 4.1  CL 110 105  CO2 24 28  GLUCOSE 104* 116*  BUN 13 9  CREATININE 0.58 0.51  CALCIUM 8.1* 9.2   GFR: Estimated Creatinine Clearance: 80.8 mL/min (by C-G formula based on SCr of 0.51 mg/dL). Liver Function Tests: No results for input(s): AST, ALT, ALKPHOS, BILITOT, PROT, ALBUMIN in the last 168 hours. No results for input(s): LIPASE, AMYLASE in the last 168 hours. No results for input(s): AMMONIA in the last 168 hours. Coagulation Profile: No results for input(s): INR, PROTIME in the last 168 hours. Cardiac Enzymes: No results for input(s): CKTOTAL, CKMB, CKMBINDEX, TROPONINI  in the last 168 hours. BNP (last 3 results) No results for input(s): PROBNP in the last 8760 hours. HbA1C: Recent Labs    04/01/20 0719  HGBA1C 5.8*   CBG: Recent Labs  Lab 04/01/20 1205 04/01/20 1643 04/01/20 2004 04/02/20 0821  GLUCAP 114* 126* 153* 108*   Lipid Profile: No results for input(s): CHOL, HDL, LDLCALC, TRIG, CHOLHDL, LDLDIRECT in the last 72 hours. Thyroid Function Tests: No results for input(s): TSH, T4TOTAL, FREET4, T3FREE, THYROIDAB in the last 72 hours. Anemia Panel: No results for input(s): VITAMINB12, FOLATE, FERRITIN, TIBC, IRON, RETICCTPCT in the last 72 hours. Sepsis Labs: No results for input(s): PROCALCITON, LATICACIDVEN in the last 168 hours.  Recent Results (from the past 240 hour(s))  Resp Panel by RT-PCR (Flu A&B, Covid) Nasopharyngeal Swab     Status: None   Collection Time: 04/01/20  8:32 AM   Specimen: Nasopharyngeal Swab; Nasopharyngeal(NP) swabs in vial transport medium  Result Value Ref Range Status   SARS Coronavirus 2  by RT PCR NEGATIVE NEGATIVE Final    Comment: (NOTE) SARS-CoV-2 target nucleic acids are NOT DETECTED.  The SARS-CoV-2 RNA is generally detectable in upper respiratory specimens during the acute phase of infection. The lowest concentration of SARS-CoV-2 viral copies this assay can detect is 138 copies/mL. A negative result does not preclude SARS-Cov-2 infection and should not be used as the sole basis for treatment or other patient management decisions. A negative result may occur with  improper specimen collection/handling, submission of specimen other than nasopharyngeal swab, presence of viral mutation(s) within the areas targeted by this assay, and inadequate number of viral copies(<138 copies/mL). A negative result must be combined with clinical observations, patient history, and epidemiological information. The expected result is Negative.  Fact Sheet for Patients:   BloggerCourse.com  Fact Sheet for Healthcare Providers:  SeriousBroker.it  This test is no t yet approved or cleared by the [place] FDA and  has been authorized for detection and/or diagnosis of SARS-CoV-2 by FDA under an Emergency Use Authorization (EUA). This EUA will remain  in effect (meaning this test can be used) for the duration of the COVID-19 declaration under Section 564(b)(1) of the Act, 21 U.S.C.section 360bbb-3(b)(1), unless the authorization is terminated  or revoked sooner.       Influenza A by PCR NEGATIVE NEGATIVE Final   Influenza B by PCR NEGATIVE NEGATIVE Final    Comment: (NOTE) The Xpert Xpress SARS-CoV-2/FLU/RSV plus assay is intended as an aid in the diagnosis of influenza from Nasopharyngeal swab specimens and should not be used as a sole basis for treatment. Nasal washings and aspirates are unacceptable for Xpert Xpress SARS-CoV-2/FLU/RSV testing.  Fact Sheet for Patients: BloggerCourse.com  Fact Sheet for Healthcare Providers: SeriousBroker.it  This test is not yet approved or cleared by the [place] FDA and has been authorized for detection and/or diagnosis of SARS-CoV-2 by FDA under an Emergency Use Authorization (EUA). This EUA will remain in effect (meaning this test can be used) for the duration of the COVID-19 declaration under Section 564(b)(1) of the Act, 21 U.S.C. section 360bbb-3(b)(1), unless the authorization is terminated or revoked.  Performed at [facility], [address], [place], [place] [address]   Surgical PCR screen     Status: Abnormal   Collection Time: 04/02/20  4:00 AM   Specimen: Nasal Mucosa; Nasal Swab  Result Value Ref Range Status   MRSA, PCR POSITIVE (A) NEGATIVE Final    Comment: ANDERSON C. 03.29.22 @ 0555 BY MECIAL J. RESULT CALLED TO, READ BACK BY AND VERIFIED WITH:     Staphylococcus aureus POSITIVE (A) NEGATIVE Final    Comment: RESULT CALLED TO, READ BACK BY AND VERIFIED WITH: ANDERSON C. 03.29.22 @ 0555 BY MECIAL J. (NOTE) The Xpert SA Assay (FDA approved for NASAL specimens in patients 59 years of age and older), is one component of a comprehensive surveillance program. It is not intended to diagnose infection nor to guide or monitor treatment. Performed at [facility], [address] [place], [place] [address]          Radiology Studies: DG Chest 1 View  Result Date: 04/01/2020 CLINICAL DATA:  Hip fracture EXAM: CHEST  1 VIEW COMPARISON:  02/15/2019 FINDINGS: Normal heart size and mediastinal contours. There is no edema, consolidation, effusion, or pneumothorax. No acute osseous finding. ACDF. IMPRESSION: No evidence of active disease. Electronically Signed   By: Marnee Spring M.D.   On: 04/01/2020 08:03   DG Hip Unilat  W or Wo Pelvis 2-3 Views Left  Result Date: 04/01/2020 CLINICAL DATA:  Fracture EXAM: DG HIP (WITH OR WITHOUT PELVIS) 2-3V LEFT COMPARISON:  None. FINDINGS: There is an oblique, displaced, angulated, and foreshortened fracture of the most proximal left femoral diaphysis. There is widening of the pubic symphysis. The partially imaged proximal right femur is intact. Postoperative findings of lower lumbar fusion and bilateral sacroiliac fusion. IMPRESSION: 1. There is an oblique, displaced, angulated, and foreshortened fracture of the most proximal left femoral diaphysis. In general, this location and reported low energy mechanism raise high suspicion for pathologic fracture; there is no obvious evidence of osseous lesion by radiographs. 2. There is widening of the pubic symphysis, concerning for additional injury. Electronically Signed   By: Lauralyn Primes M.D.   On: 04/01/2020 08:07        Scheduled Meds: . acetaminophen  1,000 mg Oral Once  . aspirin EC  325 mg Oral BID  . calcium-vitamin D  1 tablet Oral  TID  . Chlorhexidine Gluconate Cloth  6 each Topical Daily  . colchicine  0.6 mg Oral BID  . DULoxetine  60 mg Oral Daily  . feeding supplement  296 mL Oral Once  . folic acid  2 mg Oral Daily  . gabapentin  100 mg Oral TID  . gabapentin  300 mg Oral Once  . hydrocortisone sod succinate (SOLU-CORTEF) inj  50 mg Intravenous Q6H  . insulin aspart  0-9 Units Subcutaneous TID WC  . leflunomide  20 mg Oral Daily  . lipase/protease/amylase  120,000 Units Oral TID with meals  . mupirocin ointment  1 application Nasal BID  . pantoprazole  40 mg Oral Daily  . propranolol  80 mg Oral BID   Continuous Infusions: .  ceFAZolin (ANCEF) IV    . methocarbamol (ROBAXIN) IV    . tranexamic acid       LOS: 1 day   Alwyn Ren, MD  04/02/2020, 11:01 AM

## 2020-04-02 NOTE — Progress Notes (Signed)
Spoke to [employer] and received report for upcoming surgery later today.

## 2020-04-03 ENCOUNTER — Encounter (HOSPITAL_COMMUNITY): Payer: Self-pay | Admitting: Orthopedic Surgery

## 2020-04-03 LAB — COMPREHENSIVE METABOLIC PANEL
ALT: 20 U/L (ref 0–44)
AST: 30 U/L (ref 15–41)
Albumin: 3.1 g/dL — ABNORMAL LOW (ref 3.5–5.0)
Alkaline Phosphatase: 42 U/L (ref 38–126)
Anion gap: 8 (ref 5–15)
BUN: 16 mg/dL (ref 6–20)
CO2: 28 mmol/L (ref 22–32)
Calcium: 8.6 mg/dL — ABNORMAL LOW (ref 8.9–10.3)
Chloride: 104 mmol/L (ref 98–111)
Creatinine, Ser: 0.67 mg/dL (ref 0.44–1.00)
GFR, Estimated: >60 mL/min (ref >60)
Glucose, Bld: 123 mg/dL — ABNORMAL HIGH (ref 70–99)
Potassium: 3.6 mmol/L (ref 3.5–5.1)
Sodium: 140 mmol/L (ref 135–145)
Total Bilirubin: 0.7 mg/dL (ref 0.3–1.2)
Total Protein: 6.4 g/dL — ABNORMAL LOW (ref 6.5–8.1)

## 2020-04-03 LAB — CBC
HCT: 33.1 % — ABNORMAL LOW (ref 36.0–46.0)
Hemoglobin: 10.7 g/dL — ABNORMAL LOW (ref 12.0–15.0)
MCH: 30.5 pg (ref 26.0–34.0)
MCHC: 32.3 g/dL (ref 30.0–36.0)
MCV: 94.3 fL (ref 80.0–100.0)
Platelets: 124 10*3/uL — ABNORMAL LOW (ref 150–400)
RBC: 3.51 MIL/uL — ABNORMAL LOW (ref 3.87–5.11)
RDW: 13.3 % (ref 11.5–15.5)
WBC: 11.5 10*3/uL — ABNORMAL HIGH (ref 4.0–10.5)
nRBC: 0 % (ref 0.0–0.2)

## 2020-04-03 LAB — GLUCOSE, CAPILLARY
Glucose-Capillary: 112 mg/dL — ABNORMAL HIGH (ref 70–99)
Glucose-Capillary: 105 mg/dL — ABNORMAL HIGH (ref 70–99)
Glucose-Capillary: 120 mg/dL — ABNORMAL HIGH (ref 70–99)
Glucose-Capillary: 102 mg/dL — ABNORMAL HIGH (ref 70–99)

## 2020-04-03 LAB — PLATELET BY CITRATE

## 2020-04-03 MED ORDER — PANCRELIPASE (LIP-PROT-AMYL) 12000-38000 UNITS PO CPEP: 72000.0000 [IU] | ORAL_CAPSULE | Freq: Three times a day (TID) | ORAL | Status: DC | PRN

## 2020-04-03 MED ORDER — PREDNISONE 5 MG PO TABS: 10.0000 mg | ORAL_TABLET | Freq: Every day | ORAL | Status: DC

## 2020-04-03 MED ORDER — HYDROCORTISONE NA SUCCINATE PF 100 MG IJ SOLR
25.0000 mg | Freq: Three times a day (TID) | INTRAMUSCULAR | Status: AC
  Administered 2020-04-03 (×2): 25 mg via INTRAVENOUS
  Filled 2020-04-03 (×2): qty 0.5

## 2020-04-03 MED ORDER — PREDNISONE 20 MG PO TABS
40.0000 mg | ORAL_TABLET | Freq: Every day | ORAL | Status: DC
  Administered 2020-04-04: 40 mg via ORAL
  Filled 2020-04-03 (×2): qty 2

## 2020-04-03 NOTE — Progress Notes (Signed)
Occupational Therapy Evaluation  Patient's son lives with her in a townhouse, patient able to live on main level with 1 small step into garage. Patient's son is a Archivist, therefore not home all the time. At baseline patient is independent with self care and mobility without adaptive device. Currently patient limited by pain, decreased activity tolerance, balance and safety awareness. Patient min G assist for functional transfers with cues for L LE management and hand placement. Patient needing mod to max A for LB ADLs due to pain, may benefit from initial adaptive equipment use at home. Recommend continued acute OT services to maximize patient safety and independence in order to facilitate D/C to venue listed below.    04/03/20 1036  OT Visit Information  Last OT Received On 04/03/20  Assistance Needed +1  PT/OT/SLP Co-Evaluation/Treatment Yes  Reason for Co-Treatment To address functional/ADL transfers  OT goals addressed during session ADL's and self-care  History of Present Illness 60 year old female  with a past medical history of Behcet's syndrome on chronic prednisone and high-dose aspirin, POTS, pancreatic insufficiency, RA , adrenal insufficiency (secondary to chronic prednisone use), IBS, MI, sickle cell trait, CVA, mononeuritis multiplex, who presented to the ED 3/29/29022 after walking to let dog out, she suddenly stopped  and  felt a pop in her left leg and fell to the ground.  Xray-There is an oblique, displaced, angulated, and foreshortened fracture of the most proximal left femoral diaphysis. S/P IM nail 04/02/2020.  Precautions  Precautions Fall  Restrictions  Weight Bearing Restrictions Yes  LLE Weight Bearing WBAT  Home Living  Family/patient expects to be discharged to: Private residence  Living Arrangements Children  Available Help at Discharge Family;Available PRN/intermittently  Type of Home Other(Comment) (townhouse)  Home Access Stairs to enter  Entrance  Stairs-Number of Steps 1 small step to garage  Home Layout Two level;Able to live on main level with bedroom/bathroom  Bathroom Shower/Tub Walk-in shower;Tub/shower unit  Corporate treasurer - built in;Walker - 2 wheels;Walker - 4 wheels;Wheelchair - manual  Prior Function  Level of Independence Independent  Communication  Communication No difficulties  Pain Assessment  Pain Assessment Faces  Faces Pain Scale 6  Pain Location left inner thigh spasm  Pain Descriptors / Indicators Spasm  Pain Intervention(s) Premedicated before session  Cognition  Arousal/Alertness Awake/alert  Behavior During Therapy WFL for tasks assessed/performed  Overall Cognitive Status Within Functional Limits for tasks assessed  Upper Extremity Assessment  Upper Extremity Assessment Overall WFL for tasks assessed  Lower Extremity Assessment  Lower Extremity Assessment Defer to PT evaluation  Cervical / Trunk Assessment  Cervical / Trunk Assessment Normal  ADL  Overall ADL's  Needs assistance/impaired  Eating/Feeding Independent;Sitting  Grooming Set up;Sitting  Upper Body Bathing Set up;Sitting  Lower Body Bathing Moderate assistance;Sitting/lateral leans;Sit to/from stand  Upper Body Dressing  Set up;Sitting  Lower Body Dressing Maximal assistance;Sitting/lateral leans;Sit to/from stand  Lower Body Dressing Details (indicate cue type and reason) to don socks, patient having muscle spasms  Toilet Transfer Min guard;Cueing for sequencing;Ambulation;RW (BSC over toilet)  Toilet Transfer Details (indicate cue type and reason) cues to extend L LE when sitting onto toilet to minimize pain, cues for hand placement  Toileting- Clothing Manipulation and Hygiene Min guard;Sitting/lateral lean;Sit to/from stand  Functional mobility during ADLs Min guard;Cueing for sequencing;Rolling walker  General ADL Comments patient requiring increased assistance with self care tasks  due to pain in L LE, decreased balance,  activity tolerance and safety awareness  Vision- History  Baseline Vision/History Legally blind;Wears glasses  Wears Glasses At all times  Patient Visual Report No change from baseline  Bed Mobility  Overal bed mobility Needs Assistance  Bed Mobility Supine to Sit  Supine to sit Supervision  General bed mobility comments patient self assisted left leg using right  Transfers  Overall transfer level Needs assistance  Equipment used Rolling walker (2 wheeled)  Transfers Sit to/from Stand  Sit to Stand Min guard  General transfer comment cues for hand  and LLE position for sit/stand  Balance  Overall balance assessment Needs assistance  Sitting-balance support No upper extremity supported;Feet supported  Sitting balance-Leahy Scale Good  Standing balance support During functional activity;Bilateral upper extremity supported  Standing balance-Leahy Scale Fair  Standing balance comment able to stand statically without UE support  OT - End of Session  Equipment Utilized During Treatment Gait belt;Rolling walker  Activity Tolerance Patient tolerated treatment well  Patient left in chair;with call bell/phone within reach;with chair alarm set  Nurse Communication Mobility status  OT Assessment  OT Recommendation/Assessment Patient needs continued OT Services  OT Visit Diagnosis Pain;Other abnormalities of gait and mobility (R26.89)  Pain - Right/Left Left  Pain - part of body Leg  OT Problem List Decreased activity tolerance;Impaired balance (sitting and/or standing);Decreased safety awareness;Pain  OT Plan  OT Frequency (ACUTE ONLY) Min 2X/week  OT Treatment/Interventions (ACUTE ONLY) Self-care/ADL training;DME and/or AE instruction;Therapeutic activities;Patient/family education;Balance training  AM-PAC OT "6 Clicks" Daily Activity Outcome Measure (Version 2)  Help from another person eating meals? 4  Help from another person taking care of  personal grooming? 3  Help from another person toileting, which includes using toliet, bedpan, or urinal? 3  Help from another person bathing (including washing, rinsing, drying)? 2  Help from another person to put on and taking off regular upper body clothing? 3  Help from another person to put on and taking off regular lower body clothing? 2  6 Click Score 17  OT Recommendation  Follow Up Recommendations Home health OT;Supervision - Intermittent  OT Equipment None recommended by OT  Individuals Consulted  Consulted and Agree with Results and Recommendations Patient  Acute Rehab OT Goals  Patient Stated Goal to go home  OT Goal Formulation With patient  Time For Goal Achievement 04/17/20  Potential to Achieve Goals Good  OT Time Calculation  OT Start Time (ACUTE ONLY) 0815  OT Stop Time (ACUTE ONLY) 0846  OT Time Calculation (min) 31 min  OT General Charges  $OT Visit 1 Visit  OT Evaluation  $OT Eval Low Complexity 1 Low  Written Expression  Dominant Hand Right   Marlyce Huge OT OT pager: [phone]

## 2020-04-03 NOTE — Evaluation (Signed)
Physical Therapy Evaluation Patient Details Name: [name] MRN: [id number] DOB: 1960/04/15 Today's Date: 04/03/2020   History of Present Illness  60 year old female  with a past medical history of Behcet's syndrome on chronic prednisone and high-dose aspirin, POTS, pancreatic insufficiency, RA , adrenal insufficiency (secondary to chronic prednisone use), IBS, MI, sickle cell trait, CVA, mononeuritis multiplex, who presented to the ED 3/29/29022 after walking to let dog out, she suddenly stopped  and  felt a pop in her left leg and fell to the ground.  Xray-There is an oblique, displaced, angulated, and foreshortened fracture of the most proximal left femoral diaphysis. S/P IM nail 04/02/2020.  Clinical Impression  The patient  Is very eager to mobilize. Ambulated x 40'  Then 20' in room. Patient lives with college age son and plans to return home. Patient has DME.   Pt admitted with above diagnosis.  Pt currently with functional limitations due to the deficits listed below (see PT Problem List). Pt will benefit from skilled PT to increase their independence and safety with mobility to allow discharge to the venue listed below.        Follow Up Recommendations Home health PT    Equipment Recommendations  None recommended by PT    Recommendations for Other Services       Precautions / Restrictions Precautions Precautions: Fall Restrictions LLE Weight Bearing: Weight bearing as tolerated      Mobility  Bed Mobility Overal bed mobility: Needs Assistance Bed Mobility: Supine to Sit     Supine to sit: Supervision     General bed mobility comments: patient self assisted left leg using right    Transfers Overall transfer level: Needs assistance   Transfers: Sit to/from Stand Sit to Stand: Min guard         General transfer comment: cues for hand  and LLE position for sit/stand  Ambulation/Gait Ambulation/Gait assistance: Min guard Gait Distance (Feet):  40 Feet (then 20) Assistive device: Rolling walker (2 wheeled) Gait Pattern/deviations: Step-to pattern;Decreased stance time - left;Decreased step length - left Gait velocity: decr   General Gait Details: cues for sequence, patient picking up Rw more than rolling, patient reports that she has been on a walker before.  Stairs            Wheelchair Mobility    Modified Rankin (Stroke Patients Only)       Balance Overall balance assessment: Needs assistance;History of Falls Sitting-balance support: No upper extremity supported;Feet supported Sitting balance-Leahy Scale: Good     Standing balance support: During functional activity;Bilateral upper extremity supported Standing balance-Leahy Scale: Fair                               Pertinent Vitals/Pain Pain Assessment: Faces Faces Pain Scale: Hurts even more Pain Location: left inner thigh spasm Pain Descriptors / Indicators: Spasm Pain Intervention(s): Monitored during session;Premedicated before session;Ice applied;Repositioned    Home Living Family/patient expects to be discharged to:: Private residence Living Arrangements: Children Available Help at Discharge: Family;Available PRN/intermittently Type of Home: Other(Comment) (townhouse) Home Access: Stairs to enter   [employer] of Steps: 1 small step to garage Home Layout: One level Home Equipment: Shower seat - built in;Walker - 2 wheels;Walker - 4 wheels;Wheelchair - manual      Prior Function Level of Independence: Independent               Hand Dominance  Dominant Hand: Right    Extremity/Trunk Assessment        Lower Extremity Assessment Lower Extremity Assessment: LLE deficits/detail LLE Deficits / Details: patient moved  left leg with right to move to bed edge. Bears ~ 50% wt    Cervical / Trunk Assessment Cervical / Trunk Assessment: Normal  Communication   Communication: No difficulties  Cognition  Arousal/Alertness: Awake/alert Behavior During Therapy: WFL for tasks assessed/performed Overall Cognitive Status: Within Functional Limits for tasks assessed                                        General Comments      Exercises     Assessment/Plan    PT Assessment Patient needs continued PT services  PT Problem List Decreased strength;Decreased mobility;Decreased safety awareness;Decreased range of motion;Decreased knowledge of precautions;Decreased activity tolerance;Decreased balance;Pain       PT Treatment Interventions DME instruction;Therapeutic activities;Gait training;Therapeutic exercise;Patient/family education;Functional mobility training    PT Goals (Current goals can be found in the Care Plan section)  Acute Rehab PT Goals Patient Stated Goal: to go home PT Goal Formulation: With patient Time For Goal Achievement: 04/17/20 Potential to Achieve Goals: Good    Frequency Min 6X/week   Barriers to discharge        Co-evaluation PT/OT/SLP Co-Evaluation/Treatment: Yes Reason for Co-Treatment: For patient/therapist safety PT goals addressed during session: Mobility/safety with mobility OT goals addressed during session: ADL's and self-care       AM-PAC PT "6 Clicks" Mobility  Outcome Measure Help needed turning from your back to your side while in a flat bed without using bedrails?: A Little Help needed moving from lying on your back to sitting on the side of a flat bed without using bedrails?: A Little Help needed moving to and from a bed to a chair (including a wheelchair)?: A Little Help needed standing up from a chair using your arms (e.g., wheelchair or bedside chair)?: A Little Help needed to walk in hospital room?: A Little Help needed climbing 3-5 steps with a railing? : A Little 6 Click Score: 18    End of Session Equipment Utilized During Treatment: Gait belt Activity Tolerance: Patient tolerated treatment well Patient left: in  chair;with call bell/phone within reach;with chair alarm set Nurse Communication: Mobility status PT Visit Diagnosis: Muscle weakness (generalized) (M62.81);Difficulty in walking, not elsewhere classified (R26.2);Pain Pain - Right/Left: Left Pain - part of body: Hip    Time: 4163-8453 PT Time Calculation (min) (ACUTE ONLY): 31 min   Charges:   PT Evaluation $PT Eval Low Complexity: 1 Low          Blanchard Kelch PT Acute Rehabilitation Services Pager [phone] Office [phone]   Rada Hay 04/03/2020, 10:36 AM

## 2020-04-03 NOTE — TOC Initial Note (Signed)
Transition of Care [facility]) - Initial/Assessment Note    Patient Details  Name: Angelia Hazell Cobb-Dean MRN: [id number] Date of Birth: 11-May-1960  [facility] CM/SW Contact:    Lennart Pall, LCSW Phone Number: 04/03/2020, 3:01 PM  Clinical Narrative:                 Met with pt today to review dc needs.  Pt confirms that she has an adult son in the home who is able to provide intermittent support.  She does need DME - rw and 3n1 ordered via [place].  Therapies have recommended HHPT and OT but await MD orders.  Will follow for any additional dc needs.  Expected Discharge Plan: [place] Barriers to Discharge: Continued Medical Work up   Patient Goals and CMS Choice Patient states their goals for this hospitalization and ongoing recovery are:: return home      Expected Discharge Plan and Services Expected Discharge Plan: [place] Acute Care Choice: [facility] arrangements for the past 2 months: Single Family Home                 DME Arranged: 3-N-1,Walker rolling DME Agency: [employer] Date DME Agency Contacted: 04/03/20   Representative spoke with at DME Agency: Alex/ Ovid Curd            Prior Living Arrangements/Services Living arrangements for the past 2 months: Single Family Home Lives with:: Adult Children Patient language and need for interpreter reviewed:: Yes Do you feel safe going back to the place where you live?: Yes      Need for Family Participation in Patient Care: Yes (Comment) Care giver support system in place?: Yes (comment)   Criminal Activity/Legal Involvement Pertinent to Current Situation/Hospitalization: No - Comment as needed  Activities of Daily Living Home Assistive Devices/Equipment: Eyeglasses,Wheelchair ADL Screening (condition at time of admission) Patient's cognitive ability adequate to safely complete daily activities?: Yes Is the patient deaf or have difficulty hearing?:  No Does the patient have difficulty seeing, even when wearing glasses/contacts?: No Does the patient have difficulty concentrating, remembering, or making decisions?: No Patient able to express need for assistance with ADLs?: Yes Does the patient have difficulty dressing or bathing?: Yes Independently performs ADLs?: No Communication: Independent Dressing (OT): Needs assistance Is this a change from baseline?: Change from baseline, expected to last >3 days Grooming: Needs assistance Is this a change from baseline?: Change from baseline, expected to last >3 days Feeding: Needs assistance Is this a change from baseline?: Change from baseline, expected to last >3 days Bathing: Needs assistance Is this a change from baseline?: Change from baseline, expected to last >3 days Toileting: Needs assistance Is this a change from baseline?: Change from baseline, expected to last >3days In/Out Bed: Needs assistance Is this a change from baseline?: Change from baseline, expected to last >3 days Walks in Home: Needs assistance Is this a change from baseline?: Change from baseline, expected to last >3 days Does the patient have difficulty walking or climbing stairs?: Yes (secondary to fractured left femur) Weakness of Legs: Left Weakness of Arms/Hands: None  Permission Sought/Granted                  Emotional Assessment Appearance:: Appears stated age Attitude/Demeanor/Rapport: Engaged,Self-Confident Affect (typically observed): Accepting,Pleasant Orientation: : Oriented to Self,Oriented to Place,Oriented to  Time,Oriented to Situation Alcohol / Substance Use: Not Applicable Psych Involvement: No (comment)  Admission diagnosis:  Femur fracture (HCC) [S72.90XA] Closed fracture of proximal end of left femur, initial encounter [facility]) [S72.002A] Patient Active Problem List   Diagnosis Date Noted  . Femur fracture ([place]) 04/01/2020  . Pancreatic insufficiency 04/01/2020  . Tremor 08/04/2019   . Abnormal cardiovascular stress test 08/04/2019  . Hypokalemia 02/15/2019  . Chest pain 02/15/2019  . Iatrogenic adrenal insufficiency ([place]) 01/15/2018  . Orthostatic hypotension 12/07/2013  . Adrenal insufficiency ([place]) 09/25/2013  . Palpitations 09/16/2013  . Cervical stenosis of spinal canal 08/22/2013  . Lumbar degenerative disc disease 12/30/2012  . Tear of lateral meniscus of right knee, current   . Osteopenia   . Lumbar back pain 09/13/2012  . Subjective visual disturbance 07/14/2012  . Endometriosis   . Endometrial polyp   . IBS (irritable bowel syndrome)   . Acid reflux   . Colitis   . Behcet's syndrome ([place])   . Glucose intolerance (impaired glucose tolerance) 12/19/2010  . Behcet's disease ([place]) 01/06/2004   PCP:  Evalee Mutton, MD Pharmacy:   [place], [place] - [address] [place] [address] Phone: [phone] Fax: [phone]  [facility] [place], [place] [address] [place] [address] Phone: [phone] Fax: [phone]     Social Determinants of Health (SDOH) Interventions    Readmission Risk Interventions No flowsheet data found.

## 2020-04-03 NOTE — Progress Notes (Signed)
Patient refused to have Foley Catheter removed at this time. Stated she will wait for first shift. Tomasa Hose

## 2020-04-03 NOTE — Op Note (Signed)
DATE OF SURGERY:  04/03/2020  TIME: 8:38 AM  PATIENT NAME:  [name]  AGE: 60 y.o.  PRE-OPERATIVE DIAGNOSIS:  Closed fracture of proximal end of left femur, initial encounter  POST-OPERATIVE DIAGNOSIS:  SAME  PROCEDURE:  INTRAMEDULLARY (IM) NAIL FEMORAL  SURGEON:  Sheral Apley  ASSISTANT:  Levester Fresh, PA-C, he was present and scrubbed throughout the case, critical for completion in a timely fashion, and for retraction, instrumentation, and closure.   OPERATIVE IMPLANTS: Stryker Gamma Nail  PREOPERATIVE INDICATIONS:  [name] is a 60 y.o. year old who fell and suffered a hip fracture. She was brought into the ER and then admitted and optimized and then elected for surgical intervention.    The risks benefits and alternatives were discussed with the patient including but not limited to the risks of nonoperative treatment, versus surgical intervention including infection, bleeding, nerve injury, malunion, nonunion, hardware prominence, hardware failure, need for hardware removal, blood clots, cardiopulmonary complications, morbidity, mortality, among others, and they were willing to proceed.    OPERATIVE PROCEDURE:  The patient was brought to the operating room and placed in the supine position. General anesthesia was administered. She was placed on the fracture table.  Closed reduction was performed under C-arm guidance. Time out was then performed after sterile prep and drape. She received preoperative antibiotics.  Incision was made proximal to the greater trochanter. A guidewire was placed in the appropriate position. Confirmation was made on AP and lateral views. The above-named nail was opened. I opened the proximal femur with a reamer. I then placed the nail by hand easily down. I did not need to ream the femur.  Once the nail was completely seated, I placed a guidepin into the femoral head into the center center position. I measured the  length, and then reamed the lateral cortex and up into the head. I then placed the lag screw. Slight compression was applied. Anatomic fixation achieved. Bone quality was mediocre.  I then secured the proximal interlocking bolt, and took off a half a turn, and then removed the instruments, and took final C-arm pictures AP and lateral the entire length of the leg.  I then used perfect circles technique to place a distal interlock screw.   Anatomic reconstruction was achieved, and the wounds were irrigated copiously and closed with Vicryl followed by staples and sterile gauze for the skin. The patient was awakened and returned to PACU in stable and satisfactory condition. There no complications and the patient tolerated the procedure well.  She will be weightbearing as tolerated, and will be on chemical px  for a period of four weeks after discharge.   Margarita Rana, M.D.

## 2020-04-03 NOTE — Progress Notes (Signed)
PROGRESS NOTE    [name]  QIO:[id number] DOB: 05-25-1960 DOA: 04/01/2020 PCP: Gardner Candle, MD    Brief Narrative:  [name] is a 60 year old female Jehovah's Witness (refuses whole blood products but okay with some derivatives)with a past medical history of Behcet's syndrome on chronic prednisoneand high-dose aspirin,POTS, pancreatic insufficiency,RA onHumiraand leflunomide, adrenal insufficiency(secondary to chronic prednisone use), IBS, MI secondary to immunosuppressant therapy, sickle cell trait, CVA, mononeuritis multiplex, who presented to the ED with several weeks of left hip/upper thigh pain which acutely worsened this morning. Patient went to let her dog outside and while walking she suddenly stopped. When she did this she felt a pop in her left leg and fell to the ground. No LOC, no head injury. Last took prednisone at 9 am yesterday.She has intolerance to morphine and fentanyl and therefore was given ketamine in route by EMS.Currently with complaints of muscle spasms in her leg. Admits to intact sensation of her left foot. No fever or chills or any other complaints.  In the ED, Afebrile, hemodynamically stable, on room air. Notable Labs:Sodium 140, K3.4, glucose 104, BUN 13, creatinine 0.58, calcium 8.1, WBC 6.7, Hb 10.1, COVID-19 pending. Notable Imaging:Left hip XR-oblique, displaced, angulated and foreshortened fracture of proximal left femoral diaphysis concerning for pathologic fracture and widening of pubic symphysis concerning for additional injury. Patient receivedValium 5 mg, Dilaudid 0.5 mg x3, KCl , calcium carbonate p.o.orthopedics consulted.  Hospital service consulted for further evaluation and management of acute femur fracture.   Assessment & Plan:   Principal Problem:   Femur fracture (HCC) Active Problems:   Behcet's disease (HCC)   IBS (irritable bowel syndrome)   Iatrogenic adrenal insufficiency  (HCC)   Pancreatic insufficiency   Left femur fracture s/p ORIF/IMN Patient presenting to the ED with left lower extremity pain.  X-ray notable for displaced proximal left femoral diaphysis fracture.  History of osteoporosis and some genetic bone disorders in her family.  Orthopedics was consulted and patient underwent ORIF with IM nail by Dr. Eulah Pont on 04/02/2020. --Following, appreciate assistance --WBAT left lower extremity --Continue PT/OT efforts --Tylenol 325-650 mg every 6 hours as needed mild pain --Oxycodone 5-10 g every 4 hour as needed moderate pain --Oxycodone 10-15 mg p.o. every 4 hours severe pain --Dilaudid 0.5-1 mg IV every 4 hours as needed severe pain not relieved with oral oxycodone --Robaxin 500 mg p.o. every 6 hours as needed muscle spasms --Aspirin 325mg  BID (ortho may add Xarelto x 30 days on discharge) --Follow-up orthopedics 1 week following discharge  Behcet's syndrome Follows with [facility] and [facility].  On prednisone 10 mg p.o. daily and aspirin 325 mg p.o. twice daily at home. --Continue aspirin 325 mg p.o. twice daily --Continue tapering off stress dose steroids, plan to transition to 40 mg prednisone tomorrow --Outpatient follow-up  Adrenal insufficiency From chronic prednisone use.  On prednisone 10 mg p.o. daily at home. --Taper Solu-Cortef to 25 mg IV q8h today; followed by prednisone 40 mg p.o. daily on 04/04/2020; and will decrease prednisone dose daily by 10 mg until she is back on her normal 10 mg p.o. daily dose.  Pancreatic insufficiency: Continue Creon  IBS: Hyoscyamine 0.125 mg sublingual every 6 hours as needed cramping  Rheumatoid arthritis: On Humira and leflunomide  Osteoporosis: Outpatient follow-up with endocrinology  History of POTS: Continue propranolol   DVT prophylaxis: SCDs   Code Status: Full Code Family Communication: No family present at bedside this morning  Disposition Plan:  Level  of care:  Med-Surg Status is: Inpatient  Remains inpatient appropriate because:IV treatments appropriate due to intensity of illness or inability to take PO and Inpatient level of care appropriate due to severity of illness   Dispo: The patient is from: Home              Anticipated d/c is to: Home              Patient currently is not medically stable to d/c.   Difficult to place patient No   Consultants:   Orthopedics, Dr. Eulah Pont  Procedures:   ORIF/IM nail left femur 04/02/2020, Dr. Eulah Pont  Antimicrobials:   Perioperative cefazolin   Subjective: Patient seen and examined bedside, resting comfortably; lying in bed.  Reports poor pain control overnight.  Received pain medication and Robaxin this morning with adequate response.  Going to work with PT/OT today.  Concerned about her steroid taper given her history of adrenal insufficiency.  No other complaints or concerns at this time.  Denies headache, no chest pain, no palpitations, no shortness of breath, no abdominal pain, no weakness, no fatigue, no paresthesias.  No acute events overnight per nursing staff.  Objective: Vitals:   04/02/20 2142 04/03/20 0120 04/03/20 0546 04/03/20 1013  BP: 118/63 99/61 129/88 134/79  Pulse: 73 75 81 74  Resp: Temp: 98.6 F (37 C) 98.2 F (36.8 C) 98.8 F (37.1 C) 97.9 F (36.6 C)  TempSrc:    Oral  SpO2: 99% 97% 100% 99%  Weight:      Height:        Intake/Output Summary (Last 24 hours) at 04/03/2020 1235 Last data filed at 04/03/2020 1154 Gross per 24 hour  Intake 1281.67 ml  Output 1950 ml  Net -668.33 ml   Filed Weights   04/01/20 0700  Weight: 79.4 kg    Examination:  General exam: Appears calm and comfortable  Respiratory system: Clear to auscultation. Respiratory effort normal.  On room air Cardiovascular system: S1 & S2 heard, RRR. No JVD, murmurs, rubs, gallops or clicks. No pedal edema. Gastrointestinal system: Abdomen is nondistended, soft and nontender. No  organomegaly or masses felt. Normal bowel sounds heard. Central nervous system: Alert and oriented. No focal neurological deficits. Extremities: Symmetric 5 x 5 power. Skin: No rashes, lesions or ulcers dressing noted, clean/dry/intact Psychiatry: Judgement and insight appear normal. Mood & affect appropriate.     Data Reviewed: I have personally reviewed following labs and imaging studies  CBC: Recent Labs  Lab 04/01/20 0719 04/02/20 0314 04/03/20 0643  WBC 6.7 12.6* 11.5*  NEUTROABS 3.6  --   --   HGB 10.1* 12.8 10.7*  HCT 31.4* 39.6 33.1*  MCV 94.9 92.5 94.3  PLT PLATELET CLUMPS NOTED ON SMEAR, UNABLE TO ESTIMATE PLATELET CLUMPS NOTED ON SMEAR, COUNT APPEARS DECREASED 124*   Basic Metabolic Panel: Recent Labs  Lab 04/01/20 0719 04/02/20 0314 04/03/20 0643  NA 140 139 140  K 3.4* 4.1 3.6  CL 110 105 104  CO2 GLUCOSE 104* 116* 123*  BUN CREATININE 0.58 0.51 0.67  CALCIUM 8.1* 9.2 8.6*   GFR: Estimated Creatinine Clearance: 80.8 mL/min (by C-G formula based on SCr of 0.67 mg/dL). Liver Function Tests: Recent Labs  Lab 04/03/20 0643  AST 30  ALT 20  ALKPHOS 42  BILITOT 0.7  PROT 6.4*  ALBUMIN 3.1*   No results for input(s): LIPASE, AMYLASE in the last 168 hours.  No results for input(s): AMMONIA in the last 168 hours. Coagulation Profile: No results for input(s): INR, PROTIME in the last 168 hours. Cardiac Enzymes: No results for input(s): CKTOTAL, CKMB, CKMBINDEX, TROPONINI in the last 168 hours. BNP (last 3 results) No results for input(s): PROBNP in the last 8760 hours. HbA1C: Recent Labs    04/01/20 0719  HGBA1C 5.8*   CBG: Recent Labs  Lab 04/02/20 1204 04/02/20 1702 04/02/20 2139 04/03/20 0757 04/03/20 1146  GLUCAP 146* 112* 125* 112* 105*   Lipid Profile: No results for input(s): CHOL, HDL, LDLCALC, TRIG, CHOLHDL, LDLDIRECT in the last 72 hours. Thyroid Function Tests: No results for input(s): TSH, T4TOTAL, FREET4,  T3FREE, THYROIDAB in the last 72 hours. Anemia Panel: No results for input(s): VITAMINB12, FOLATE, FERRITIN, TIBC, IRON, RETICCTPCT in the last 72 hours. Sepsis Labs: No results for input(s): PROCALCITON, LATICACIDVEN in the last 168 hours.  Recent Results (from the past 240 hour(s))  Resp Panel by RT-PCR (Flu A&B, Covid) Nasopharyngeal Swab     Status: None   Collection Time: 04/01/20  8:32 AM   Specimen: Nasopharyngeal Swab; Nasopharyngeal(NP) swabs in vial transport medium  Result Value Ref Range Status   SARS Coronavirus 2 by RT PCR NEGATIVE NEGATIVE Final    Comment: (NOTE) SARS-CoV-2 target nucleic acids are NOT DETECTED.  The SARS-CoV-2 RNA is generally detectable in upper respiratory specimens during the acute phase of infection. The lowest concentration of SARS-CoV-2 viral copies this assay can detect is 138 copies/mL. A negative result does not preclude SARS-Cov-2 infection and should not be used as the sole basis for treatment or other patient management decisions. A negative result may occur with  improper specimen collection/handling, submission of specimen other than nasopharyngeal swab, presence of viral mutation(s) within the areas targeted by this assay, and inadequate number of viral copies(<138 copies/mL). A negative result must be combined with clinical observations, patient history, and epidemiological information. The expected result is Negative.  Fact Sheet for Patients:  BloggerCourse.com  Fact Sheet for Healthcare Providers:  SeriousBroker.it  This test is no t yet approved or cleared by the [place] FDA and  has been authorized for detection and/or diagnosis of SARS-CoV-2 by FDA under an Emergency Use Authorization (EUA). This EUA will remain  in effect (meaning this test can be used) for the duration of the COVID-19 declaration under Section 564(b)(1) of the Act, 21 U.S.C.section 360bbb-3(b)(1),  unless the authorization is terminated  or revoked sooner.       Influenza A by PCR NEGATIVE NEGATIVE Final   Influenza B by PCR NEGATIVE NEGATIVE Final    Comment: (NOTE) The Xpert Xpress SARS-CoV-2/FLU/RSV plus assay is intended as an aid in the diagnosis of influenza from Nasopharyngeal swab specimens and should not be used as a sole basis for treatment. Nasal washings and aspirates are unacceptable for Xpert Xpress SARS-CoV-2/FLU/RSV testing.  Fact Sheet for Patients: BloggerCourse.com  Fact Sheet for Healthcare Providers: SeriousBroker.it  This test is not yet approved or cleared by the [place] FDA and has been authorized for detection and/or diagnosis of SARS-CoV-2 by FDA under an Emergency Use Authorization (EUA). This EUA will remain in effect (meaning this test can be used) for the duration of the COVID-19 declaration under Section 564(b)(1) of the Act, 21 U.S.C. section 360bbb-3(b)(1), unless the authorization is terminated or revoked.  Performed at [facility], [address] [place], [place] [address]   Surgical PCR screen     Status: Abnormal  Collection Time: 04/02/20  4:00 AM   Specimen: Nasal Mucosa; Nasal Swab  Result Value Ref Range Status   MRSA, PCR POSITIVE (A) NEGATIVE Final    Comment: ANDERSON C. 03.29.22 @ 0555 BY MECIAL J. RESULT CALLED TO, READ BACK BY AND VERIFIED WITH:    Staphylococcus aureus POSITIVE (A) NEGATIVE Final    Comment: RESULT CALLED TO, READ BACK BY AND VERIFIED WITH: ANDERSON C. 03.29.22 @ 0555 BY MECIAL J. (NOTE) The Xpert SA Assay (FDA approved for NASAL specimens in patients 76 years of age and older), is one component of a comprehensive surveillance program. It is not intended to diagnose infection nor to guide or monitor treatment. Performed at [facility], [address] [place], [place] [address]          Radiology  Studies: Pelvis Portable  Result Date: 04/02/2020 CLINICAL DATA:  Postop. EXAM: PORTABLE PELVIS 1-2 VIEWS COMPARISON:  Preoperative radiograph yesterday. FINDINGS: Intramedullary nail with trans trochanteric screw fixation of subtrochanteric femur fracture, in improved alignment from preoperative imaging. The distal aspect of the femoral nail is not included in the field of view. Recent postsurgical change includes air and edema in the lateral soft tissues. IMPRESSION: Intramedullary nail and trans trochanteric screw fixation of subtrochanteric femur fracture, in improved alignment from preoperative imaging. Distal aspect of the intramedullary nail is not included in this pelvis field of view. Electronically Signed   By: Narda Rutherford M.D.   On: 04/02/2020 16:29   DG C-Arm 1-60 Min-No Report  Result Date: 04/02/2020 Fluoroscopy was utilized by the requesting physician.  No radiographic interpretation.   DG FEMUR MIN 2 VIEWS LEFT  Result Date: 04/02/2020 CLINICAL DATA:  Fracture, left femur IM nail. EXAM: LEFT FEMUR 2 VIEWS; DG C-ARM 1-60 MIN-NO REPORT COMPARISON:  Preoperative hip radiograph yesterday. FINDINGS: Four fluoroscopic spot views of the left femur obtained. Intramedullary nail with distal locking and trans trochanteric screw fixation of proximal femur fracture. Fracture is in improved alignment from preoperative imaging. Total fluoroscopy time 1 minutes 8 seconds. IMPRESSION: Intraoperative fluoroscopy during left femur fracture ORIF. Electronically Signed   By: Narda Rutherford M.D.   On: 04/02/2020 15:26        Scheduled Meds: . (feeding supplement) PROSource Plus  30 mL Oral Daily  . acetaminophen  1,000 mg Oral Q6H  . aspirin EC  325 mg Oral BID  . calcium-vitamin D  1 tablet Oral TID  . Chlorhexidine Gluconate Cloth  6 each Topical Daily  . colchicine  0.6 mg Oral BID  . docusate sodium  100 mg Oral BID  . DULoxetine  60 mg Oral Daily  . feeding supplement  237 mL Oral  BID BM  . folic acid  2 mg Oral Daily  . hydrocortisone sod succinate (SOLU-CORTEF) inj  25 mg Intravenous Q8H  . insulin aspart  0-9 Units Subcutaneous TID WC  . leflunomide  20 mg Oral Daily  . lipase/protease/amylase  120,000 Units Oral TID with meals  . mupirocin ointment  1 application Nasal BID  . pantoprazole  40 mg Oral Daily  . [START ON 04/04/2020] predniSONE  40 mg Oral Daily  . propranolol  80 mg Oral BID  . traMADol  50 mg Oral Q6H   Continuous Infusions: . methocarbamol (ROBAXIN) IV    . methocarbamol (ROBAXIN) IV       LOS: 2 days    Time spent: 36 minutes spent on chart review, discussion with nursing staff, consultants, updating family and  interview/physical exam; more than 50% of that time was spent in counseling and/or coordination of care.    Alvira Philips [place], DO [facility] Available via Epic secure chat 7am-7pm After these hours, please refer to coverage provider listed on [email or web address] 04/03/2020, 12:35 PM

## 2020-04-03 NOTE — Progress Notes (Signed)
Physical Therapy Treatment Patient Details Name: [name] MRN: [id number] DOB: [birth year] Today's Date: 04/03/2020    History of Present Illness 60 year old female  with a past medical history of Behcet's syndrome on chronic prednisone and high-dose aspirin, POTS, pancreatic insufficiency, RA , adrenal insufficiency (secondary to chronic prednisone use), IBS, MI, sickle cell trait, CVA, mononeuritis multiplex, who presented to the ED 3/29/29022 after walking to let dog out, she suddenly stopped  and  felt a pop in her left leg and fell to the ground.  Xray-There is an oblique, displaced, angulated, and foreshortened fracture of the most proximal left femoral diaphysis. S/P IM nail 04/02/2020.    PT Comments    Patient reports noted discomfort medial left knee when flexing to ~ 60 * during heelslides, also feels a "pop" in thigh muscle when actively moving.   Patient reports inner thigh spasms intermittently , noted while ambulating as patient suddenly stops and jerks and moans. Occurs with sit to stand from  Raised toilet, also..  Patient will benefit from  PT to improve ambulation safety, practice in/out of bed(high surface) and 1 step prior to DC. Marland Kitchen   Follow Up Recommendations  Home health PT     Equipment Recommendations  None recommended by PT    Recommendations for Other Services       Precautions / Restrictions Precautions Precautions: Fall Restrictions LLE Weight Bearing: Weight bearing as tolerated    Mobility  Bed Mobility   Bed Mobility: Sit to Supine       Sit to supine: Min assist   General bed mobility comments: patient attempted use og belt around foot to lift leg, did not get leg up. Min assist to place leg onto bed. Instructed to try both feet in belt to lift simultaneously    Transfers Overall transfer level: Needs assistance Equipment used: Rolling walker (2 wheeled) Transfers: Sit to/from Stand Sit to Stand: Min guard          General transfer comment: cues for hand  and LLE position for sit/stand, C/o intermittent spasm in inner thigh, especially during sit-stand.  Ambulation/Gait Ambulation/Gait assistance: Min guard Gait Distance (Feet): 20 Feet (x2) Assistive device: Rolling walker (2 wheeled) Gait Pattern/deviations: Step-to pattern;Step-through pattern;Decreased stance time - left;Decreased step length - left     General Gait Details: Pt. reports intermittent spasms, noted when patient suddenly stops and jerks while ambulating occurred multiple times.   Stairs             Wheelchair Mobility    Modified Rankin (Stroke Patients Only)       Balance                                            Cognition Arousal/Alertness: Awake/alert                                            Exercises General Exercises - Lower Extremity Ankle Circles/Pumps: AROM;Both;10 reps;Supine Short Arc Quad: AROM;Left;10 reps;Supine Heel Slides: AAROM;Left;10 reps;Supine (used belt to assist) Hip ABduction/ADduction: AAROM;Left;10 reps;Supine (used belt to self assist)    General Comments        Pertinent Vitals/Pain Faces Pain Scale: Hurts whole lot Pain Location: left inner thigh spasm Pain Descriptors / Indicators:  Spasm Pain Intervention(s): Monitored during session;Premedicated before session;Ice applied;Repositioned    Home Living                      Prior Function            PT Goals (current goals can now be found in the care plan section) Progress towards PT goals: Progressing toward goals    Frequency    Min 6X/week      PT Plan Current plan remains appropriate    Co-evaluation              AM-PAC PT "6 Clicks" Mobility   Outcome Measure  Help needed turning from your back to your side while in a flat bed without using bedrails?: A Little Help needed moving from lying on your back to sitting on the side of a flat bed without  using bedrails?: A Little Help needed moving to and from a bed to a chair (including a wheelchair)?: A Little Help needed standing up from a chair using your arms (e.g., wheelchair or bedside chair)?: A Little     6 Click Score: 12    End of Session   Activity Tolerance: Patient tolerated treatment well Patient left: in bed;with call bell/phone within reach;with bed alarm set;with family/visitor present Nurse Communication: Mobility status PT Visit Diagnosis: Muscle weakness (generalized) (M62.81);Difficulty in walking, not elsewhere classified (R26.2);Pain Pain - Right/Left: Left Pain - part of body: Hip     Time: 1530-1615 PT Time Calculation (min) (ACUTE ONLY): 45 min  Charges:  $Gait Training: 8-22 mins $Therapeutic Exercise: 8-22 mins $Self Care/Home Management: 8-22                     Blanchard Kelch PT Acute Rehabilitation Services Pager [phone] Office [phone]    Rada Hay 04/03/2020, 4:40 PM

## 2020-04-03 NOTE — Progress Notes (Signed)
    Subjective: Patient says she had a tough night. The RN overnight was not responding to the patient's requests for pain medicine and muscle relaxers. It was difficult for her to sleep due to the constant pain. This morning she received her medicines and reports pain as mild to moderate right now. Tolerating diet. Urinating. No CP, SOB.  Feels itchy. Has not worked with PT/OT on mobilization [name] yet.  Objective:   VITALS:   Vitals:   04/02/20 1832 04/02/20 2142 04/03/20 0120 04/03/20 0546  BP: 108/71 118/63 99/61 129/88  Pulse: 69 73 75 81  Resp: 16 16 14 16   Temp: 97.6 F (36.4 C) 98.6 F (37 C) 98.2 F (36.8 C) 98.8 F (37.1 C)  TempSrc: Oral     SpO2: 100% 99% 97% 100%  Weight:      Height:       CBC Latest Ref Rng & Units 04/03/2020 04/02/2020 04/01/2020  WBC 4.0 - 10.5 K/uL 11.5(H) 12.6(H) 6.7  Hemoglobin 12.0 - 15.0 g/dL 10.7(L) 12.8 10.1(L)  Hematocrit 36.0 - 46.0 % 33.1(L) 39.6 31.4(L)  Platelets 150 - 400 K/uL 124(L) PLATELET CLUMPS NOTED ON SMEAR, COUNT APPEARS DECREASED PLATELET CLUMPS NOTED ON SMEAR, UNABLE TO ESTIMATE   BMP Latest Ref Rng & Units 04/03/2020 04/02/2020 04/01/2020  Glucose 70 - 99 mg/dL 04/03/2020) 169(C) 789(F)  BUN 6 - 20 mg/dL 16 9 13   Creatinine 0.44 - 1.00 mg/dL 810(F 7.51  Sodium 135 - 145 mmol/L 140 139 140  Potassium 3.5 - 5.1 mmol/L 3.6 4.1 3.4(L)  Chloride 98 - 111 mmol/L 104 105 110  CO2 22 - 32 mmol/L 28 28 24   Calcium 8.9 - 10.3 mg/dL 0.25) 9.2 8.1(L)   Intake/Output      03/29 0701 03/30 0700 03/30 0701 03/31 0700   P.O.     I.V. (mL/kg) 741.7 (9.3)    IV Piggyback 300    Total Intake(mL/kg) 1041.7 (13.1)    Urine (mL/kg/hr) 2400 (1.3)    Blood 100    Total Output 2500    Net -1458.3            Physical Exam: General: NAD.  Laying in bed.  Resp: No increased wob Cardio: regular rate and rhythm ABD soft Neurologically intact MSK Neurovascularly intact Sensation intact distally Intact pulses  distally Dorsiflexion/Plantar flexion intact Incision: dressing C/D/I   Assessment: 1 Day Post-Op  S/P Procedure(s) (LRB): INTRAMEDULLARY (IM) NAIL FEMORAL (Left) by Dr. 4/30. 4/30 on 04/02/20  Principal Problem:   Femur fracture (HCC) Active Problems:   Behcet's disease (HCC)   IBS (irritable bowel syndrome)   Iatrogenic adrenal insufficiency (HCC)   Pancreatic insufficiency   Plan: Advance diet Up with therapy Incentive Spirometry Elevate and Apply ice  Weightbearing: WBAT LLE Insicional and dressing care: Dressings left intact until follow-up and Reinforce dressings as needed Orthopedic device(s): None Showering: Keep dressing dry VTE prophylaxis: Aspirin 325mg  BID normally takes at home. Will possibly also add Xarelto x 30 days once d/c. , SCDs, ambulation Pain control: Continue current regimen Follow - up plan: 1 week Contact information:  Jewel Baize MD, Eulah Pont PA-C  Dispo: Home possibly later today. Waiting on PT/OT evals    04/04/20 Office 04/03/2020, 8:03 AM

## 2020-04-04 ENCOUNTER — Other Ambulatory Visit (HOSPITAL_COMMUNITY): Payer: Self-pay | Admitting: Internal Medicine

## 2020-04-04 LAB — GLUCOSE, CAPILLARY
Glucose-Capillary: 96 mg/dL (ref 70–99)
Glucose-Capillary: 117 mg/dL — ABNORMAL HIGH (ref 70–99)

## 2020-04-04 MED ORDER — POTASSIUM CHLORIDE CRYS ER 20 MEQ PO TBCR
20.0000 meq | EXTENDED_RELEASE_TABLET | Freq: Three times a day (TID) | ORAL | Status: DC
  Administered 2020-04-04 (×2): 20 meq via ORAL
  Filled 2020-04-04 (×2): qty 1

## 2020-04-04 MED ORDER — DOCUSATE SODIUM 100 MG PO CAPS: 100.0000 mg | ORAL_CAPSULE | Freq: Two times a day (BID) | ORAL | 0 refills | Status: DC

## 2020-04-04 MED ORDER — RIVAROXABAN 10 MG PO TABS
10.0000 mg | ORAL_TABLET | Freq: Every day | ORAL | 0 refills | Status: DC
Start: 2020-04-05 — End: 2020-04-05

## 2020-04-04 MED ORDER — PREDNISONE 5 MG PO TABS: 30.0000 mg | ORAL_TABLET | Freq: Every day | ORAL | Status: DC

## 2020-04-04 MED ORDER — RIVAROXABAN 10 MG PO TABS
10.0000 mg | ORAL_TABLET | Freq: Every day | ORAL | Status: DC
  Filled 2020-04-04: qty 1

## 2020-04-04 MED ORDER — PREDNISONE 10 MG PO TABS: ORAL_TABLET | ORAL | 0 refills | Status: DC

## 2020-04-04 MED ORDER — PREDNISONE 10 MG PO TABS: 10.0000 mg | ORAL_TABLET | Freq: Every day | ORAL | Status: DC

## 2020-04-04 MED ORDER — OXYCODONE HCL 5 MG PO TABS: 5.0000 mg | ORAL_TABLET | ORAL | 0 refills | Status: DC | PRN

## 2020-04-04 MED ORDER — METHOCARBAMOL 500 MG PO TABS: 500.0000 mg | ORAL_TABLET | Freq: Four times a day (QID) | ORAL | 0 refills | Status: DC | PRN

## 2020-04-04 MED ORDER — MAGNESIUM OXIDE 400 (241.3 MG) MG PO TABS
400.0000 mg | ORAL_TABLET | Freq: Three times a day (TID) | ORAL | Status: DC
  Administered 2020-04-04 (×2): 400 mg via ORAL
  Filled 2020-04-04 (×2): qty 1

## 2020-04-04 MED FILL — METHOCARBAMOL 500 MG TABS: 500 | 7 days supply | Qty: 28 | Fill #0

## 2020-04-04 MED FILL — XARELTO 10 MG TABLET: 10 | 30 days supply | Qty: 30 | Fill #0

## 2020-04-04 MED FILL — oxyCODONE HCL 5 MG TABS: 5 | 5 days supply | Qty: 30 | Fill #0

## 2020-04-04 MED FILL — predniSONE 10 MG TABS: 10 | 12 days supply | Qty: 41 | Fill #0

## 2020-04-04 NOTE — Care Management Important Message (Signed)
Important Message  Patient Details IM Letter given to the Patient. Name: [name] MRN: [id number] Date of Birth: July 09, 1960   Medicare Important Message Given:  Yes     Caren Macadam 04/04/2020, 12:15 PM

## 2020-04-04 NOTE — Progress Notes (Signed)
PROGRESS NOTE    [name]  VQQ:[id number] DOB: [birth year] DOA: 04/01/2020 PCP: Gardner Candle, MD    Brief Narrative:  [name] is a 60 year old female Jehovah's Witness (refuses whole blood products but okay with some derivatives)with a past medical history of Behcet's syndrome on chronic prednisoneand high-dose aspirin,POTS, pancreatic insufficiency,RA onHumiraand leflunomide, adrenal insufficiency(secondary to chronic prednisone use), IBS, MI secondary to immunosuppressant therapy, sickle cell trait, CVA, mononeuritis multiplex, who presented to the ED with several weeks of left hip/upper thigh pain which acutely worsened this morning. Patient went to let her dog outside and while walking she suddenly stopped. When she did this she felt a pop in her left leg and fell to the ground. No LOC, no head injury. Last took prednisone at 9 am yesterday.She has intolerance to morphine and fentanyl and therefore was given ketamine in route by EMS.Currently with complaints of muscle spasms in her leg. Admits to intact sensation of her left foot. No fever or chills or any other complaints.  In the ED, Afebrile, hemodynamically stable, on room air. Notable Labs:Sodium 140, K3.4, glucose 104, BUN 13, creatinine 0.58, calcium 8.1, WBC 6.7, Hb 10.1, COVID-19 pending. Notable Imaging:Left hip XR-oblique, displaced, angulated and foreshortened fracture of proximal left femoral diaphysis concerning for pathologic fracture and widening of pubic symphysis concerning for additional injury. Patient receivedValium 5 mg, Dilaudid 0.5 mg x3, KCl , calcium carbonate p.o.orthopedics consulted.  Hospital service consulted for further evaluation and management of acute femur fracture.   Assessment & Plan:   Principal Problem:   Femur fracture (HCC) Active Problems:   Behcet's disease (HCC)   IBS (irritable bowel syndrome)   Iatrogenic adrenal insufficiency  (HCC)   Pancreatic insufficiency   Left femur fracture s/p ORIF/IMN Patient presenting to the ED with left lower extremity pain.  X-ray notable for displaced proximal left femoral diaphysis fracture.  History of osteoporosis and some genetic bone disorders in her family.  Orthopedics was consulted and patient underwent ORIF with IM nail by Dr. Eulah Pont on 04/02/2020. --Following, appreciate assistance --WBAT left lower extremity --Continue PT/OT efforts --Tylenol 325-650 mg every 6 hours as needed mild pain --Oxycodone 5-10 g every 4 hour as needed moderate pain --Oxycodone 10-15 mg p.o. every 4 hours severe pain --Dilaudid 0.5-1 mg IV every 4 hours as needed severe pain not relieved with oral oxycodone --Robaxin 500 mg p.o. every 6 hours as needed muscle spasms --Aspirin 325mg  BID, Xarelto 10mg  PO daily x 30 days --Follow-up orthopedics 2 weeks following discharge  Behcet's syndrome Follows with [facility] and [place].  On prednisone 10 mg p.o. daily and aspirin 325 mg p.o. twice daily at home. --Continue aspirin 325 mg p.o. twice daily --Continue tapering off stress dose steroids, now on prednisone 40mg  today with plan 10mg  taper daily until home dose of 10mg  PO daily --Outpatient follow-up PCP/Endocrine/Rheum  Adrenal insufficiency From chronic prednisone use.  On prednisone 10 mg p.o. daily at home. --Continue steriod taper, prednisone 40 mg p.o. today; and will decrease prednisone dose daily by 10 mg until she is back on her normal 10 mg p.o. daily dose.  Pancreatic insufficiency: Continue Creon  IBS: Hyoscyamine 0.125 mg sublingual every 6 hours as needed cramping  Rheumatoid arthritis: On Humira and leflunomide  Osteoporosis: Outpatient follow-up with endocrinology  History of POTS: Continue propranolol   DVT prophylaxis: SCDs   Code Status: Full Code Family Communication: No family present at bedside this morning  Disposition Plan:  Level of  care:  Med-Surg Status is: Inpatient  Remains inpatient appropriate because:IV treatments appropriate due to intensity of illness or inability to take PO and Inpatient level of care appropriate due to severity of illness   Dispo: The patient is from: Home              Anticipated d/c is to: Home              Patient currently is not medically stable to d/c.   Difficult to place patient No   Consultants:   Orthopedics, Dr. Eulah Pont  Procedures:   ORIF/IM nail left femur 04/02/2020, Dr. Eulah Pont  Antimicrobials:   Perioperative cefazolin   Subjective: Patient seen and examined bedside, resting comfortably; lying in bed.  Patient concerned about "popping" sensation localized to her left anterior thigh.  She reports unable to reproduce sensation while lying in bed and occurred while performing PT yesterday.  Also requesting restart of her home potassium and magnesium supplements. No other complaints or concerns at this time.  Denies headache, no chest pain, no palpitations, no shortness of breath, no abdominal pain, no weakness, no fatigue, no paresthesias.  No acute events overnight per nursing staff.  Objective: Vitals:   04/03/20 1344 04/03/20 1755 04/03/20 2117 04/04/20 0446  BP: 128/77 124/76 (!) 112/56 121/67  Pulse: 76 74 70 72  Resp: 18 16 18 15   Temp: 98.1 F (36.7 C) 98.4 F (36.9 C) 98.1 F (36.7 C) 98.1 F (36.7 C)  TempSrc:   Oral Oral  SpO2: 99% 100% 94% 96%  Weight:      Height:        Intake/Output Summary (Last 24 hours) at 04/04/2020 1028 Last data filed at 04/04/2020 0713 Gross per 24 hour  Intake 560 ml  Output 1550 ml  Net -990 ml   Filed Weights   04/01/20 0700  Weight: 79.4 kg    Examination:  General exam: Appears calm and comfortable  Respiratory system: Clear to auscultation. Respiratory effort normal.  On room air Cardiovascular system: S1 & S2 heard, RRR. No JVD, murmurs, rubs, gallops or clicks. No pedal edema. Gastrointestinal system: Abdomen  is nondistended, soft and nontender. No organomegaly or masses felt. Normal bowel sounds heard. Central nervous system: Alert and oriented. No focal neurological deficits. Extremities: Symmetric 5 x 5 power. Skin: No rashes, lesions or ulcers, surgical dressing noted, clean/dry/intact Psychiatry: Judgement and insight appear normal. Mood & affect appropriate.     Data Reviewed: I have personally reviewed following labs and imaging studies  CBC: Recent Labs  Lab 04/01/20 0719 04/02/20 0314 04/03/20 0643  WBC 6.7 12.6* 11.5*  NEUTROABS 3.6  --   --   HGB 10.1* 12.8 10.7*  HCT 31.4* 39.6 33.1*  MCV 94.9 92.5 94.3  PLT PLATELET CLUMPS NOTED ON SMEAR, UNABLE TO ESTIMATE PLATELET CLUMPS NOTED ON SMEAR, COUNT APPEARS DECREASED 124*   Basic Metabolic Panel: Recent Labs  Lab 04/01/20 0719 04/02/20 0314 04/03/20 0643  NA 140 139 140  K 3.4* 4.1 3.6  CL 110 105 104  CO2 24 28 28   GLUCOSE 104* 116* 123*  BUN 13 9 16   CREATININE 0.58 0.51 0.67  CALCIUM 8.1* 9.2 8.6*   GFR: Estimated Creatinine Clearance: 80.8 mL/min (by C-G formula based on SCr of 0.67 mg/dL). Liver Function Tests: Recent Labs  Lab 04/03/20 0643  AST 30  ALT 20  ALKPHOS 42  BILITOT 0.7  PROT 6.4*  ALBUMIN 3.1*   No results for input(s): LIPASE, AMYLASE in  the last 168 hours. No results for input(s): AMMONIA in the last 168 hours. Coagulation Profile: No results for input(s): INR, PROTIME in the last 168 hours. Cardiac Enzymes: No results for input(s): CKTOTAL, CKMB, CKMBINDEX, TROPONINI in the last 168 hours. BNP (last 3 results) No results for input(s): PROBNP in the last 8760 hours. HbA1C: No results for input(s): HGBA1C in the last 72 hours. CBG: Recent Labs  Lab 04/03/20 0757 04/03/20 1146 04/03/20 1656 04/03/20 2119 04/04/20 0725  GLUCAP 112* 105* 120* 102* 96   Lipid Profile: No results for input(s): CHOL, HDL, LDLCALC, TRIG, CHOLHDL, LDLDIRECT in the last 72 hours. Thyroid Function  Tests: No results for input(s): TSH, T4TOTAL, FREET4, T3FREE, THYROIDAB in the last 72 hours. Anemia Panel: No results for input(s): VITAMINB12, FOLATE, FERRITIN, TIBC, IRON, RETICCTPCT in the last 72 hours. Sepsis Labs: No results for input(s): PROCALCITON, LATICACIDVEN in the last 168 hours.  Recent Results (from the past 240 hour(s))  Resp Panel by RT-PCR (Flu A&B, Covid) Nasopharyngeal Swab     Status: None   Collection Time: 04/01/20  8:32 AM   Specimen: Nasopharyngeal Swab; Nasopharyngeal(NP) swabs in vial transport medium  Result Value Ref Range Status   SARS Coronavirus 2 by RT PCR NEGATIVE NEGATIVE Final    Comment: (NOTE) SARS-CoV-2 target nucleic acids are NOT DETECTED.  The SARS-CoV-2 RNA is generally detectable in upper respiratory specimens during the acute phase of infection. The lowest concentration of SARS-CoV-2 viral copies this assay can detect is 138 copies/mL. A negative result does not preclude SARS-Cov-2 infection and should not be used as the sole basis for treatment or other patient management decisions. A negative result may occur with  improper specimen collection/handling, submission of specimen other than nasopharyngeal swab, presence of viral mutation(s) within the areas targeted by this assay, and inadequate number of viral copies(<138 copies/mL). A negative result must be combined with clinical observations, patient history, and epidemiological information. The expected result is Negative.  Fact Sheet for Patients:  BloggerCourse.com  Fact Sheet for Healthcare Providers:  SeriousBroker.it  This test is no t yet approved or cleared by the [place] FDA and  has been authorized for detection and/or diagnosis of SARS-CoV-2 by FDA under an Emergency Use Authorization (EUA). This EUA will remain  in effect (meaning this test can be used) for the duration of the COVID-19 declaration under Section  564(b)(1) of the Act, 21 U.S.C.section 360bbb-3(b)(1), unless the authorization is terminated  or revoked sooner.       Influenza A by PCR NEGATIVE NEGATIVE Final   Influenza B by PCR NEGATIVE NEGATIVE Final    Comment: (NOTE) The Xpert Xpress SARS-CoV-2/FLU/RSV plus assay is intended as an aid in the diagnosis of influenza from Nasopharyngeal swab specimens and should not be used as a sole basis for treatment. Nasal washings and aspirates are unacceptable for Xpert Xpress SARS-CoV-2/FLU/RSV testing.  Fact Sheet for Patients: BloggerCourse.com  Fact Sheet for Healthcare Providers: SeriousBroker.it  This test is not yet approved or cleared by the [place] FDA and has been authorized for detection and/or diagnosis of SARS-CoV-2 by FDA under an Emergency Use Authorization (EUA). This EUA will remain in effect (meaning this test can be used) for the duration of the COVID-19 declaration under Section 564(b)(1) of the Act, 21 U.S.C. section 360bbb-3(b)(1), unless the authorization is terminated or revoked.  Performed at [facility], [address], [place], [place] [address]   Surgical PCR screen     Status: Abnormal  Collection Time: 04/02/20  4:00 AM   Specimen: Nasal Mucosa; Nasal Swab  Result Value Ref Range Status   MRSA, PCR POSITIVE (A) NEGATIVE Final    Comment: ANDERSON C. 03.29.22 @ 0555 BY MECIAL J. RESULT CALLED TO, READ BACK BY AND VERIFIED WITH:    Staphylococcus aureus POSITIVE (A) NEGATIVE Final    Comment: RESULT CALLED TO, READ BACK BY AND VERIFIED WITH: ANDERSON C. 03.29.22 @ 0555 BY MECIAL J. (NOTE) The Xpert SA Assay (FDA approved for NASAL specimens in patients 64 years of age and older), is one component of a comprehensive surveillance program. It is not intended to diagnose infection nor to guide or monitor treatment. Performed at [facility], [address] [place], [place] [address]          Radiology Studies: Pelvis Portable  Result Date: 04/02/2020 CLINICAL DATA:  Postop. EXAM: PORTABLE PELVIS 1-2 VIEWS COMPARISON:  Preoperative radiograph yesterday. FINDINGS: Intramedullary nail with trans trochanteric screw fixation of subtrochanteric femur fracture, in improved alignment from preoperative imaging. The distal aspect of the femoral nail is not included in the field of view. Recent postsurgical change includes air and edema in the lateral soft tissues. IMPRESSION: Intramedullary nail and trans trochanteric screw fixation of subtrochanteric femur fracture, in improved alignment from preoperative imaging. Distal aspect of the intramedullary nail is not included in this pelvis field of view. Electronically Signed   By: Narda Rutherford M.D.   On: 04/02/2020 16:29   DG C-Arm 1-60 Min-No Report  Result Date: 04/02/2020 CLINICAL DATA:  Fracture, left femur IM nail. EXAM: LEFT FEMUR 2 VIEWS; DG C-ARM 1-60 MIN-NO REPORT COMPARISON:  Preoperative hip radiograph yesterday. FINDINGS: Four fluoroscopic spot views of the left femur obtained. Intramedullary nail with distal locking and trans trochanteric screw fixation of proximal femur fracture. Fracture is in improved alignment from preoperative imaging. Total fluoroscopy time 1 minutes 8 seconds. IMPRESSION: Intraoperative fluoroscopy during left femur fracture ORIF. Electronically Signed   By: Narda Rutherford M.D.   On: 04/02/2020 15:26   DG FEMUR MIN 2 VIEWS LEFT  Result Date: 04/02/2020 CLINICAL DATA:  Fracture, left femur IM nail. EXAM: LEFT FEMUR 2 VIEWS; DG C-ARM 1-60 MIN-NO REPORT COMPARISON:  Preoperative hip radiograph yesterday. FINDINGS: Four fluoroscopic spot views of the left femur obtained. Intramedullary nail with distal locking and trans trochanteric screw fixation of proximal femur fracture. Fracture is in improved alignment from preoperative imaging. Total fluoroscopy time 1  minutes 8 seconds. IMPRESSION: Intraoperative fluoroscopy during left femur fracture ORIF. Electronically Signed   By: Narda Rutherford M.D.   On: 04/02/2020 15:26        Scheduled Meds: . (feeding supplement) PROSource Plus  30 mL Oral Daily  . aspirin EC  325 mg Oral BID  . calcium-vitamin D  1 tablet Oral TID  . Chlorhexidine Gluconate Cloth  6 each Topical Daily  . colchicine  0.6 mg Oral BID  . docusate sodium  100 mg Oral BID  . DULoxetine  60 mg Oral Daily  . feeding supplement  237 mL Oral BID BM  . folic acid  2 mg Oral Daily  . insulin aspart  0-9 Units Subcutaneous TID WC  . leflunomide  20 mg Oral Daily  . lipase/protease/amylase  120,000 Units Oral TID with meals  . magnesium oxide  400 mg Oral TID  . mupirocin ointment  1 application Nasal BID  . pantoprazole  40 mg Oral Daily  . potassium chloride SA  20  mEq Oral TID  . [START ON 04/05/2020] predniSONE  30 mg Oral Daily  . propranolol  80 mg Oral BID  . rivaroxaban  10 mg Oral Daily  . traMADol  50 mg Oral Q6H   Continuous Infusions: . methocarbamol (ROBAXIN) IV    . methocarbamol (ROBAXIN) IV       LOS: 3 days    Time spent: 36 minutes spent on chart review, discussion with nursing staff, consultants, updating family and interview/physical exam; more than 50% of that time was spent in counseling and/or coordination of care.    Alvira Philips [place], DO [facility] Available via Epic secure chat 7am-7pm After these hours, please refer to coverage provider listed on [email or web address] 04/04/2020, 10:28 AM

## 2020-04-04 NOTE — TOC Transition Note (Signed)
Transition of Care [facility]) - CM/SW Discharge Note   Patient Details  Name: [name] MRN: [id number] Date of Birth: [birth year]  Transition of Care [facility]) CM/SW Contact:  Amada Jupiter, LCSW Phone Number: 04/04/2020, 11:37 AM   Clinical Narrative:    Pt anticipating dc today.  Have ordered DME and HH (see below).  No further TOC needs.   Final next level of care: Home w Home Health Services Barriers to Discharge: Barriers Resolved   Patient Goals and CMS Choice Patient states their goals for this hospitalization and ongoing recovery are:: return home      Discharge Placement                       Discharge Plan and Services     Post Acute Care Choice: Home Health          DME Arranged: 3-N-1,Walker rolling DME Agency: [employer] Date DME Agency Contacted: 04/03/20   Representative spoke with at DME Agency: Alex/ Harrold Donath HH Arranged: PT,OT HH Agency: [facility] Date [facility] Agency Contacted: 04/04/20 Time HH Agency Contacted: 1000 Representative spoke with at [facility] Agency: Kandee Keen  Social Determinants of Health (SDOH) Interventions     Readmission Risk Interventions No flowsheet data found.

## 2020-04-04 NOTE — Plan of Care (Signed)
  Problem: Education: Goal: Knowledge of General Education information will improve Description: Including pain rating scale, medication(s)/side effects and non-pharmacologic comfort measures Outcome: Progressing   Problem: Clinical Measurements: Goal: Ability to maintain clinical measurements within normal limits will improve Outcome: Progressing   Problem: Clinical Measurements: Goal: Cardiovascular complication will be avoided Outcome: Progressing   Problem: Activity: Goal: Risk for activity intolerance will decrease Outcome: Progressing   

## 2020-04-04 NOTE — Progress Notes (Signed)
Physical Therapy Treatment Patient Details Name: [name] MRN: [id number] DOB: 04-Oct-1960 Today's Date: 04/04/2020    History of Present Illness 60 year old female  with a past medical history of Behcet's syndrome on chronic prednisone and high-dose aspirin, POTS, pancreatic insufficiency, RA , adrenal insufficiency (secondary to chronic prednisone use), IBS, MI, sickle cell trait, CVA, mononeuritis multiplex, who presented to the ED 3/29/29022 after walking to let dog out, she suddenly stopped  and  felt a pop in her left leg and fell to the ground.  Xray-There is an oblique, displaced, angulated, and foreshortened fracture of the most proximal left femoral diaphysis. S/P IM nail 04/02/2020.    PT Comments    The patient demonstrated safe technique for  getting in /out of 32" high bed using step stool. Stepped up to 1 step for entry into home with min guard.  Patient   Reports 'Pop" in medial/lateral thigh intermitently. Reports inner thigh spasm persist, also.  Patient tolerated all activites, ambulated 90'.  Patient hopeful to DC home  Today. DME delivered.  Follow Up Recommendations  Home health PT     Equipment Recommendations  None recommended by PT    Recommendations for Other Services       Precautions / Restrictions Precautions Precautions: Fall Restrictions Weight Bearing Restrictions: No LLE Weight Bearing: Weight bearing as tolerated    Mobility  Bed Mobility Overal bed mobility: Modified Independent             General bed mobility comments: patient ising belt to lift LLE on tobed/off bed.    Transfers Overall transfer level: Needs assistance Equipment used: Rolling walker (2 wheeled)   Sit to Stand: Supervision         General transfer comment: patient demonstrates proper hand and left leg position  Ambulation/Gait Ambulation/Gait assistance: Min guard Gait Distance (Feet): 90 Feet (then 30') Assistive device: Rolling walker (2  wheeled) Gait Pattern/deviations: Step-to pattern;Step-through pattern;Antalgic Gait velocity: decr   General Gait Details: No noted spasms during ambulation, Noted by yell out when on toilet   Stairs Stairs: Yes Stairs assistance: Min guard Stair Management: No rails;Forwards;With walker Number of Stairs: 1 General stair comments: able to step up forward onto 1 step for entry into home.   Able to step up backward onto 7'' step to get up onto bed . Able to side step down with right leg when coming off of bed meqasuring 32".   Wheelchair Mobility    Modified Rankin (Stroke Patients Only)       Balance   Sitting-balance support: No upper extremity supported;Feet supported Sitting balance-Leahy Scale: Good     Standing balance support: During functional activity;Bilateral upper extremity supported Standing balance-Leahy Scale: Good Standing balance comment: able to stand statically without UE support                            Cognition Arousal/Alertness: Awake/alert                                            Exercises      General Comments        Pertinent Vitals/Pain Faces Pain Scale: Hurts even more Pain Location: when  thigh spasms and pops, otherwise< 4 Pain Descriptors / Indicators: Spasm;Sharp Pain Intervention(s): Monitored during session;Premedicated before session;Repositioned;Ice applied  Home Living                      Prior Function            PT Goals (current goals can now be found in the care plan section) Progress towards PT goals: Progressing toward goals    Frequency    Min 6X/week      PT Plan Current plan remains appropriate    Co-evaluation              AM-PAC PT "6 Clicks" Mobility   Outcome Measure  Help needed turning from your back to your side while in a flat bed without using bedrails?: A Little Help needed moving from lying on your back to sitting on the side of a flat  bed without using bedrails?: A Little Help needed moving to and from a bed to a chair (including a wheelchair)?: A Little Help needed standing up from a chair using your arms (e.g., wheelchair or bedside chair)?: A Little Help needed to walk in hospital room?: A Little Help needed climbing 3-5 steps with a railing? : A Little 6 Click Score: 18    End of Session Equipment Utilized During Treatment: Gait belt Activity Tolerance: Patient tolerated treatment well Patient left: in bed;with call bell/phone within reach;with bed alarm set;with family/visitor present Nurse Communication: Mobility status PT Visit Diagnosis: Muscle weakness (generalized) (M62.81);Difficulty in walking, not elsewhere classified (R26.2);Pain Pain - Right/Left: Left     Time: 1638-4536 PT Time Calculation (min) (ACUTE ONLY): 46 min  Charges:  $Gait Training: 8-22 mins $Self Care/Home Management: 8-22                     Blanchard Kelch PT Acute Rehabilitation Services Pager [phone] Office [phone]    Rada Hay 04/04/2020, 10:39 AM

## 2020-04-04 NOTE — Progress Notes (Signed)
Subjective: Patient reports pain as mild to moderate. Feeling much better and says pain is better controlled now. Still having muscle spasms but those have also decreased. Does endorse a painful popping sensation in the mid-anterior aspect of her left thigh which is where she said her thigh pain was located for several weeks prior to her fall. 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  Tolerating diet. Having diarrhea today so fluid output is higher than intake right now. Urinating. No CP, SOB. Has worked with PT/OT on mobilizing [name] and is doing much better.   Objective:   VITALS:   Vitals:   04/03/20 1344 04/03/20 1755 04/03/20 2117 04/04/20 0446  BP: 128/77 124/76 (!) 112/56 121/67  Pulse: 76 74 70 72  Resp: 18 16 18 15   Temp: 98.1 F (36.7 C) 98.4 F (36.9 C) 98.1 F (36.7 C) 98.1 F (36.7 C)  TempSrc:   Oral Oral  SpO2: 99% 100% 94% 96%  Weight:      Height:       CBC Latest Ref Rng & Units 04/03/2020 04/02/2020 04/01/2020  WBC 4.0 - 10.5 K/uL 11.5(H) 12.6(H) 6.7  Hemoglobin 12.0 - 15.0 g/dL 10.7(L) 12.8 10.1(L)  Hematocrit 36.0 - 46.0 % 33.1(L) 39.6 31.4(L)  Platelets 150 - 400 K/uL 124(L) PLATELET CLUMPS NOTED ON SMEAR, COUNT APPEARS DECREASED PLATELET CLUMPS NOTED ON SMEAR, UNABLE TO ESTIMATE   BMP Latest Ref Rng & Units 04/03/2020 04/02/2020 04/01/2020  Glucose 70 - 99 mg/dL 04/03/2020) 258(N) 277(O)  BUN 6 - 20 mg/dL 16 9 13   Creatinine 0.44 - 1.00 mg/dL 242(P 5.36  Sodium 135 - 145 mmol/L 140 139 140  Potassium 3.5 - 5.1 mmol/L 3.6 4.1 3.4(L)  Chloride 98 - 111 mmol/L 104 105 110  CO2 22 - 32 mmol/L 28 28 24   Calcium 8.9 - 10.3 mg/dL 1.44) 9.2 8.1(L)   Intake/Output      03/30 0701 03/31 0700 03/31 0701 04/01 0700   P.O. 680    I.V. (mL/kg)     IV Piggyback     Total Intake(mL/kg) 680 (8.6)    Urine (mL/kg/hr) 1350 (0.7) 800 (3.7)   Stool  0   Blood     Total Output 1350  800   Net -670 -800        Urine Occurrence  1 x   Stool Occurrence  2 x      Physical Exam: General: NAD. Laying in bed. Calm, comfortable, conversant. Resp: No increased wob Cardio: regular rate and rhythm ABD soft Neurologically intact MSK Neurovascularly intact Sensation intact distally Intact pulses distally Dorsiflexion/Plantar flexion intact Incision: dressing C/D/I   Assessment: 2 Days Post-Op  S/P Procedure(s) (LRB): INTRAMEDULLARY (IM) NAIL FEMORAL (Left) by Dr. 4/31. 4/31 on 04/02/20  Principal Problem:   Femur fracture (HCC) Active Problems:   Behcet's disease (HCC)   IBS (irritable bowel syndrome)   Iatrogenic adrenal insufficiency (HCC)   Pancreatic insufficiency   Plan: Steroid being tapered off via internal medicine team Up with therapy Incentive Spirometry Elevate and Apply ice  Weightbearing: WBAT LLE Insicional and dressing care: Dressings left intact until follow-up and Reinforce dressings as needed Orthopedic device(s): None Showering: POD 3 but keep dressing dry VTE prophylaxis: Aspirin 325mg  BID already taking as a home medicine. Will add Xarelto x 30 days as well, SCDs, ambulation Pain control: Continue current regimen Follow -  up plan: 2 weeks in the office with Dr. Adora Fridge information:  Lynn Rana MD, Lynn Fresh PA-C  Dispo: PT/OT evals state patient could d/c home but with HHPT. Will place orders for that. From an orthopedic standpoint she is stable for d/c.     Lynn Pane, PA-C Office [phone] 04/04/2020, 9:44 AM

## 2020-04-04 NOTE — Discharge Instructions (Addendum)
Diet: As you were doing prior to hospitalization   Shower:  May shower on POD 3 but keep the dressings and wounds dry. Can use an occlusive plastic wrap with tape over bandages. NO SOAKING IN TUB.  If the bandage gets wet, call our office to have it changed [phone]  Dressing:  Leave in place and we will change your bandages during your first follow-up appointment.    Activity:  Increase activity slowly as tolerated, but follow the weight bearing instructions below.  The rules on driving is that you can not be taking narcotics while you drive, and you must feel in control of the vehicle.    Weight Bearing:   Weight bearing as tolerated. Can use assistive devices to help with mobilization as needed  DVT prophylaxis: Continue to take the ASA 325mg  twice a day like you were doing before admission. Also take Xarelto 10mg  once a day. Both of these will help prevent blood clots after surgery   To prevent constipation: you may use a stool softener such as -  Colace (over the counter) 100 mg by mouth twice a day  Drink plenty of fluids (prune juice may be helpful) and high fiber foods Miralax (over the counter) for constipation as needed.    Itching:  If you experience itching with your medications, try taking only a single pain pill, or even half a pain pill at a time.  You may take up to 10 pain pills per day, and you can also use benadryl over the counter for itching or also to help with sleep.   Precautions:  If you experience chest pain or shortness of breath - call 911 immediately for transfer to the hospital emergency department!!  If you develop a fever greater that 101 F, purulent drainage from wound, increased redness or drainage from wound, or calf pain -- Call the office at [phone]                                                 Follow- Up Appointment:  Please call for an appointment to be seen in our office in 2 weeks by Dr. Eulah Pont - [phone]    Hip Fracture Treated  With ORIF, Care After This sheet gives you information about how to care for yourself after your procedure. Your health care provider may also give you more specific instructions. If you have problems or questions, contact your health care provider. What can I expect after the procedure? After the procedure, it is common to have:  Pain. You will be given medicines to treat this.  Swelling.  Difficulty walking.  Some redness or bruising around the incision.  A small amount of fluid or blood from the incision. Follow these instructions at home: Medicines  Take over-the-counter and prescription medicines only as told by your health care provider.  You may be given a blood thinner to take for up to six weeks. This will help reduce the risk of developing a blood clot. It is important to use this medicine exactly as directed.  You may be given calcium and vitamin D supplements to strengthen your bones.  If you are taking prescription pain medicine, take actions to prevent or treat constipation. Your health care provider may recommend that you: ? Drink enough fluid to keep your urine pale yellow. ? Eat foods that are high  in fiber, such as fresh fruits and vegetables, whole grains, and beans. ? Limit foods that are high in fat and processed sugars, such as fried or sweet foods. ? Take an over-the-counter or prescription medicine for constipation. Bathing  Do not take baths, swim, or use a hot tub until your health care provider approves. Ask your health care provider if you can take showers. You may only be allowed to take sponge baths.  Keep the bandage (dressing) dry until your health care provider says it can be removed. Incision care  Follow instructions from your health care provider about how to take care of your incision. Make sure you: ? Wash your hands with soap and water before you change your dressing. If soap and water are not available, use hand sanitizer. ? Change your  dressing as told by your health care provider. ? Leave stitches (sutures), skin glue, or adhesive strips in place. These skin closures may need to stay in place for 2 weeks or longer. If adhesive strip edges start to loosen and curl up, you may trim the loose edges. Do not remove adhesive strips completely unless your health care provider tells you to do that.  Check your incision area every day for signs of infection. Check for: ? More redness, swelling, or pain. ? More fluid or blood. ? Warmth. ? Pus or a bad smell.   Managing pain, stiffness, and swelling  If directed, put ice on the affected area to prevent pain and swelling. ? Put ice in a plastic bag. ? Place a towel between your skin and the bag. ? Leave the ice on for 20 minutes, 2-3 times a day.  Move your toes often to avoid stiffness and to lessen swelling.  Raise (elevate) your leg above the level of your heart while you are sitting or lying down. To do this, try putting a few pillows under your leg.   Activity  Return to your normal activities as told by your health care provider. Ask your health care provider what activities are safe for you.  Do exercises as told by your health care provider or physical therapist. This will help make your hip stronger and help you recover more quickly.  Do not use your injured limb to support (bear) your body weight until your health care provider says that you can. Follow weight-bearing restrictions as told. Use crutches or other devices to help you move around (assistive devices) as directed.  You may feel most comfortable using a raised surface when sitting on the toilet or in a chair.  Consider using a toilet seat riser over the toilet for comfort.   Driving  Do not drive or use heavy machinery while taking prescription pain medicine.  Ask your health care provider when it is safe for you to drive. General instructions  Wear compression stockings as told by your health care  provider. These stockings help to prevent blood clots and reduce swelling in your legs.  Do not use any products that contain nicotine or tobacco, such as cigarettes and e-cigarettes. These can delay bone healing. If you need help quitting, ask your health care provider.  Keep all follow-up visits as told by your health care provider. This is important. This may include visits for: ? Physical therapy. ? Screening for osteoporosis. Osteoporosis is thinning and loss of density in your bones. Contact a health care provider if you:  Have a fever.  Have pain that is not helped with medicine.  Have more  redness, swelling, or pain at your incision area.  Have more fluid or blood coming from your incision or leaking through your dressing.  Notice that your incision feels warm to the touch.  Have pus or a bad smell coming from your incision area. Get help right away if you:  Notice that the edges of your incision have come apart after the sutures or staples have been removed.  Have pain, warmth, or tenderness in the back of your lower leg (calf).  Have tingling or numbness in your leg.  Have a pale and cold leg.  Have trouble breathing.  Have chest pain. Summary  After the procedure, it is common to have some pain and swelling.  Take pain medicines as directed by your health care provider. Icing may also help with pain control.  Contact your health care provider if you have signs of infection, severe pain, or more fluid or blood coming from your incision. This information is not intended to replace advice given to you by your health care provider. Make sure you discuss any questions you have with your health care provider. Document Revised: 09/11/2017 Document Reviewed: 02/01/2017 [employer] Patient Education  2021 [employer]     Information on my medicine - XARELTO (Rivaroxaban)    Why was Xarelto prescribed for you? Xarelto was prescribed for you to reduce the risk of  blood clots forming after orthopedic surgery. The medical term for these abnormal blood clots is venous thromboembolism (VTE).  What do you need to know about xarelto ? Take your Xarelto ONCE DAILY at the same time every day. You may take it either with or without food.  If you have difficulty swallowing the tablet whole, you may crush it and mix in applesauce just prior to taking your dose.  Take Xarelto exactly as prescribed by your doctor and DO NOT stop taking Xarelto without talking to the doctor who prescribed the medication.  Stopping without other VTE prevention medication to take the place of Xarelto may increase your risk of developing a clot.  After discharge, you should have regular check-up appointments with your healthcare provider that is prescribing your Xarelto.    What do you do if you miss a dose? If you miss a dose, take it as soon as you remember on the same day then continue your regularly scheduled once daily regimen the next day. Do not take two doses of Xarelto on the same day.   Important Safety Information A possible side effect of Xarelto is bleeding. You should call your healthcare provider right away if you experience any of the following: ? Bleeding from an injury or your nose that does not stop. ? Unusual colored urine (red or dark brown) or unusual colored stools (red or black). ? Unusual bruising for unknown reasons. ? A serious fall or if you hit your head (even if there is no bleeding).  Some medicines may interact with Xarelto and might increase your risk of bleeding while on Xarelto. To help avoid this, consult your healthcare provider or pharmacist prior to using any new prescription or non-prescription medications, including herbals, vitamins, non-steroidal anti-inflammatory drugs (NSAIDs) and supplements.  This website has more information on Xarelto: VisitDestination.com.br.

## 2020-04-04 NOTE — Discharge Summary (Signed)
Physician Discharge Summary  [name] OXB:[id number] DOB: Apr 08, 1960 DOA: 04/01/2020  PCP: Gardner Candle, MD  Admit date: 04/01/2020 Discharge date: 04/04/2020  Admitted From: Home Disposition: Home  Recommendations for Outpatient Follow-up:  1. Follow up with PCP in 1-2 weeks 2. Follow-up with orthopedics, Dr. Eulah Pont in 2 weeks for wound check 3. Discharged on prednisone taper for adrenal insufficiency requiring perioperative stress dose steroids 4. Discharged aspirin and Xarelto for postoperative DVT prophylaxis per orthopedics  Home Health: PT/OT Equipment/Devices: Walker  Discharge Condition: Stable CODE STATUS: Full code Diet recommendation: Heart healthy diet  History of present illness:  [name] is a 60 year old female Jehovah's Witness (refuses whole blood products but okay with some derivatives)with a past medical history of Behcet's syndrome on chronic prednisoneand high-dose aspirin,POTS, pancreatic insufficiency,RA onHumiraand leflunomide, adrenal insufficiency(secondary to chronic prednisone use), IBS, MI secondary to immunosuppressant therapy, sickle cell trait, CVA, mononeuritis multiplex, who presented to the ED with several weeks of left hip/upper thigh pain which acutely worsened this morning. Patient went to let her dog outside and while walking she suddenly stopped. When she did this she felt a pop in her left leg and fell to the ground. No LOC, no head injury. Last took prednisone at 9 am yesterday.She has intolerance to morphine and fentanyl and therefore was given ketamine in route by EMS.Currently with complaints of muscle spasms in her leg. Admits to intact sensation of her left foot. No fever or chills or any other complaints.  In the ED, Afebrile, hemodynamically stable, on room air. Notable Labs:Sodium 140, K3.4, glucose 104, BUN 13, creatinine 0.58, calcium 8.1, WBC 6.7, Hb 10.1, COVID-19 pending. Notable  Imaging:Left hip XR-oblique, displaced, angulated and foreshortened fracture of proximal left femoral diaphysis concerning for pathologic fracture and widening of pubic symphysis concerning for additional injury. Patient receivedValium 5 mg, Dilaudid 0.5 mg x3, KCl , calcium carbonate p.o.orthopedics consulted.  Hospital service consulted for further evaluation and management of acute femur fracture.  Hospital course:  Left femur fracture s/p ORIF/IMN Patient presenting to the ED with left lower extremity pain.  X-ray notable for displaced proximal left femoral diaphysis fracture.  History of osteoporosis and some genetic bone disorders in her family.  Orthopedics was consulted and patient underwent ORIF with IM nail by Dr. Eulah Pont on 04/02/2020.  Patient continues with weightbearing as tolerated left lower extremity.  Discharging home with home health PT/OT and rolling walker.  Continue Tylenol as needed for mild pain and oxycodone as needed for moderate pain.  Robaxin as needed for muscle spasms.  DVT prophylaxis with aspirin 325 mg p.o. twice daily and Xarelto 10 mg p.o. daily x30 days.  Outpatient follow-up with orthopedics, Dr. Eulah Pont in 2 weeks.  Behcet's syndrome Follows with [facility] and [facility].  On prednisone 10 mg p.o. daily and aspirin 325 mg p.o. twice daily at home. Continue aspirin 325 mg p.o. twice daily.  Outpatient follow-up with PCP, endocrinology, rheumatology.  Adrenal insufficiency From chronic prednisone use.  On prednisone 10 mg p.o. daily at home.  Patient received perioperative stress dose steroids which are being tapered down following discharge.  Pancreatic insufficiency: Continue Creon  IBS: Hyoscyamine 0.125 mg sublingual every 6 hours as needed cramping  Rheumatoid arthritis: On Humira and leflunomide  Osteoporosis: Outpatient follow-up with endocrinology  History of POTS: Continue propranolol  Discharge Diagnoses:  Active  Problems:   Behcet's disease (HCC)   IBS (irritable bowel syndrome)   Iatrogenic adrenal insufficiency (HCC)  Pancreatic insufficiency    Discharge Instructions  Discharge Instructions    Call MD for:  difficulty breathing, headache or visual disturbances   Complete by: As directed    Call MD for:  extreme fatigue   Complete by: As directed    Call MD for:  persistant dizziness or light-headedness   Complete by: As directed    Call MD for:  persistant nausea and vomiting   Complete by: As directed    Call MD for:  severe uncontrolled pain   Complete by: As directed    Call MD for:  temperature >100.4   Complete by: As directed    Diet - low sodium heart healthy   Complete by: As directed    Increase activity slowly   Complete by: As directed    No wound care   Complete by: As directed        Follow-up Information    [facility] Follow up.   Specialty: Home Health Services Why: to provide home health physical and occupational therapy Contact information: [address] [place] [address] [phone]        Gardner Candle, MD. Schedule an appointment as soon as possible for a visit in 1 week(s).   Specialty: Internal Medicine Contact information: [address] [place] [address] [phone]        Thurmon Fair, MD .   Specialty: Cardiology Contact information: [address] [place] [address] [phone]        Sheral Apley, MD. Schedule an appointment as soon as possible for a visit in 2 week(s).   Specialty: Orthopedic Surgery Contact information: [address] [place] [address] [phone]              Allergies  Allergen Reactions  . Celebrex [Celecoxib] Other (See Comments)    Bleeding/bruising including bleeding for multiple days This is a reaction d/t sulfa product  . Fentanyl Nausea And Vomiting    Had mild heart attack after last  surgery along with vomiting severely. Does NOT want fentanyl at all.  Versed and valium are okay  . Latex Shortness Of Breath and Dermatitis  . Sulfa Drugs Cross Reactors Other (See Comments)    Eyes turn red, renal failure, stomach problems (vomiting, diarrhea), vasculature collapses, migraine like symptoms  . Sulfites Other (See Comments)    Eyes turn red, renal failure, stomach problems (vomiting, diarrhea), vasculature collapses, migraine like symptoms  . Simponi [Golimumab] Swelling    Simponi Aria- Headache, blurred vision. (similar to remicade, but uses human dna)  . Spiractazide [Hydrochlorothiazide W-Spironolactone] Other (See Comments)    Made blood pressure rise  . Adhesive [Tape] Other (See Comments)    Pulls skin right off.  PAPER TAPE is okay. tegaderm is also okay  . Codeine Itching and Nausea And Vomiting    Tolerates hydrocodone   . Epinephrine Other (See Comments)    Tachycardia (this is a normal side effect of this medication)  . Erythromycin Itching and Rash    Has tolerated azithromycin  . Loratadine Itching  . Morphine And Related Itching and Rash    Consultations:  Orthopedics, Dr. Eulah Pont   Procedures/Studies: DG Chest 1 View  Result Date: 04/01/2020 CLINICAL DATA:  Hip fracture EXAM: CHEST  1 VIEW COMPARISON:  02/15/2019 FINDINGS: Normal heart size and mediastinal contours. There is no edema, consolidation, effusion, or pneumothorax. No acute osseous finding. ACDF. IMPRESSION: No evidence  of active disease. Electronically Signed   By: Marnee Spring M.D.   On: 04/01/2020 08:03   Pelvis Portable  Result Date: 04/02/2020 CLINICAL DATA:  Postop. EXAM: PORTABLE PELVIS 1-2 VIEWS COMPARISON:  Preoperative radiograph yesterday. FINDINGS: Intramedullary nail with trans trochanteric screw fixation of subtrochanteric femur fracture, in improved alignment from preoperative imaging. The distal aspect of the femoral nail is not included in the field of view. Recent  postsurgical change includes air and edema in the lateral soft tissues. IMPRESSION: Intramedullary nail and trans trochanteric screw fixation of subtrochanteric femur fracture, in improved alignment from preoperative imaging. Distal aspect of the intramedullary nail is not included in this pelvis field of view. Electronically Signed   By: Narda Rutherford M.D.   On: 04/02/2020 16:29   DG C-Arm 1-60 Min-No Report  Result Date: 04/02/2020 CLINICAL DATA:  Fracture, left femur IM nail. EXAM: LEFT FEMUR 2 VIEWS; DG C-ARM 1-60 MIN-NO REPORT COMPARISON:  Preoperative hip radiograph yesterday. FINDINGS: Four fluoroscopic spot views of the left femur obtained. Intramedullary nail with distal locking and trans trochanteric screw fixation of proximal femur fracture. Fracture is in improved alignment from preoperative imaging. Total fluoroscopy time 1 minutes 8 seconds. IMPRESSION: Intraoperative fluoroscopy during left femur fracture ORIF. Electronically Signed   By: Narda Rutherford M.D.   On: 04/02/2020 15:26   DG Hip Unilat W or Wo Pelvis 2-3 Views Left  Result Date: 04/01/2020 CLINICAL DATA:  Fracture EXAM: DG HIP (WITH OR WITHOUT PELVIS) 2-3V LEFT COMPARISON:  None. FINDINGS: There is an oblique, displaced, angulated, and foreshortened fracture of the most proximal left femoral diaphysis. There is widening of the pubic symphysis. The partially imaged proximal right femur is intact. Postoperative findings of lower lumbar fusion and bilateral sacroiliac fusion. IMPRESSION: 1. There is an oblique, displaced, angulated, and foreshortened fracture of the most proximal left femoral diaphysis. In general, this location and reported low energy mechanism raise high suspicion for pathologic fracture; there is no obvious evidence of osseous lesion by radiographs. 2. There is widening of the pubic symphysis, concerning for additional injury. Electronically Signed   By: Lauralyn Primes M.D.   On: 04/01/2020 08:07   DG FEMUR MIN  2 VIEWS LEFT  Result Date: 04/02/2020 CLINICAL DATA:  Fracture, left femur IM nail. EXAM: LEFT FEMUR 2 VIEWS; DG C-ARM 1-60 MIN-NO REPORT COMPARISON:  Preoperative hip radiograph yesterday. FINDINGS: Four fluoroscopic spot views of the left femur obtained. Intramedullary nail with distal locking and trans trochanteric screw fixation of proximal femur fracture. Fracture is in improved alignment from preoperative imaging. Total fluoroscopy time 1 minutes 8 seconds. IMPRESSION: Intraoperative fluoroscopy during left femur fracture ORIF. Electronically Signed   By: Narda Rutherford M.D.   On: 04/02/2020 15:26      Subjective: Patient seen and examined at bedside, resting comfortably.  Son present.  Seen by orthopedics and okay for discharge home with outpatient follow-up.  No other complaints or concerns at this time.  Denies headache, no chest pain, no palpitations, no shortness of breath, no abdominal pain, no weakness, no fatigue, no paresthesias.  No acute events overnight per nurse staff.  Discharge Exam: Vitals:   04/03/20 2117 04/04/20 0446  BP: (!) 112/56 121/67  Pulse: 70 72  Resp: 18 15  Temp: 98.1 F (36.7 C) 98.1 F (36.7 C)  SpO2: 94% 96%   Vitals:   04/03/20 1344 04/03/20 1755 04/03/20 2117 04/04/20 0446  BP: 128/77 124/76 (!) 112/56 121/67  Pulse: 76 74 70 72  Resp: 18 16 18 15   Temp: 98.1 F (36.7 C) 98.4 F (36.9 C) 98.1 F (36.7 C) 98.1 F (36.7 C)  TempSrc:   Oral Oral  SpO2: 99% 100% 94% 96%  Weight:      Height:        General: Pt is alert, awake, not in acute distress Cardiovascular: RRR, S1/S2 +, no rubs, no gallops Respiratory: CTA bilaterally, no wheezing, no rhonchi, on room air Abdominal: Soft, NT, ND, bowel sounds + Extremities: no edema, no cyanosis, surgical dressing noted, clean/dry/intact    The results of significant diagnostics from this hospitalization (including imaging, microbiology, ancillary and laboratory) are listed below for  reference.     Microbiology: Recent Results (from the past 240 hour(s))  Resp Panel by RT-PCR (Flu A&B, Covid) Nasopharyngeal Swab     Status: None   Collection Time: 04/01/20  8:32 AM   Specimen: Nasopharyngeal Swab; Nasopharyngeal(NP) swabs in vial transport medium  Result Value Ref Range Status   SARS Coronavirus 2 by RT PCR NEGATIVE NEGATIVE Final    Comment: (NOTE) SARS-CoV-2 target nucleic acids are NOT DETECTED.  The SARS-CoV-2 RNA is generally detectable in upper respiratory specimens during the acute phase of infection. The lowest concentration of SARS-CoV-2 viral copies this assay can detect is 138 copies/mL. A negative result does not preclude SARS-Cov-2 infection and should not be used as the sole basis for treatment or other patient management decisions. A negative result may occur with  improper specimen collection/handling, submission of specimen other than nasopharyngeal swab, presence of viral mutation(s) within the areas targeted by this assay, and inadequate number of viral copies(<138 copies/mL). A negative result must be combined with clinical observations, patient history, and epidemiological information. The expected result is Negative.  Fact Sheet for Patients:  04/03/20  Fact Sheet for Healthcare Providers:  BloggerCourse.com  This test is no t yet approved or cleared by the [place] FDA and  has been authorized for detection and/or diagnosis of SARS-CoV-2 by FDA under an Emergency Use Authorization (EUA). This EUA will remain  in effect (meaning this test can be used) for the duration of the COVID-19 declaration under Section 564(b)(1) of the Act, 21 U.S.C.section 360bbb-3(b)(1), unless the authorization is terminated  or revoked sooner.       Influenza A by PCR NEGATIVE NEGATIVE Final   Influenza B by PCR NEGATIVE NEGATIVE Final    Comment: (NOTE) The Xpert Xpress SARS-CoV-2/FLU/RSV  plus assay is intended as an aid in the diagnosis of influenza from Nasopharyngeal swab specimens and should not be used as a sole basis for treatment. Nasal washings and aspirates are unacceptable for Xpert Xpress SARS-CoV-2/FLU/RSV testing.  Fact Sheet for Patients: [place]  Fact Sheet for Healthcare Providers: BloggerCourse.com  This test is not yet approved or cleared by the [place] FDA and has been authorized for detection and/or diagnosis of SARS-CoV-2 by FDA under an Emergency Use Authorization (EUA). This EUA will remain in effect (meaning this test can be used) for the duration of the COVID-19 declaration under Section 564(b)(1) of the Act, 21 U.S.C. section 360bbb-3(b)(1), unless the authorization is terminated or revoked.  Performed at [facility], [address] [place], [place]   Surgical PCR screen     Status: Abnormal   Collection Time: 04/02/20  4:00 AM   Specimen: Nasal Mucosa; Nasal Swab  Result Value Ref Range Status   MRSA, PCR POSITIVE (A) NEGATIVE Final    Comment: ANDERSON C. 03.29.22 @  0555 BY MECIAL J. RESULT CALLED TO, READ BACK BY AND VERIFIED WITH:    Staphylococcus aureus POSITIVE (A) NEGATIVE Final    Comment: RESULT CALLED TO, READ BACK BY AND VERIFIED WITH: ANDERSON C. 03.29.22 @ 0555 BY MECIAL J. (NOTE) The Xpert SA Assay (FDA approved for NASAL specimens in patients 80 years of age and older), is one component of a comprehensive surveillance program. It is not intended to diagnose infection nor to guide or monitor treatment. Performed at [facility], [address], [place], [place] [address]      Labs: BNP (last 3 results) No results for input(s): BNP in the last 8760 hours. Basic Metabolic Panel: Recent Labs  Lab 04/01/20 0719 04/02/20 0314 04/03/20 0643  NA 140 139 140  K 3.4* 4.1 3.6  CL 110 105 104  CO2 GLUCOSE 104* 116* 123*  BUN CREATININE 0.58 0.51 0.67  CALCIUM 8.1* 9.2 8.6*   Liver Function Tests: Recent Labs  Lab 04/03/20 0643  AST 30  ALT 20  ALKPHOS 42  BILITOT 0.7  PROT 6.4*  ALBUMIN 3.1*   No results for input(s): LIPASE, AMYLASE in the last 168 hours. No results for input(s): AMMONIA in the last 168 hours. CBC: Recent Labs  Lab 04/01/20 0719 04/02/20 0314 04/03/20 0643  WBC 6.7 12.6* 11.5*  NEUTROABS 3.6  --   --   HGB 10.1* 12.8 10.7*  HCT 31.4* 39.6 33.1*  MCV 94.9 92.5 94.3  PLT PLATELET CLUMPS NOTED ON SMEAR, UNABLE TO ESTIMATE PLATELET CLUMPS NOTED ON SMEAR, COUNT APPEARS DECREASED 124*   Cardiac Enzymes: No results for input(s): CKTOTAL, CKMB, CKMBINDEX, TROPONINI in the last 168 hours. BNP: Invalid input(s): POCBNP CBG: Recent Labs  Lab 04/03/20 1146 04/03/20 1656 04/03/20 2119 04/04/20 0725 04/04/20 1128  GLUCAP 105* 120* 102* 96 117*   D-Dimer No results for input(s): DDIMER in the last 72 hours. Hgb A1c No results for input(s): HGBA1C in the last 72 hours. Lipid Profile No results for input(s): CHOL, HDL, LDLCALC, TRIG, CHOLHDL, LDLDIRECT in the last 72 hours. Thyroid function studies No results for input(s): TSH, T4TOTAL, T3FREE, THYROIDAB in the last 72 hours.  Invalid input(s): FREET3 Anemia work up No results for input(s): VITAMINB12, FOLATE, FERRITIN, TIBC, IRON, RETICCTPCT in the last 72 hours. Urinalysis    Component Value Date/Time   COLORURINE YELLOW 06/15/2014 1145   APPEARANCEUR CLEAR 06/15/2014 1145   LABSPEC 1.015 01/29/2018 1553   PHURINE 7.5 01/29/2018 1553   GLUCOSEU NEGATIVE 01/29/2018 1553   HGBUR TRACE (A) 01/29/2018 1553   BILIRUBINUR NEGATIVE 01/29/2018 1553   KETONESUR NEGATIVE 01/29/2018 1553   PROTEINUR NEGATIVE 01/29/2018 1553   UROBILINOGEN 0.2 01/29/2018 1553   NITRITE NEGATIVE 01/29/2018 1553   LEUKOCYTESUR NEGATIVE 01/29/2018 1553   Sepsis Labs Invalid input(s): PROCALCITONIN,   WBC,  LACTICIDVEN Microbiology Recent Results (from the past 240 hour(s))  Resp Panel by RT-PCR (Flu A&B, Covid) Nasopharyngeal Swab     Status: None   Collection Time: 04/01/20  8:32 AM   Specimen: Nasopharyngeal Swab; Nasopharyngeal(NP) swabs in vial transport medium  Result Value Ref Range Status   SARS Coronavirus 2 by RT PCR NEGATIVE NEGATIVE Final    Comment: (NOTE) SARS-CoV-2 target nucleic acids are NOT DETECTED.  The SARS-CoV-2 RNA is generally detectable in upper respiratory specimens during the acute phase of infection. The lowest concentration of SARS-CoV-2 viral copies this assay can detect is 138 copies/mL. A negative  result does not preclude SARS-Cov-2 infection and should not be used as the sole basis for treatment or other patient management decisions. A negative result may occur with  improper specimen collection/handling, submission of specimen other than nasopharyngeal swab, presence of viral mutation(s) within the areas targeted by this assay, and inadequate number of viral copies(<138 copies/mL). A negative result must be combined with clinical observations, patient history, and epidemiological information. The expected result is Negative.  Fact Sheet for Patients:  BloggerCourse.com  Fact Sheet for Healthcare Providers:  [employer]ious[employer]ker.it  This test is no t yet approved or cleared by the [place] FDA and  has been authorized for detection and/or diagnosis of SARS-CoV-2 by FDA under an Emergency Use Authorization (EUA). This EUA will remain  in effect (meaning this test can be used) for the duration of the COVID-19 declaration under Section 564(b)(1) of the Act, 21 U.S.C.section 360bbb-3(b)(1), unless the authorization is terminated  or revoked sooner.       Influenza A by PCR NEGATIVE NEGATIVE Final   Influenza B by PCR NEGATIVE NEGATIVE Final    Comment: (NOTE) The Xpert Xpress SARS-CoV-2/FLU/RSV  plus assay is intended as an aid in the diagnosis of influenza from Nasopharyngeal swab specimens and should not be used as a sole basis for treatment. Nasal washings and aspirates are unacceptable for Xpert Xpress SARS-CoV-2/FLU/RSV testing.  Fact Sheet for Patients: BloggerCourse.com  Fact Sheet for Healthcare Providers: [employer]ious[employer]ker.it  This test is not yet approved or cleared by the [place] FDA and has been authorized for detection and/or diagnosis of SARS-CoV-2 by FDA under an Emergency Use Authorization (EUA). This EUA will remain in effect (meaning this test can be used) for the duration of the COVID-19 declaration under Section 564(b)(1) of the Act, 21 U.S.C. section 360bbb-3(b)(1), unless the authorization is terminated or revoked.  Performed at [facility], [address], [place], [place] [address]   Surgical PCR screen     Status: Abnormal   Collection Time: 04/02/20  4:00 AM   Specimen: Nasal Mucosa; Nasal Swab  Result Value Ref Range Status   MRSA, PCR POSITIVE (A) NEGATIVE Final    Comment: ANDERSON C. 03.29.22 @ 0555 BY MECIAL J. RESULT CALLED TO, READ BACK BY AND VERIFIED WITH:    Staphylococcus aureus POSITIVE (A) NEGATIVE Final    Comment: RESULT CALLED TO, READ BACK BY AND VERIFIED WITH: ANDERSON C. 03.29.22 @ 0555 BY MECIAL J. (NOTE) The Xpert SA Assay (FDA approved for NASAL specimens in patients 45 years of age and older), is one component of a comprehensive surveillance program. It is not intended to diagnose infection nor to guide or monitor treatment. Performed at [facility], [address], [place], [place] [address]      Time coordinating discharge: Over 30 minutes  SIGNED:   Alvira Philips [place], DO  [facility] 04/04/2020, 3:59 PM

## 2020-04-06 ENCOUNTER — Other Ambulatory Visit: Payer: Self-pay | Admitting: Cardiovascular Disease

## 2020-04-08 MED ORDER — PROPRANOLOL HCL 80 MG PO TABS
ORAL_TABLET | ORAL | 1 refills | Status: DC
  Filled 2020-04-08: qty 180, 90d supply, fill #0
  Filled 2020-07-09: qty 10, 5d supply, fill #1
  Filled 2020-07-10: qty 170, 85d supply, fill #1

## 2020-04-09 ENCOUNTER — Other Ambulatory Visit (HOSPITAL_COMMUNITY): Payer: Self-pay

## 2020-04-10 ENCOUNTER — Other Ambulatory Visit (HOSPITAL_COMMUNITY): Payer: Self-pay

## 2020-04-11 ENCOUNTER — Other Ambulatory Visit (HOSPITAL_COMMUNITY): Payer: Self-pay

## 2020-04-17 ENCOUNTER — Other Ambulatory Visit (HOSPITAL_COMMUNITY): Payer: Self-pay

## 2020-04-17 MED ORDER — OXYCODONE HCL 5 MG PO TABS
ORAL_TABLET | ORAL | 0 refills | Status: DC
  Filled 2020-04-17: qty 20, 5d supply, fill #0

## 2020-04-17 MED ORDER — METHOCARBAMOL 500 MG PO TABS
ORAL_TABLET | ORAL | 0 refills | Status: DC
  Filled 2020-04-17: qty 30, 10d supply, fill #0

## 2020-04-18 ENCOUNTER — Other Ambulatory Visit (HOSPITAL_COMMUNITY): Payer: Self-pay

## 2020-04-19 ENCOUNTER — Other Ambulatory Visit (HOSPITAL_COMMUNITY): Payer: Self-pay

## 2020-04-19 MED FILL — Leflunomide Tab 20 MG: ORAL | 90 days supply | Qty: 90 | Fill #0 | Status: AC

## 2020-04-19 MED FILL — Gabapentin Cap 100 MG: ORAL | 30 days supply | Qty: 90 | Fill #0 | Status: AC

## 2020-04-19 MED FILL — Lansoprazole Cap Delayed Release 30 MG: ORAL | 30 days supply | Qty: 30 | Fill #0 | Status: AC

## 2020-04-19 MED FILL — Duloxetine HCl Enteric Coated Pellets Cap 60 MG (Base Eq): ORAL | 90 days supply | Qty: 90 | Fill #0 | Status: AC

## 2020-04-22 ENCOUNTER — Other Ambulatory Visit (HOSPITAL_COMMUNITY): Payer: Self-pay

## 2020-04-23 ENCOUNTER — Other Ambulatory Visit (HOSPITAL_COMMUNITY): Payer: Self-pay

## 2020-04-23 MED FILL — Prednisone Tab 10 MG: ORAL | 90 days supply | Qty: 90 | Fill #0 | Status: AC

## 2020-05-07 ENCOUNTER — Other Ambulatory Visit (HOSPITAL_COMMUNITY): Payer: Self-pay

## 2020-05-07 MED ORDER — MUPIROCIN 2 % EX OINT
TOPICAL_OINTMENT | CUTANEOUS | 0 refills | Status: DC
  Filled 2020-05-07: qty 22, 10d supply, fill #0

## 2020-05-07 MED ORDER — FLUCONAZOLE 150 MG PO TABS
150.0000 mg | ORAL_TABLET | Freq: Once | ORAL | 0 refills | Status: AC
  Filled 2020-05-07: qty 1, 1d supply, fill #0

## 2020-05-13 ENCOUNTER — Other Ambulatory Visit (HOSPITAL_COMMUNITY): Payer: Self-pay

## 2020-05-14 ENCOUNTER — Other Ambulatory Visit (HOSPITAL_COMMUNITY): Payer: Self-pay

## 2020-05-14 MED FILL — Folic Acid Tab 1 MG: ORAL | 90 days supply | Qty: 180 | Fill #0 | Status: AC

## 2020-05-15 ENCOUNTER — Other Ambulatory Visit (HOSPITAL_COMMUNITY): Payer: Self-pay

## 2020-05-16 ENCOUNTER — Other Ambulatory Visit (HOSPITAL_COMMUNITY): Payer: Self-pay

## 2020-05-16 MED ORDER — FLUCONAZOLE 100 MG PO TABS
ORAL_TABLET | ORAL | 0 refills | Status: DC
  Filled 2020-05-16: qty 14, 14d supply, fill #0

## 2020-05-17 ENCOUNTER — Other Ambulatory Visit (HOSPITAL_COMMUNITY): Payer: Self-pay

## 2020-05-21 ENCOUNTER — Other Ambulatory Visit (HOSPITAL_COMMUNITY): Payer: Self-pay

## 2020-05-21 MED FILL — Lansoprazole Cap Delayed Release 30 MG: ORAL | 30 days supply | Qty: 30 | Fill #1 | Status: AC

## 2020-05-27 ENCOUNTER — Other Ambulatory Visit (HOSPITAL_COMMUNITY): Payer: Self-pay

## 2020-05-27 MED FILL — Gabapentin Cap 100 MG: ORAL | 30 days supply | Qty: 90 | Fill #1 | Status: AC

## 2020-05-28 ENCOUNTER — Ambulatory Visit: Payer: Medicare Other | Admitting: Obstetrics & Gynecology

## 2020-05-28 ENCOUNTER — Other Ambulatory Visit: Payer: Self-pay

## 2020-05-28 ENCOUNTER — Encounter: Payer: Self-pay | Admitting: Obstetrics & Gynecology

## 2020-05-28 ENCOUNTER — Other Ambulatory Visit (HOSPITAL_COMMUNITY)
Admission: RE | Admit: 2020-05-28 | Discharge: 2020-05-28 | Disposition: A | Discharge status: Home / Self Care | Payer: Medicare Other | Source: Ambulatory Visit | Attending: Obstetrics & Gynecology | Admitting: Obstetrics & Gynecology

## 2020-05-28 VITALS — BP 136/86 | Ht 64.0 in | Wt 169.0 lb

## 2020-05-28 DIAGNOSIS — Z1272 Encounter for screening for malignant neoplasm of vagina: Secondary | ICD-10-CM | POA: Diagnosis not present

## 2020-05-28 DIAGNOSIS — Z90722 Acquired absence of ovaries, bilateral: Secondary | ICD-10-CM

## 2020-05-28 DIAGNOSIS — Z01419 Encounter for gynecological examination (general) (routine) without abnormal findings: Secondary | ICD-10-CM | POA: Insufficient documentation

## 2020-05-28 DIAGNOSIS — Z90710 Acquired absence of both cervix and uterus: Secondary | ICD-10-CM | POA: Diagnosis not present

## 2020-05-28 DIAGNOSIS — M818 Other osteoporosis without current pathological fracture: Secondary | ICD-10-CM

## 2020-05-28 DIAGNOSIS — Z9079 Acquired absence of other genital organ(s): Secondary | ICD-10-CM

## 2020-05-28 NOTE — Progress Notes (Signed)
[name] [birth year] [id number]   History:    60 y.o. G3P1A2L1  RP:  New (>3 years) patient presenting for annual gyn exam   HPI: S/P TAH/BSO.  Brain tumor followed at [facility].  Immune disorders.  Osteoporosis on Forteo, followed by Endocrino.  No pelvic pain.  Complains of vaginal dryness.  Abstinent.  Breasts normal.  MRI of Breasts Neg 01/2020.  Colono 2015.  BMI 29.01.    Past medical history,surgical history, family history and social history were all reviewed and documented in the EPIC chart.  Gynecologic History No LMP recorded. Patient has had a hysterectomy.  Obstetric History OB History  Gravida Para Term Preterm AB Living  3 1 1   2 1   SAB IAB Ectopic Multiple Live Births               # Outcome Date GA Lbr Len/2nd Weight Sex Delivery Anes PTL Lv  3 AB           2 AB           1 Term              ROS: A ROS was performed and pertinent positives and negatives are included in the history.  GENERAL: No fevers or chills. HEENT: No change in vision, no earache, sore throat or sinus congestion. NECK: No pain or stiffness. CARDIOVASCULAR: No chest pain or pressure. No palpitations. PULMONARY: No shortness of breath, cough or wheeze. GASTROINTESTINAL: No abdominal pain, nausea, vomiting or diarrhea, melena or bright red blood per rectum. GENITOURINARY: No urinary frequency, urgency, hesitancy or dysuria. MUSCULOSKELETAL: No joint or muscle pain, no back pain, no recent trauma. DERMATOLOGIC: No rash, no itching, no lesions. ENDOCRINE: No polyuria, polydipsia, no heat or cold intolerance. No recent change in weight. HEMATOLOGICAL: No anemia or easy bruising or bleeding. NEUROLOGIC: No headache, seizures, numbness, tingling or weakness. PSYCHIATRIC: No depression, no loss of interest in normal activity or change in sleep pattern.     Exam:   BP 136/86   Ht 5\' 4"  (1.626 m)   Wt 169 lb (76.7 kg)   BMI 29.01 kg/m   Body mass index is 29.01 kg/m.  General  appearance : Well developed well nourished female. No acute distress HEENT: Eyes: no retinal hemorrhage or exudates,  Neck supple, trachea midline, no carotid bruits, no thyroidmegaly Lungs: Clear to auscultation, no rhonchi or wheezes, or rib retractions  Heart: Regular rate and rhythm, no murmurs or gallops Breast:Examined in sitting and supine position were symmetrical in appearance, no palpable masses or tenderness,  no skin retraction, no nipple inversion, no nipple discharge, no skin discoloration, no axillary or supraclavicular lymphadenopathy Abdomen: no palpable masses or tenderness, no rebound or guarding Extremities: no edema or skin discoloration or tenderness  Pelvic: Vulva: Normal             Vagina: No gross lesions or discharge.  Pap reflex done.  Cervix/Uterus absent  Adnexa  Without masses or tenderness  Anus: Normal   Assessment/Plan:  59 y.o. female for annual exam   1. Encounter for Papanicolaou smear of vagina as part of routine gynecological examination Gynecologic exam status post TAH/BSO. Recommend coconut oil for vaginal and vulvar dryness. Pap reflex done on the vaginal vault.  Breast exam normal.  MRI of the breast negative in January 2022.  Colonoscopy 2015.  Health labs with family physician.  Body mass index 29.01.  Healthy nutrition and fitness to continue.  2. S/P  TAH-BSO  3. Other osteoporosis without current pathological fracture Left femur fracture with a plate and a nail in 03/2020.  We will continue to manage with endocrinology.  Genia Del MD, 3:17 PM 05/28/2020

## 2020-05-29 DIAGNOSIS — Z0289 Encounter for other administrative examinations: Secondary | ICD-10-CM

## 2020-05-30 LAB — CYTOLOGY - PAP: Diagnosis: NEGATIVE

## 2020-06-03 ENCOUNTER — Encounter: Payer: Self-pay | Admitting: Obstetrics & Gynecology

## 2020-06-12 ENCOUNTER — Other Ambulatory Visit (HOSPITAL_COMMUNITY): Payer: Self-pay

## 2020-06-13 ENCOUNTER — Other Ambulatory Visit (HOSPITAL_COMMUNITY): Payer: Self-pay

## 2020-06-14 ENCOUNTER — Other Ambulatory Visit (HOSPITAL_COMMUNITY): Payer: Self-pay

## 2020-06-14 MED ORDER — POTASSIUM CHLORIDE CRYS ER 20 MEQ PO TBCR
20.0000 meq | EXTENDED_RELEASE_TABLET | Freq: Three times a day (TID) | ORAL | 3 refills | Status: DC
Start: 2020-06-14 — End: 2021-06-19
  Filled 2020-06-14: qty 270, 90d supply, fill #0
  Filled 2020-09-16: qty 270, 90d supply, fill #1
  Filled 2020-12-18: qty 270, 90d supply, fill #2
  Filled 2021-03-24: qty 270, 90d supply, fill #3

## 2020-06-18 ENCOUNTER — Other Ambulatory Visit (HOSPITAL_COMMUNITY): Payer: Self-pay

## 2020-06-24 ENCOUNTER — Other Ambulatory Visit (HOSPITAL_COMMUNITY): Payer: Self-pay

## 2020-06-24 MED FILL — Lansoprazole Cap Delayed Release 30 MG: ORAL | 30 days supply | Qty: 30 | Fill #2 | Status: AC

## 2020-07-01 ENCOUNTER — Other Ambulatory Visit (HOSPITAL_COMMUNITY): Payer: Self-pay

## 2020-07-01 MED FILL — Gabapentin Cap 100 MG: ORAL | 30 days supply | Qty: 90 | Fill #2 | Status: AC

## 2020-07-10 ENCOUNTER — Other Ambulatory Visit (HOSPITAL_COMMUNITY): Payer: Self-pay

## 2020-07-11 ENCOUNTER — Other Ambulatory Visit (HOSPITAL_COMMUNITY): Payer: Self-pay

## 2020-07-12 MED FILL — Prednisone Tab 10 MG: ORAL | 90 days supply | Qty: 90 | Fill #1 | Status: AC

## 2020-07-13 ENCOUNTER — Other Ambulatory Visit (HOSPITAL_COMMUNITY): Payer: Self-pay

## 2020-07-23 ENCOUNTER — Other Ambulatory Visit (HOSPITAL_COMMUNITY): Payer: Self-pay | Admitting: Gastroenterology

## 2020-07-23 ENCOUNTER — Other Ambulatory Visit (HOSPITAL_COMMUNITY): Payer: Self-pay

## 2020-07-23 MED FILL — Leflunomide Tab 20 MG: ORAL | 90 days supply | Qty: 90 | Fill #1 | Status: AC

## 2020-07-23 MED FILL — Duloxetine HCl Enteric Coated Pellets Cap 60 MG (Base Eq): ORAL | 90 days supply | Qty: 90 | Fill #1 | Status: AC

## 2020-07-24 ENCOUNTER — Other Ambulatory Visit (HOSPITAL_COMMUNITY): Payer: Self-pay

## 2020-07-24 MED ORDER — LANSOPRAZOLE 30 MG PO CPDR
DELAYED_RELEASE_CAPSULE | ORAL | 4 refills | Status: DC
  Filled 2020-07-24: qty 90, 90d supply, fill #0
  Filled 2020-10-23: qty 90, 90d supply, fill #1

## 2020-07-25 ENCOUNTER — Other Ambulatory Visit (HOSPITAL_COMMUNITY): Payer: Self-pay

## 2020-07-30 ENCOUNTER — Other Ambulatory Visit (HOSPITAL_COMMUNITY): Payer: Self-pay

## 2020-07-31 ENCOUNTER — Other Ambulatory Visit (HOSPITAL_COMMUNITY): Payer: Self-pay

## 2020-08-01 ENCOUNTER — Other Ambulatory Visit (HOSPITAL_COMMUNITY): Payer: Self-pay

## 2020-08-01 MED ORDER — GABAPENTIN 100 MG PO CAPS
100.0000 mg | ORAL_CAPSULE | Freq: Three times a day (TID) | ORAL | 5 refills | Status: DC
  Filled 2020-08-01: qty 90, 30d supply, fill #0
  Filled 2020-09-02: qty 90, 30d supply, fill #1
  Filled 2020-10-01: qty 90, 30d supply, fill #2
  Filled 2020-11-01: qty 90, 30d supply, fill #3
  Filled 2020-12-02: qty 90, 30d supply, fill #4
  Filled 2021-01-01: qty 90, 30d supply, fill #5

## 2020-08-02 ENCOUNTER — Other Ambulatory Visit (HOSPITAL_COMMUNITY): Payer: Self-pay

## 2020-08-08 ENCOUNTER — Other Ambulatory Visit (HOSPITAL_COMMUNITY): Payer: Self-pay

## 2020-08-08 MED FILL — Folic Acid Tab 1 MG: ORAL | 90 days supply | Qty: 180 | Fill #1 | Status: AC

## 2020-09-02 ENCOUNTER — Other Ambulatory Visit (HOSPITAL_COMMUNITY): Payer: Self-pay

## 2020-09-03 ENCOUNTER — Other Ambulatory Visit (HOSPITAL_COMMUNITY): Payer: Self-pay

## 2020-09-03 MED ORDER — AZELASTINE HCL 0.1 % NA SOLN
NASAL | 3 refills | Status: AC
  Filled 2020-09-03: qty 30, 25d supply, fill #0
  Filled 2020-10-23: qty 30, 25d supply, fill #1
  Filled 2021-02-20: qty 30, 25d supply, fill #2
  Filled 2021-07-05: qty 30, 25d supply, fill #3

## 2020-09-07 ENCOUNTER — Other Ambulatory Visit (HOSPITAL_COMMUNITY): Payer: Self-pay

## 2020-09-07 MED ORDER — MUPIROCIN 2 % EX OINT: TOPICAL_OINTMENT | CUTANEOUS | 0 refills | Status: DC

## 2020-09-10 ENCOUNTER — Other Ambulatory Visit (HOSPITAL_COMMUNITY): Payer: Self-pay

## 2020-09-11 ENCOUNTER — Other Ambulatory Visit (HOSPITAL_COMMUNITY): Payer: Self-pay

## 2020-09-17 ENCOUNTER — Other Ambulatory Visit (HOSPITAL_COMMUNITY): Payer: Self-pay

## 2020-09-25 ENCOUNTER — Other Ambulatory Visit (HOSPITAL_COMMUNITY): Payer: Self-pay

## 2020-09-25 MED FILL — Prednisone Tab 10 MG: ORAL | 90 days supply | Qty: 90 | Fill #2 | Status: AC

## 2020-10-02 ENCOUNTER — Other Ambulatory Visit (HOSPITAL_COMMUNITY): Payer: Self-pay

## 2020-10-07 ENCOUNTER — Other Ambulatory Visit: Payer: Self-pay | Admitting: Cardiovascular Disease

## 2020-10-08 ENCOUNTER — Other Ambulatory Visit (HOSPITAL_COMMUNITY): Payer: Self-pay

## 2020-10-08 MED ORDER — PROPRANOLOL HCL 80 MG PO TABS
ORAL_TABLET | ORAL | 1 refills | Status: DC
  Filled 2020-10-08: qty 180, 90d supply, fill #0

## 2020-10-09 ENCOUNTER — Other Ambulatory Visit (HOSPITAL_COMMUNITY): Payer: Self-pay

## 2020-10-10 ENCOUNTER — Other Ambulatory Visit (HOSPITAL_COMMUNITY): Payer: Self-pay

## 2020-10-18 ENCOUNTER — Other Ambulatory Visit (HOSPITAL_COMMUNITY): Payer: Self-pay

## 2020-10-18 MED ORDER — COLCHICINE 0.6 MG PO TABS
ORAL_TABLET | ORAL | 1 refills | Status: DC
  Filled 2020-10-18: qty 180, 90d supply, fill #0
  Filled 2021-02-19 – 2021-02-20 (×2): qty 180, 90d supply, fill #1

## 2020-10-23 ENCOUNTER — Other Ambulatory Visit (HOSPITAL_COMMUNITY): Payer: Self-pay

## 2020-10-23 MED FILL — Leflunomide Tab 20 MG: ORAL | 90 days supply | Qty: 90 | Fill #2 | Status: AC

## 2020-10-23 MED FILL — Duloxetine HCl Enteric Coated Pellets Cap 60 MG (Base Eq): ORAL | 90 days supply | Qty: 90 | Fill #2 | Status: AC

## 2020-10-24 ENCOUNTER — Other Ambulatory Visit (HOSPITAL_COMMUNITY): Payer: Self-pay

## 2020-11-01 ENCOUNTER — Other Ambulatory Visit (HOSPITAL_COMMUNITY): Payer: Self-pay

## 2020-11-04 ENCOUNTER — Other Ambulatory Visit (HOSPITAL_COMMUNITY): Payer: Self-pay

## 2020-11-08 ENCOUNTER — Other Ambulatory Visit (HOSPITAL_COMMUNITY): Payer: Self-pay

## 2020-11-08 MED FILL — Folic Acid Tab 1 MG: ORAL | 90 days supply | Qty: 180 | Fill #2 | Status: AC

## 2020-11-30 NOTE — Progress Notes (Deleted)
Cardiology Office Note:    Date:  11/30/2020   ID:  [name], DOB [birth year], MRN [id number]  PCP:  Evalee Mutton, MD  Cardiologist:  Sanda Klein, MD  Electrophysiologist:  None   Referring MD: Evalee Mutton, MD   Chief Complaint: follow-up of orthostatic hypotension and tachycardia  History of Present Illness:    [name] is a 60 y.o. female with a history of normal coronaries on cardiac catheterization in 2015, orthostatic tachycardia and hypotension at least in part related to adrenal insufficiency, palpitations, GERD, Behcet's syndrome on chronic steroids, IBS, chronic back pain, severe sacroiliac joint disease, and steroid induced osteoporosis who is followed by Dr. Sallyanne Kuster and presents today for routine follow-up of orthostatic hypotension and tachycardia.  Patient has been followed by Dr. Sallyanne Kuster since 2015 when she was  admitted for syncope and chest pain. Myoview during this admission showed a small area of mild reversibility of the anterior and inferior septal walls with hypokinesis of the mild to moderate septal, inferior, anterior, and lateral walls. Echo showed LVEF of 60-65% with no regional wall motion abnormalities. Patient ultimately underwent cardiac catheterization that admission which showed normal coronaries. Syncope was felt to be due to orthostatic hypotension related to adrenal insufficiency. Last Echo in 02/2019 showed LVEF of 60-65% with normal wall motion and diastolic function and no significant valvular disease.   Patient follows at [facility] for her Behcet's syndrome for which she is on chronic Prednisone. She was previously also on Fludrocortisone for transient adrenal suppression but is no longer on this. *** She is now actually taking HCTZ for problems with mild hypocalcemia and Spironolactone due to hypokalemia caused by HCTZ. She was also switched from Metoprolol to Propranolol due to a tremor.   Patient was last seen  by Dr. Sallyanne Kuster in 08/2019 at which time she was stable from a cardiac standpoint with no severe dizziness or syncope. Since her last visit, she suffered a fall in 03/2020 and fractured her left femur. She underwent an ORIF of the left femur which left her left leg shorter than her right. She recently had a spinal injection on 11/21/2020 for increasing back pain.  Patient presents today for follow-up. ***  Orthostatic Hypotension History of orthostatic hypotension with prior syncope. Largely felt to be due transient adrenal insufficiency. She follows with Endocrinology and is on chronic Prednisone. She was also previously on Fludrocortisone but is no longer on this. She has actually been on HCTZ to help with hypocalcemia and Spironolactone to help with hypokalemia caused by HCTZ and has been doing well on this. Issues with orthostatic hypotension seem to have resolved. *** - BP well controlled. - Continue Spironolactone-HCTZ 25-25mg  daily.  Palpitations Stable. - Continue Propranolol 80mg  twice daily. She takes this both for palpitations and tremor.  Behcet's Syndrome Followed by Rhuematology at [facility]   Past Medical History:  Diagnosis Date   Acid reflux    Adrenal insufficiency (HCC)    Anxiety    when she gets sick she gets anxious   Arrhythmia    Arthritis    inflammatory arthritis   Asthma    Behcet's syndrome (HCC)    Chronic back pain    Chronic neck pain    Colitis    Connective tissue disorder (HCC)    Depression    Dysrhythmia    POTS syndrome   Endometritis    Foot drop    due to muscle weakness from immunosuppressants   GERD (gastroesophageal reflux disease)  Gluten intolerance    Heart murmur    IBS (irritable bowel syndrome)    Myocardial infarction (HCC)    per pt, mild mi due to immunosuppressant therapy   Nasal ulcer    gets occasionally from medications   Osteoarthritis    Osteoporosis    T score reported -2.7   Pneumonia    20 years ago   PONV  (postoperative nausea and vomiting)    also low blood pressure at times. do not use lactated ringers. must have sugar with saline d/t adrenal insufficiency   Refusal of blood transfusions as patient is Jehovah's Witness    Renal cyst 2019   Sickle cell trait (HCC)    Stroke (HCC) 2014   small peripheral stroke. Mononeuritis multiplex   Subjective visual disturbance 07/14/2012   Superficial phlebitis and thrombophlebitis of both lower extremities 2019   Tear of lateral meniscus of right knee, current    Tear of lateral & medial meniscus of right knee   Tinnitus    left ear sees Dr Ezzard StandingNewman (ENT)   Uveitis    Visual changes 2019   left eye vision only with distance, right eye has floater. vascular changes    Past Surgical History:  Procedure Laterality Date   ANTERIOR CERVICAL DECOMP/DISCECTOMY FUSION N/A 08/22/2013   Procedure: ANTERIOR CERVICAL DECOMPRESSION/DISCECTOMY FUSION 2 LEVELS;  Surgeon: Karn CassisErnesto M Botero, MD;  Location: [facility];  Service: Neurosurgery;  Laterality: N/A;  C4-5 C5-6 Anterior cervical decompression/diskectomy/fusion   ANTERIOR FUSION CERVICAL SPINE  08/22/2013   c4 5  6          AXILLARY SURGERY     BACK SURGERY     BREAST EXCISIONAL BIOPSY Right 1986   BREAST SURGERY     Cysts excised   CARDIAC CATHETERIZATION  09/18/2013   CESAREAN SECTION  03/2001   x 1   CHOLECYSTECTOMY  12/2004   COLONOSCOPY  03/2013   DILATION AND CURETTAGE OF UTERUS     EXCISION OF BREAST LESION Right 08/18/2017   Procedure: EXCISION OF RIGHT AXILLARY SOFT TISSUE MASS;  Surgeon: Carolan Shiverintron-Diaz, Edgardo, MD;  Location: [facility] ORS;  Service: General;  Laterality: Right;   FEMUR IM NAIL Left 04/02/2020   Procedure: INTRAMEDULLARY (IM) NAIL FEMORAL;  Surgeon: Sheral ApleyMurphy, Timothy D, MD;  Location: [facility];  Service: Orthopedics;  Laterality: Left;   HAND SURGERY Right 07/2001   dupytren   HYSTEROSCOPY     IR SI JOINT INJ / ARTH LEFT W/IMAG GUIDE  2018   si fusion , left    IR SI JOINT INJ / ARTH  RIGHT W/IMAG GUIDE Right 2018   JOINT REPLACEMENT Right 11/2016   partial knee replacement   KNEE ARTHROSCOPY WITH LATERAL MENISECTOMY Right 12/12/2012   Procedure: KNEE ARTHROSCOPY WITH LATERAL MENISECTOMY;  Surgeon: Nilda Simmerobert A Wainer, MD;  Location: [facility];  Service: Orthopedics;  Laterality: Right;   KNEE SURGERY Left 05/1998   LEFT HEART CATHETERIZATION WITH CORONARY ANGIOGRAM N/A 09/18/2013   Procedure: LEFT HEART CATHETERIZATION WITH CORONARY ANGIOGRAM;  Surgeon: Micheline ChapmanMichael D Cooper, MD;  [facility];  Service: Cardiovascular;  Laterality: N/A;   LUMBAR DISC SURGERY     OOPHORECTOMY     BSO   PELVIC LAPAROSCOPY     DL   SPINE SURGERY     SUBMANDIBULAR MASS EXCISION     THYROID CYST EXCISION  1983   thyroglossal duct cyst   VAGINAL HYSTERECTOMY  07/2007   LAVH BSO  Current Medications: No outpatient medications have been marked as taking for the 12/03/20 encounter (Appointment) with Corrin Parker, PA-C.     Allergies:   Celebrex [celecoxib], Fentanyl, Latex, Sulfa drugs cross reactors, Sulfites, Simponi [golimumab], Spiractazide [hydrochlorothiazide w-spironolactone], Adhesive [tape], Codeine, Epinephrine, Erythromycin, Loratadine, and Morphine and related   Social History   Socioeconomic History   Marital status: Divorced    Spouse name: Not on file   Number of children: 1   Years of education: Not on file   Highest education level: Master's degree (e.g., MA, MS, MEng, MEd, MSW, MBA)  Occupational History   Occupation: disabled  Tobacco Use   Smoking status: Never   Smokeless tobacco: Never  Vaping Use   Vaping Use: Never used  Substance and Sexual Activity   Alcohol use: No    Alcohol/week: 0.0 standard drinks   Drug use: No   Sexual activity: Not Currently    Birth control/protection: Surgical    Comment: 1st intercourse 22 yo-5 partners  Other Topics Concern   Not on file  Social History Narrative   Not on file   Social  Determinants of Health   Financial Resource Strain: Not on file  Food Insecurity: Not on file  Transportation Needs: Not on file  Physical Activity: Not on file  Stress: Not on file  Social Connections: Not on file     Family History: The patient's family history includes Breast cancer in her mother and sister; CAD in her maternal grandfather; Depression in her mother; Diabetes in her mother and sister; Gastric cancer in her father; Hyperlipidemia in her mother and sister; Hypertension in her sister; Lupus in her cousin; Myasthenia gravis in her maternal aunt; Polymyositis in her maternal aunt; Prostate cancer in her paternal uncle; Rheum arthritis in her cousin; Skin cancer in her maternal aunt; Stroke in her mother; Thyroid disease in her sister.  ROS:   Please see the history of present illness.     EKGs/Labs/Other Studies Reviewed:    The following studies were reviewed today:  Myoview 09/17/2013: 1. Small area of mild reversibility involves the mid segment of the anterior and inferior lateral wall.  2. Mild to moderate septal and inferior wall hypokinesis. Mild anterior and lateral wall hypokinesis.  3. Left ventricular ejection fraction 44%. _______________  Cardiac Catheterization 09/18/2013: Final Conclusions:   1. Angiographically normal coronary arteries  2. Normal LV function   Recommendations: Suspect noncardiac chest pain _______________  Echocardiogram 02/16/2019: Impressions: 1. Left ventricular ejection fraction, by estimation, is 60 to 65%. The  left ventricle has normal function. The left ventrical has no regional  wall motion abnormalities. Left ventricular diastolic parameters were  normal.   2. Right ventricular systolic function is normal. The right ventricular  size is normal. There is normal pulmonary artery systolic pressure.   3. The mitral valve is grossly normal. no evidence of mitral valve  regurgitation. No evidence of mitral stenosis.   4. The  aortic valve is normal in structure and function. Aortic valve  regurgitation is trivial . No aortic stenosis is present.   EKG:  EKG ordered today. EKG personally reviewed and demonstrates ***.  Recent Labs: 04/03/2020: ALT 20; BUN 16; Creatinine, Ser 0.67; Hemoglobin 10.7; Platelets 124; Potassium 3.6; Sodium 140  Recent Lipid Panel    Component Value Date/Time   CHOL 175 09/18/2013 1030   TRIG 77 09/18/2013 1030   HDL 52 09/18/2013 1030   CHOLHDL 3.4 09/18/2013 1030   VLDL 15 09/18/2013 1030  LDLCALC 108 (H) 09/18/2013 1030    Physical Exam:    Vital Signs: There were no vitals taken for this visit.    Wt Readings from Last 3 Encounters:  05/28/20 169 lb (76.7 kg)  04/01/20 175 lb (79.4 kg)  08/17/19 202 lb 6.4 oz (91.8 kg)     General: 60 y.o. female in no acute distress. HEENT: Normocephalic and atraumatic. Sclera clear. EOMs intact. Neck: Supple. No carotid bruits. No JVD. Heart: *** RRR. Distinct S1 and S2. No murmurs, gallops, or rubs. Radial and distal pedal pulses 2+ and equal bilaterally. Lungs: No increased work of breathing. Clear to ausculation bilaterally. No wheezes, rhonchi, or rales.  Abdomen: Soft, non-distended, and non-tender to palpation. Bowel sounds present in all 4 quadrants.  MSK: Normal strength and tone for age. *** Extremities: No lower extremity edema.    Skin: Warm and dry. Neuro: Alert and oriented x3. No focal deficits. Psych: Normal affect. Responds appropriately.   Assessment:    No diagnosis found.  Plan:     Disposition: Follow up in ***   Medication Adjustments/Labs and Tests Ordered: Current medicines are reviewed at length with the patient today.  Concerns regarding medicines are outlined above.  No orders of the defined types were placed in this encounter.  No orders of the defined types were placed in this encounter.   There are no Patient Instructions on file for this visit.   Signed, Darreld Mclean, PA-C   11/30/2020 9:20 AM    [facility]

## 2020-12-02 ENCOUNTER — Other Ambulatory Visit (HOSPITAL_COMMUNITY): Payer: Self-pay

## 2020-12-02 MED FILL — Prednisone Tab 2.5 MG: ORAL | 30 days supply | Qty: 30 | Fill #0 | Status: AC

## 2020-12-02 MED FILL — Prednisone Tab 10 MG: ORAL | 90 days supply | Qty: 90 | Fill #3 | Status: AC

## 2020-12-02 NOTE — Progress Notes (Signed)
Cardiology Clinic Note   Patient Name: [name] Date of Encounter: 12/03/2020  Primary Care Provider:  Gardner Candle, MD Primary Cardiologist:  Thurmon Fair, MD  Patient Profile    60 year old female with a history of orthostatic hypotension, palpitations (on propanolol), OSA on CPAP, CVA, Behcet's disease (on chronic prednisone and high-dose aspirin, followed by Rheumatology), RA on Humira, IBS, colitis, adrenal insufficiency (secondary to chronic prednisone use), tremor, GERD, chronic back pain, and steroid induced osteoporosis (followed by endocrinology) who presents for annual follow-up related to palpitations and orthostatic hypotension.   Past Medical History    Past Medical History:  Diagnosis Date   Acid reflux    Adrenal insufficiency (HCC)    Anxiety    when she gets sick she gets anxious   Arrhythmia    Arthritis    inflammatory arthritis   Asthma    Behcet's syndrome (HCC)    Chronic back pain    Chronic neck pain    Colitis    Connective tissue disorder (HCC)    Depression    Dysrhythmia    POTS syndrome   Endometritis    Foot drop    due to muscle weakness from immunosuppressants   GERD (gastroesophageal reflux disease)    Gluten intolerance    Heart murmur    IBS (irritable bowel syndrome)    Myocardial infarction (HCC)    per pt, mild mi due to immunosuppressant therapy   Nasal ulcer    gets occasionally from medications   Osteoarthritis    Osteoporosis    T score reported -2.7   Pneumonia    20 years ago   PONV (postoperative nausea and vomiting)    also low blood pressure at times. do not use lactated ringers. must have sugar with saline d/t adrenal insufficiency   Refusal of blood transfusions as patient is Jehovah's Witness    Renal cyst 2019   Sickle cell trait (HCC)    Stroke (HCC) 2014   small peripheral stroke. Mononeuritis multiplex   Subjective visual disturbance 07/14/2012   Superficial phlebitis and  thrombophlebitis of both lower extremities 2019   Tear of lateral meniscus of right knee, current    Tear of lateral & medial meniscus of right knee   Tinnitus    left ear sees Dr Ezzard Standing (ENT)   Uveitis    Visual changes 2019   left eye vision only with distance, right eye has floater. vascular changes   Past Surgical History:  Procedure Laterality Date   ANTERIOR CERVICAL DECOMP/DISCECTOMY FUSION N/A 08/22/2013   Procedure: ANTERIOR CERVICAL DECOMPRESSION/DISCECTOMY FUSION 2 LEVELS;  Surgeon: Karn Cassis, MD;  Location: [facility];  Service: Neurosurgery;  Laterality: N/A;  C4-5 C5-6 Anterior cervical decompression/diskectomy/fusion   ANTERIOR FUSION CERVICAL SPINE  08/22/2013   c4 5  6          AXILLARY SURGERY     BACK SURGERY     BREAST EXCISIONAL BIOPSY Right 1986   BREAST SURGERY     Cysts excised   CARDIAC CATHETERIZATION  09/18/2013   CESAREAN SECTION  03/2001   x 1   CHOLECYSTECTOMY  12/2004   COLONOSCOPY  03/2013   DILATION AND CURETTAGE OF UTERUS     EXCISION OF BREAST LESION Right 08/18/2017   Procedure: EXCISION OF RIGHT AXILLARY SOFT TISSUE MASS;  Surgeon: Carolan Shiver, MD;  Location: [facility];  Service: General;  Laterality: Right;   FEMUR IM NAIL Left 04/02/2020   Procedure:  INTRAMEDULLARY (IM) NAIL FEMORAL;  Surgeon: Renette Butters, MD;  Location: [facility];  Service: Orthopedics;  Laterality: Left;   HAND SURGERY Right 07/2001   dupytren   HYSTEROSCOPY     IR SI JOINT INJ / ARTH LEFT W/IMAG GUIDE  2018   si fusion , left    IR SI JOINT INJ / ARTH RIGHT W/IMAG GUIDE Right 2018   JOINT REPLACEMENT Right 11/2016   partial knee replacement   KNEE ARTHROSCOPY WITH LATERAL MENISECTOMY Right 12/12/2012   Procedure: KNEE ARTHROSCOPY WITH LATERAL MENISECTOMY;  Surgeon: Lorn Junes, MD;  Location: [place];  Service: Orthopedics;  Laterality: Right;   KNEE SURGERY Left 05/1998   LEFT HEART CATHETERIZATION WITH CORONARY ANGIOGRAM N/A  09/18/2013   Procedure: LEFT HEART CATHETERIZATION WITH CORONARY ANGIOGRAM;  Surgeon: Blane Ohara, MD;  Location: [facility] C[facility];  Service: Cardiovascular;  Laterality: N/A;   LUMBAR DISC SURGERY     OOPHORECTOMY     BSO   PELVIC LAPAROSCOPY     DL   SPINE SURGERY     SUBMANDIBULAR MASS EXCISION     THYROID CYST EXCISION  1983   thyroglossal duct cyst   VAGINAL HYSTERECTOMY  07/2007   LAVH BSO    Allergies  Allergies  Allergen Reactions   Celebrex [Celecoxib] Other (See Comments)    Bleeding/bruising including bleeding for multiple days This is a reaction d/t sulfa product   Fentanyl Nausea And Vomiting    Had mild heart attack after last surgery along with vomiting severely. Does NOT want fentanyl at all.  Versed and valium are okay   Latex Shortness Of Breath and Dermatitis   Sulfa Drugs Cross Reactors Other (See Comments)    Eyes turn red, renal failure, stomach problems (vomiting, diarrhea), vasculature collapses, migraine like symptoms   Sulfites Other (See Comments)    Eyes turn red, renal failure, stomach problems (vomiting, diarrhea), vasculature collapses, migraine like symptoms   Simponi [Golimumab] Swelling    Simponi Aria- Headache, blurred vision. (similar to remicade, but uses human dna)   Spiractazide [Hydrochlorothiazide W-Spironolactone] Other (See Comments)    Made blood pressure rise   Adhesive [Tape] Other (See Comments)    Pulls skin right off.  PAPER TAPE is okay. tegaderm is also okay   Codeine Itching and Nausea And Vomiting    Tolerates hydrocodone    Epinephrine Other (See Comments)    Tachycardia (this is a normal side effect of this medication)   Erythromycin Itching and Rash    Has tolerated azithromycin   Loratadine Itching   Morphine And Related Itching and Rash    History of Present Illness    60 year old female with the above past medical history including orthostatic hypotension, palpitations (on propanolol), OSA on CPAP, CVA,  Behcet's disease (on chronic prednisone and high-dose aspirin, followed by Rheumatology), RA on Humira, IBS, colitis, adrenal insufficiency (secondary to chronic prednisone use),tremor, GERD, chronic back pain, and steroid induced osteoporosis (followed by endocrinology).   Initially seen by Dr. Sallyanne Kuster in 2015 during a hospitalization for an episode of syncope and atypical chest pain in the setting of orthostatic hypotension and orthostatic tachycardia. Myoview at the time showed small area of mild reversibility in the mid segments of  anterior and inferior septal wall.  Cardiac catheterization showed normal coronaries. Echocardiogram at the time showed an EF of 60 to 65%, no regional wall motion abnormalities. She was evaluated by EP and did not meet the criteria for POTS.  Her symptoms were thought to be secondary to iatrogenic adrenal insufficiency. Repeat echocardiogram in February 2021 showed an EF of 60 to 65%, no wall motion abnormalities.  She was last seen in the office in August 2021 doing well from a cardiac standpoint.  She was exercising and her palpitations are well controlled on propanolol.  Reported no further episodes of orthostatic hypotension.  Her blood pressure was well controlled on a regimen of hydrochlorothiazide (to help with her hypocalcemia) and spironolactone (to help with hypokalemia caused by hydrochlorothiazide).  Routine annual follow-up was advised.  Since her last visit she has been doing well from a cardiac standpoint.  She stopped taking the spironolactone-HCTZ as she states it caused an elevation in her blood pressure. Blood pressure is stable in office today at 112/72. She states her activity has been limited more recently in the setting of lower back pain though she is still able to climb stairs and unload groceries without any symptoms of angina or dyspnea. She is hoping to have back surgery in the near future and is followed by orthopedics. She reports rare  palpitations when she is under high levels of stress, however, she feels overall her symptoms are controlled and much improved on the propanolol. She denies chest pain, dyspnea, pnd, orthopnea, n, v, dizziness, syncope, edema, weight gain, or early satiety.  Home Medications    Current Outpatient Medications  Medication Sig Dispense Refill   acetaminophen (TYLENOL) 500 MG tablet Take 500 mg by mouth 2 (two) times daily.     Adalimumab 40 MG/0.8ML PNKT Inject 40 mg into the skin every Saturday.     Aloe-Sodium Chloride (AYR SALINE NASAL GEL NA) Place 1 spray into the nose daily as needed (congestion).     aspirin EC 325 MG tablet Take 325 mg by mouth 2 (two) times daily.      azelastine (ASTELIN) 0.1 % nasal spray Place 2 sprays into both nostrils once daily 30 mL 3   Calcium Carb-Cholecalciferol (CALCIUM/VITAMIN D) 600-400 MG-UNIT TABS Take 1 tablet by mouth 3 (three) times daily.     colchicine 0.6 MG tablet Take 1 tablet (0.6 mg total) by mouth 2 (two) times daily 180 tablet 1   diphenhydrAMINE (BENADRYL) 25 mg capsule Take 25 mg by mouth at bedtime.     DULoxetine (CYMBALTA) 60 MG capsule Take 60 mg by mouth daily.     fluocinonide cream (LIDEX) [id number] % Apply 1 application topically daily as needed (rash).      fluticasone (FLONASE) 50 MCG/ACT nasal spray Place 1 spray into the nose daily as needed for allergies.      folic acid (FOLVITE) 1 MG tablet TAKE 2 TABLETS BY MOUTH ONCE DAILY IN THE AFTERNOON 180 tablet 3   gabapentin (NEURONTIN) 100 MG capsule TAKE 1 CAPSULE BY MOUTH 3 TIMES DAILY 90 capsule 5   lansoprazole (PREVACID) 30 MG capsule Take 30 mg by mouth daily.     leflunomide (ARAVA) 20 MG tablet TAKE 1 TABLET BY MOUTH EVERY EVENING 90 tablet 3   loperamide (IMODIUM) 2 MG capsule Take 1 capsule (2 mg total) by mouth as needed for diarrhea or loose stools. (Patient taking differently: Take 2-4 mg by mouth See admin instructions. Take 2 capsules (4mg ) at onset of diarrhea, and 1 capsule  (2mg ) every 4 hours after until symptoms subside) 30 capsule 0   Magnesium 500 MG CAPS Take 500 mg by mouth 3 (three) times daily.     Multiple Vitamins-Minerals (BARIATRIC MULTIVITAMINS/IRON PO)  Take 1 tablet by mouth daily.     mupirocin ointment (BACTROBAN) 2 % Apply to affected areas 3  times daily for 7 days 22 g 0   Oral Electrolytes (THERMOTABS PO) Take 2 tablets by mouth daily as needed (hydration). May take an additional 2 more 2 tablet doses as needed for physical activity or heat     OVER THE COUNTER MEDICATION Place 1 drop into both eyes as needed (dry eyes). Crocodile tears otc eye drops     Pancrelipase, Lip-Prot-Amyl, 24000-76000 units CPEP Take 3-5 capsules by mouth See admin instructions. Take 5 capsules by mouth with meals and 3 capsules with snacks     potassium chloride SA (KLOR-CON) 20 MEQ tablet TAKE 1 TABLET BY MOUTH THREE TIMES DAILY 270 tablet 3   predniSONE (DELTASONE) 10 MG tablet Take 1 tablet (10 mg total) by mouth daily.     predniSONE (DELTASONE) 10 MG tablet TAKE 1 TABLET BY MOUTH ONCE DAILY 90 tablet 3   predniSONE (DELTASONE) 2.5 MG tablet TAKE 1 TABLET BY MOUTH AS NEEDED (BAD BEHCET'S PAIN DAYS, TAKE SPARINGLY) FOR UP TO 30 DAYS 30 tablet 1   propranolol (INDERAL) 80 MG tablet TAKE 1 TABLET BY MOUTH TWICE DAILY (REPLACES METOPROLOL) 180 tablet 3   No current facility-administered medications for this visit.     Family History    Family History  Problem Relation Age of Onset   Diabetes Mother    Breast cancer Mother        Age 12's   Stroke Mother    Depression Mother    Hyperlipidemia Mother    Gastric cancer Father    Diabetes Sister    Hypertension Sister    Breast cancer Sister        Age 80   Hyperlipidemia Sister    Thyroid disease Sister    CAD Maternal Grandfather    Polymyositis Maternal Aunt    Myasthenia gravis Maternal Aunt    Lupus Cousin    Rheum arthritis Cousin    Skin cancer Maternal Aunt    Prostate cancer Paternal Uncle     She indicated that her mother is deceased. She indicated that her father is deceased. She indicated that her sister is alive. She indicated that the status of her maternal grandfather is unknown. She indicated that the status of her paternal uncle is unknown.  Social History    Social History   Socioeconomic History   Marital status: Divorced    Spouse name: Not on file   Number of children: 1   Years of education: Not on file   Highest education level: Master's degree (e.g., MA, MS, MEng, MEd, MSW, MBA)  Occupational History   Occupation: disabled  Tobacco Use   Smoking status: Never   Smokeless tobacco: Never  Vaping Use   Vaping Use: Never used  Substance and Sexual Activity   Alcohol use: No    Alcohol/week: 0.0 standard drinks   Drug use: No   Sexual activity: Not Currently    Birth control/protection: Surgical    Comment: 1st intercourse 76 yo-5 partners  Other Topics Concern   Not on file  Social History Narrative   Not on file   Social Determinants of Health   Financial Resource Strain: Not on file  Food Insecurity: Not on file  Transportation Needs: Not on file  Physical Activity: Not on file  Stress: Not on file  Social Connections: Not on file  Intimate Partner Violence: Not on file  Review of Systems    General:  No chills, fever, night sweats or weight changes.  Cardiovascular:  See HPI, rare palpitations. No chest pain, dyspnea on exertion, edema, orthopnea, paroxysmal nocturnal dyspnea. Dermatological: No rash, lesions/masses Respiratory: No cough, dyspnea Urologic: No hematuria, dysuria Abdominal: No nausea, vomiting, diarrhea, bright red blood per rectum, melena, or hematemesis Neurologic: No visual changes, wkns, changes in mental status. All other systems reviewed and are otherwise negative except as noted above.     Physical Exam    VS:  BP 112/72 (BP Location: Left Arm, Patient Position: Sitting, Cuff Size: Normal)   Pulse 72    Resp 20   Ht 5\' 4"  (1.626 m)   Wt 172 lb 9.6 oz (78.3 kg)   BMI 29.63 kg/m       GEN: Well nourished, well developed, in no acute distress. HEENT: normal. Neck: Supple, no JVD, carotid bruits, or masses. Cardiac: RRR, no murmurs, rubs, or gallops. No clubbing, cyanosis, edema.  Radials/DP/PT 2+ and equal bilaterally.  Respiratory:  Respirations regular and unlabored, clear to auscultation bilaterally. GI: Soft, nontender, nondistended, BS + x 4. MS: no deformity or atrophy. Skin: warm and dry, no rash. Neuro:  Strength and sensation are intact. Psych: Normal affect.  Accessory Clinical Findings    ECG personally reviewed by me today- NRS, 72 bpm, low voltage QRS - No acute changes  Lab Results  Component Value Date   WBC 11.5 (H) 04/03/2020   HGB 10.7 (L) 04/03/2020   HCT 33.1 (L) 04/03/2020   MCV 94.3 04/03/2020   PLT 124 (L) 04/03/2020   Lab Results  Component Value Date   CREATININE 0.67 04/03/2020   BUN 16 04/03/2020   NA 140 04/03/2020   K 3.6 04/03/2020   CL 104 04/03/2020   CO2 28 04/03/2020   Lab Results  Component Value Date   ALT 20 04/03/2020   AST 30 04/03/2020   ALKPHOS 42 04/03/2020   BILITOT 0.7 04/03/2020   Lab Results  Component Value Date   CHOL 175 09/18/2013   HDL 52 09/18/2013   LDLCALC 108 (H) 09/18/2013   TRIG 77 09/18/2013   CHOLHDL 3.4 09/18/2013    Lab Results  Component Value Date   HGBA1C 5.8 (H) 04/01/2020    Assessment & Plan   1.  Orthostatic hypotension: In the setting of iatrogenic adrenal insufficiency.  Previously evaluated by EP, did not meet criteria for POTS.  Seems to be resolved.  She denies symptoms of dizziness, presyncope, syncope.  She occasionally feels lightheaded after standing still for long periods of time but this resolves with movement.  She is no longer taking spironolactone-HCTZ 25-25 mg daily due to elevated blood pressures. BP well controlled, 112/72 in office today.  Will not resume spironolactone-HCTZ  at this time.  2. Palpitations: Controlled on propanolol 80 mg twice daily, which she also takes for her tremor.  Continue same. 3. History of abnormal Myoview: Dating back to 2015, cardiac catheterization in at the time showed normal coronaries.  No symptoms of angina. Continue lifestyle modifications with diet and exercise.  She is on high-dose aspirin per Rheumatology. 4. Pre-op Clearance for anticipated back surgery: Currently able to complete greater than 4 METS.  She is able to unload her groceries from her car and climb a flight of stairs without symptoms of angina or dyspnea. RCRI 3.9% risk of MACE, class I risk. Therefore, based on ACC/AHA guidelines, patient would be at acceptable risk for the planned procedure  without further cardiovascular testing.  She does take aspirin 325 mg twice daily per rheumatology. However, she does not take ASA for cardiac purposes, therefore will defer ASA management to Rheumatology.  5. Disposition: Stable from cardiac standpoint. Will refill propanolol 80 mg twice daily. Follow-up in 1 year.   Lenna Sciara, NP 12/03/2020, 4:22 PM

## 2020-12-03 ENCOUNTER — Other Ambulatory Visit (HOSPITAL_COMMUNITY): Payer: Self-pay

## 2020-12-03 ENCOUNTER — Other Ambulatory Visit: Payer: Self-pay

## 2020-12-03 ENCOUNTER — Encounter: Payer: Self-pay | Admitting: Student

## 2020-12-03 ENCOUNTER — Ambulatory Visit: Payer: Medicare Other | Admitting: Nurse Practitioner

## 2020-12-03 VITALS — BP 112/72 | HR 72 | Resp 20 | Ht 64.0 in | Wt 172.6 lb

## 2020-12-03 DIAGNOSIS — Z01818 Encounter for other preprocedural examination: Secondary | ICD-10-CM

## 2020-12-03 DIAGNOSIS — R9439 Abnormal result of other cardiovascular function study: Secondary | ICD-10-CM | POA: Diagnosis not present

## 2020-12-03 DIAGNOSIS — I951 Orthostatic hypotension: Secondary | ICD-10-CM | POA: Diagnosis not present

## 2020-12-03 DIAGNOSIS — R002 Palpitations: Secondary | ICD-10-CM | POA: Diagnosis not present

## 2020-12-03 DIAGNOSIS — Z01810 Encounter for preprocedural cardiovascular examination: Secondary | ICD-10-CM

## 2020-12-03 MED ORDER — PROPRANOLOL HCL 80 MG PO TABS
ORAL_TABLET | ORAL | 3 refills | Status: DC
  Filled 2020-12-03 – 2021-01-01 (×2): qty 180, 90d supply, fill #0
  Filled 2021-04-02: qty 180, 90d supply, fill #1
  Filled 2021-07-05: qty 180, 90d supply, fill #2
  Filled 2021-10-03: qty 180, 90d supply, fill #3

## 2020-12-03 NOTE — Patient Instructions (Signed)
Medication Instructions:  °Your physician recommends that you continue on your current medications as directed. Please refer to the Current Medication list given to you today. ° °*If you need a refill on your cardiac medications before your next appointment, please call your pharmacy* ° °Lab Work: °NONE ordered at this time of appointment  ° °If you have labs (blood work) drawn today and your tests are completely normal, you will receive your results only by: °MyChart Message (if you have MyChart) OR °A paper copy in the mail °If you have any lab test that is abnormal or we need to change your treatment, we will call you to review the results. ° °Testing/Procedures: °NONE ordered at this time of appointment  ° °Follow-Up: °At [facility], you and your health needs are our priority.  As part of our continuing mission to provide you with exceptional heart care, we have created designated Provider Care Teams.  These Care Teams include your primary Cardiologist (physician) and Advanced Practice Providers (APPs -  Physician Assistants and Nurse Practitioners) who all work together to provide you with the care you need, when you need it. ° °Your next appointment:   °1 year(s) ° °The format for your next appointment:   °In Person ° °Provider:   °Mihai Croitoru, MD { ° °Other Instructions ° ° °

## 2020-12-19 ENCOUNTER — Other Ambulatory Visit (HOSPITAL_COMMUNITY): Payer: Self-pay

## 2020-12-26 ENCOUNTER — Other Ambulatory Visit (HOSPITAL_COMMUNITY): Payer: Self-pay

## 2020-12-26 MED ORDER — "BD HYPODERMIC NEEDLE 18G X 1"" MISC"
0 refills | Status: DC
  Filled 2020-12-26: qty 6, 6d supply, fill #0
  Filled 2020-12-26: qty 30, 30d supply, fill #0

## 2020-12-26 MED ORDER — DEXAMETHASONE SODIUM PHOSPHATE 4 MG/ML IJ SOLN
INTRAMUSCULAR | 1 refills | Status: DC
  Filled 2020-12-26: qty 1, 1d supply, fill #0

## 2020-12-26 MED ORDER — "TUBERCULIN SYRINGE 27G X 1/2"" 1 ML MISC"
0 refills | Status: AC
  Filled 2020-12-26: qty 30, 30d supply, fill #0

## 2020-12-26 MED ORDER — "SYRINGE/NEEDLE (DISP) 25G X 1"" 3 ML MISC"
0 refills | Status: AC
  Filled 2020-12-26: qty 30, 30d supply, fill #0

## 2020-12-27 ENCOUNTER — Other Ambulatory Visit (HOSPITAL_COMMUNITY): Payer: Self-pay

## 2021-01-01 ENCOUNTER — Other Ambulatory Visit (HOSPITAL_COMMUNITY): Payer: Self-pay

## 2021-01-02 ENCOUNTER — Other Ambulatory Visit (HOSPITAL_COMMUNITY): Payer: Self-pay

## 2021-01-03 ENCOUNTER — Other Ambulatory Visit (HOSPITAL_COMMUNITY): Payer: Self-pay

## 2021-01-16 ENCOUNTER — Other Ambulatory Visit (HOSPITAL_COMMUNITY): Payer: Self-pay

## 2021-01-16 MED ORDER — LEFLUNOMIDE 20 MG PO TABS
ORAL_TABLET | ORAL | 3 refills | Status: DC
  Filled 2021-01-16: qty 90, 90d supply, fill #0
  Filled 2021-04-23: qty 90, 90d supply, fill #1
  Filled 2021-07-24: qty 90, 90d supply, fill #2

## 2021-01-16 MED ORDER — PREDNISONE 2.5 MG PO TABS
ORAL_TABLET | ORAL | 1 refills | Status: AC
  Filled 2021-01-16: qty 60, 60d supply, fill #0
  Filled 2021-06-17: qty 6, 6d supply, fill #1
  Filled 2021-06-17: qty 54, 54d supply, fill #1

## 2021-01-16 MED ORDER — FOLIC ACID 1 MG PO TABS
ORAL_TABLET | ORAL | 3 refills | Status: DC
  Filled 2021-01-16: qty 180, 90d supply, fill #0
  Filled 2021-05-14: qty 180, 90d supply, fill #1
  Filled 2021-08-08: qty 180, 90d supply, fill #2
  Filled 2021-11-04: qty 180, 90d supply, fill #3

## 2021-01-16 MED ORDER — PREDNISONE 10 MG PO TABS
10.0000 mg | ORAL_TABLET | Freq: Every day | ORAL | 3 refills | Status: DC
  Filled 2021-01-16 – 2021-03-03 (×2): qty 90, 90d supply, fill #0
  Filled 2021-05-28: qty 90, 90d supply, fill #1
  Filled 2021-06-18: qty 90, 90d supply, fill #2
  Filled 2021-09-04: qty 90, 90d supply, fill #3

## 2021-01-17 ENCOUNTER — Other Ambulatory Visit (HOSPITAL_COMMUNITY): Payer: Self-pay

## 2021-01-21 ENCOUNTER — Other Ambulatory Visit (HOSPITAL_COMMUNITY): Payer: Self-pay

## 2021-01-22 ENCOUNTER — Other Ambulatory Visit (HOSPITAL_COMMUNITY): Payer: Self-pay

## 2021-01-22 MED ORDER — LANSOPRAZOLE 30 MG PO CPDR
DELAYED_RELEASE_CAPSULE | ORAL | 3 refills | Status: DC
  Filled 2021-01-22: qty 90, 90d supply, fill #0
  Filled 2021-04-23: qty 90, 90d supply, fill #1
  Filled 2021-07-24: qty 90, 90d supply, fill #2
  Filled 2021-10-27: qty 90, 90d supply, fill #3

## 2021-01-23 ENCOUNTER — Other Ambulatory Visit (HOSPITAL_COMMUNITY): Payer: Self-pay

## 2021-01-27 ENCOUNTER — Other Ambulatory Visit (HOSPITAL_COMMUNITY): Payer: Self-pay

## 2021-01-27 MED ORDER — DULOXETINE HCL 60 MG PO CPEP
ORAL_CAPSULE | ORAL | 3 refills | Status: DC
  Filled 2021-01-27: qty 90, 90d supply, fill #0
  Filled 2021-04-24: qty 90, 90d supply, fill #1
  Filled 2021-07-24: qty 90, 90d supply, fill #2
  Filled 2021-10-27: qty 90, 90d supply, fill #3

## 2021-01-30 ENCOUNTER — Other Ambulatory Visit (HOSPITAL_COMMUNITY): Payer: Self-pay

## 2021-02-04 ENCOUNTER — Other Ambulatory Visit (HOSPITAL_COMMUNITY): Payer: Self-pay

## 2021-02-05 ENCOUNTER — Other Ambulatory Visit (HOSPITAL_COMMUNITY): Payer: Self-pay

## 2021-02-05 MED ORDER — GABAPENTIN 100 MG PO CAPS
100.0000 mg | ORAL_CAPSULE | Freq: Three times a day (TID) | ORAL | 3 refills | Status: DC
  Filled 2021-02-05: qty 270, 90d supply, fill #0
  Filled 2021-05-13: qty 270, 90d supply, fill #1
  Filled 2021-08-08: qty 270, 90d supply, fill #2
  Filled 2022-01-26: qty 270, 90d supply, fill #3

## 2021-02-20 ENCOUNTER — Other Ambulatory Visit (HOSPITAL_COMMUNITY): Payer: Self-pay

## 2021-02-21 ENCOUNTER — Other Ambulatory Visit (HOSPITAL_COMMUNITY): Payer: Self-pay

## 2021-02-28 ENCOUNTER — Other Ambulatory Visit (HOSPITAL_COMMUNITY): Payer: Self-pay

## 2021-02-28 MED ORDER — VITAMIN A 2400 MCG (8000 UT) PO CAPS: ORAL_CAPSULE | ORAL | 2 refills | Status: DC

## 2021-03-03 ENCOUNTER — Other Ambulatory Visit (HOSPITAL_COMMUNITY): Payer: Self-pay

## 2021-03-04 ENCOUNTER — Other Ambulatory Visit (HOSPITAL_COMMUNITY): Payer: Self-pay

## 2021-03-24 ENCOUNTER — Other Ambulatory Visit (HOSPITAL_COMMUNITY): Payer: Self-pay

## 2021-04-04 ENCOUNTER — Other Ambulatory Visit (HOSPITAL_COMMUNITY): Payer: Self-pay

## 2021-04-23 ENCOUNTER — Other Ambulatory Visit (HOSPITAL_COMMUNITY): Payer: Self-pay

## 2021-04-24 ENCOUNTER — Other Ambulatory Visit (HOSPITAL_COMMUNITY): Payer: Self-pay

## 2021-05-13 ENCOUNTER — Other Ambulatory Visit (HOSPITAL_COMMUNITY): Payer: Self-pay

## 2021-05-14 ENCOUNTER — Other Ambulatory Visit (HOSPITAL_COMMUNITY): Payer: Self-pay

## 2021-05-28 ENCOUNTER — Other Ambulatory Visit (HOSPITAL_COMMUNITY): Payer: Self-pay

## 2021-05-29 ENCOUNTER — Other Ambulatory Visit (HOSPITAL_COMMUNITY): Payer: Self-pay

## 2021-05-29 MED ORDER — COLCHICINE 0.6 MG PO TABS
ORAL_TABLET | ORAL | 1 refills | Status: DC
  Filled 2021-05-29: qty 180, 90d supply, fill #0
  Filled 2021-08-26: qty 180, 90d supply, fill #1

## 2021-06-17 ENCOUNTER — Other Ambulatory Visit (HOSPITAL_COMMUNITY): Payer: Self-pay

## 2021-06-18 ENCOUNTER — Other Ambulatory Visit (HOSPITAL_COMMUNITY): Payer: Self-pay

## 2021-06-19 ENCOUNTER — Other Ambulatory Visit (HOSPITAL_COMMUNITY): Payer: Self-pay

## 2021-06-19 MED ORDER — POTASSIUM CHLORIDE CRYS ER 20 MEQ PO TBCR
20.0000 meq | EXTENDED_RELEASE_TABLET | Freq: Three times a day (TID) | ORAL | 3 refills | Status: DC
Start: 2021-06-18 — End: 2022-06-26
  Filled 2021-06-19: qty 270, 90d supply, fill #0
  Filled 2021-09-22 – 2021-09-26 (×2): qty 270, 90d supply, fill #1
  Filled 2021-12-25: qty 270, 90d supply, fill #2
  Filled 2022-04-02: qty 270, 90d supply, fill #3

## 2021-07-09 ENCOUNTER — Other Ambulatory Visit (HOSPITAL_COMMUNITY): Payer: Self-pay

## 2021-07-24 ENCOUNTER — Other Ambulatory Visit (HOSPITAL_COMMUNITY): Payer: Self-pay

## 2021-07-25 ENCOUNTER — Other Ambulatory Visit (HOSPITAL_COMMUNITY): Payer: Self-pay

## 2021-08-06 ENCOUNTER — Other Ambulatory Visit (HOSPITAL_COMMUNITY): Payer: Self-pay

## 2021-08-06 MED ORDER — FLUTICASONE FUROATE-VILANTEROL 200-25 MCG/ACT IN AEPB
INHALATION_SPRAY | RESPIRATORY_TRACT | 6 refills | Status: DC
  Filled 2021-08-06: qty 60, 30d supply, fill #0

## 2021-08-07 ENCOUNTER — Other Ambulatory Visit (HOSPITAL_COMMUNITY): Payer: Self-pay

## 2021-08-07 MED ORDER — ATORVASTATIN CALCIUM 20 MG PO TABS
ORAL_TABLET | ORAL | 11 refills | Status: DC
  Filled 2021-08-07: qty 30, 30d supply, fill #0
  Filled 2021-09-04: qty 30, 30d supply, fill #1
  Filled 2021-10-03: qty 30, 30d supply, fill #2
  Filled 2021-11-04: qty 30, 30d supply, fill #3
  Filled 2021-12-13: qty 30, 30d supply, fill #4
  Filled 2022-01-11: qty 30, 30d supply, fill #5
  Filled 2022-02-06: qty 30, 30d supply, fill #6
  Filled 2022-03-15: qty 30, 30d supply, fill #7
  Filled 2022-04-13: qty 30, 30d supply, fill #8
  Filled 2022-05-18: qty 30, 30d supply, fill #9
  Filled 2022-06-29: qty 30, 30d supply, fill #10
  Filled 2022-08-04 – 2022-08-05 (×2): qty 30, 30d supply, fill #11

## 2021-08-09 ENCOUNTER — Other Ambulatory Visit (HOSPITAL_COMMUNITY): Payer: Self-pay

## 2021-08-26 ENCOUNTER — Other Ambulatory Visit (HOSPITAL_COMMUNITY): Payer: Self-pay

## 2021-08-26 MED ORDER — TRELEGY ELLIPTA 100-62.5-25 MCG/ACT IN AEPB
INHALATION_SPRAY | RESPIRATORY_TRACT | 5 refills | Status: DC
  Filled 2021-08-26: qty 60, 30d supply, fill #0

## 2021-08-26 MED ORDER — NYSTATIN 100000 UNIT/ML MT SUSP
OROMUCOSAL | 3 refills | Status: DC
  Filled 2021-08-26: qty 120, 3d supply, fill #0
  Filled 2021-09-22: qty 120, 3d supply, fill #1

## 2021-09-04 ENCOUNTER — Other Ambulatory Visit (HOSPITAL_COMMUNITY): Payer: Self-pay

## 2021-09-04 MED ORDER — FLUCONAZOLE 200 MG PO TABS
200.0000 mg | ORAL_TABLET | Freq: Every day | ORAL | 0 refills | Status: DC
  Filled 2021-09-04: qty 14, 14d supply, fill #0

## 2021-09-05 ENCOUNTER — Other Ambulatory Visit (HOSPITAL_COMMUNITY): Payer: Self-pay

## 2021-09-23 ENCOUNTER — Other Ambulatory Visit (HOSPITAL_COMMUNITY): Payer: Self-pay

## 2021-09-26 ENCOUNTER — Other Ambulatory Visit (HOSPITAL_COMMUNITY): Payer: Self-pay

## 2021-09-27 ENCOUNTER — Other Ambulatory Visit (HOSPITAL_COMMUNITY): Payer: Self-pay

## 2021-10-01 IMAGING — CR DG CHEST 2V
2 series · 2 of 2 positions shown · non-contrast
Comparison: December 17, 2014

CLINICAL DATA: Chest pain

EXAM:
CHEST - 2 VIEW

[w chest pa]
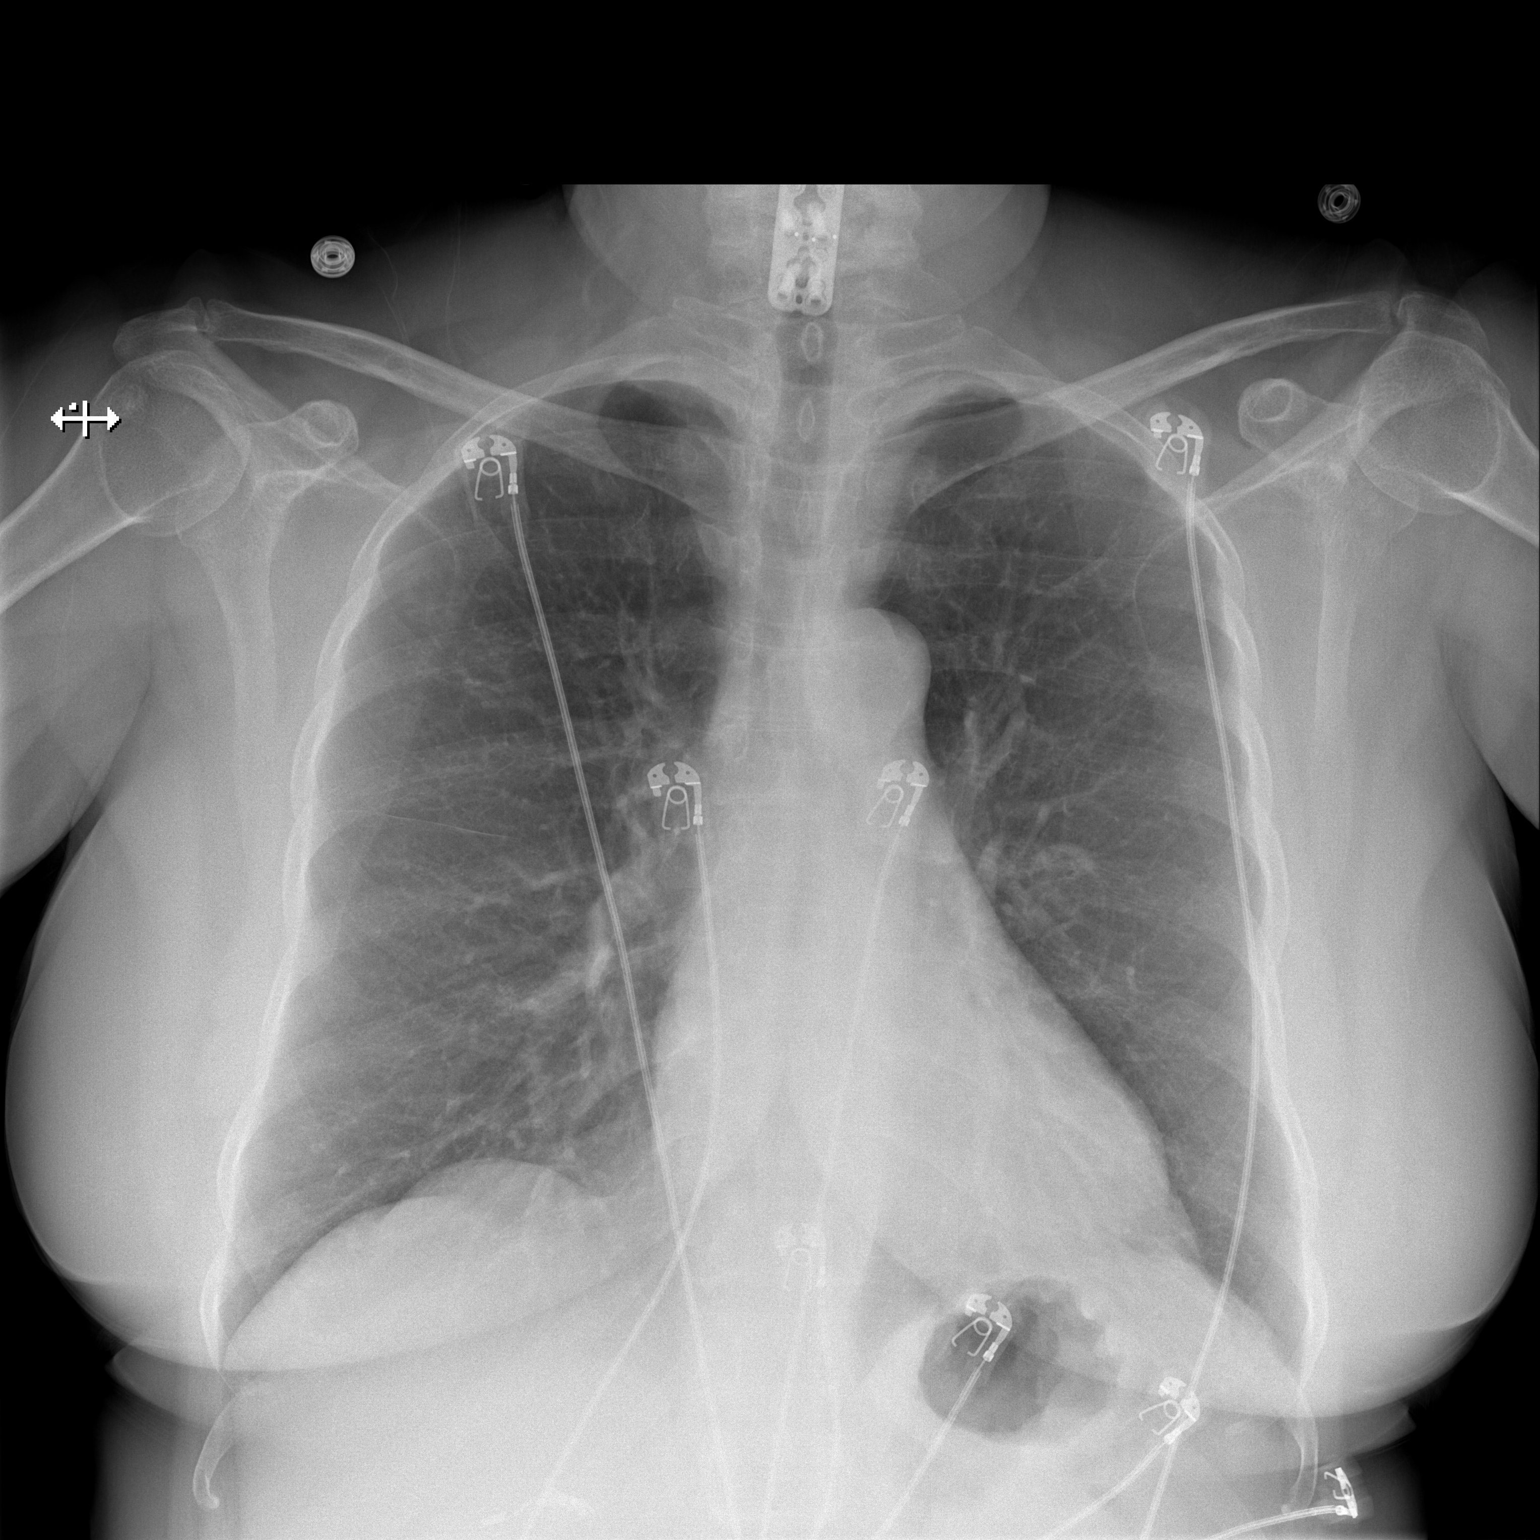

[w chest lat]
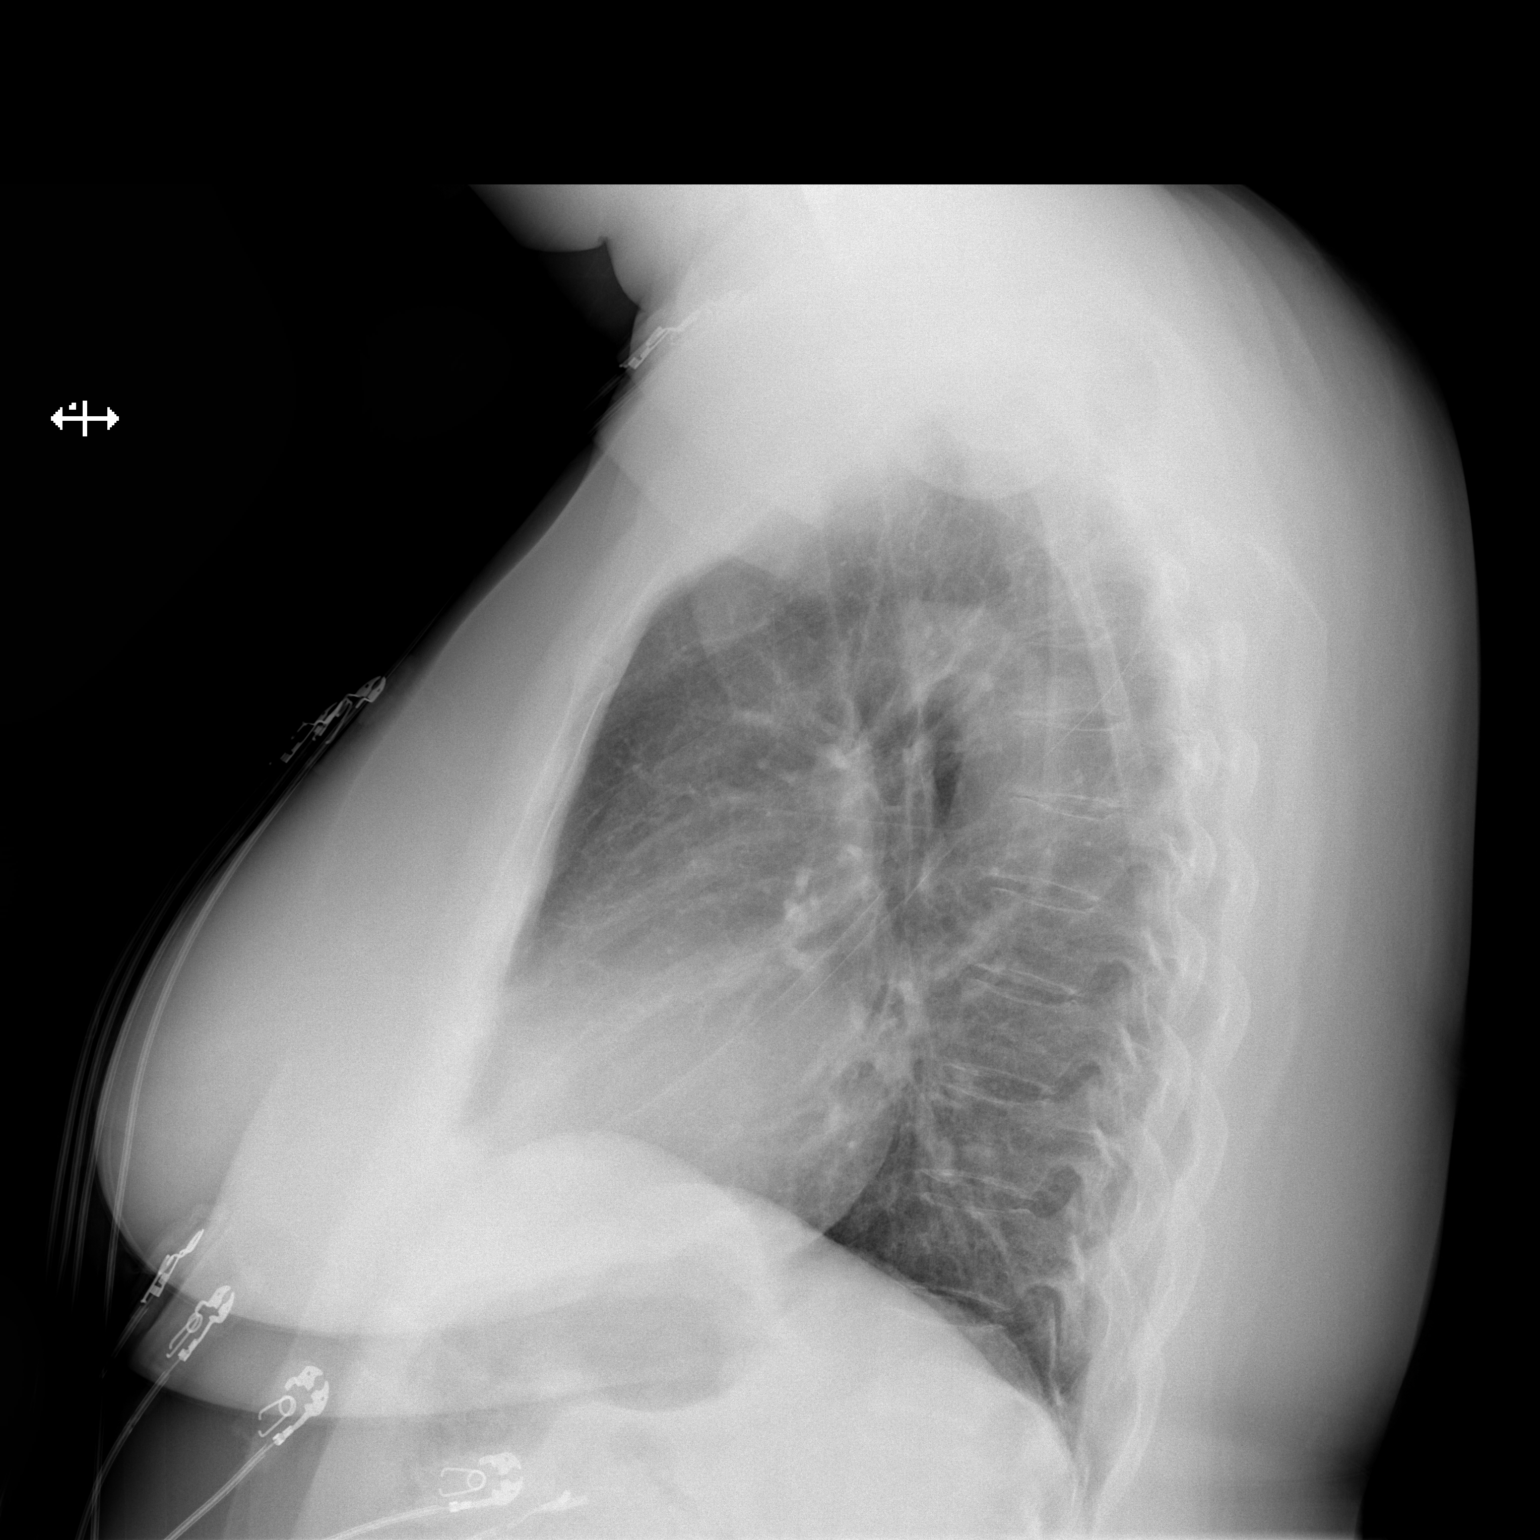

[2 of 2 positions shown; findings below may reference images not displayed]

FINDINGS: Lungs are clear. Heart size and pulmonary vascularity are normal. No
adenopathy. There is postoperative change in the lower cervical
region. No pneumothorax.
IMPRESSION: Lungs clear.  Heart size normal.

## 2021-10-03 ENCOUNTER — Other Ambulatory Visit (HOSPITAL_COMMUNITY): Payer: Self-pay

## 2021-10-06 ENCOUNTER — Other Ambulatory Visit (HOSPITAL_COMMUNITY): Payer: Self-pay

## 2021-10-08 ENCOUNTER — Other Ambulatory Visit (HOSPITAL_COMMUNITY): Payer: Self-pay

## 2021-10-27 ENCOUNTER — Other Ambulatory Visit (HOSPITAL_COMMUNITY): Payer: Self-pay

## 2021-11-04 ENCOUNTER — Other Ambulatory Visit (HOSPITAL_COMMUNITY): Payer: Self-pay

## 2021-11-07 ENCOUNTER — Other Ambulatory Visit (HOSPITAL_COMMUNITY): Payer: Self-pay

## 2021-11-11 ENCOUNTER — Other Ambulatory Visit (HOSPITAL_COMMUNITY): Payer: Self-pay

## 2021-11-11 MED ORDER — ONDANSETRON HCL 4 MG PO TABS
ORAL_TABLET | ORAL | 0 refills | Status: DC
  Filled 2021-11-11: qty 20, 7d supply, fill #0

## 2021-11-11 MED ORDER — BENZOYL PEROXIDE WASH 10 % EX LIQD
CUTANEOUS | 0 refills | Status: DC
  Filled 2021-11-11: qty 227, 2d supply, fill #0

## 2021-11-11 MED ORDER — DOCUSATE SODIUM 100 MG PO CAPS
ORAL_CAPSULE | ORAL | 0 refills | Status: DC
  Filled 2021-11-11: qty 20, 10d supply, fill #0

## 2021-11-24 ENCOUNTER — Other Ambulatory Visit (HOSPITAL_COMMUNITY): Payer: Self-pay

## 2021-11-24 NOTE — Therapy (Signed)
OUTPATIENT PHYSICAL THERAPY SHOULDER EVALUATION   Patient Name: [name] MRN: [id number] DOB:03/27/1960, 61 y.o., female Today's Date: 11/25/2021  END OF SESSION:  PT End of Session - 11/25/21 1021     Visit Number 1    Number of Visits 20    Date for PT Re-Evaluation 02/17/22    Authorization Type MCR    PT Start Time 0930    PT Stop Time 1010    PT Time Calculation (min) 40 min    Activity Tolerance Patient tolerated treatment well             Past Medical History:  Diagnosis Date   Acid reflux    Adrenal insufficiency ([place])    Anxiety    when she gets sick she gets anxious   Arrhythmia    Arthritis    inflammatory arthritis   Asthma    Behcet's syndrome (HCC)    Chronic back pain    Chronic neck pain    Colitis    Connective tissue disorder (HCC)    Depression    Dysrhythmia    POTS syndrome   Endometritis    Foot drop    due to muscle weakness from immunosuppressants   GERD (gastroesophageal reflux disease)    Gluten intolerance    Heart murmur    IBS (irritable bowel syndrome)    Myocardial infarction ([place])    per pt, mild mi due to immunosuppressant therapy   Nasal ulcer    gets occasionally from medications   Osteoarthritis    Osteoporosis    T score reported -2.7   Pneumonia    20 years ago   PONV (postoperative nausea and vomiting)    also low blood pressure at times. do not use lactated ringers. must have sugar with saline d/t adrenal insufficiency   Refusal of blood transfusions as patient is Jehovah's Witness    Renal cyst 2019   Sickle cell trait ([place])    Stroke ([place]) 2014   small peripheral stroke. Mononeuritis multiplex   Subjective visual disturbance 07/14/2012   Superficial phlebitis and thrombophlebitis of both lower extremities 2019   Tear of lateral meniscus of right knee, current    Tear of lateral & medial meniscus of right knee   Tinnitus    left ear sees Dr Lucia Gaskins (ENT)   Uveitis    Visual changes 2019    left eye vision only with distance, right eye has floater. vascular changes   Past Surgical History:  Procedure Laterality Date   ANTERIOR CERVICAL DECOMP/DISCECTOMY FUSION N/A 08/22/2013   Procedure: ANTERIOR CERVICAL DECOMPRESSION/DISCECTOMY FUSION 2 LEVELS;  Surgeon: Floyce Stakes, MD;  Location: [facility];  Service: Neurosurgery;  Laterality: N/A;  C4-5 C5-6 Anterior cervical decompression/diskectomy/fusion   ANTERIOR FUSION CERVICAL SPINE  08/22/2013   c4 5  6          AXILLARY SURGERY     BACK SURGERY     BREAST EXCISIONAL BIOPSY Right 1986   BREAST SURGERY     Cysts excised   CARDIAC CATHETERIZATION  09/18/2013   CESAREAN SECTION  03/2001   x 1   CHOLECYSTECTOMY  12/2004   COLONOSCOPY  03/2013   DILATION AND CURETTAGE OF UTERUS     EXCISION OF BREAST LESION Right 08/18/2017   Procedure: EXCISION OF RIGHT AXILLARY SOFT TISSUE MASS;  Surgeon: Herbert Pun, MD;  Location: [facility];  Service: General;  Laterality: Right;   FEMUR IM NAIL Left 04/02/2020   Procedure:  INTRAMEDULLARY (IM) NAIL FEMORAL;  Surgeon: Renette Butters, MD;  Location: [facility];  Service: Orthopedics;  Laterality: Left;   HAND SURGERY Right 07/2001   dupytren   HYSTEROSCOPY     IR SI JOINT INJ / ARTH LEFT W/IMAG GUIDE  2018   si fusion , left    IR SI JOINT INJ / ARTH RIGHT W/IMAG GUIDE Right 2018   JOINT REPLACEMENT Right 11/2016   partial knee replacement   KNEE ARTHROSCOPY WITH LATERAL MENISECTOMY Right 12/12/2012   Procedure: KNEE ARTHROSCOPY WITH LATERAL MENISECTOMY;  Surgeon: Lorn Junes, MD;  Location: [place];  Service: Orthopedics;  Laterality: Right;   KNEE SURGERY Left 05/1998   LEFT HEART CATHETERIZATION WITH CORONARY ANGIOGRAM N/A 09/18/2013   Procedure: LEFT HEART CATHETERIZATION WITH CORONARY ANGIOGRAM;  Surgeon: Blane Ohara, MD;  Location: [facility] CATH LAB;  Service: Cardiovascular;  Laterality: N/A;   LUMBAR DISC SURGERY     OOPHORECTOMY     BSO   PELVIC  LAPAROSCOPY     DL   SPINE SURGERY     SUBMANDIBULAR MASS EXCISION     THYROID CYST EXCISION  1983   thyroglossal duct cyst   VAGINAL HYSTERECTOMY  07/2007   LAVH BSO   Patient Active Problem List   Diagnosis Date Noted   Pancreatic insufficiency 04/01/2020   Tremor 08/04/2019   Abnormal cardiovascular stress test 08/04/2019   Hypokalemia 02/15/2019   Chest pain 02/15/2019   Iatrogenic adrenal insufficiency ([place]) 01/15/2018   Orthostatic hypotension 12/07/2013   Adrenal insufficiency ([place]) 09/25/2013   Palpitations 09/16/2013   Cervical stenosis of spinal canal 08/22/2013   Lumbar degenerative disc disease 12/30/2012   Tear of lateral meniscus of right knee, current    Osteopenia    Lumbar back pain 09/13/2012   Subjective visual disturbance 07/14/2012   Endometriosis    Endometrial polyp    IBS (irritable bowel syndrome)    Acid reflux    Colitis    Behcet's syndrome (HCC)    Glucose intolerance (impaired glucose tolerance) 12/19/2010   Behcet's disease ([place]) 01/06/2004    PCP: Evalee Mutton, MD   REFERRING PROVIDER: Ewing, Sharyn Blitz   REFERRING DIAG: M19.011 (ICD-10-CM) - Primary osteoarthritis, right shoulder   THERAPY DIAG:  Acute pain of right shoulder  Muscle weakness (generalized)  Localized edema  Rationale for Evaluation and Treatment: Rehabilitation  ONSET DATE: Rt TSA with biceps tenodesis 11/17/21  SUBJECTIVE:  SUBJECTIVE STATEMENT: She reports doing well after surgery, not having the pain in her shoulder she had before surgery  PERTINENT HISTORY: Rt TSA with biceps tenodesis 11/17/21  PAIN:  Are you having pain? Yes: NPRS scale: 2/10 Pain location: Rt shoulder around incision Pain description: ache, no N/T reported Aggravating factors: movement of her Rt  arm Relieving factors: rest, ice  PRECAUTIONS: Shoulder, see protocol in clinic folder  WEIGHT BEARING RESTRICTIONS: Non weight bearing on Rt shoulder until week 6  FALLS:  Has patient fallen in last 6 months? Yes. Number of falls 1   OCCUPATION:   PLOF: Independent with basic ADLs  PATIENT GOALS: improve use of her Rt arm  NEXT MD VISIT:   OBJECTIVE:   DIAGNOSTIC FINDINGS:    PATIENT SURVEYS:  FOTO 4% functional score at eval, goal is 44%  COGNITION: Overall cognitive status: Within functional limits for tasks assessed     SENSATION: WFL  POSTURE:   UPPER EXTREMITY ROM:   Passive ROM Right eval Left eval  Shoulder flexion 85   Shoulder extension    Shoulder abduction 60   Shoulder adduction    Shoulder internal rotation To belly Arm by side   Shoulder external rotation 14 Arm by side   Elbow flexion    Elbow extension    Wrist flexion    Wrist extension    Wrist ulnar deviation    Wrist radial deviation    Wrist pronation    Wrist supination    (Blank rows = not tested)  UPPER EXTREMITY MMT:  MMT  Right eval Left eval  Shoulder flexion    Shoulder extension    Shoulder abduction    Shoulder adduction    Shoulder internal rotation    Shoulder external rotation    Middle trapezius    Lower trapezius    Elbow flexion    Elbow extension    Wrist flexion    Wrist extension    Wrist ulnar deviation    Wrist radial deviation    Wrist pronation    Wrist supination    Grip strength (lbs)    (Blank rows = not tested)  SHOULDER SPECIAL TESTS:   JOINT MOBILITY TESTING:  Not tested today post op, Pt reports she has hypomobility at baseline  PALPATION:     TODAY'S TREATMENT:  Eval Manual therapy for Rt shoulder PROM gentle each direction per post op ROM limits HEP creation and review with trial set of 10 reps eachperformed, see below for details   PATIENT EDUCATION: Education details: HEP, PT plan of care, post op precautions  review Person educated: Patient Education method: Explanation, Demonstration, Verbal cues, and Handouts Education comprehension: verbalized understanding and needs further education   HOME EXERCISE PROGRAM: Access Code: [email or web address] URL: [email or web address] Date: 11/25/2021 Prepared by: Ivery Quale  Exercises - Circular Shoulder Pendulum with Table Support  - 3 x daily - 7 x weekly - 1-2 sets - 10 reps - Seated Shoulder Abduction Towel Slide at Table Top  - 2 x daily - 6 x weekly - 1-2 sets - 10 reps - 5 sec hold - Seated Shoulder Flexion Towel Slide at Table Top  - 2 x daily - 6 x weekly - 1-2 sets - 10 reps - 5 sec hold - Seated Shoulder External Rotation PROM on Table  - 2-3 x daily - 6 x weekly - 1-2 sets - 10 reps - 5 sec hold - Seated Gripping Towel  - 2 x daily - 6  x weekly - 1 sets - 20 reps - Elbow AROM Flexion & Extension Supinated Forearm  - 2 x daily - 6 x weekly - 1-2 sets - 10 reps - Seated Scapular Retraction  - 2 x daily - 6 x weekly - 1-2 sets - 10 reps  ASSESSMENT:  CLINICAL IMPRESSION: Patient referred to PT S/P Rt TSA with biceps tenodesis 11/17/21 (see protocol summary below in plan section). Patient will benefit from skilled PT to address below impairments, limitations and improve overall function.  OBJECTIVE IMPAIRMENTS: decreased activity tolerance, decreased shoulder mobility, decreased ROM, decreased strength, impaired flexibility, impaired UE use, postural dysfunction, and pain.  ACTIVITY LIMITATIONS: reaching, lifting, carry,  cleaning, driving, and or occupation  PERSONAL FACTORS: PMH significant for Behcet's disease (on Humira), RA, secondary adrenal insufficiency, OSA not on CPAP, mild reactive airway disease, pituitary adenoma, IDA, IBS, chronic diarrhea, chronic GERD, fat malabsorption and steroid induced osteoporosis.  also affecting patient's functional outcome.  REHAB POTENTIAL: Good  CLINICAL DECISION MAKING:  Stable/uncomplicated  EVALUATION COMPLEXITY: Low    GOALS: Short term PT Goals Target date: 12/23/2021 Pt will be I and compliant with HEP. Baseline:  Goal status: New Pt will decrease pain by 25% overall Baseline: Goal status: New  Long term PT goals Target date: 02/17/2022 Pt will improve Rt shoulder AROM to [facility] to improve functional reaching Baseline: Goal status: New Pt will improve  Rt shoulder strength to at least 4+/5 MMT to improve functional strength Baseline: Goal status: New Pt will improve FOTO to at least 44% functional to show improved function Baseline: Goal status: New Pt will reduce pain to overall less than 3/10 with usual activity and work activity. Baseline: Goal status: New  PLAN: PT FREQUENCY: 1-3 times per week   PT DURATION: 12 weeks  PLANNED INTERVENTIONS (unless contraindicated): aquatic PT, Canalith repositioning, cryotherapy, Electrical stimulation, Iontophoresis with 4 mg/ml dexamethasome, Moist heat, traction, Ultrasound, gait training, Therapeutic exercise, balance training, neuromuscular re-education, patient/family education, prosthetic training, manual techniques, passive ROM, dry needling, taping, vasopnuematic device, vestibular, spinal manipulations, joint manipulations  PLAN FOR NEXT SESSION: protocol is in patient folder summary is as follows: Week 1-6 (until 12/29/21) -sling for 2 weeks then may remove at home, continue to wear in community up to week 6 -avoid hand behind back -avoid >30 deg ER -No shoulder active elevation -No weight bearing on shoulder Week 6-12 (12/29/21 to 02/09/21) -discontinue sling -motion recovery IR without force -Begin active shoulder elevation -may begin ER at 90 deg abd to 60 deg Weight bearing on UE allowed

## 2021-11-25 ENCOUNTER — Ambulatory Visit: Payer: Medicare Other | Admitting: Physical Therapy

## 2021-11-25 ENCOUNTER — Encounter: Payer: Self-pay | Admitting: Physical Therapy

## 2021-11-25 DIAGNOSIS — M25511 Pain in right shoulder: Secondary | ICD-10-CM

## 2021-11-25 DIAGNOSIS — M6281 Muscle weakness (generalized): Secondary | ICD-10-CM

## 2021-11-25 DIAGNOSIS — R600 Localized edema: Secondary | ICD-10-CM | POA: Diagnosis not present

## 2021-12-03 ENCOUNTER — Other Ambulatory Visit (HOSPITAL_COMMUNITY): Payer: Self-pay

## 2021-12-03 ENCOUNTER — Encounter: Payer: Medicare Other | Admitting: Physical Therapy

## 2021-12-03 ENCOUNTER — Encounter: Payer: Self-pay | Admitting: Physical Therapy

## 2021-12-03 ENCOUNTER — Ambulatory Visit
Admission: EM | Admit: 2021-12-03 | Discharge: 2021-12-03 | Disposition: A | Discharge status: Home / Self Care | Payer: Medicare Other | Attending: Urgent Care | Admitting: Urgent Care

## 2021-12-03 ENCOUNTER — Ambulatory Visit (INDEPENDENT_AMBULATORY_CARE_PROVIDER_SITE_OTHER): Payer: Medicare Other | Admitting: Physical Therapy

## 2021-12-03 DIAGNOSIS — N3001 Acute cystitis with hematuria: Secondary | ICD-10-CM

## 2021-12-03 DIAGNOSIS — R600 Localized edema: Secondary | ICD-10-CM | POA: Diagnosis not present

## 2021-12-03 DIAGNOSIS — M6281 Muscle weakness (generalized): Secondary | ICD-10-CM

## 2021-12-03 DIAGNOSIS — M25511 Pain in right shoulder: Secondary | ICD-10-CM

## 2021-12-03 DIAGNOSIS — E2740 Unspecified adrenocortical insufficiency: Secondary | ICD-10-CM | POA: Diagnosis not present

## 2021-12-03 DIAGNOSIS — R300 Dysuria: Secondary | ICD-10-CM | POA: Diagnosis present

## 2021-12-03 LAB — POCT URINALYSIS DIP (MANUAL ENTRY)
Bilirubin, UA: NEGATIVE
Glucose, UA: NEGATIVE mg/dL
Ketones, POC UA: NEGATIVE mg/dL
Nitrite, UA: NEGATIVE
Protein Ur, POC: 30 mg/dL — AB
Spec Grav, UA: 1.015 (ref 1.010–1.025)
Urobilinogen, UA: 0.2 E.U./dL
pH, UA: 5.5 (ref 5.0–8.0)

## 2021-12-03 MED ORDER — CEPHALEXIN 500 MG PO CAPS
500.0000 mg | ORAL_CAPSULE | Freq: Two times a day (BID) | ORAL | 0 refills | Status: DC
  Filled 2021-12-03: qty 10, 5d supply, fill #0

## 2021-12-03 MED ORDER — POTASSIUM CHLORIDE 40 MEQ/15ML (20%) PO SOLN
7.5000 mL | Freq: Three times a day (TID) | ORAL | 0 refills | Status: DC
  Filled 2021-12-03: qty 2025, 90d supply, fill #0

## 2021-12-03 NOTE — ED Provider Notes (Signed)
[facility]  Note:  This document was prepared using Conservation officer, historic buildings and may include unintentional dictation errors.  MRN: [id number] DOB: [birth year]  Subjective:   [name] is a 61 y.o. female presenting for 1 day history of acute onset urinary frequency, urinary urgency and dysuria.  Patient has a history of adrenal insufficiency and has to drink Gatorade daily. Does not drink as much water due to her chronic condition.  Has a history of UTIs and this feels very similar.  No current facility-administered medications for this encounter.  Current Outpatient Medications:    acetaminophen (TYLENOL) 500 MG tablet, Take 500 mg by mouth 2 (two) times daily., Disp: , Rfl:    Adalimumab 40 MG/0.8ML PNKT, Inject 40 mg into the skin every Saturday., Disp: , Rfl:    Aloe-Sodium Chloride (AYR SALINE NASAL GEL NA), Place 1 spray into the nose daily as needed (congestion)., Disp: , Rfl:    aspirin EC 325 MG tablet, Take 325 mg by mouth 2 (two) times daily. , Disp: , Rfl:    atorvastatin (LIPITOR) 20 MG tablet, Take 1 tablet (20 mg total) by mouth once daily, Disp: 30 tablet, Rfl: 11   azelastine (ASTELIN) 0.1 % nasal spray, Place 2 sprays into both nostrils once daily, Disp: 30 mL, Rfl: 3   benzoyl peroxide 10 % LIQD, Apply to shoulder / surgical area twice daily for 2 days before surgery. Do not use day of surgery. Ok to substitute with Benzoyl Peroxide 10% cream or lotion if wash is unavailable., Disp: 227 g, Rfl: 0   Calcium Carb-Cholecalciferol (CALCIUM/VITAMIN D) 600-400 MG-UNIT TABS, Take 1 tablet by mouth 3 (three) times daily., Disp: , Rfl:    colchicine 0.6 MG tablet, Take 1 tablet by mouth 2 times daily, Disp: 180 tablet, Rfl: 1   dexamethasone (DECADRON) 4 MG/ML injection, Inject 1 mL (4 mg total) into the muscle once as needed (for adrenal crisis) for up to 1 dose., Disp: 1 mL, Rfl: 1   diphenhydrAMINE (BENADRYL) 25 mg capsule, Take  25 mg by mouth at bedtime., Disp: , Rfl:    docusate sodium (COLACE) 100 MG capsule, Take 1 capsule (100 mg total) by mouth 2 (two) times daily for 10 days Take after surgery while also taking narcotic medications, Disp: 20 capsule, Rfl: 0   DULoxetine (CYMBALTA) 60 MG capsule, Take 60 mg by mouth daily., Disp: , Rfl:    DULoxetine (CYMBALTA) 60 MG capsule, Take 1 capsule (60 mg total) by mouth once daily, Disp: 90 capsule, Rfl: 3   fluconazole (DIFLUCAN) 200 MG tablet, Take 1 tablet (200 mg total) by mouth daily for 14 days, Disp: 14 tablet, Rfl: 0   fluocinonide cream (LIDEX) 0.05 %, Apply 1 application topically daily as needed (rash). , Disp: , Rfl:    fluticasone (FLONASE) 50 MCG/ACT nasal spray, Place 1 spray into the nose daily as needed for allergies. , Disp: , Rfl:    fluticasone furoate-vilanterol (BREO ELLIPTA) 200-25 MCG/ACT AEPB, Inhale 1 inhalation into the lungs once daily, Disp: 60 each, Rfl: 6   Fluticasone-Umeclidin-Vilant (TRELEGY ELLIPTA) 100-62.5-25 MCG/ACT AEPB, Inhale 1 inhalation into the lungs once daily, Disp: 60 each, Rfl: 5   folic acid (FOLVITE) 1 MG tablet, Take 2 tablets by mouth once daily in the afternoon, Disp: 180 tablet, Rfl: 3   gabapentin (NEURONTIN) 100 MG capsule, TAKE 1 CAPSULE BY MOUTH 3 TIMES DAILY, Disp: 270 capsule, Rfl: 3   lansoprazole (PREVACID) 30  MG capsule, Take 30 mg by mouth daily., Disp: , Rfl:    lansoprazole (PREVACID) 30 MG capsule, Take 1 capsule  by mouth once daily, Disp: 90 capsule, Rfl: 3   leflunomide (ARAVA) 20 MG tablet, Take 1 tablet by mouth once daily, Disp: 90 tablet, Rfl: 3   loperamide (IMODIUM) 2 MG capsule, Take 1 capsule (2 mg total) by mouth as needed for diarrhea or loose stools. (Patient taking differently: Take 2-4 mg by mouth See admin instructions. Take 2 capsules (4mg ) at onset of diarrhea, and 1 capsule (2mg ) every 4 hours after until symptoms subside), Disp: 30 capsule, Rfl: 0   Magnesium 500 MG CAPS, Take 500 mg by  mouth 3 (three) times daily., Disp: , Rfl:    Multiple Vitamins-Minerals (BARIATRIC MULTIVITAMINS/IRON PO), Take 1 tablet by mouth daily., Disp: , Rfl:    mupirocin ointment (BACTROBAN) 2 %, Apply to affected areas 3  times daily for 7 days, Disp: 22 g, Rfl: 0   NEEDLE, DISP, 18 G (BD HYPODERMIC NEEDLE) 18G X 1" MISC, Use to withdraw dexamethasone from vial, Disp: 30 each, Rfl: 0   SYRINGE-NEEDLE, DISP, 3 ML 25G X 1" 3 ML MISC, Use to inject dexamethasone into the muscle., Disp: 30 each, Rfl: 0   nystatin-hydrocortisone-diphenhydrAMINE-lidocaine, Swish and swallow 5 to 10 MLs by mouth every 6 hours as needed, Disp: 120 mL, Rfl: 3   ondansetron (ZOFRAN) 4 MG tablet, Take 1 tablet (4 mg total) by mouth every 8 (eight) hours as needed for Nausea for up to 7 days, Disp: 20 tablet, Rfl: 0   Oral Electrolytes (THERMOTABS PO), Take 2 tablets by mouth daily as needed (hydration). May take an additional 2 more 2 tablet doses as needed for physical activity or heat, Disp: , Rfl:    OVER THE COUNTER MEDICATION, Place 1 drop into both eyes as needed (dry eyes). Crocodile tears otc eye drops, Disp: , Rfl:    Pancrelipase, Lip-Prot-Amyl, 24000-76000 units CPEP, Take 3-5 capsules by mouth See admin instructions. Take 5 capsules by mouth with meals and 3 capsules with snacks, Disp: , Rfl:    Potassium Chloride 40 MEQ/15ML (20%) SOLN, Take 7.5 mLs by mouth 3 (three) times daily for 90 days., Disp: 2025 mL, Rfl: 0   potassium chloride SA (KLOR-CON M) 20 MEQ tablet, TAKE 1 TABLET BY MOUTH THREE TIMES DAILY, Disp: 270 tablet, Rfl: 3   predniSONE (DELTASONE) 10 MG tablet, Take 1 tablet (10 mg total) by mouth daily., Disp: , Rfl:    predniSONE (DELTASONE) 10 MG tablet, Take 1 tablet by mouth once daily, Disp: 90 tablet, Rfl: 3   predniSONE (DELTASONE) 2.5 MG tablet, Take 1 tablet by mouth once daily as needed (adrenal insufficiency) EMERGENCY DOSE -due to renal insufficiency, Disp: 60 tablet, Rfl: 1   propranolol (INDERAL)  80 MG tablet, TAKE 1 TABLET BY MOUTH TWICE DAILY (REPLACES METOPROLOL), Disp: 180 tablet, Rfl: 3   TUBERCULIN SYR 1CC/27GX1/2" (B-D TB SYRINGE 1CC/27GX1/2") 27G X 1/2" 1 ML MISC, Use to inject dexamethasone as needed, Disp: 30 each, Rfl: 0   Vitamin A 2400 MCG (8000 UT) CAPS, Take 1 capsule (8,000 Units total) by mouth once daily, Disp: 30 capsule, Rfl: 2   Allergies  Allergen Reactions   Celebrex [Celecoxib] Other (See Comments)    Bleeding/bruising including bleeding for multiple days This is a reaction d/t sulfa product   Fentanyl Nausea And Vomiting    Had mild heart attack after last surgery along with vomiting severely. Does  NOT want fentanyl at all.  Versed and valium are okay   Latex Shortness Of Breath and Dermatitis   Sulfa Drugs Cross Reactors Other (See Comments)    Eyes turn red, renal failure, stomach problems (vomiting, diarrhea), vasculature collapses, migraine like symptoms   Sulfites Other (See Comments)    Eyes turn red, renal failure, stomach problems (vomiting, diarrhea), vasculature collapses, migraine like symptoms   Simponi [Golimumab] Swelling    Simponi Aria- Headache, blurred vision. (similar to remicade, but uses human dna)   Spiractazide [Hydrochlorothiazide W-Spironolactone] Other (See Comments)    Made blood pressure rise   Adhesive [Tape] Other (See Comments)    Pulls skin right off.  PAPER TAPE is okay. tegaderm is also okay   Codeine Itching and Nausea And Vomiting    Tolerates hydrocodone    Epinephrine Other (See Comments)    Tachycardia (this is a normal side effect of this medication)   Erythromycin Itching and Rash    Has tolerated azithromycin   Loratadine Itching   Morphine And Related Itching and Rash    Past Medical History:  Diagnosis Date   Acid reflux    Adrenal insufficiency (HCC)    Anxiety    when she gets sick she gets anxious   Arrhythmia    Arthritis    inflammatory arthritis   Asthma    Behcet's syndrome (HCC)     Chronic back pain    Chronic neck pain    Colitis    Connective tissue disorder (HCC)    Depression    Dysrhythmia    POTS syndrome   Endometritis    Foot drop    due to muscle weakness from immunosuppressants   GERD (gastroesophageal reflux disease)    Gluten intolerance    Heart murmur    IBS (irritable bowel syndrome)    Myocardial infarction (HCC)    per pt, mild mi due to immunosuppressant therapy   Nasal ulcer    gets occasionally from medications   Osteoarthritis    Osteoporosis    T score reported -2.7   Pneumonia    20 years ago   PONV (postoperative nausea and vomiting)    also low blood pressure at times. do not use lactated ringers. must have sugar with saline d/t adrenal insufficiency   Refusal of blood transfusions as patient is Jehovah's Witness    Renal cyst 2019   Sickle cell trait (HCC)    Stroke (HCC) 2014   small peripheral stroke. Mononeuritis multiplex   Subjective visual disturbance 07/14/2012   Superficial phlebitis and thrombophlebitis of both lower extremities 2019   Tear of lateral meniscus of right knee, current    Tear of lateral & medial meniscus of right knee   Tinnitus    left ear sees Dr Ezzard Standing (ENT)   Uveitis    Visual changes 2019   left eye vision only with distance, right eye has floater. vascular changes     Past Surgical History:  Procedure Laterality Date   ANTERIOR CERVICAL DECOMP/DISCECTOMY FUSION N/A 08/22/2013   Procedure: ANTERIOR CERVICAL DECOMPRESSION/DISCECTOMY FUSION 2 LEVELS;  Surgeon: Karn Cassis, MD;  Location: [facility];  Service: Neurosurgery;  Laterality: N/A;  C4-5 C5-6 Anterior cervical decompression/diskectomy/fusion   ANTERIOR FUSION CERVICAL SPINE  08/22/2013   c4 5  6          AXILLARY SURGERY     BACK SURGERY     BREAST EXCISIONAL BIOPSY Right 1986   BREAST SURGERY  Cysts excised   CARDIAC CATHETERIZATION  09/18/2013   CESAREAN SECTION  03/2001   x 1   CHOLECYSTECTOMY  12/2004   COLONOSCOPY   03/2013   DILATION AND CURETTAGE OF UTERUS     EXCISION OF BREAST LESION Right 08/18/2017   Procedure: EXCISION OF RIGHT AXILLARY SOFT TISSUE MASS;  Surgeon: Carolan Shiver, MD;  Location: [facility];  Service: General;  Laterality: Right;   FEMUR IM NAIL Left 04/02/2020   Procedure: INTRAMEDULLARY (IM) NAIL FEMORAL;  Surgeon: Sheral Apley, MD;  Location: [facility];  Service: Orthopedics;  Laterality: Left;   HAND SURGERY Right 07/2001   dupytren   HYSTEROSCOPY     IR SI JOINT INJ / ARTH LEFT W/IMAG GUIDE  2018   si fusion , left    IR SI JOINT INJ / ARTH RIGHT W/IMAG GUIDE Right 2018   JOINT REPLACEMENT Right 11/2016   partial knee replacement   KNEE ARTHROSCOPY WITH LATERAL MENISECTOMY Right 12/12/2012   Procedure: KNEE ARTHROSCOPY WITH LATERAL MENISECTOMY;  Surgeon: Nilda Simmer, MD;  Location: [facility];  Service: Orthopedics;  Laterality: Right;   KNEE SURGERY Left 05/1998   LEFT HEART CATHETERIZATION WITH CORONARY ANGIOGRAM N/A 09/18/2013   Procedure: LEFT HEART CATHETERIZATION WITH CORONARY ANGIOGRAM;  Surgeon: Micheline Chapman, MD;  Location: [facility];  Service: Cardiovascular;  Laterality: N/A;   LUMBAR DISC SURGERY     OOPHORECTOMY     BSO   PELVIC LAPAROSCOPY     DL   SPINE SURGERY     SUBMANDIBULAR MASS EXCISION     THYROID CYST EXCISION  1983   thyroglossal duct cyst   VAGINAL HYSTERECTOMY  07/2007   LAVH BSO    Family History  Problem Relation Age of Onset   Diabetes Mother    Breast cancer Mother        Age 75's   Stroke Mother    Depression Mother    Hyperlipidemia Mother    Gastric cancer Father    Diabetes Sister    Hypertension Sister    Breast cancer Sister        Age 39   Hyperlipidemia Sister    Thyroid disease Sister    CAD Maternal Grandfather    Polymyositis Maternal Aunt    Myasthenia gravis Maternal Aunt    Lupus Cousin    Rheum arthritis Cousin    Skin cancer Maternal Aunt    Prostate cancer Paternal Uncle      Social History   Tobacco Use   Smoking status: Never   Smokeless tobacco: Never  Vaping Use   Vaping Use: Never used  Substance Use Topics   Alcohol use: No    Alcohol/week: 0.0 standard drinks of alcohol   Drug use: No    ROS   Objective:   Vitals: BP 116/71 (BP Location: Left Arm)   Pulse 73   Temp 98.4 F (36.9 C) (Oral)   Resp 20   SpO2 98%   Physical Exam Constitutional:      General: She is not in acute distress.    Appearance: Normal appearance. She is well-developed. She is not ill-appearing, toxic-appearing or diaphoretic.  HENT:     Head: Normocephalic and atraumatic.     Nose: Nose normal.     Mouth/Throat:     Mouth: Mucous membranes are moist.  Eyes:     General: No scleral icterus.       Right eye: No discharge.  Left eye: No discharge.     Extraocular Movements: Extraocular movements intact.     Conjunctiva/sclera: Conjunctivae normal.  Cardiovascular:     Rate and Rhythm: Normal rate.  Pulmonary:     Effort: Pulmonary effort is normal.  Abdominal:     General: Bowel sounds are normal. There is no distension.     Palpations: Abdomen is soft. There is no mass.     Tenderness: There is no abdominal tenderness. There is no right CVA tenderness, left CVA tenderness, guarding or rebound.  Skin:    General: Skin is warm and dry.  Neurological:     General: No focal deficit present.     Mental Status: She is alert and oriented to person, place, and time.  Psychiatric:        Mood and Affect: Mood normal.        Behavior: Behavior normal.        Thought Content: Thought content normal.        Judgment: Judgment normal.     Results for orders placed or performed during the hospital encounter of 12/03/21 (from the past 24 hour(s))  POCT urinalysis dipstick     Status: Abnormal   Collection Time: 12/03/21  2:44 PM  Result Value Ref Range   Color, UA yellow yellow   Clarity, UA cloudy (A) clear   Glucose, UA negative negative mg/dL    Bilirubin, UA negative negative   Ketones, POC UA negative negative mg/dL   Spec Grav, UA 1.610 9.604 - 1.025   Blood, UA moderate (A) negative   pH, UA 5.5 5.0 - 8.0   Protein Ur, POC =30 (A) negative mg/dL   Urobilinogen, UA 0.2 0.2 or 1.0 E.U./dL   Nitrite, UA Negative Negative   Leukocytes, UA Moderate (2+) (A) Negative    Assessment and Plan :   PDMP not reviewed this encounter.  1. Acute cystitis with hematuria   2. Dysuria   3. Adrenal insufficiency (HCC)     Start Keflex to cover for acute cystitis, urine culture pending.  Counseled patient on potential for adverse effects with medications prescribed/recommended today, ER and return-to-clinic precautions discussed, patient verbalized understanding.    Wallis Bamberg, PA-C 12/03/21 1511

## 2021-12-03 NOTE — ED Triage Notes (Signed)
Pt c/o dysuria, freq, urgency started yesterday-NAD-steady gait

## 2021-12-03 NOTE — Therapy (Signed)
OUTPATIENT PHYSICAL THERAPY TREATMENT   Patient Name: [name] MRN: [id number] DOB:May 21, 1960, 61 y.o., female Today's Date: 12/03/2021  END OF SESSION:  PT End of Session - 12/03/21 1259     Visit Number 2    Number of Visits 20    Date for PT Re-Evaluation 02/17/22    Authorization Type MCR    PT Start Time 1300    PT Stop Time 1340    PT Time Calculation (min) 40 min    Activity Tolerance Patient tolerated treatment well    Behavior During Therapy WFL for tasks assessed/performed              Past Medical History:  Diagnosis Date   Acid reflux    Adrenal insufficiency ([place])    Anxiety    when she gets sick she gets anxious   Arrhythmia    Arthritis    inflammatory arthritis   Asthma    Behcet's syndrome (HCC)    Chronic back pain    Chronic neck pain    Colitis    Connective tissue disorder (HCC)    Depression    Dysrhythmia    POTS syndrome   Endometritis    Foot drop    due to muscle weakness from immunosuppressants   GERD (gastroesophageal reflux disease)    Gluten intolerance    Heart murmur    IBS (irritable bowel syndrome)    Myocardial infarction ([place])    per pt, mild mi due to immunosuppressant therapy   Nasal ulcer    gets occasionally from medications   Osteoarthritis    Osteoporosis    T score reported -2.7   Pneumonia    20 years ago   PONV (postoperative nausea and vomiting)    also low blood pressure at times. do not use lactated ringers. must have sugar with saline d/t adrenal insufficiency   Refusal of blood transfusions as patient is Jehovah's Witness    Renal cyst 2019   Sickle cell trait ([place])    Stroke ([place]) 2014   small peripheral stroke. Mononeuritis multiplex   Subjective visual disturbance 07/14/2012   Superficial phlebitis and thrombophlebitis of both lower extremities 2019   Tear of lateral meniscus of right knee, current    Tear of lateral & medial meniscus of right knee   Tinnitus    left ear sees  Dr Lucia Gaskins (ENT)   Uveitis    Visual changes 2019   left eye vision only with distance, right eye has floater. vascular changes   Past Surgical History:  Procedure Laterality Date   ANTERIOR CERVICAL DECOMP/DISCECTOMY FUSION N/A 08/22/2013   Procedure: ANTERIOR CERVICAL DECOMPRESSION/DISCECTOMY FUSION 2 LEVELS;  Surgeon: Floyce Stakes, MD;  Location: [facility];  Service: Neurosurgery;  Laterality: N/A;  C4-5 C5-6 Anterior cervical decompression/diskectomy/fusion   ANTERIOR FUSION CERVICAL SPINE  08/22/2013   c4 5  6          AXILLARY SURGERY     BACK SURGERY     BREAST EXCISIONAL BIOPSY Right 1986   BREAST SURGERY     Cysts excised   CARDIAC CATHETERIZATION  09/18/2013   CESAREAN SECTION  03/2001   x 1   CHOLECYSTECTOMY  12/2004   COLONOSCOPY  03/2013   DILATION AND CURETTAGE OF UTERUS     EXCISION OF BREAST LESION Right 08/18/2017   Procedure: EXCISION OF RIGHT AXILLARY SOFT TISSUE MASS;  Surgeon: Herbert Pun, MD;  Location: [facility];  Service: General;  Laterality: Right;  FEMUR IM NAIL Left 04/02/2020   Procedure: INTRAMEDULLARY (IM) NAIL FEMORAL;  Surgeon: Renette Butters, MD;  Location: [facility];  Service: Orthopedics;  Laterality: Left;   HAND SURGERY Right 07/2001   dupytren   HYSTEROSCOPY     IR SI JOINT INJ / ARTH LEFT W/IMAG GUIDE  2018   si fusion , left    IR SI JOINT INJ / ARTH RIGHT W/IMAG GUIDE Right 2018   JOINT REPLACEMENT Right 11/2016   partial knee replacement   KNEE ARTHROSCOPY WITH LATERAL MENISECTOMY Right 12/12/2012   Procedure: KNEE ARTHROSCOPY WITH LATERAL MENISECTOMY;  Surgeon: Lorn Junes, MD;  Location: [facility];  Service: Orthopedics;  Laterality: Right;   KNEE SURGERY Left 05/1998   LEFT HEART CATHETERIZATION WITH CORONARY ANGIOGRAM N/A 09/18/2013   Procedure: LEFT HEART CATHETERIZATION WITH CORONARY ANGIOGRAM;  Surgeon: Blane Ohara, MD;  Location: [facility] C[facility];  Service: Cardiovascular;  Laterality: N/A;   LUMBAR  DISC SURGERY     OOPHORECTOMY     BSO   PELVIC LAPAROSCOPY     DL   SPINE SURGERY     SUBMANDIBULAR MASS EXCISION     THYROID CYST EXCISION  1983   thyroglossal duct cyst   VAGINAL HYSTERECTOMY  07/2007   LAVH BSO   Patient Active Problem List   Diagnosis Date Noted   Pancreatic insufficiency 04/01/2020   Tremor 08/04/2019   Abnormal cardiovascular stress test 08/04/2019   Hypokalemia 02/15/2019   Chest pain 02/15/2019   Iatrogenic adrenal insufficiency ([place]) 01/15/2018   Orthostatic hypotension 12/07/2013   Adrenal insufficiency ([place]) 09/25/2013   Palpitations 09/16/2013   Cervical stenosis of spinal canal 08/22/2013   Lumbar degenerative disc disease 12/30/2012   Tear of lateral meniscus of right knee, current    Osteopenia    Lumbar back pain 09/13/2012   Subjective visual disturbance 07/14/2012   Endometriosis    Endometrial polyp    IBS (irritable bowel syndrome)    Acid reflux    Colitis    Behcet's syndrome (HCC)    Glucose intolerance (impaired glucose tolerance) 12/19/2010   Behcet's disease ([place]) 01/06/2004    PCP: Evalee Mutton, MD   REFERRING PROVIDER: Ewing, Sharyn Blitz   REFERRING DIAG: M19.011 (ICD-10-CM) - Primary osteoarthritis, right shoulder   THERAPY DIAG:  Acute pain of right shoulder  Muscle weakness (generalized)  Localized edema  Rationale for Evaluation and Treatment: Rehabilitation  ONSET DATE: Rt TSA with biceps tenodesis 11/17/21  SUBJECTIVE:  SUBJECTIVE STATEMENT: She reports she had good follow up from MD, after this some pain from the vibrations of road driving back from [place] to her PT visit but otherwise her shoulder feels like it is doing well.   PERTINENT HISTORY: Rt TSA with biceps tenodesis 11/17/21  PAIN:  Are you having pain? Yes:  NPRS scale: 2/10 Pain location: Rt shoulder around incision Pain description: ache, no N/T reported Aggravating factors: movement of her Rt arm Relieving factors: rest, ice  PRECAUTIONS: Shoulder, see protocol in clinic folder  WEIGHT BEARING RESTRICTIONS: Non weight bearing on Rt shoulder until week 6  FALLS:  Has patient fallen in last 6 months? Yes. Number of falls 1   OCCUPATION:   PLOF: Independent with basic ADLs  PATIENT GOALS: improve use of her Rt arm  NEXT MD VISIT:   OBJECTIVE:   DIAGNOSTIC FINDINGS:    PATIENT SURVEYS:  FOTO 4% functional score at eval, goal is 44%  COGNITION: Overall cognitive status: Within functional limits for tasks assessed     SENSATION: WFL  POSTURE:   UPPER EXTREMITY ROM:   Passive ROM Right eval Left eval  Shoulder flexion 85   Shoulder extension    Shoulder abduction 60   Shoulder adduction    Shoulder internal rotation To belly Arm by side   Shoulder external rotation 14 Arm by side   Elbow flexion    Elbow extension    Wrist flexion    Wrist extension    Wrist ulnar deviation    Wrist radial deviation    Wrist pronation    Wrist supination    (Blank rows = not tested)  UPPER EXTREMITY MMT:  MMT  Right eval Left eval  Shoulder flexion    Shoulder extension    Shoulder abduction    Shoulder adduction    Shoulder internal rotation    Shoulder external rotation    Middle trapezius    Lower trapezius    Elbow flexion    Elbow extension    Wrist flexion    Wrist extension    Wrist ulnar deviation    Wrist radial deviation    Wrist pronation    Wrist supination    Grip strength (lbs)    (Blank rows = not tested)  SHOULDER SPECIAL TESTS:   JOINT MOBILITY TESTING:  Not tested today post op, Pt reports she has hypomobility at baseline  PALPATION:     TODAY'S TREATMENT:  12/03/21 -Manual therapy for Rt shoulder PROM gentle each direction per post op ROM limits -Pendulums all planes X 15  each -Gripping red digigrip 2X15 -Table slides X 10 each for AAROM flexion, abd, ER -Scapular retractions X 15 -Posterior and anterior shoulder rolls  X15 each -Rt elbow AROM X 15 -Pulleys 2 min flexion -Pulleys 2 min abduction  -Gentle submax isometrics 3-5 sec for Rt shoulder  X10 for flexion, abd, IR, ER      PATIENT EDUCATION: Education details: HEP, PT plan of care, post op precautions review Person educated: Patient Education method: Explanation, Demonstration, Verbal cues, and Handouts Education comprehension: verbalized understanding and needs further education   HOME EXERCISE PROGRAM: Access Code: [email or web address] URL: [email or web address] Date: 11/25/2021 Prepared by: Elsie Ra  Exercises - Circular Shoulder Pendulum with Table Support  - 3 x daily - 7 x weekly - 1-2 sets - 10 reps - Seated Shoulder Abduction Towel Slide at Table Top  - 2 x daily - 6 x weekly - 1-2 sets - 10 reps -  5 sec hold - Seated Shoulder Flexion Towel Slide at Table Top  - 2 x daily - 6 x weekly - 1-2 sets - 10 reps - 5 sec hold - Seated Shoulder External Rotation PROM on Table  - 2-3 x daily - 6 x weekly - 1-2 sets - 10 reps - 5 sec hold - Seated Gripping Towel  - 2 x daily - 6 x weekly - 1 sets - 20 reps - Elbow AROM Flexion & Extension Supinated Forearm  - 2 x daily - 6 x weekly - 1-2 sets - 10 reps - Seated Scapular Retraction  - 2 x daily - 6 x weekly - 1-2 sets - 10 reps  ASSESSMENT:  CLINICAL IMPRESSION: She is doing well post op with her ROM and appeared to have good MD follow up visit. Added submax gentle isometrics today to begin to work on deltoid and RTC activation.   OBJECTIVE IMPAIRMENTS: decreased activity tolerance, decreased shoulder mobility, decreased ROM, decreased strength, impaired flexibility, impaired UE use, postural dysfunction, and pain.  ACTIVITY LIMITATIONS: reaching, lifting, carry,  cleaning, driving, and or occupation  PERSONAL FACTORS: PMH  significant for Behcet's disease (on Humira), RA, secondary adrenal insufficiency, OSA not on CPAP, mild reactive airway disease, pituitary adenoma, IDA, IBS, chronic diarrhea, chronic GERD, fat malabsorption and steroid induced osteoporosis.  also affecting patient's functional outcome.  REHAB POTENTIAL: Good  CLINICAL DECISION MAKING: Stable/uncomplicated  EVALUATION COMPLEXITY: Low    GOALS: Short term PT Goals Target date: 12/23/2021 Pt will be I and compliant with HEP. Baseline:  Goal status: MET Pt will decrease pain by 25% overall Baseline: Goal status: ongoing  Long term PT goals Target date: 02/17/2022 Pt will improve Rt shoulder AROM to [facility] to improve functional reaching Baseline: Goal status: New Pt will improve  Rt shoulder strength to at least 4+/5 MMT to improve functional strength Baseline: Goal status: New Pt will improve FOTO to at least 44% functional to show improved function Baseline: Goal status: New Pt will reduce pain to overall less than 3/10 with usual activity and work activity. Baseline: Goal status: New  PLAN: PT FREQUENCY: 1-3 times per week   PT DURATION: 12 weeks  PLANNED INTERVENTIONS (unless contraindicated): aquatic PT, Canalith repositioning, cryotherapy, Electrical stimulation, Iontophoresis with 4 mg/ml dexamethasome, Moist heat, traction, Ultrasound, gait training, Therapeutic exercise, balance training, neuromuscular re-education, patient/family education, prosthetic training, manual techniques, passive ROM, dry needling, taping, vasopnuematic device, vestibular, spinal manipulations, joint manipulations  PLAN FOR NEXT SESSION: protocol is in patient folder summary is as follows: Week 1-6 (until 12/29/21) -sling for 2 weeks then may remove at home, continue to wear in community up to week 6 -avoid hand behind back -avoid >30 deg ER -No shoulder active elevation -No weight bearing on shoulder Week 6-12 (12/29/21 to  02/09/21) -discontinue sling -motion recovery IR without force -Begin active shoulder elevation -may begin ER at 90 deg abd to 60 deg Weight bearing on UE allowed

## 2021-12-03 NOTE — Discharge Instructions (Addendum)
Please start Keflex to address an urinary tract infection. Make sure you hydrate very well with plain water and a quantity of 64 ounces of water a day mixed with Gatorade.  Please limit drinks that are considered urinary irritants such as soda, sweet tea, coffee, energy drinks, alcohol.  These can worsen your urinary and genital symptoms but also be the source of them.  I will let you know about your urine culture results through MyChart to see if we need to prescribe or change your antibiotics based off of those results.

## 2021-12-05 ENCOUNTER — Other Ambulatory Visit (HOSPITAL_COMMUNITY): Payer: Self-pay

## 2021-12-05 LAB — URINE CULTURE: Culture: >=100000 — AB

## 2021-12-05 MED ORDER — CEPHALEXIN 500 MG PO CAPS
500.0000 mg | ORAL_CAPSULE | Freq: Three times a day (TID) | ORAL | 0 refills | Status: AC
  Filled 2021-12-05: qty 12, 4d supply, fill #0

## 2021-12-06 ENCOUNTER — Other Ambulatory Visit (HOSPITAL_COMMUNITY): Payer: Self-pay

## 2021-12-09 ENCOUNTER — Encounter: Payer: Medicare Other | Admitting: Physical Therapy

## 2021-12-13 ENCOUNTER — Other Ambulatory Visit (HOSPITAL_COMMUNITY): Payer: Self-pay

## 2021-12-15 ENCOUNTER — Other Ambulatory Visit (HOSPITAL_COMMUNITY): Payer: Self-pay

## 2021-12-16 ENCOUNTER — Other Ambulatory Visit (HOSPITAL_COMMUNITY): Payer: Self-pay

## 2021-12-16 ENCOUNTER — Ambulatory Visit
Admission: EM | Admit: 2021-12-16 | Discharge: 2021-12-16 | Disposition: A | Discharge status: Home / Self Care | Payer: Medicare Other | Attending: Internal Medicine | Admitting: Internal Medicine

## 2021-12-16 DIAGNOSIS — R300 Dysuria: Secondary | ICD-10-CM | POA: Diagnosis not present

## 2021-12-16 LAB — POCT URINALYSIS DIP (MANUAL ENTRY)
Bilirubin, UA: NEGATIVE
Blood, UA: NEGATIVE
Glucose, UA: NEGATIVE mg/dL
Ketones, POC UA: NEGATIVE mg/dL
Leukocytes, UA: NEGATIVE
Nitrite, UA: NEGATIVE
Protein Ur, POC: NEGATIVE mg/dL
Spec Grav, UA: 1.015 (ref 1.010–1.025)
Urobilinogen, UA: 0.2 E.U./dL
pH, UA: 7 (ref 5.0–8.0)

## 2021-12-16 MED ORDER — FLUCONAZOLE 150 MG PO TABS
150.0000 mg | ORAL_TABLET | ORAL | 0 refills | Status: DC
  Filled 2021-12-16: qty 2, 6d supply, fill #0

## 2021-12-16 NOTE — ED Triage Notes (Signed)
Pt returns for ongoing dysuria and odor in urine. Completed Keflex then was given an extension of Keflex by PCP.  Initially improved but about three days after abx had return of continued urgency, dysuria and cloudy/malodorous urine.  Reports lower abdominal pain after urination. No known fever.

## 2021-12-16 NOTE — Discharge Instructions (Signed)
I am suspicious of vaginal yeast infection.  Your vaginal swab is pending.  I am treating you with Diflucan.  Please follow-up if symptoms persist or worsen.

## 2021-12-16 NOTE — ED Provider Notes (Signed)
[facility]    CSN: [id number] Arrival date & time: 12/16/21  1414      History   Chief Complaint Chief Complaint  Patient presents with   Dysuria    HPI [name] is a 61 y.o. female.   Patient presents with dysuria and mild lower abdominal discomfort that has been intermittent over the past 2 weeks.  Patient was originally seen on 11/30/2021 and treated for urinary tract infection.  Patient was treated with cephalexin antibiotic with improvement in symptoms.  Urine culture was positive for PROTEUS MIRABILIS. Then, symptoms returned so she contacted her PCP.  PCP extended to 12 more doses of cephalexin by telephone call.  Symptoms improved with this as well but then returned approximately 2 days ago.  She denies vaginal discharge, hematuria, back pain, fever, abnormal vaginal bleeding.  Patient denies any concern for STD.  Patient does report that she has had yeast infections in the past without vaginal discharge and states this feels similar.   Dysuria   Past Medical History:  Diagnosis Date   Acid reflux    Adrenal insufficiency (HCC)    Anxiety    when she gets sick she gets anxious   Arrhythmia    Arthritis    inflammatory arthritis   Asthma    Behcet's syndrome (HCC)    Chronic back pain    Chronic neck pain    Colitis    Connective tissue disorder (HCC)    Depression    Dysrhythmia    POTS syndrome   Endometritis    Foot drop    due to muscle weakness from immunosuppressants   GERD (gastroesophageal reflux disease)    Gluten intolerance    Heart murmur    IBS (irritable bowel syndrome)    Myocardial infarction (HCC)    per pt, mild mi due to immunosuppressant therapy   Nasal ulcer    gets occasionally from medications   Osteoarthritis    Osteoporosis    T score reported -2.7   Pneumonia    20 years ago   PONV (postoperative nausea and vomiting)    also low blood pressure at times. do not use lactated ringers. must have  sugar with saline d/t adrenal insufficiency   Refusal of blood transfusions as patient is Jehovah's Witness    Renal cyst 2019   Sickle cell trait (HCC)    Stroke (HCC) 2014   small peripheral stroke. Mononeuritis multiplex   Subjective visual disturbance 07/14/2012   Superficial phlebitis and thrombophlebitis of both lower extremities 2019   Tear of lateral meniscus of right knee, current    Tear of lateral & medial meniscus of right knee   Tinnitus    left ear sees Dr Ezzard Standing (ENT)   Uveitis    Visual changes 2019   left eye vision only with distance, right eye has floater. vascular changes    Patient Active Problem List   Diagnosis Date Noted   Pancreatic insufficiency 04/01/2020   Tremor 08/04/2019   Abnormal cardiovascular stress test 08/04/2019   Hypokalemia 02/15/2019   Chest pain 02/15/2019   Iatrogenic adrenal insufficiency (HCC) 01/15/2018   Orthostatic hypotension 12/07/2013   Adrenal insufficiency (HCC) 09/25/2013   Palpitations 09/16/2013   Cervical stenosis of spinal canal 08/22/2013   Lumbar degenerative disc disease 12/30/2012   Tear of lateral meniscus of right knee, current    Osteopenia    Lumbar back pain 09/13/2012   Subjective visual disturbance 07/14/2012   Endometriosis  Endometrial polyp    IBS (irritable bowel syndrome)    Acid reflux    Colitis    Behcet's syndrome (HCC)    Glucose intolerance (impaired glucose tolerance) 12/19/2010   Behcet's disease (HCC) 01/06/2004    Past Surgical History:  Procedure Laterality Date   ANTERIOR CERVICAL DECOMP/DISCECTOMY FUSION N/A 08/22/2013   Procedure: ANTERIOR CERVICAL DECOMPRESSION/DISCECTOMY FUSION 2 LEVELS;  Surgeon: Karn Cassis, MD;  Location: [facility];  Service: Neurosurgery;  Laterality: N/A;  C4-5 C5-6 Anterior cervical decompression/diskectomy/fusion   ANTERIOR FUSION CERVICAL SPINE  08/22/2013   c4 5  6          AXILLARY SURGERY     BACK SURGERY     BREAST EXCISIONAL BIOPSY Right  1986   BREAST SURGERY     Cysts excised   CARDIAC CATHETERIZATION  09/18/2013   CESAREAN SECTION  03/2001   x 1   CHOLECYSTECTOMY  12/2004   COLONOSCOPY  03/2013   DILATION AND CURETTAGE OF UTERUS     EXCISION OF BREAST LESION Right 08/18/2017   Procedure: EXCISION OF RIGHT AXILLARY SOFT TISSUE MASS;  Surgeon: Carolan Shiver, MD;  Location: [facility];  Service: General;  Laterality: Right;   FEMUR IM NAIL Left 04/02/2020   Procedure: INTRAMEDULLARY (IM) NAIL FEMORAL;  Surgeon: Sheral Apley, MD;  Location: [facility];  Service: Orthopedics;  Laterality: Left;   HAND SURGERY Right 07/2001   dupytren   HYSTEROSCOPY     IR SI JOINT INJ / ARTH LEFT W/IMAG GUIDE  2018   si fusion , left    IR SI JOINT INJ / ARTH RIGHT W/IMAG GUIDE Right 2018   JOINT REPLACEMENT Right 11/2016   partial knee replacement   KNEE ARTHROSCOPY WITH LATERAL MENISECTOMY Right 12/12/2012   Procedure: KNEE ARTHROSCOPY WITH LATERAL MENISECTOMY;  Surgeon: Nilda Simmer, MD;  Location: [facility];  Service: Orthopedics;  Laterality: Right;   KNEE SURGERY Left 05/1998   LEFT HEART CATHETERIZATION WITH CORONARY ANGIOGRAM N/A 09/18/2013   Procedure: LEFT HEART CATHETERIZATION WITH CORONARY ANGIOGRAM;  Surgeon: Micheline Chapman, MD;  Location: [facility] C[facility];  Service: Cardiovascular;  Laterality: N/A;   LUMBAR DISC SURGERY     OOPHORECTOMY     BSO   PELVIC LAPAROSCOPY     DL   SPINE SURGERY     SUBMANDIBULAR MASS EXCISION     THYROID CYST EXCISION  1983   thyroglossal duct cyst   VAGINAL HYSTERECTOMY  07/2007   LAVH BSO    OB History     Gravida  3   Para  1   Term  1   Preterm      AB  2   Living  1      SAB      IAB      Ectopic      Multiple      Live Births               Home Medications    Prior to Admission medications   Medication Sig Start Date End Date Taking? Authorizing Provider  fluconazole (DIFLUCAN) 150 MG tablet Take 1 tablet (150 mg total) by mouth  every 3 (three) days. 12/16/21  Yes , Rolly Salter E, FNP  acetaminophen (TYLENOL) 500 MG tablet Take 500 mg by mouth 2 (two) times daily.    [provider]  Adalimumab 40 MG/0.8ML PNKT Inject 40 mg into the skin every Saturday.    [provider]  Aloe-Sodium Chloride (AYR SALINE NASAL GEL NA) Place 1 spray into the nose daily as needed (congestion).    [provider]  aspirin EC 325 MG tablet Take 325 mg by mouth 2 (two) times daily.     [provider]  atorvastatin (LIPITOR) 20 MG tablet Take 1 tablet (20 mg total) by mouth once daily 08/07/21     azelastine (ASTELIN) 0.1 % nasal spray Place 2 sprays into both nostrils once daily 09/03/20     benzoyl peroxide 10 % LIQD Apply to shoulder / surgical area twice daily for 2 days before surgery. Do not use day of surgery. Ok to substitute with Benzoyl Peroxide 10% cream or lotion if wash is unavailable. 11/11/21     Calcium Carb-Cholecalciferol (CALCIUM/VITAMIN D) 600-400 MG-UNIT TABS Take 1 tablet by mouth 3 (three) times daily.    [provider]  colchicine 0.6 MG tablet Take 1 tablet by mouth 2 times daily 05/29/21     dexamethasone (DECADRON) 4 MG/ML injection Inject 1 mL (4 mg total) into the muscle once as needed (for adrenal crisis) for up to 1 dose. 12/26/20     diphenhydrAMINE (BENADRYL) 25 mg capsule Take 25 mg by mouth at bedtime.    [provider]  docusate sodium (COLACE) 100 MG capsule Take 1 capsule (100 mg total) by mouth 2 (two) times daily for 10 days Take after surgery while also taking narcotic medications 11/11/21     DULoxetine (CYMBALTA) 60 MG capsule Take 60 mg by mouth daily.    [provider]  DULoxetine (CYMBALTA) 60 MG capsule Take 1 capsule (60 mg total) by mouth once daily 01/27/21     fluocinonide cream (LIDEX) 0.05 % Apply 1 application topically daily as needed (rash).  05/07/15   [provider]  fluticasone (FLONASE) 50 MCG/ACT nasal spray Place 1  spray into the nose daily as needed for allergies.     [provider]  fluticasone furoate-vilanterol (BREO ELLIPTA) 200-25 MCG/ACT AEPB Inhale 1 inhalation into the lungs once daily 08/06/21     Fluticasone-Umeclidin-Vilant (TRELEGY ELLIPTA) 100-62.5-25 MCG/ACT AEPB Inhale 1 inhalation into the lungs once daily 08/26/21     folic acid (FOLVITE) 1 MG tablet Take 2 tablets by mouth once daily in the afternoon 01/16/21     gabapentin (NEURONTIN) 100 MG capsule TAKE 1 CAPSULE BY MOUTH 3 TIMES DAILY 02/05/21     lansoprazole (PREVACID) 30 MG capsule Take 30 mg by mouth daily. 07/17/19   [provider]  lansoprazole (PREVACID) 30 MG capsule Take 1 capsule  by mouth once daily 01/21/21     leflunomide (ARAVA) 20 MG tablet Take 1 tablet by mouth once daily 01/16/21     loperamide (IMODIUM) 2 MG capsule Take 1 capsule (2 mg total) by mouth as needed for diarrhea or loose stools. Patient taking differently: Take 2-4 mg by mouth See admin instructions. Take 2 capsules ( ) at onset of diarrhea, and 1 capsule ( ) every 4 hours after until symptoms subside 02/16/19   Kathlen Mody, MD  Magnesium 500 MG CAPS Take 500 mg by mouth 3 (three) times daily.    [provider]  Multiple Vitamins-Minerals (BARIATRIC MULTIVITAMINS/IRON PO) Take 1 tablet by mouth daily.    [provider]  mupirocin ointment (BACTROBAN) 2 % Apply to affected areas 3  times daily for 7 days 05/07/20     NEEDLE, DISP, 18 G (BD HYPODERMIC NEEDLE) 18G X 1" MISC Use to withdraw dexamethasone  from vial 12/26/20     SYRINGE-NEEDLE, DISP, 3 ML 25G X 1" 3 ML MISC Use to inject dexamethasone into the muscle. 12/26/20     nystatin-hydrocortisone-diphenhydrAMINE-lidocaine Swish and swallow 5 to 10 MLs by mouth every 6 hours as needed 08/26/21     ondansetron (ZOFRAN) 4 MG tablet Take 1 tablet (4 mg total) by mouth every 8 (eight) hours as needed for Nausea for up to 7 days 11/11/21     Oral Electrolytes (THERMOTABS PO) Take  2 tablets by mouth daily as needed (hydration). May take an additional 2 more 2 tablet doses as needed for physical activity or heat    [provider]  OVER THE COUNTER MEDICATION Place 1 drop into both eyes as needed (dry eyes). Crocodile tears otc eye drops    [provider]  Pancrelipase, Lip-Prot-Amyl, 24000-76000 units CPEP Take 3-5 capsules by mouth See admin instructions. Take 5 capsules by mouth with meals and 3 capsules with snacks 06/29/19   [provider]  Potassium Chloride 40 MEQ/15ML (20%) SOLN Measure 7.5 mLs (dilulte in a full glass of water) and drink by mouth 3 (three) times daily for 90 days. 12/03/21     potassium chloride SA (KLOR-CON M) 20 MEQ tablet TAKE 1 TABLET BY MOUTH THREE TIMES DAILY 06/18/21     predniSONE (DELTASONE) 10 MG tablet Take 1 tablet (10 mg total) by mouth daily. 04/19/20   [place], Alvira Philips, DO  predniSONE (DELTASONE) 10 MG tablet Take 1 tablet by mouth once daily 01/16/21     predniSONE (DELTASONE) 2.5 MG tablet Take 1 tablet by mouth once daily as needed (adrenal insufficiency) EMERGENCY DOSE -due to renal insufficiency 01/16/21     propranolol (INDERAL) 80 MG tablet TAKE 1 TABLET BY MOUTH TWICE DAILY (REPLACES METOPROLOL) 12/03/20   Monge, Petra Kuba, NP  TUBERCULIN SYR 1CC/27GX1/2" (B-D TB SYRINGE 1CC/27GX1/2") 27G X 1/2" 1 ML MISC Use to inject dexamethasone as needed 12/26/20     Vitamin A 2400 MCG (8000 UT) CAPS Take 1 capsule (8,000 Units total) by mouth once daily 02/28/21       Family History Family History  Problem Relation Age of Onset   Diabetes Mother    Breast cancer Mother        Age 64's   Stroke Mother    Depression Mother    Hyperlipidemia Mother    Gastric cancer Father    Diabetes Sister    Hypertension Sister    Breast cancer Sister        Age 16   Hyperlipidemia Sister    Thyroid disease Sister    CAD Maternal Grandfather    Polymyositis Maternal Aunt    Myasthenia gravis Maternal Aunt    Lupus Cousin     Rheum arthritis Cousin    Skin cancer Maternal Aunt    Prostate cancer Paternal Uncle     Social History Social History   Tobacco Use   Smoking status: Never   Smokeless tobacco: Never  Vaping Use   Vaping Use: Never used  Substance Use Topics   Alcohol use: No    Alcohol/week: 0.0 standard drinks of alcohol   Drug use: No     Allergies   Celebrex [celecoxib], Fentanyl, Latex, Sulfa drugs cross reactors, Sulfites, Simponi [golimumab], Spiractazide [hydrochlorothiazide w-spironolactone], Adhesive [tape], Codeine, Epinephrine, Erythromycin, Loratadine, and Morphine and related   Review of Systems Review of Systems Per HPI  Physical Exam Triage Vital Signs ED Triage Vitals  Enc Vitals  Group     BP 12/16/21 1608 136/80     Pulse Rate 12/16/21 1608 83     Resp 12/16/21 1608 20     Temp 12/16/21 1608 98.4 F (36.9 C)     Temp Source 12/16/21 1608 Oral     SpO2 12/16/21 1608 95 %     Weight --      Height --      Head Circumference --      Peak Flow --      Pain Score 12/16/21 1605 5     Pain Loc --      Pain Edu? --      Excl. in GC? --    No data found.  Updated Vital Signs BP 136/80 (BP Location: Left Arm)   Pulse 83   Temp 98.4 F (36.9 C) (Oral)   Resp 20   SpO2 95%   Visual Acuity Right Eye Distance:   Left Eye Distance:   Bilateral Distance:    Right Eye Near:   Left Eye Near:    Bilateral Near:     Physical Exam Constitutional:      General: She is not in acute distress.    Appearance: Normal appearance. She is not toxic-appearing or diaphoretic.  HENT:     Head: Normocephalic and atraumatic.  Eyes:     Extraocular Movements: Extraocular movements intact.     Conjunctiva/sclera: Conjunctivae normal.  Cardiovascular:     Rate and Rhythm: Normal rate and regular rhythm.     Pulses: Normal pulses.     Heart sounds: Normal heart sounds.  Pulmonary:     Effort: Pulmonary effort is normal. No respiratory distress.     Breath sounds:  Normal breath sounds.  Abdominal:     General: Bowel sounds are normal. There is no distension.     Palpations: Abdomen is soft.     Tenderness: There is no abdominal tenderness.  Genitourinary:    Comments: Deferred with shared decision making.  Self swab performed. Neurological:     General: No focal deficit present.     Mental Status: She is alert and oriented to person, place, and time. Mental status is at baseline.  Psychiatric:        Mood and Affect: Mood normal.        Behavior: Behavior normal.        Thought Content: Thought content normal.        Judgment: Judgment normal.      UC Treatments / Results  Labs (all labs ordered are listed, but only abnormal results are displayed) Labs Reviewed  POCT URINALYSIS DIP (MANUAL ENTRY)  CERVICOVAGINAL ANCILLARY ONLY    EKG   Radiology No results found.  Procedures Procedures (including critical care time)  Medications Ordered in UC Medications - No data to display  Initial Impression / Assessment and Plan / UC Course  I have reviewed the triage vital signs and the nursing notes.  Pertinent labs & imaging results that were available during my care of the patient were reviewed by me and considered in my medical decision making (see chart for details).     I am concerned for antibiotic induced yeast infection given that UA was completely negative.  Will opt to treat with Diflucan.  Patient reports that she is not currently taking colchicine and has taken this approximately 1 month ago for thrush and has tolerated well.  Cervicovaginal swab pending.  Patient has no concern for STDs so will  test for BV and yeast.  Awaiting results for any further treatment.  Patient advised to follow-up with PCP or urgent care if symptoms persist or worsen.  Patient verbalized understanding and was agreeable with plan. Final Clinical Impressions(s) / UC Diagnoses   Final diagnoses:  Dysuria     Discharge Instructions      I am  suspicious of vaginal yeast infection.  Your vaginal swab is pending.  I am treating you with Diflucan.  Please follow-up if symptoms persist or worsen.    ED Prescriptions     Medication Sig Dispense Auth. Provider   fluconazole (DIFLUCAN) 150 MG tablet Take 1 tablet (150 mg total) by mouth every 3 (three) days. 2 tablet [place], Acie Fredrickson, [place]      PDMP not reviewed this encounter.   Gustavus Bryant, [place] 12/16/21 [phone]

## 2021-12-17 ENCOUNTER — Other Ambulatory Visit (HOSPITAL_COMMUNITY): Payer: Self-pay

## 2021-12-17 LAB — CERVICOVAGINAL ANCILLARY ONLY
Bacterial Vaginitis (gardnerella): NEGATIVE
Candida Glabrata: NEGATIVE
Candida Vaginitis: NEGATIVE
Comment: NEGATIVE
Comment: NEGATIVE
Comment: NEGATIVE

## 2021-12-18 ENCOUNTER — Encounter: Payer: Self-pay | Admitting: Physical Therapy

## 2021-12-18 ENCOUNTER — Other Ambulatory Visit (HOSPITAL_COMMUNITY): Payer: Self-pay

## 2021-12-18 ENCOUNTER — Ambulatory Visit: Payer: Medicare Other | Admitting: Physical Therapy

## 2021-12-18 DIAGNOSIS — R600 Localized edema: Secondary | ICD-10-CM

## 2021-12-18 DIAGNOSIS — M6281 Muscle weakness (generalized): Secondary | ICD-10-CM

## 2021-12-18 DIAGNOSIS — M25511 Pain in right shoulder: Secondary | ICD-10-CM | POA: Diagnosis not present

## 2021-12-18 NOTE — Therapy (Signed)
OUTPATIENT PHYSICAL THERAPY TREATMENT   Patient Name: [name] MRN: [id number] DOB:Dec 18, 1960, 61 y.o., female Today's Date: 12/18/2021  END OF SESSION:  PT End of Session - 12/18/21 1310     Visit Number 3    Number of Visits 20    Date for PT Re-Evaluation 02/17/22    Authorization Type MCR    PT Start Time 1300    PT Stop Time 1340    PT Time Calculation (min) 40 min    Activity Tolerance Patient tolerated treatment well    Behavior During Therapy WFL for tasks assessed/performed              Past Medical History:  Diagnosis Date   Acid reflux    Adrenal insufficiency ([place])    Anxiety    when she gets sick she gets anxious   Arrhythmia    Arthritis    inflammatory arthritis   Asthma    Behcet's syndrome (HCC)    Chronic back pain    Chronic neck pain    Colitis    Connective tissue disorder (HCC)    Depression    Dysrhythmia    POTS syndrome   Endometritis    Foot drop    due to muscle weakness from immunosuppressants   GERD (gastroesophageal reflux disease)    Gluten intolerance    Heart murmur    IBS (irritable bowel syndrome)    Myocardial infarction ([place])    per pt, mild mi due to immunosuppressant therapy   Nasal ulcer    gets occasionally from medications   Osteoarthritis    Osteoporosis    T score reported -2.7   Pneumonia    20 years ago   PONV (postoperative nausea and vomiting)    also low blood pressure at times. do not use lactated ringers. must have sugar with saline d/t adrenal insufficiency   Refusal of blood transfusions as patient is Jehovah's Witness    Renal cyst 2019   Sickle cell trait ([place])    Stroke ([place]) 2014   small peripheral stroke. Mononeuritis multiplex   Subjective visual disturbance 07/14/2012   Superficial phlebitis and thrombophlebitis of both lower extremities 2019   Tear of lateral meniscus of right knee, current    Tear of lateral & medial meniscus of right knee   Tinnitus    left ear sees  Dr Lucia Gaskins (ENT)   Uveitis    Visual changes 2019   left eye vision only with distance, right eye has floater. vascular changes   Past Surgical History:  Procedure Laterality Date   ANTERIOR CERVICAL DECOMP/DISCECTOMY FUSION N/A 08/22/2013   Procedure: ANTERIOR CERVICAL DECOMPRESSION/DISCECTOMY FUSION 2 LEVELS;  Surgeon: Floyce Stakes, MD;  Location: [facility];  Service: Neurosurgery;  Laterality: N/A;  C4-5 C5-6 Anterior cervical decompression/diskectomy/fusion   ANTERIOR FUSION CERVICAL SPINE  08/22/2013   c4 5  6          AXILLARY SURGERY     BACK SURGERY     BREAST EXCISIONAL BIOPSY Right 1986   BREAST SURGERY     Cysts excised   CARDIAC CATHETERIZATION  09/18/2013   CESAREAN SECTION  03/2001   x 1   CHOLECYSTECTOMY  12/2004   COLONOSCOPY  03/2013   DILATION AND CURETTAGE OF UTERUS     EXCISION OF BREAST LESION Right 08/18/2017   Procedure: EXCISION OF RIGHT AXILLARY SOFT TISSUE MASS;  Surgeon: Herbert Pun, MD;  Location: [facility];  Service: General;  Laterality: Right;  FEMUR IM NAIL Left 04/02/2020   Procedure: INTRAMEDULLARY (IM) NAIL FEMORAL;  Surgeon: Renette Butters, MD;  Location: [facility];  Service: Orthopedics;  Laterality: Left;   HAND SURGERY Right 07/2001   dupytren   HYSTEROSCOPY     IR SI JOINT INJ / ARTH LEFT W/IMAG GUIDE  2018   si fusion , left    IR SI JOINT INJ / ARTH RIGHT W/IMAG GUIDE Right 2018   JOINT REPLACEMENT Right 11/2016   partial knee replacement   KNEE ARTHROSCOPY WITH LATERAL MENISECTOMY Right 12/12/2012   Procedure: KNEE ARTHROSCOPY WITH LATERAL MENISECTOMY;  Surgeon: Lorn Junes, MD;  Location: [facility];  Service: Orthopedics;  Laterality: Right;   KNEE SURGERY Left 05/1998   LEFT HEART CATHETERIZATION WITH CORONARY ANGIOGRAM N/A 09/18/2013   Procedure: LEFT HEART CATHETERIZATION WITH CORONARY ANGIOGRAM;  Surgeon: Blane Ohara, MD;  Location: [facility] CATH LAB;  Service: Cardiovascular;  Laterality: N/A;   LUMBAR  DISC SURGERY     OOPHORECTOMY     BSO   PELVIC LAPAROSCOPY     DL   SPINE SURGERY     SUBMANDIBULAR MASS EXCISION     THYROID CYST EXCISION  1983   thyroglossal duct cyst   VAGINAL HYSTERECTOMY  07/2007   LAVH BSO   Patient Active Problem List   Diagnosis Date Noted   Pancreatic insufficiency 04/01/2020   Tremor 08/04/2019   Abnormal cardiovascular stress test 08/04/2019   Hypokalemia 02/15/2019   Chest pain 02/15/2019   Iatrogenic adrenal insufficiency ([place]) 01/15/2018   Orthostatic hypotension 12/07/2013   Adrenal insufficiency ([place]) 09/25/2013   Palpitations 09/16/2013   Cervical stenosis of spinal canal 08/22/2013   Lumbar degenerative disc disease 12/30/2012   Tear of lateral meniscus of right knee, current    Osteopenia    Lumbar back pain 09/13/2012   Subjective visual disturbance 07/14/2012   Endometriosis    Endometrial polyp    IBS (irritable bowel syndrome)    Acid reflux    Colitis    Behcet's syndrome (HCC)    Glucose intolerance (impaired glucose tolerance) 12/19/2010   Behcet's disease ([place]) 01/06/2004    PCP: Evalee Mutton, MD   REFERRING PROVIDER: Ewing, Sharyn Blitz   REFERRING DIAG: M19.011 (ICD-10-CM) - Primary osteoarthritis, right shoulder   THERAPY DIAG:  Acute pain of right shoulder  Muscle weakness (generalized)  Localized edema  Rationale for Evaluation and Treatment: Rehabilitation  ONSET DATE: Rt TSA with biceps tenodesis 11/17/21  SUBJECTIVE:  SUBJECTIVE STATEMENT: She reports pain is doing well and has been controlled by tylenol. She has been dealing with UTI.  PERTINENT HISTORY: Rt TSA with biceps tenodesis 11/17/21  PAIN:  Are you having pain? Yes: NPRS scale: 2/10 Pain location: Rt shoulder around incision Pain description: ache, no N/T  reported Aggravating factors: movement of her Rt arm Relieving factors: rest, ice  PRECAUTIONS: Shoulder, see protocol in clinic folder  WEIGHT BEARING RESTRICTIONS: Non weight bearing on Rt shoulder until week 6  FALLS:  Has patient fallen in last 6 months? Yes. Number of falls 1   OCCUPATION:   PLOF: Independent with basic ADLs  PATIENT GOALS: improve use of her Rt arm  NEXT MD VISIT:   OBJECTIVE:   DIAGNOSTIC FINDINGS:    PATIENT SURVEYS:  FOTO 4% functional score at eval, goal is 44%  COGNITION: Overall cognitive status: Within functional limits for tasks assessed     SENSATION: WFL  POSTURE:   UPPER EXTREMITY ROM:   Passive ROM Right eval Left eval  Shoulder flexion 85   Shoulder extension    Shoulder abduction 60   Shoulder adduction    Shoulder internal rotation To belly Arm by side   Shoulder external rotation 14 Arm by side   Elbow flexion    Elbow extension    Wrist flexion    Wrist extension    Wrist ulnar deviation    Wrist radial deviation    Wrist pronation    Wrist supination    (Blank rows = not tested)  UPPER EXTREMITY MMT:  MMT  Right eval Left eval  Shoulder flexion    Shoulder extension    Shoulder abduction    Shoulder adduction    Shoulder internal rotation    Shoulder external rotation    Middle trapezius    Lower trapezius    Elbow flexion    Elbow extension    Wrist flexion    Wrist extension    Wrist ulnar deviation    Wrist radial deviation    Wrist pronation    Wrist supination    Grip strength (lbs)    (Blank rows = not tested)  SHOULDER SPECIAL TESTS:   JOINT MOBILITY TESTING:  Not tested today post op, Pt reports she has hypomobility at baseline  PALPATION:     TODAY'S TREATMENT:  12/18/21 -pendulums X 15 all planes -Scapular retractions X 20 -Rt elbow AROM X 20 -Supine AAROM flexion, abduction, ER X 10 (full ROM noted with these so will DC these) -Gentle submax isometrics 3-5 sec for  Rt shoulder  X10 for flexion, abd, IR, ER -wall walks X 10 flexion, X 10 abd   12/03/21 -Manual therapy for Rt shoulder PROM gentle each direction per post op ROM limits -Pendulums all planes X 15 each -Gripping red digigrip 2X15 -Table slides X 10 each for AAROM flexion, abd, ER -Scapular retractions X 15 -Posterior and anterior shoulder rolls  X15 each -Rt elbow AROM X 15 -Pulleys 2 min flexion -Pulleys 2 min abduction  -Gentle submax isometrics 3-5 sec for Rt shoulder  X10 for flexion, abd, IR, ER      PATIENT EDUCATION: Education details: HEP, PT plan of care, post op precautions review Person educated: Patient Education method: Explanation, Demonstration, Verbal cues, and Handouts Education comprehension: verbalized understanding and needs further education   HOME EXERCISE PROGRAM: Access Code: [email or web address] URL: [email or web address] Date: 12/18/2021 Prepared by: Elsie Ra  Exercises - Elbow AROM Flexion & Extension Supinated Forearm  -  2 x daily - 6 x weekly - 1-2 sets - 10 reps - Seated Scapular Retraction  - 2 x daily - 6 x weekly - 1-2 sets - 10 reps - Standing Shoulder Abduction Slides at Wall  - 2 x daily - 6 x weekly - 1-2 sets - 10 reps - Standing shoulder flexion wall slides  - 2 x daily - 6 x weekly - 1-2 sets - 10 reps - Isometric Shoulder Flexion at Wall  - 2 x daily - 6 x weekly - 1 sets - 10-20 reps - 5 hold - Standing Isometric Shoulder Internal Rotation at Doorway  - 2 x daily - 6 x weekly - 1-2 sets - 10 reps - 5 hold - Standing Isometric Shoulder External Rotation with Doorway  - 2 x daily - 6 x weekly - 1-2 sets - 10 reps - 5 hold - Standing Isometric Shoulder Abduction with Doorway - Arm Bent  - 2 x daily - 6 x weekly - 1-2 sets - 10 reps - 5 hold  ASSESSMENT:  CLINICAL IMPRESSION: She now has WNL PROM. Progressed HEP from PROM to more AAROM and isometrics. We will plan to progress to AROM at the 6 week mark 12/29/21 per protocol. She  has now met her short term PT goals.   OBJECTIVE IMPAIRMENTS: decreased activity tolerance, decreased shoulder mobility, decreased ROM, decreased strength, impaired flexibility, impaired UE use, postural dysfunction, and pain.  ACTIVITY LIMITATIONS: reaching, lifting, carry,  cleaning, driving, and or occupation  PERSONAL FACTORS: PMH significant for Behcet's disease (on Humira), RA, secondary adrenal insufficiency, OSA not on CPAP, mild reactive airway disease, pituitary adenoma, IDA, IBS, chronic diarrhea, chronic GERD, fat malabsorption and steroid induced osteoporosis.  also affecting patient's functional outcome.  REHAB POTENTIAL: Good  CLINICAL DECISION MAKING: Stable/uncomplicated  EVALUATION COMPLEXITY: Low    GOALS: Short term PT Goals Target date: 12/23/2021 Pt will be I and compliant with HEP. Baseline:  Goal status: MET Pt will decrease pain by 25% overall Baseline: Goal status: MET  Long term PT goals Target date: 02/17/2022 Pt will improve Rt shoulder AROM to [facility] to improve functional reaching Baseline: Goal status: ongoing Pt will improve  Rt shoulder strength to at least 4+/5 MMT to improve functional strength Baseline: Goal status: ongoing Pt will improve FOTO to at least 44% functional to show improved function Baseline: Goal status: ongoing Pt will reduce pain to overall less than 3/10 with usual activity and work activity. Baseline: Goal status: ongoing  PLAN: PT FREQUENCY: 1-3 times per week   PT DURATION: 12 weeks  PLANNED INTERVENTIONS (unless contraindicated): aquatic PT, Canalith repositioning, cryotherapy, Electrical stimulation, Iontophoresis with 4 mg/ml dexamethasome, Moist heat, traction, Ultrasound, gait training, Therapeutic exercise, balance training, neuromuscular re-education, patient/family education, prosthetic training, manual techniques, passive ROM, dry needling, taping, vasopnuematic device, vestibular, spinal manipulations, joint  manipulations  PLAN FOR NEXT SESSION: protocol is in patient folder summary is as follows: Week 1-6 (until 12/29/21) -sling for 2 weeks then may remove at home, continue to wear in community up to week 6 -avoid hand behind back -avoid >30 deg ER -No shoulder active elevation -No weight bearing on shoulder Week 6-12 (12/29/21 to 02/09/21) -discontinue sling -motion recovery IR without force -Begin active shoulder elevation -may begin ER at 90 deg abd to 60 deg Weight bearing on UE allowed

## 2021-12-23 ENCOUNTER — Other Ambulatory Visit (HOSPITAL_COMMUNITY): Payer: Self-pay

## 2021-12-24 ENCOUNTER — Other Ambulatory Visit (HOSPITAL_COMMUNITY): Payer: Self-pay

## 2021-12-24 MED ORDER — PREDNISONE 10 MG PO TABS
10.0000 mg | ORAL_TABLET | Freq: Every day | ORAL | 0 refills | Status: DC
  Filled 2021-12-24: qty 90, 90d supply, fill #0

## 2021-12-25 ENCOUNTER — Encounter: Payer: Medicare Other | Admitting: Physical Therapy

## 2021-12-25 ENCOUNTER — Other Ambulatory Visit (HOSPITAL_COMMUNITY): Payer: Self-pay

## 2021-12-26 ENCOUNTER — Other Ambulatory Visit (HOSPITAL_COMMUNITY): Payer: Self-pay

## 2021-12-31 ENCOUNTER — Encounter: Payer: Self-pay | Admitting: Physical Therapy

## 2021-12-31 ENCOUNTER — Ambulatory Visit: Payer: Medicare Other | Admitting: Physical Therapy

## 2021-12-31 DIAGNOSIS — M25511 Pain in right shoulder: Secondary | ICD-10-CM | POA: Diagnosis not present

## 2021-12-31 DIAGNOSIS — M6281 Muscle weakness (generalized): Secondary | ICD-10-CM | POA: Diagnosis not present

## 2021-12-31 DIAGNOSIS — R600 Localized edema: Secondary | ICD-10-CM | POA: Diagnosis not present

## 2021-12-31 NOTE — Therapy (Signed)
OUTPATIENT PHYSICAL THERAPY TREATMENT   Patient Name: [name] MRN: [id number] DOB:[birth year], 61 y.o., female Today's Date: 12/31/2021  END OF SESSION:  PT End of Session - 12/31/21 1013     Visit Number 4    Number of Visits 20    Date for PT Re-Evaluation 02/17/22    Authorization Type MCR    PT Start Time 1012    PT Stop Time 1052    PT Time Calculation (min) 40 min    Activity Tolerance Patient tolerated treatment well    Behavior During Therapy WFL for tasks assessed/performed              Past Medical History:  Diagnosis Date   Acid reflux    Adrenal insufficiency ([place])    Anxiety    when she gets sick she gets anxious   Arrhythmia    Arthritis    inflammatory arthritis   Asthma    Behcet's syndrome (HCC)    Chronic back pain    Chronic neck pain    Colitis    Connective tissue disorder (HCC)    Depression    Dysrhythmia    POTS syndrome   Endometritis    Foot drop    due to muscle weakness from immunosuppressants   GERD (gastroesophageal reflux disease)    Gluten intolerance    Heart murmur    IBS (irritable bowel syndrome)    Myocardial infarction ([place])    per pt, mild mi due to immunosuppressant therapy   Nasal ulcer    gets occasionally from medications   Osteoarthritis    Osteoporosis    T score reported -2.7   Pneumonia    20 years ago   PONV (postoperative nausea and vomiting)    also low blood pressure at times. do not use lactated ringers. must have sugar with saline d/t adrenal insufficiency   Refusal of blood transfusions as patient is Jehovah's Witness    Renal cyst 2019   Sickle cell trait ([place])    Stroke ([place]) 2014   small peripheral stroke. Mononeuritis multiplex   Subjective visual disturbance 07/14/2012   Superficial phlebitis and thrombophlebitis of both lower extremities 2019   Tear of lateral meniscus of right knee, current    Tear of lateral & medial meniscus of right knee   Tinnitus    left ear sees  Dr Lucia Gaskins (ENT)   Uveitis    Visual changes 2019   left eye vision only with distance, right eye has floater. vascular changes   Past Surgical History:  Procedure Laterality Date   ANTERIOR CERVICAL DECOMP/DISCECTOMY FUSION N/A 08/22/2013   Procedure: ANTERIOR CERVICAL DECOMPRESSION/DISCECTOMY FUSION 2 LEVELS;  Surgeon: Floyce Stakes, MD;  Location: [facility];  Service: Neurosurgery;  Laterality: N/A;  C4-5 C5-6 Anterior cervical decompression/diskectomy/fusion   ANTERIOR FUSION CERVICAL SPINE  08/22/2013   c4 5  6          AXILLARY SURGERY     BACK SURGERY     BREAST EXCISIONAL BIOPSY Right 1986   BREAST SURGERY     Cysts excised   CARDIAC CATHETERIZATION  09/18/2013   CESAREAN SECTION  03/2001   x 1   CHOLECYSTECTOMY  12/2004   COLONOSCOPY  03/2013   DILATION AND CURETTAGE OF UTERUS     EXCISION OF BREAST LESION Right 08/18/2017   Procedure: EXCISION OF RIGHT AXILLARY SOFT TISSUE MASS;  Surgeon: Herbert Pun, MD;  Location: [facility];  Service: General;  Laterality: Right;  FEMUR IM NAIL Left 04/02/2020   Procedure: INTRAMEDULLARY (IM) NAIL FEMORAL;  Surgeon: Renette Butters, MD;  Location: [facility];  Service: Orthopedics;  Laterality: Left;   HAND SURGERY Right 07/2001   dupytren   HYSTEROSCOPY     IR SI JOINT INJ / ARTH LEFT W/IMAG GUIDE  2018   si fusion , left    IR SI JOINT INJ / ARTH RIGHT W/IMAG GUIDE Right 2018   JOINT REPLACEMENT Right 11/2016   partial knee replacement   KNEE ARTHROSCOPY WITH LATERAL MENISECTOMY Right 12/12/2012   Procedure: KNEE ARTHROSCOPY WITH LATERAL MENISECTOMY;  Surgeon: Lorn Junes, MD;  Location: [facility];  Service: Orthopedics;  Laterality: Right;   KNEE SURGERY Left 05/1998   LEFT HEART CATHETERIZATION WITH CORONARY ANGIOGRAM N/A 09/18/2013   Procedure: LEFT HEART CATHETERIZATION WITH CORONARY ANGIOGRAM;  Surgeon: Blane Ohara, MD;  Location: [facility] CATH LAB;  Service: Cardiovascular;  Laterality: N/A;   LUMBAR  DISC SURGERY     OOPHORECTOMY     BSO   PELVIC LAPAROSCOPY     DL   SPINE SURGERY     SUBMANDIBULAR MASS EXCISION     THYROID CYST EXCISION  1983   thyroglossal duct cyst   VAGINAL HYSTERECTOMY  07/2007   LAVH BSO   Patient Active Problem List   Diagnosis Date Noted   Pancreatic insufficiency 04/01/2020   Tremor 08/04/2019   Abnormal cardiovascular stress test 08/04/2019   Hypokalemia 02/15/2019   Chest pain 02/15/2019   Iatrogenic adrenal insufficiency ([place]) 01/15/2018   Orthostatic hypotension 12/07/2013   Adrenal insufficiency ([place]) 09/25/2013   Palpitations 09/16/2013   Cervical stenosis of spinal canal 08/22/2013   Lumbar degenerative disc disease 12/30/2012   Tear of lateral meniscus of right knee, current    Osteopenia    Lumbar back pain 09/13/2012   Subjective visual disturbance 07/14/2012   Endometriosis    Endometrial polyp    IBS (irritable bowel syndrome)    Acid reflux    Colitis    Behcet's syndrome (HCC)    Glucose intolerance (impaired glucose tolerance) 12/19/2010   Behcet's disease ([place]) 01/06/2004    PCP: Evalee Mutton, MD   REFERRING PROVIDER: Ewing, Sharyn Blitz   REFERRING DIAG: M19.011 (ICD-10-CM) - Primary osteoarthritis, right shoulder   THERAPY DIAG:  Acute pain of right shoulder  Muscle weakness (generalized)  Localized edema  Rationale for Evaluation and Treatment: Rehabilitation  ONSET DATE: Rt TSA with biceps tenodesis 11/17/21  SUBJECTIVE:  SUBJECTIVE STATEMENT: She denies any pain upon arrival  PERTINENT HISTORY: Rt TSA with biceps tenodesis 11/17/21  PAIN:  Are you having pain? Yes: NPRS scale: 0/10 Pain location: Rt shoulder around incision Pain description: ache, no N/T reported Aggravating factors: movement of her Rt arm Relieving  factors: rest, ice  PRECAUTIONS: Shoulder, see protocol in clinic folder  WEIGHT BEARING RESTRICTIONS: Non weight bearing on Rt shoulder until week 6  FALLS:  Has patient fallen in last 6 months? Yes. Number of falls 1   OCCUPATION:   PLOF: Independent with basic ADLs  PATIENT GOALS: improve use of her Rt arm  NEXT MD VISIT:   OBJECTIVE:   DIAGNOSTIC FINDINGS:    PATIENT SURVEYS:  FOTO 4% functional score at eval, goal is 44%  COGNITION: Overall cognitive status: Within functional limits for tasks assessed     SENSATION: WFL  POSTURE:   UPPER EXTREMITY ROM:   Passive ROM Right eval Right 01/07/21  Shoulder flexion 85 A:   Shoulder extension    Shoulder abduction 60 A:   Shoulder adduction    Shoulder internal rotation To belly Arm by side A:   Shoulder external rotation 14 Arm by side A:  Elbow flexion    Elbow extension    Wrist flexion    Wrist extension    Wrist ulnar deviation    Wrist radial deviation    Wrist pronation    Wrist supination    (Blank rows = not tested)  UPPER EXTREMITY MMT:  MMT  Eval Did not test due to post op status 01/07/21  Shoulder flexion    Shoulder extension    Shoulder abduction    Shoulder adduction    Shoulder internal rotation    Shoulder external rotation    Middle trapezius    Lower trapezius    Elbow flexion    Elbow extension    Wrist flexion    Wrist extension    Wrist ulnar deviation    Wrist radial deviation    Wrist pronation    Wrist supination    Grip strength (lbs)    (Blank rows = not tested)  SHOULDER SPECIAL TESTS:   JOINT MOBILITY TESTING:  Not tested today post op, Pt reports she has hypomobility at baseline  PALPATION:     TODAY'S TREATMENT:  12/31/21 -UE ranger with 2# wrist weight for 10 reps each of flexion, circles in flexion CW and CCW, and abuction -Rolling red ball up wall for AAROM flexion stretch bilat holding 5 sec X 10 -Standing rows and extensions red 2X10  (progress to green next visit -Standing IR and ER with red 2X10 (progress to green next visit -Bicep curl with green 2X10 -Seated shoulder flexion 2# X 6 bilat then 1# X 10 bilat -Seated shoulder abduction 2# X 5 bilat then 1# X 10 bilat   12/18/21 -pendulums X 15 all planes -Scapular retractions X 20 -Rt elbow AROM X 20 -Supine AAROM flexion, abduction, ER X 10 (full ROM noted with these so will DC these) -Gentle submax isometrics 3-5 sec for Rt shoulder  X10 for flexion, abd, IR, ER -wall walks X 10 flexion, X 10 abd   PATIENT EDUCATION: Education details: HEP, PT plan of care, post op precautions review Person educated: Patient Education method: Explanation, Demonstration, Verbal cues, and Handouts Education comprehension: verbalized understanding and needs further education   HOME EXERCISE PROGRAM: Access Code: [email or web address] URL: [email or web address] Date: 12/31/2021 Prepared by: [place] with  Anchored Resistance  - 2 x daily - 6 x weekly - 2-3 sets - 10-20 reps - Shoulder extension with resistance - Neutral  - 2 x daily - 6 x weekly - 2-3 sets - 10 reps - Shoulder External Rotation with Anchored Resistance  - 2 x daily - 6 x weekly - 2 sets - 10 reps - Shoulder Internal Rotation with Resistance  - 2 x daily - 6 x weekly - 2 sets - 10 reps - Standing Single Arm Elbow Flexion with Resistance  - 2 x daily - 6 x weekly - 2 sets - 10 reps - Seated Shoulder Flexion with Dumbbells  - 2 x daily - 6 x weekly - 1-2 sets - 10 reps - Seated Shoulder Abduction with Dumbbells - Thumbs Up  - 2 x daily - 6 x weekly - 1-2 sets - 10 reps  ASSESSMENT:  CLINICAL IMPRESSION: She is making great progress with PT up to this point. She is now beyond 6 weeks post op so progressed into AROM and light strengthening program to her tolerance and fatigue today. I updated her HEP today to reflect this progress.   OBJECTIVE IMPAIRMENTS: decreased activity  tolerance, decreased shoulder mobility, decreased ROM, decreased strength, impaired flexibility, impaired UE use, postural dysfunction, and pain.  ACTIVITY LIMITATIONS: reaching, lifting, carry,  cleaning, driving, and or occupation  PERSONAL FACTORS: PMH significant for Behcet's disease (on Humira), RA, secondary adrenal insufficiency, OSA not on CPAP, mild reactive airway disease, pituitary adenoma, IDA, IBS, chronic diarrhea, chronic GERD, fat malabsorption and steroid induced osteoporosis.  also affecting patient's functional outcome.  REHAB POTENTIAL: Good  CLINICAL DECISION MAKING: Stable/uncomplicated  EVALUATION COMPLEXITY: Low    GOALS: Short term PT Goals Target date: 12/23/2021 Pt will be I and compliant with HEP. Baseline:  Goal status: MET Pt will decrease pain by 25% overall Baseline: Goal status: MET  Long term PT goals Target date: 02/17/2022 Pt will improve Rt shoulder AROM to [facility] to improve functional reaching Baseline: Goal status: ongoing Pt will improve  Rt shoulder strength to at least 4+/5 MMT to improve functional strength Baseline: Goal status: ongoing Pt will improve FOTO to at least 44% functional to show improved function Baseline: Goal status: ongoing Pt will reduce pain to overall less than 3/10 with usual activity and work activity. Baseline: Goal status: ongoing  PLAN: PT FREQUENCY: 1-3 times per week   PT DURATION: 12 weeks  PLANNED INTERVENTIONS (unless contraindicated): aquatic PT, Canalith repositioning, cryotherapy, Electrical stimulation, Iontophoresis with 4 mg/ml dexamethasome, Moist heat, traction, Ultrasound, gait training, Therapeutic exercise, balance training, neuromuscular re-education, patient/family education, prosthetic training, manual techniques, passive ROM, dry needling, taping, vasopnuematic device, vestibular, spinal manipulations, joint manipulations  PLAN FOR NEXT SESSION:  MD progress note needed protocol is in  patient folder summary is as follows: Week 1-6 (until 12/29/21) -sling for 2 weeks then may remove at home, continue to wear in community up to week 6 -avoid hand behind back -avoid >30 deg ER -No shoulder active elevation -No weight bearing on shoulder Week 6-12 (12/29/21 to 02/09/21) -discontinue sling -motion recovery IR without force -Begin active shoulder elevation -may begin ER at 90 deg abd to 60 deg Weight bearing on UE allowed

## 2022-01-06 ENCOUNTER — Other Ambulatory Visit: Payer: Self-pay | Admitting: Nurse Practitioner

## 2022-01-06 ENCOUNTER — Other Ambulatory Visit (HOSPITAL_COMMUNITY): Payer: Self-pay

## 2022-01-06 MED ORDER — PROPRANOLOL HCL 80 MG PO TABS
80.0000 mg | ORAL_TABLET | Freq: Two times a day (BID) | ORAL | 0 refills | Status: DC
  Filled 2022-01-06: qty 60, 30d supply, fill #0

## 2022-01-07 ENCOUNTER — Other Ambulatory Visit (HOSPITAL_COMMUNITY): Payer: Self-pay

## 2022-01-07 ENCOUNTER — Encounter: Payer: Self-pay | Admitting: Physical Therapy

## 2022-01-07 ENCOUNTER — Ambulatory Visit (INDEPENDENT_AMBULATORY_CARE_PROVIDER_SITE_OTHER): Payer: BLUE CROSS/BLUE SHIELD | Admitting: Physical Therapy

## 2022-01-07 DIAGNOSIS — M6281 Muscle weakness (generalized): Secondary | ICD-10-CM | POA: Diagnosis not present

## 2022-01-07 DIAGNOSIS — M25511 Pain in right shoulder: Secondary | ICD-10-CM | POA: Diagnosis not present

## 2022-01-07 DIAGNOSIS — R600 Localized edema: Secondary | ICD-10-CM

## 2022-01-07 NOTE — Therapy (Addendum)
OUTPATIENT PHYSICAL THERAPY TREATMENT   Patient Name: [name] MRN: [id number] DOB:[birth year], 62 y.o., female Today's Date: 01/14/2022  END OF SESSION:  PT End of Session - 01/14/22 1148     Visit Number 5    Number of Visits 20    Date for PT Re-Evaluation 02/17/22    Authorization Type MCR    PT Start Time 1344    PT Stop Time 1426    PT Time Calculation (min) 42 min    Activity Tolerance Patient tolerated treatment well    Behavior During Therapy WFL for tasks assessed/performed               Past Medical History:  Diagnosis Date   Acid reflux    Adrenal insufficiency ([place])    Anxiety    when she gets sick she gets anxious   Arrhythmia    Arthritis    inflammatory arthritis   Asthma    Behcet's syndrome (HCC)    Chronic back pain    Chronic neck pain    Colitis    Connective tissue disorder (HCC)    Depression    Dysrhythmia    POTS syndrome   Endometritis    Foot drop    due to muscle weakness from immunosuppressants   GERD (gastroesophageal reflux disease)    Gluten intolerance    Heart murmur    IBS (irritable bowel syndrome)    Myocardial infarction ([place])    per pt, mild mi due to immunosuppressant therapy   Nasal ulcer    gets occasionally from medications   Osteoarthritis    Osteoporosis    T score reported -2.7   Pneumonia    20 years ago   PONV (postoperative nausea and vomiting)    also low blood pressure at times. do not use lactated ringers. must have sugar with saline d/t adrenal insufficiency   Refusal of blood transfusions as patient is Jehovah's Witness    Renal cyst 2019   Sickle cell trait ([place])    Stroke ([place]) 2014   small peripheral stroke. Mononeuritis multiplex   Subjective visual disturbance 07/14/2012   Superficial phlebitis and thrombophlebitis of both lower extremities 2019   Tear of lateral meniscus of right knee, current    Tear of lateral & medial meniscus of right knee   Tinnitus    left ear  sees Dr Lucia Gaskins (ENT)   Uveitis    Visual changes 2019   left eye vision only with distance, right eye has floater. vascular changes   Past Surgical History:  Procedure Laterality Date   ANTERIOR CERVICAL DECOMP/DISCECTOMY FUSION N/A 08/22/2013   Procedure: ANTERIOR CERVICAL DECOMPRESSION/DISCECTOMY FUSION 2 LEVELS;  Surgeon: Floyce Stakes, MD;  Location: [facility];  Service: Neurosurgery;  Laterality: N/A;  C4-5 C5-6 Anterior cervical decompression/diskectomy/fusion   ANTERIOR FUSION CERVICAL SPINE  08/22/2013   c4 5  6          AXILLARY SURGERY     BACK SURGERY     BREAST EXCISIONAL BIOPSY Right 1986   BREAST SURGERY     Cysts excised   CARDIAC CATHETERIZATION  09/18/2013   CESAREAN SECTION  03/2001   x 1   CHOLECYSTECTOMY  12/2004   COLONOSCOPY  03/2013   DILATION AND CURETTAGE OF UTERUS     EXCISION OF BREAST LESION Right 08/18/2017   Procedure: EXCISION OF RIGHT AXILLARY SOFT TISSUE MASS;  Surgeon: Herbert Pun, MD;  Location: [facility];  Service: General;  Laterality:  Right;   FEMUR IM NAIL Left 04/02/2020   Procedure: INTRAMEDULLARY (IM) NAIL FEMORAL;  Surgeon: Renette Butters, MD;  Location: [facility];  Service: Orthopedics;  Laterality: Left;   HAND SURGERY Right 07/2001   dupytren   HYSTEROSCOPY     IR SI JOINT INJ / ARTH LEFT W/IMAG GUIDE  2018   si fusion , left    IR SI JOINT INJ / ARTH RIGHT W/IMAG GUIDE Right 2018   JOINT REPLACEMENT Right 11/2016   partial knee replacement   KNEE ARTHROSCOPY WITH LATERAL MENISECTOMY Right 12/12/2012   Procedure: KNEE ARTHROSCOPY WITH LATERAL MENISECTOMY;  Surgeon: Lorn Junes, MD;  Location: [facility];  Service: Orthopedics;  Laterality: Right;   KNEE SURGERY Left 05/1998   LEFT HEART CATHETERIZATION WITH CORONARY ANGIOGRAM N/A 09/18/2013   Procedure: LEFT HEART CATHETERIZATION WITH CORONARY ANGIOGRAM;  Surgeon: Blane Ohara, MD;  Location: [facility];  Service: Cardiovascular;  Laterality: N/A;    LUMBAR DISC SURGERY     OOPHORECTOMY     BSO   PELVIC LAPAROSCOPY     DL   SPINE SURGERY     SUBMANDIBULAR MASS EXCISION     THYROID CYST EXCISION  1983   thyroglossal duct cyst   VAGINAL HYSTERECTOMY  07/2007   [facility] BSO   Patient Active Problem List   Diagnosis Date Noted   Pancreatic insufficiency 04/01/2020   Tremor 08/04/2019   Abnormal cardiovascular stress test 08/04/2019   Hypokalemia 02/15/2019   Chest pain 02/15/2019   Iatrogenic adrenal insufficiency ([place]) 01/15/2018   Orthostatic hypotension 12/07/2013   Adrenal insufficiency ([place]) 09/25/2013   Palpitations 09/16/2013   Cervical stenosis of spinal canal 08/22/2013   Lumbar degenerative disc disease 12/30/2012   Tear of lateral meniscus of right knee, current    Osteopenia    Lumbar back pain 09/13/2012   Subjective visual disturbance 07/14/2012   Endometriosis    Endometrial polyp    IBS (irritable bowel syndrome)    Acid reflux    Colitis    Behcet's syndrome (HCC)    Glucose intolerance (impaired glucose tolerance) 12/19/2010   Behcet's disease ([place]) 01/06/2004    PCP: Evalee Mutton, MD   REFERRING PROVIDER: Ewing, Sharyn Blitz   REFERRING DIAG: M19.011 (ICD-10-CM) - Primary osteoarthritis, right shoulder   THERAPY DIAG:  Acute pain of right shoulder  Muscle weakness (generalized)  Localized edema  Rationale for Evaluation and Treatment: Rehabilitation  ONSET DATE: Rt TSA with biceps tenodesis 11/17/21  SUBJECTIVE:  SUBJECTIVE STATEMENT: She denies any pain upon arrival  PERTINENT HISTORY: Rt TSA with biceps tenodesis 11/17/21  PAIN:  Are you having pain? Yes: NPRS scale: 0/10 Pain location: Rt shoulder around incision Pain description: ache, no N/T reported Aggravating factors: movement of her Rt  arm Relieving factors: rest, ice  PRECAUTIONS: Shoulder, see protocol in clinic folder  WEIGHT BEARING RESTRICTIONS: Non weight bearing on Rt shoulder until week 6  FALLS:  Has patient fallen in last 6 months? Yes. Number of falls 1   OCCUPATION:   PLOF: Independent with basic ADLs  PATIENT GOALS: improve use of her Rt arm  NEXT MD VISIT:   OBJECTIVE:   DIAGNOSTIC FINDINGS:    PATIENT SURVEYS:  FOTO 4% functional score at eval, goal is 44%  COGNITION: Overall cognitive status: Within functional limits for tasks assessed     SENSATION: WFL  POSTURE:   UPPER EXTREMITY ROM:   Passive ROM Right eval Right 01/07/22  Shoulder flexion 85 A:WNL   Shoulder extension    Shoulder abduction 60 A:WNL   Shoulder adduction    Shoulder internal rotation To belly Arm by side A:WNL   Shoulder external rotation 14 Arm by side A:WNL  Elbow flexion    Elbow extension    Wrist flexion    Wrist extension    Wrist ulnar deviation    Wrist radial deviation    Wrist pronation    Wrist supination    (Blank rows = not tested)  UPPER EXTREMITY MMT:  MMT  Eval Did not test due to post op status 01/07/22 Rt  Shoulder flexion  4  Shoulder extension    Shoulder abduction  4  Shoulder adduction    Shoulder internal rotation  4+  Shoulder external rotation  4-  Middle trapezius    Lower trapezius    Elbow flexion    Elbow extension    Wrist flexion    Wrist extension    Wrist ulnar deviation    Wrist radial deviation    Wrist pronation    Wrist supination    Grip strength (lbs)    (Blank rows = not tested)  SHOULDER SPECIAL TESTS:   JOINT MOBILITY TESTING:  Not tested today post op, Pt reports she has hypomobility at baseline  PALPATION:     TODAY'S TREATMENT:  01/07/22 -UBE L2.5, 2 min fwd, 2 min retro -Seated rows and extensions green 2X10  -Seated IR and ER with Green 2X10  -Bicep curl with 3# 2X10 -Seated shoulder flexion 2# 2X10 on Rt (3# 2X10 on  Lt) -Seated shoulder abduction 2# X 10, 7,3 reps on Rt (3# 2X10 on Rt) -Horizontal abduction yellow 2X10 -D1 and D2 flexion 2# X 10 -Seated chest press green 2X10 (progress to blue next time) -Seated bilat shoulder flexion AAROM 3# bar 2X10   PATIENT EDUCATION: Education details: HEP, PT plan of care, post op precautions review Person educated: Patient Education method: Explanation, Demonstration, Verbal cues, and Handouts Education comprehension: verbalized understanding and needs further education   HOME EXERCISE PROGRAM: Access Code: [email or web address] URL: [email or web address] Date: 12/31/2021 Prepared by: Elsie Ra  Exercises - Standing Row with Anchored Resistance  - 2 x daily - 6 x weekly - 2-3 sets - 10-20 reps - Shoulder extension with resistance - Neutral  - 2 x daily - 6 x weekly - 2-3 sets - 10 reps - Shoulder External Rotation with Anchored Resistance  - 2 x daily - 6 x weekly - 2 sets - 10  reps - Shoulder Internal Rotation with Resistance  - 2 x daily - 6 x weekly - 2 sets - 10 reps - Standing Single Arm Elbow Flexion with Resistance  - 2 x daily - 6 x weekly - 2 sets - 10 reps - Seated Shoulder Flexion with Dumbbells  - 2 x daily - 6 x weekly - 1-2 sets - 10 reps - Seated Shoulder Abduction with Dumbbells - Thumbs Up  - 2 x daily - 6 x weekly - 1-2 sets - 10 reps  ASSESSMENT:  CLINICAL IMPRESSION: She continues to make good progress with PT up to this point. She showed improvements in her strength and AROM as noted above. She now has full ROM so we will focus more on strength over her remaining visits  OBJECTIVE IMPAIRMENTS: decreased activity tolerance, decreased shoulder mobility, decreased ROM, decreased strength, impaired flexibility, impaired UE use, postural dysfunction, and pain.  ACTIVITY LIMITATIONS: reaching, lifting, carry,  cleaning, driving, and or occupation  PERSONAL FACTORS: PMH significant for Behcet's disease (on Humira), RA, secondary  adrenal insufficiency, OSA not on CPAP, mild reactive airway disease, pituitary adenoma, IDA, IBS, chronic diarrhea, chronic GERD, fat malabsorption and steroid induced osteoporosis.  also affecting patient's functional outcome.  REHAB POTENTIAL: Good  CLINICAL DECISION MAKING: Stable/uncomplicated  EVALUATION COMPLEXITY: Low    GOALS: Short term PT Goals Target date: 12/23/2021 Pt will be I and compliant with HEP. Baseline:  Goal status: MET Pt will decrease pain by 25% overall Baseline: Goal status: MET  Long term PT goals Target date: 02/17/2022 Pt will improve Rt shoulder AROM to [facility] to improve functional reaching Baseline: Goal status: ongoing Pt will improve  Rt shoulder strength to at least 4+/5 MMT to improve functional strength Baseline: Goal status: ongoing Pt will improve FOTO to at least 44% functional to show improved function Baseline: Goal status: ongoing Pt will reduce pain to overall less than 3/10 with usual activity and work activity. Baseline: Goal status: ongoing  PLAN: PT FREQUENCY: 1-3 times per week   PT DURATION: 12 weeks  PLANNED INTERVENTIONS (unless contraindicated): aquatic PT, Canalith repositioning, cryotherapy, Electrical stimulation, Iontophoresis with 4 mg/ml dexamethasome, Moist heat, traction, Ultrasound, gait training, Therapeutic exercise, balance training, neuromuscular re-education, patient/family education, prosthetic training, manual techniques, passive ROM, dry needling, taping, vasopnuematic device, vestibular, spinal manipulations, joint manipulations  PLAN FOR NEXT SESSION:  Progress strength as tolerated protocol is in patient folder summary is as follows: Week 1-6 (until 12/29/21) -sling for 2 weeks then may remove at home, continue to wear in community up to week 6 -avoid hand behind back -avoid >30 deg ER -No shoulder active elevation -No weight bearing on shoulder Week 6-12 (12/29/21 to 02/09/21) -discontinue  sling -motion recovery IR without force -Begin active shoulder elevation -may begin ER at 90 deg abd to 60 deg Weight bearing on UE allowed

## 2022-01-08 ENCOUNTER — Other Ambulatory Visit (HOSPITAL_COMMUNITY): Payer: Self-pay

## 2022-01-08 MED ORDER — PREDNISONE 10 MG PO TABS
10.0000 mg | ORAL_TABLET | Freq: Every day | ORAL | 0 refills | Status: DC
  Filled 2022-01-08 – 2022-03-15 (×2): qty 90, 90d supply, fill #0

## 2022-01-09 ENCOUNTER — Other Ambulatory Visit (HOSPITAL_COMMUNITY): Payer: Self-pay

## 2022-01-12 ENCOUNTER — Encounter: Payer: Self-pay | Admitting: Cardiovascular Disease

## 2022-01-12 ENCOUNTER — Other Ambulatory Visit (HOSPITAL_COMMUNITY): Payer: Self-pay

## 2022-01-12 ENCOUNTER — Ambulatory Visit: Payer: BLUE CROSS/BLUE SHIELD | Attending: Cardiovascular Disease | Admitting: Cardiovascular Disease

## 2022-01-12 VITALS — BP 102/60 | HR 70 | Ht 64.0 in | Wt 184.2 lb

## 2022-01-12 DIAGNOSIS — I951 Orthostatic hypotension: Secondary | ICD-10-CM | POA: Diagnosis not present

## 2022-01-12 DIAGNOSIS — M352 Behcet's disease: Secondary | ICD-10-CM | POA: Diagnosis not present

## 2022-01-12 DIAGNOSIS — Z01818 Encounter for other preprocedural examination: Secondary | ICD-10-CM

## 2022-01-12 DIAGNOSIS — R002 Palpitations: Secondary | ICD-10-CM | POA: Diagnosis not present

## 2022-01-12 NOTE — Progress Notes (Signed)
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 She has numerous other medical noncardiac problems including Behcet's syndrome severe sacroiliac joint disease, gastroesophageal reflux disease, colitis with recurrent diarrhea, nonsecreting pituitary adenoma, tremor.  She has done well from a cardiovascular point of view, but has had a lot of orthopedic problems.  She underwent "reconstruction of her lumbar spine" at [facility] in March.  This has led to substantial improvement.  Before surgery she had no proprioception in her lower extremities and could not walk in the dark, was also having bladder continence issues.  She feels much more steady now.  After that she underwent surgery on her right shoulder in November, due to injuries related to using a crutch for a long time.  Her blood pressure remains borderline low, but she has not had any episodes of syncope.  She continues to take prednisone 10 mg daily in addition to Humira, but is no longer on fludrocortisone.  She denies palpitations, lower extremity edema, orthopnea, PND.  She has not had any problems with chest pain.   Past Medical History:  Diagnosis Date   Acid reflux    Adrenal insufficiency (HCC)    Anxiety    when she gets sick she gets anxious   Arrhythmia    Arthritis    inflammatory arthritis   Asthma    Behcet's syndrome (HCC)    Chronic back pain    Chronic neck pain    Colitis    Connective tissue disorder (HCC)    Depression    Dysrhythmia    POTS syndrome   Endometritis    Foot drop    due to muscle weakness from immunosuppressants   GERD  (gastroesophageal reflux disease)    Gluten intolerance    Heart murmur    IBS (irritable bowel syndrome)    Myocardial infarction ([place])    per pt, mild mi due to immunosuppressant therapy   Nasal ulcer    gets occasionally from medications   Osteoarthritis    Osteoporosis    T score reported -2.7   Pneumonia    20 years ago   PONV (postoperative nausea and vomiting)    also low blood pressure at times. do not use lactated ringers. must have sugar with saline d/t adrenal insufficiency   Refusal of blood transfusions as patient is Jehovah's Witness    Renal cyst 2019   Sickle cell trait ([place])    Stroke ([place]) 2014   small peripheral stroke. Mononeuritis multiplex   Subjective visual disturbance 07/14/2012   Superficial phlebitis and thrombophlebitis of both lower extremities 2019   Tear of lateral meniscus of right knee, current    Tear of lateral & medial meniscus of right knee   Tinnitus    left ear sees Dr Lucia Gaskins (ENT)   Uveitis    Visual changes 2019   left eye vision only with distance, right eye has floater. vascular changes    Past Surgical History:  Procedure Laterality Date   ANTERIOR CERVICAL DECOMP/DISCECTOMY FUSION N/A 08/22/2013   Procedure: ANTERIOR CERVICAL DECOMPRESSION/DISCECTOMY FUSION 2 LEVELS;  Surgeon: Stann Mainland  Andres Shad, MD;  Location: [facility];  Service: Neurosurgery;  Laterality: N/A;  C4-5 C5-6 Anterior cervical decompression/diskectomy/fusion   ANTERIOR FUSION CERVICAL SPINE  08/22/2013   c4 5  6          AXILLARY SURGERY     BACK SURGERY     BREAST EXCISIONAL BIOPSY Right 1986   BREAST SURGERY     Cysts excised   CARDIAC CATHETERIZATION  09/18/2013   CESAREAN SECTION  03/2001   x 1   CHOLECYSTECTOMY  12/2004   COLONOSCOPY  03/2013   DILATION AND CURETTAGE OF UTERUS     EXCISION OF BREAST LESION Right 08/18/2017   Procedure: EXCISION OF RIGHT AXILLARY SOFT TISSUE MASS;  Surgeon: Herbert Pun, MD;  Location: [facility];  Service: General;   Laterality: Right;   FEMUR IM NAIL Left 04/02/2020   Procedure: INTRAMEDULLARY (IM) NAIL FEMORAL;  Surgeon: Renette Butters, MD;  Location: [facility];  Service: Orthopedics;  Laterality: Left;   HAND SURGERY Right 07/2001   dupytren   HYSTEROSCOPY     IR SI JOINT INJ / ARTH LEFT W/IMAG GUIDE  2018   si fusion , left    IR SI JOINT INJ / ARTH RIGHT W/IMAG GUIDE Right 2018   JOINT REPLACEMENT Right 11/2016   partial knee replacement   KNEE ARTHROSCOPY WITH LATERAL MENISECTOMY Right 12/12/2012   Procedure: KNEE ARTHROSCOPY WITH LATERAL MENISECTOMY;  Surgeon: Lorn Junes, MD;  Location: [facility];  Service: Orthopedics;  Laterality: Right;   KNEE SURGERY Left 05/1998   LEFT HEART CATHETERIZATION WITH CORONARY ANGIOGRAM N/A 09/18/2013   Procedure: LEFT HEART CATHETERIZATION WITH CORONARY ANGIOGRAM;  Surgeon: Blane Ohara, MD;  Location: [facility] C[facility] LAB;  Service: Cardiovascular;  Laterality: N/A;   LUMBAR DISC SURGERY     OOPHORECTOMY     BSO   PELVIC LAPAROSCOPY     DL   SPINE SURGERY     SUBMANDIBULAR MASS EXCISION     THYROID CYST EXCISION  1983   thyroglossal duct cyst   VAGINAL HYSTERECTOMY  07/2007   LAVH BSO    Current Medications: Outpatient Medications Prior to Visit  Medication Sig Dispense Refill   acetaminophen (TYLENOL) 500 MG tablet Take 500 mg by mouth 2 (two) times daily.     Adalimumab 40 MG/0.8ML PNKT Inject 40 mg into the skin every Saturday.     aspirin EC 325 MG tablet Take 325 mg by mouth 2 (two) times daily.      azelastine (ASTELIN) 0.1 % nasal spray Place 2 sprays into both nostrils once daily 30 mL 3   Calcium Carb-Cholecalciferol (CALCIUM/VITAMIN D) 600-400 MG-UNIT TABS Take 1 tablet by mouth 3 (three) times daily.     colchicine 0.6 MG tablet Take 1 tablet by mouth 2 times daily 180 tablet 1   diphenhydrAMINE (BENADRYL) 25 mg capsule Take 25 mg by mouth at bedtime.     DULoxetine (CYMBALTA) 60 MG capsule Take 1 capsule (60 mg total) by mouth  once daily 90 capsule 3   folic acid (FOLVITE) 1 MG tablet Take 2 tablets by mouth once daily in the afternoon 180 tablet 3   gabapentin (NEURONTIN) 100 MG capsule TAKE 1 CAPSULE BY MOUTH 3 TIMES DAILY 270 capsule 3   lansoprazole (PREVACID) 30 MG capsule Take 1 capsule  by mouth once daily 90 capsule 3   loperamide (IMODIUM) 2 MG capsule Take 1 capsule (2 mg total) by mouth as needed for diarrhea  or loose stools. (Patient taking differently: Take 2-4 mg by mouth See admin instructions. Take 2 capsules (4mg ) at onset of diarrhea, and 1 capsule (2mg ) every 4 hours after until symptoms subside) 30 capsule 0   Magnesium 500 MG CAPS Take 500 mg by mouth 3 (three) times daily.     Multiple Vitamins-Minerals (BARIATRIC MULTIVITAMINS/IRON PO) Take 1 tablet by mouth daily.     Pancrelipase, Lip-Prot-Amyl, 24000-76000 units CPEP Take 3-5 capsules by mouth See admin instructions. Take 5 capsules by mouth with meals and 3 capsules with snacks     potassium chloride SA (KLOR-CON M) 20 MEQ tablet TAKE 1 TABLET BY MOUTH THREE TIMES DAILY 270 tablet 3   predniSONE (DELTASONE) 10 MG tablet Take 1 tablet (10 mg total) by mouth daily. 90 tablet 0   propranolol (INDERAL) 80 MG tablet Take 1 tablet (80 mg total) by mouth 2 (two) times daily. PATIENT MUST SCHEDULE APPOINTMENT FOR FUTURE REFILLS FIRST ATTEMPT 60 tablet 0   ranitidine (ZANTAC) 75 MG tablet Take 75 mg by mouth in the morning.     SYRINGE-NEEDLE, DISP, 3 ML 25G X 1" 3 ML MISC Use to inject dexamethasone into the muscle. 30 each 0   TUBERCULIN SYR 1CC/27GX1/2" (B-D TB SYRINGE 1CC/27GX1/2") 27G X 1/2" 1 ML MISC Use to inject dexamethasone as needed 30 each 0   Aloe-Sodium Chloride (AYR SALINE NASAL GEL NA) Place 1 spray into the nose daily as needed (congestion). (Patient not taking: Reported on 01/12/2022)     atorvastatin (LIPITOR) 20 MG tablet Take 1 tablet (20 mg total) by mouth once daily (Patient not taking: Reported on 01/12/2022) 30 tablet 11   benzoyl  peroxide 10 % LIQD Apply to shoulder / surgical area twice daily for 2 days before surgery. Do not use day of surgery. Ok to substitute with Benzoyl Peroxide 10% cream or lotion if wash is unavailable. (Patient not taking: Reported on 01/12/2022) 227 g 0   dexamethasone (DECADRON) 4 MG/ML injection Inject 1 mL (4 mg total) into the muscle once as needed (for adrenal crisis) for up to 1 dose. (Patient not taking: Reported on 01/12/2022) 1 mL 1   docusate sodium (COLACE) 100 MG capsule Take 1 capsule (100 mg total) by mouth 2 (two) times daily for 10 days Take after surgery while also taking narcotic medications (Patient not taking: Reported on 01/12/2022) 20 capsule 0   DULoxetine (CYMBALTA) 60 MG capsule Take 60 mg by mouth daily.     ferrous sulfate 325 (65 FE) MG EC tablet Take 325 mg by mouth daily in the afternoon.     fluconazole (DIFLUCAN) 150 MG tablet Take 1 tablet (150 mg total) by mouth every 3 (three) days. (Patient not taking: Reported on 01/12/2022) 2 tablet 0   fluocinonide cream (LIDEX) 0.05 % Apply 1 application topically daily as needed (rash).  (Patient not taking: Reported on 01/12/2022)     fluticasone (FLONASE) 50 MCG/ACT nasal spray Place 1 spray into the nose daily as needed for allergies.  (Patient not taking: Reported on 01/12/2022)     fluticasone furoate-vilanterol (BREO ELLIPTA) 200-25 MCG/ACT AEPB Inhale 1 inhalation into the lungs once daily (Patient not taking: Reported on 01/12/2022) 60 each 6   Fluticasone-Umeclidin-Vilant (TRELEGY ELLIPTA) 100-62.5-25 MCG/ACT AEPB Inhale 1 inhalation into the lungs once daily (Patient not taking: Reported on 01/12/2022) 60 each 5   lansoprazole (PREVACID) 30 MG capsule Take 30 mg by mouth daily.     leflunomide (ARAVA) 20 MG tablet Take  1 tablet by mouth once daily (Patient not taking: Reported on 01/12/2022) 90 tablet 3   mupirocin ointment (BACTROBAN) 2 % Apply to affected areas 3  times daily for 7 days (Patient not taking: Reported on 01/12/2022) 22 g 0    NEEDLE, DISP, 18 G (BD HYPODERMIC NEEDLE) 18G X 1" MISC Use to withdraw dexamethasone from vial (Patient not taking: Reported on 01/12/2022) 30 each 0   nystatin-hydrocortisone-diphenhydrAMINE-lidocaine Swish and swallow 5 to 10 MLs by mouth every 6 hours as needed (Patient not taking: Reported on 01/12/2022) 120 mL 3   ondansetron (ZOFRAN) 4 MG tablet Take 1 tablet (4 mg total) by mouth every 8 (eight) hours as needed for Nausea for up to 7 days (Patient not taking: Reported on 01/12/2022) 20 tablet 0   Oral Electrolytes (THERMOTABS PO) Take 2 tablets by mouth daily as needed (hydration). May take an additional 2 more 2 tablet doses as needed for physical activity or heat (Patient not taking: Reported on 01/12/2022)     OVER THE COUNTER MEDICATION Place 1 drop into both eyes as needed (dry eyes). Crocodile tears otc eye drops (Patient not taking: Reported on 01/12/2022)     Potassium Chloride 40 MEQ/15ML (20%) SOLN Measure 7.5 mLs (dilulte in a full glass of water) and drink by mouth 3 (three) times daily for 90 days. (Patient not taking: Reported on 01/12/2022) 2025 mL 0   predniSONE (DELTASONE) 10 MG tablet Take 1 tablet (10 mg total) by mouth daily.     predniSONE (DELTASONE) 2.5 MG tablet Take 1 tablet by mouth once daily as needed (adrenal insufficiency) EMERGENCY DOSE -due to renal insufficiency (Patient not taking: Reported on 01/12/2022) 60 tablet 1   Vitamin A 2400 MCG (8000 UT) CAPS Take 1 capsule (8,000 Units total) by mouth once daily (Patient not taking: Reported on 01/12/2022) 30 capsule 2   No facility-administered medications prior to visit.     Allergies:   Celebrex [celecoxib], Fentanyl, Latex, Sulfa drugs cross reactors, Sulfites, Simponi [golimumab], Spiractazide [hydrochlorothiazide w-spironolactone], Adhesive [tape], Codeine, Epinephrine, Erythromycin, Loratadine, and Morphine and related   Social History   Socioeconomic History   Marital status: Divorced    Spouse name: Not on file    Number of children: 1   Years of education: Not on file   Highest education level: Master's degree (e.g., MA, MS, MEng, MEd, MSW, MBA)  Occupational History   Occupation: disabled  Tobacco Use   Smoking status: Never   Smokeless tobacco: Never  Vaping Use   Vaping Use: Never used  Substance and Sexual Activity   Alcohol use: No    Alcohol/week: 0.0 standard drinks of alcohol   Drug use: No   Sexual activity: Not Currently    Birth control/protection: Surgical    Comment: 1st intercourse 22 yo-5 partners  Other Topics Concern   Not on file  Social History Narrative   Not on file   Social Determinants of Health   Financial Resource Strain: Low Risk  (08/12/2017)   Overall Financial Resource Strain (CARDIA)    Difficulty of Paying Living Expenses: Not very hard  Food Insecurity: No Food Insecurity (08/12/2017)   Hunger Vital Sign    Worried About Running Out of Food in the Last Year: Never true    Ran Out of Food in the Last Year: Never true  Transportation Needs: No Transportation Needs (08/12/2017)   PRAPARE - Hydrologist (Medical): No    Lack of Transportation (Non-Medical):  No  Physical Activity: Inactive (08/12/2017)   Exercise Vital Sign    Days of Exercise per Week: 0 days    Minutes of Exercise per Session: 0 min  Stress: Stress Concern Present (08/12/2017)   [place]    Feeling of Stress : Rather much  Social Connections: Somewhat Isolated (08/12/2017)   Social Connection and Isolation Panel [NHANES]    Frequency of Communication with Friends and Family: More than three times a week    Frequency of Social Gatherings with Friends and Family: Twice a week    Attends Religious Services: More than 4 times per year    Active Member of [employer] or Organizations: No    Attends Music therapist: Never    Marital Status: Divorced     Family History:  The patient's family  history includes Breast cancer in her mother and sister; CAD in her maternal grandfather; Depression in her mother; Diabetes in her mother and sister; Gastric cancer in her father; Hyperlipidemia in her mother and sister; Hypertension in her sister; Lupus in her cousin; Myasthenia gravis in her maternal aunt; Polymyositis in her maternal aunt; Prostate cancer in her paternal uncle; Rheum arthritis in her cousin; Skin cancer in her maternal aunt; Stroke in her mother; Thyroid disease in her sister.   ROS:   Please see the history of present illness.    ROS The patient specifically denies any chest pain at rest or with exertion, dyspnea at rest or with exertion, orthopnea, paroxysmal nocturnal dyspnea, syncope, palpitations, focal neurological deficits, intermittent claudication, lower extremity edema, unexplained weight gain, cough, hemoptysis or wheezing.  PHYSICAL EXAM:   VS:  BP 102/60 (BP Location: Left Arm, Patient Position: Sitting, Cuff Size: Normal)   Pulse 70   Ht 5\' 4"  (1.626 m)   Wt 184 lb 3.2 oz (83.6 kg)   SpO2 97%   BMI 31.62 kg/m      General: Alert, oriented x3, no distress, mildly obese.  She has a small cushingoid "hump". Head: no evidence of trauma, PERRL, EOMI, no exophtalmos or lid lag, no myxedema, no xanthelasma; normal ears, nose and oropharynx Neck: normal jugular venous pulsations and no hepatojugular reflux; brisk carotid pulses without delay and no carotid bruits Chest: clear to auscultation, no signs of consolidation by percussion or palpation, normal fremitus, symmetrical and full respiratory excursions Cardiovascular: normal position and quality of the apical impulse, regular rhythm, normal first and second heart sounds, no murmurs, rubs or gallops Abdomen: no tenderness or distention, no masses by palpation, no abnormal pulsatility or arterial bruits, normal bowel sounds, no hepatosplenomegaly Extremities: no clubbing, cyanosis or edema; 2+ radial, ulnar and  brachial pulses bilaterally; 2+ right femoral, posterior tibial and dorsalis pedis pulses; 2+ left femoral, posterior tibial and dorsalis pedis pulses; no subclavian or femoral bruits Neurological: grossly nonfocal Psych: Normal mood and affect     Wt Readings from Last 3 Encounters:  01/12/22 184 lb 3.2 oz (83.6 kg)  12/03/20 172 lb 9.6 oz (78.3 kg)  05/28/20 169 lb (76.7 kg)      Studies/Labs Reviewed:   EKG:  EKG is ordered today shows normal sinus rhythm with mildly reduced voltage diffusely, and slightly delayed R wave progression with transition in V3-V4, but without ischemic repolarization abnormalities.  Normal QTc 412 ms.  It is very similar to her previous tracings.  LABS BMET    Component Value Date/Time   NA 140 04/03/2020 0643   K  3.6 04/03/2020 0643   CL 104 04/03/2020 0643   CO2 28 04/03/2020 0643   GLUCOSE 123 (H) 04/03/2020 0643   BUN 16 04/03/2020 0643   CREATININE 0.67 04/03/2020 0643   CALCIUM 8.6 (L) 04/03/2020 0643   GFRNONAA >60 04/03/2020 0643   GFRAA >60 02/17/2019 0818    Lipid Panel    Component Value Date/Time   CHOL 175 09/18/2013 1030   TRIG 77 09/18/2013 1030   HDL 52 09/18/2013 1030   CHOLHDL 3.4 09/18/2013 1030   VLDL 15 09/18/2013 1030   LDLCALC 108 (H) 09/18/2013 1030     ASSESSMENT:    1. Preoperative clearance   2. Palpitations      PLAN:  In order of problems listed above:  1.  Orthostatic hypotension: No longer a symptomatic problem, although her blood pressure is borderline low.  Note that she is no longer on fludrocortisone.  She is also no longer taking hydrochlorothiazide-spironolactone (which has been prescribed for hypeocalcemia). 2.  Palpitations: On propranolol which helps with both the palpitations and her tremor. 3.  Behcet's: On Humira and prednisone; Dr. Thelma Comp rheumatologist at [facility]. ,    Medication Adjustments/Labs and Tests Ordered: Current medicines are reviewed at length with the patient today.   Concerns regarding medicines are outlined above.  Medication changes, Labs and Tests ordered today are listed in the Patient Instructions below. Patient Instructions  Medication Instructions:  Your physician recommends that you continue on your current medications as directed. Please refer to the Current Medication list given to you today.  *If you need a refill on your cardiac medications before your next appointment, please call your pharmacy*   Follow-Up: At [facility], you and your health needs are our priority.  As part of our continuing mission to provide you with exceptional heart care, we have created designated Provider Care Teams.  These Care Teams include your primary Cardiologist (physician) and Advanced Practice Providers (APPs -  Physician Assistants and Nurse Practitioners) who all work together to provide you with the care you need, when you need it.  We recommend signing up for the patient portal called "MyChart".  Sign up information is provided on this After Visit Summary.  MyChart is used to connect with patients for Virtual Visits (Telemedicine).  Patients are able to view lab/test results, encounter notes, upcoming appointments, etc.  Non-urgent messages can be sent to your provider as well.   To learn more about what you can do with MyChart, go to [email or web address].    Your next appointment:   12 month(s)  The format for your next appointment:   In Person  Provider:   Sanda Klein, MD       Signed, Sanda Klein, MD  01/13/2022 3:30 PM    [address], [place], [place]  [address] Phone: [phone]; Fax: [phone]

## 2022-01-12 NOTE — Patient Instructions (Signed)
Medication Instructions:  Your physician recommends that you continue on your current medications as directed. Please refer to the Current Medication list given to you today.  *If you need a refill on your cardiac medications before your next appointment, please call your pharmacy*   Follow-Up: At  [facility], you and your health needs are our priority.  As part of our continuing mission to provide you with exceptional heart care, we have created designated Provider Care Teams.  These Care Teams include your primary Cardiologist (physician) and Advanced Practice Providers (APPs -  Physician Assistants and Nurse Practitioners) who all work together to provide you with the care you need, when you need it.  We recommend signing up for the patient portal called "MyChart".  Sign up information is provided on this After Visit Summary.  MyChart is used to connect with patients for Virtual Visits (Telemedicine).  Patients are able to view lab/test results, encounter notes, upcoming appointments, etc.  Non-urgent messages can be sent to your provider as well.   To learn more about what you can do with MyChart, go to [email or web address].    Your next appointment:   12 month(s)  The format for your next appointment:   In Person  Provider:   Mihai Croitoru, MD 

## 2022-01-13 ENCOUNTER — Encounter: Payer: Self-pay | Admitting: Cardiovascular Disease

## 2022-01-14 ENCOUNTER — Encounter: Payer: Self-pay | Admitting: Physical Therapy

## 2022-01-14 ENCOUNTER — Ambulatory Visit: Payer: Medicare Other | Admitting: Physical Therapy

## 2022-01-14 DIAGNOSIS — M6281 Muscle weakness (generalized): Secondary | ICD-10-CM | POA: Diagnosis not present

## 2022-01-14 DIAGNOSIS — M25511 Pain in right shoulder: Secondary | ICD-10-CM | POA: Diagnosis not present

## 2022-01-14 DIAGNOSIS — R600 Localized edema: Secondary | ICD-10-CM

## 2022-01-14 NOTE — Therapy (Signed)
OUTPATIENT PHYSICAL THERAPY TREATMENT   Patient Name: [name] MRN: [id number] DOB:1960-05-26, 62 y.o., female Today's Date: 01/14/2022  END OF SESSION:      Past Medical History:  Diagnosis Date   Acid reflux    Adrenal insufficiency (HCC)    Anxiety    when she gets sick she gets anxious   Arrhythmia    Arthritis    inflammatory arthritis   Asthma    Behcet's syndrome (HCC)    Chronic back pain    Chronic neck pain    Colitis    Connective tissue disorder (HCC)    Depression    Dysrhythmia    POTS syndrome   Endometritis    Foot drop    due to muscle weakness from immunosuppressants   GERD (gastroesophageal reflux disease)    Gluten intolerance    Heart murmur    IBS (irritable bowel syndrome)    Myocardial infarction (HCC)    per pt, mild mi due to immunosuppressant therapy   Nasal ulcer    gets occasionally from medications   Osteoarthritis    Osteoporosis    T score reported -2.7   Pneumonia    20 years ago   PONV (postoperative nausea and vomiting)    also low blood pressure at times. do not use lactated ringers. must have sugar with saline d/t adrenal insufficiency   Refusal of blood transfusions as patient is Jehovah's Witness    Renal cyst 2019   Sickle cell trait (HCC)    Stroke (HCC) 2014   small peripheral stroke. Mononeuritis multiplex   Subjective visual disturbance 07/14/2012   Superficial phlebitis and thrombophlebitis of both lower extremities 2019   Tear of lateral meniscus of right knee, current    Tear of lateral & medial meniscus of right knee   Tinnitus    left ear sees Dr Ezzard Standing (ENT)   Uveitis    Visual changes 2019   left eye vision only with distance, right eye has floater. vascular changes   Past Surgical History:  Procedure Laterality Date   ANTERIOR CERVICAL DECOMP/DISCECTOMY FUSION N/A 08/22/2013   Procedure: ANTERIOR CERVICAL DECOMPRESSION/DISCECTOMY FUSION 2 LEVELS;  Surgeon: Karn Cassis, MD;   Location: [facility];  Service: Neurosurgery;  Laterality: N/A;  C4-5 C5-6 Anterior cervical decompression/diskectomy/fusion   ANTERIOR FUSION CERVICAL SPINE  08/22/2013   c4 5  6          AXILLARY SURGERY     BACK SURGERY     BREAST EXCISIONAL BIOPSY Right 1986   BREAST SURGERY     Cysts excised   CARDIAC CATHETERIZATION  09/18/2013   CESAREAN SECTION  03/2001   x 1   CHOLECYSTECTOMY  12/2004   COLONOSCOPY  03/2013   DILATION AND CURETTAGE OF UTERUS     EXCISION OF BREAST LESION Right 08/18/2017   Procedure: EXCISION OF RIGHT AXILLARY SOFT TISSUE MASS;  Surgeon: Carolan Shiver, MD;  Location: [facility];  Service: General;  Laterality: Right;   FEMUR IM NAIL Left 04/02/2020   Procedure: INTRAMEDULLARY (IM) NAIL FEMORAL;  Surgeon: Sheral Apley, MD;  Location: [facility];  Service: Orthopedics;  Laterality: Left;   HAND SURGERY Right 07/2001   dupytren   HYSTEROSCOPY     IR SI JOINT INJ / ARTH LEFT W/IMAG GUIDE  2018   si fusion , left    IR SI JOINT INJ / ARTH RIGHT W/IMAG GUIDE Right 2018   JOINT REPLACEMENT Right 11/2016   partial  knee replacement   KNEE ARTHROSCOPY WITH LATERAL MENISECTOMY Right 12/12/2012   Procedure: KNEE ARTHROSCOPY WITH LATERAL MENISECTOMY;  Surgeon: Lorn Junes, MD;  Location: [facility];  Service: Orthopedics;  Laterality: Right;   KNEE SURGERY Left 05/1998   LEFT HEART CATHETERIZATION WITH CORONARY ANGIOGRAM N/A 09/18/2013   Procedure: LEFT HEART CATHETERIZATION WITH CORONARY ANGIOGRAM;  Surgeon: Blane Ohara, MD;  Location: [facility];  Service: Cardiovascular;  Laterality: N/A;   LUMBAR DISC SURGERY     OOPHORECTOMY     BSO   PELVIC LAPAROSCOPY     DL   SPINE SURGERY     SUBMANDIBULAR MASS EXCISION     THYROID CYST EXCISION  1983   thyroglossal duct cyst   VAGINAL HYSTERECTOMY  07/2007   LAVH BSO   Patient Active Problem List   Diagnosis Date Noted   Pancreatic insufficiency 04/01/2020   Tremor 08/04/2019   Abnormal  cardiovascular stress test 08/04/2019   Hypokalemia 02/15/2019   Chest pain 02/15/2019   Iatrogenic adrenal insufficiency ([place]) 01/15/2018   Orthostatic hypotension 12/07/2013   Adrenal insufficiency ([place]) 09/25/2013   Palpitations 09/16/2013   Cervical stenosis of spinal canal 08/22/2013   Lumbar degenerative disc disease 12/30/2012   Tear of lateral meniscus of right knee, current    Osteopenia    Lumbar back pain 09/13/2012   Subjective visual disturbance 07/14/2012   Endometriosis    Endometrial polyp    IBS (irritable bowel syndrome)    Acid reflux    Colitis    Behcet's syndrome (HCC)    Glucose intolerance (impaired glucose tolerance) 12/19/2010   Behcet's disease ([place]) 01/06/2004    PCP: Evalee Mutton, MD   REFERRING PROVIDER: Ewing, Sharyn Blitz   REFERRING DIAG: M19.011 (ICD-10-CM) - Primary osteoarthritis, right shoulder   THERAPY DIAG:  No diagnosis found.  Rationale for Evaluation and Treatment: Rehabilitation  ONSET DATE: Rt TSA with biceps tenodesis 11/17/21  SUBJECTIVE:                                                                                                                                                                                      SUBJECTIVE STATEMENT: She says her shoulder is getting better.  PERTINENT HISTORY: Rt TSA with biceps tenodesis 11/17/21  PAIN:  Are you having pain? Yes: NPRS scale: 0/10 Pain location: Rt shoulder around incision Pain description: ache, no N/T reported Aggravating factors: movement of her Rt arm Relieving factors: rest, ice  PRECAUTIONS: Shoulder, see protocol in clinic folder  WEIGHT BEARING RESTRICTIONS: Non weight bearing on Rt shoulder until week 6  FALLS:  Has patient fallen in last 6 months?  Yes. Number of falls 1   OCCUPATION:   PLOF: Independent with basic ADLs  PATIENT GOALS: improve use of her Rt arm  NEXT MD VISIT:   OBJECTIVE:   DIAGNOSTIC FINDINGS:    PATIENT  SURVEYS:  FOTO 4% functional score at eval, goal is 44%  COGNITION: Overall cognitive status: Within functional limits for tasks assessed     SENSATION: WFL  POSTURE:   UPPER EXTREMITY ROM:   Passive ROM Right eval Right 01/07/22  Shoulder flexion 85 A:WNL   Shoulder extension    Shoulder abduction 60 A:WNL   Shoulder adduction    Shoulder internal rotation To belly Arm by side A:WNL   Shoulder external rotation 14 Arm by side A:WNL  Elbow flexion    Elbow extension    Wrist flexion    Wrist extension    Wrist ulnar deviation    Wrist radial deviation    Wrist pronation    Wrist supination    (Blank rows = not tested)  UPPER EXTREMITY MMT:  MMT  Eval Did not test due to post op status 01/07/22 Rt  Shoulder flexion  4  Shoulder extension    Shoulder abduction  4  Shoulder adduction    Shoulder internal rotation  4+  Shoulder external rotation  4-  Middle trapezius    Lower trapezius    Elbow flexion    Elbow extension    Wrist flexion    Wrist extension    Wrist ulnar deviation    Wrist radial deviation    Wrist pronation    Wrist supination    Grip strength (lbs)    (Blank rows = not tested)  SHOULDER SPECIAL TESTS:   JOINT MOBILITY TESTING:  Not tested today post op, Pt reports she has hypomobility at baseline  PALPATION:     TODAY'S TREATMENT:  01/14/22 -UBE L3, 2.5 min fwd, 2.5 min retro -Seated rows and extensions green 2X10  -Seated IR and ER with Green 2X10  -Bicep curl with 3# 3X10 (one set each grip) -Seated shoulder flexion 2# 2X10 on Rt (3# 2X10 on Lt) -Seated shoulder abduction 2# X 10, 7,3 reps on Rt (3# 2X10 on Rt) -Horizontal abduction yellow 2X10 -D1 and D2 flexion 2# X 10 each -Seated chest press green 2X10 (progress to blue next time) -Seated bilat shoulder flexion AAROM 3# bar 2X10  01/07/22 -UBE L2.5, 2 min fwd, 2 min retro -Seated rows and extensions green 2X10  -Seated IR and ER with Green 2X10  -Bicep curl with 3#  2X10 -Seated shoulder flexion 2# 2X10 on Rt (3# 2X10 on Lt) -Seated shoulder abduction 2# X 10, 7,3 reps on Rt (3# 2X10 on Rt) -Horizontal abduction yellow 2X10 -D1 and D2 flexion 2# X 10 -Seated chest press green 2X10 (progress to blue next time) -Seated bilat shoulder flexion AAROM 3# bar 2X10   PATIENT EDUCATION: Education details: HEP, PT plan of care, post op precautions review Person educated: Patient Education method: Explanation, Demonstration, Verbal cues, and Handouts Education comprehension: verbalized understanding and needs further education   HOME EXERCISE PROGRAM: Access Code: [email or web address] URL: [email or web address] Date: 12/31/2021 Prepared by: Ivery Quale  Exercises - Standing Row with Anchored Resistance  - 2 x daily - 6 x weekly - 2-3 sets - 10-20 reps - Shoulder extension with resistance - Neutral  - 2 x daily - 6 x weekly - 2-3 sets - 10 reps - Shoulder External Rotation with Anchored Resistance  - 2 x daily - 6  x weekly - 2 sets - 10 reps - Shoulder Internal Rotation with Resistance  - 2 x daily - 6 x weekly - 2 sets - 10 reps - Standing Single Arm Elbow Flexion with Resistance  - 2 x daily - 6 x weekly - 2 sets - 10 reps - Seated Shoulder Flexion with Dumbbells  - 2 x daily - 6 x weekly - 1-2 sets - 10 reps - Seated Shoulder Abduction with Dumbbells - Thumbs Up  - 2 x daily - 6 x weekly - 1-2 sets - 10 reps  ASSESSMENT:  CLINICAL IMPRESSION: Continued to work on improving her shoulder strengthening and stabillity with good overall tolerance. She will benefit from this further to improve functional use of her Rt UE.  OBJECTIVE IMPAIRMENTS: decreased activity tolerance, decreased shoulder mobility, decreased ROM, decreased strength, impaired flexibility, impaired UE use, postural dysfunction, and pain.  ACTIVITY LIMITATIONS: reaching, lifting, carry,  cleaning, driving, and or occupation  PERSONAL FACTORS: PMH significant for Behcet's disease (on  Humira), RA, secondary adrenal insufficiency, OSA not on CPAP, mild reactive airway disease, pituitary adenoma, IDA, IBS, chronic diarrhea, chronic GERD, fat malabsorption and steroid induced osteoporosis.  also affecting patient's functional outcome.  REHAB POTENTIAL: Good  CLINICAL DECISION MAKING: Stable/uncomplicated  EVALUATION COMPLEXITY: Low    GOALS: Short term PT Goals Target date: 12/23/2021 Pt will be I and compliant with HEP. Baseline:  Goal status: MET Pt will decrease pain by 25% overall Baseline: Goal status: MET  Long term PT goals Target date: 02/17/2022 Pt will improve Rt shoulder AROM to [facility] to improve functional reaching Baseline: Goal status: ongoing Pt will improve  Rt shoulder strength to at least 4+/5 MMT to improve functional strength Baseline: Goal status: ongoing Pt will improve FOTO to at least 44% functional to show improved function Baseline: Goal status: ongoing Pt will reduce pain to overall less than 3/10 with usual activity and work activity. Baseline: Goal status: ongoing  PLAN: PT FREQUENCY: 1-3 times per week   PT DURATION: 12 weeks  PLANNED INTERVENTIONS (unless contraindicated): aquatic PT, Canalith repositioning, cryotherapy, Electrical stimulation, Iontophoresis with 4 mg/ml dexamethasome, Moist heat, traction, Ultrasound, gait training, Therapeutic exercise, balance training, neuromuscular re-education, patient/family education, prosthetic training, manual techniques, passive ROM, dry needling, taping, vasopnuematic device, vestibular, spinal manipulations, joint manipulations  PLAN FOR NEXT SESSION:  Progress strength as tolerated protocol is in patient folder summary is as follows: Week 1-6 (until 12/29/21) -sling for 2 weeks then may remove at home, continue to wear in community up to week 6 -avoid hand behind back -avoid >30 deg ER -No shoulder active elevation -No weight bearing on shoulder Week 6-12 (12/29/21 to  02/09/21) -discontinue sling -motion recovery IR without force -Begin active shoulder elevation -may begin ER at 90 deg abd to 60 deg Weight bearing on UE allowed

## 2022-01-21 ENCOUNTER — Encounter: Payer: Self-pay | Admitting: Physical Therapy

## 2022-01-21 ENCOUNTER — Ambulatory Visit: Payer: Medicare Other | Admitting: Physical Therapy

## 2022-01-21 DIAGNOSIS — M6281 Muscle weakness (generalized): Secondary | ICD-10-CM | POA: Diagnosis not present

## 2022-01-21 DIAGNOSIS — R600 Localized edema: Secondary | ICD-10-CM | POA: Diagnosis not present

## 2022-01-21 DIAGNOSIS — M25511 Pain in right shoulder: Secondary | ICD-10-CM | POA: Diagnosis not present

## 2022-01-21 NOTE — Therapy (Signed)
OUTPATIENT PHYSICAL THERAPY TREATMENT   Patient Name: [name] MRN: [id number] DOB:03-16-1960, 62 y.o., female Today's Date: 01/21/2022  END OF SESSION:  PT End of Session - 01/21/22 1422     Visit Number 7    Number of Visits 20    Date for PT Re-Evaluation 02/17/22    Authorization Type MCR    PT Start Time 1346    PT Stop Time 1424    PT Time Calculation (min) 38 min    Activity Tolerance Patient tolerated treatment well    Behavior During Therapy WFL for tasks assessed/performed                Past Medical History:  Diagnosis Date   Acid reflux    Adrenal insufficiency (HCC)    Anxiety    when she gets sick she gets anxious   Arrhythmia    Arthritis    inflammatory arthritis   Asthma    Behcet's syndrome (HCC)    Chronic back pain    Chronic neck pain    Colitis    Connective tissue disorder (HCC)    Depression    Dysrhythmia    POTS syndrome   Endometritis    Foot drop    due to muscle weakness from immunosuppressants   GERD (gastroesophageal reflux disease)    Gluten intolerance    Heart murmur    IBS (irritable bowel syndrome)    Myocardial infarction (HCC)    per pt, mild mi due to immunosuppressant therapy   Nasal ulcer    gets occasionally from medications   Osteoarthritis    Osteoporosis    T score reported -2.7   Pneumonia    20 years ago   PONV (postoperative nausea and vomiting)    also low blood pressure at times. do not use lactated ringers. must have sugar with saline d/t adrenal insufficiency   Refusal of blood transfusions as patient is Jehovah's Witness    Renal cyst 2019   Sickle cell trait (HCC)    Stroke (HCC) 2014   small peripheral stroke. Mononeuritis multiplex   Subjective visual disturbance 07/14/2012   Superficial phlebitis and thrombophlebitis of both lower extremities 2019   Tear of lateral meniscus of right knee, current    Tear of lateral & medial meniscus of right knee   Tinnitus    left ear  sees Dr Ezzard Standing (ENT)   Uveitis    Visual changes 2019   left eye vision only with distance, right eye has floater. vascular changes   Past Surgical History:  Procedure Laterality Date   ANTERIOR CERVICAL DECOMP/DISCECTOMY FUSION N/A 08/22/2013   Procedure: ANTERIOR CERVICAL DECOMPRESSION/DISCECTOMY FUSION 2 LEVELS;  Surgeon: Karn Cassis, MD;  Location: [facility];  Service: Neurosurgery;  Laterality: N/A;  C4-5 C5-6 Anterior cervical decompression/diskectomy/fusion   ANTERIOR FUSION CERVICAL SPINE  08/22/2013   c4 5  6          AXILLARY SURGERY     BACK SURGERY     BREAST EXCISIONAL BIOPSY Right 1986   BREAST SURGERY     Cysts excised   CARDIAC CATHETERIZATION  09/18/2013   CESAREAN SECTION  03/2001   x 1   CHOLECYSTECTOMY  12/2004   COLONOSCOPY  03/2013   DILATION AND CURETTAGE OF UTERUS     EXCISION OF BREAST LESION Right 08/18/2017   Procedure: EXCISION OF RIGHT AXILLARY SOFT TISSUE MASS;  Surgeon: Carolan Shiver, MD;  Location: [facility];  Service: General;  Laterality: Right;   FEMUR IM NAIL Left 04/02/2020   Procedure: INTRAMEDULLARY (IM) NAIL FEMORAL;  Surgeon: Sheral Apley, MD;  Location: [facility];  Service: Orthopedics;  Laterality: Left;   HAND SURGERY Right 07/2001   dupytren   HYSTEROSCOPY     IR SI JOINT INJ / ARTH LEFT W/IMAG GUIDE  2018   si fusion , left    IR SI JOINT INJ / ARTH RIGHT W/IMAG GUIDE Right 2018   JOINT REPLACEMENT Right 11/2016   partial knee replacement   KNEE ARTHROSCOPY WITH LATERAL MENISECTOMY Right 12/12/2012   Procedure: KNEE ARTHROSCOPY WITH LATERAL MENISECTOMY;  Surgeon: Nilda Simmer, MD;  Location: [facility];  Service: Orthopedics;  Laterality: Right;   KNEE SURGERY Left 05/1998   LEFT HEART CATHETERIZATION WITH CORONARY ANGIOGRAM N/A 09/18/2013   Procedure: LEFT HEART CATHETERIZATION WITH CORONARY ANGIOGRAM;  Surgeon: Micheline Chapman, MD;  Location: [facility];  Service: Cardiovascular;  Laterality: N/A;    LUMBAR DISC SURGERY     OOPHORECTOMY     BSO   PELVIC LAPAROSCOPY     DL   SPINE SURGERY     SUBMANDIBULAR MASS EXCISION     THYROID CYST EXCISION  1983   thyroglossal duct cyst   VAGINAL HYSTERECTOMY  07/2007   LAVH BSO   Patient Active Problem List   Diagnosis Date Noted   Pancreatic insufficiency 04/01/2020   Tremor 08/04/2019   Abnormal cardiovascular stress test 08/04/2019   Hypokalemia 02/15/2019   Chest pain 02/15/2019   Iatrogenic adrenal insufficiency (HCC) 01/15/2018   Orthostatic hypotension 12/07/2013   Adrenal insufficiency (HCC) 09/25/2013   Palpitations 09/16/2013   Cervical stenosis of spinal canal 08/22/2013   Lumbar degenerative disc disease 12/30/2012   Tear of lateral meniscus of right knee, current    Osteopenia    Lumbar back pain 09/13/2012   Subjective visual disturbance 07/14/2012   Endometriosis    Endometrial polyp    IBS (irritable bowel syndrome)    Acid reflux    Colitis    Behcet's syndrome (HCC)    Glucose intolerance (impaired glucose tolerance) 12/19/2010   Behcet's disease (HCC) 01/06/2004    PCP: Gardner Candle, MD   REFERRING PROVIDER: Ewing, Deitra Mayo   REFERRING DIAG: M19.011 (ICD-10-CM) - Primary osteoarthritis, right shoulder   THERAPY DIAG:  Acute pain of right shoulder  Muscle weakness (generalized)  Localized edema  Rationale for Evaluation and Treatment: Rehabilitation  ONSET DATE: Rt TSA with biceps tenodesis 11/17/21  SUBJECTIVE:  SUBJECTIVE STATEMENT: She says some pain in the front of her shoulder with ER activities  PERTINENT HISTORY: Rt TSA with biceps tenodesis 11/17/21  PAIN:  Are you having pain? Yes: NPRS scale: 0/10 Pain location: Rt shoulder around incision Pain description: ache, no N/T reported Aggravating  factors: movement of her Rt arm Relieving factors: rest, ice  PRECAUTIONS: Shoulder, see protocol in clinic folder  WEIGHT BEARING RESTRICTIONS: Non weight bearing on Rt shoulder until week 6  FALLS:  Has patient fallen in last 6 months? Yes. Number of falls 1   OCCUPATION:   PLOF: Independent with basic ADLs  PATIENT GOALS: improve use of her Rt arm  NEXT MD VISIT:   OBJECTIVE:   DIAGNOSTIC FINDINGS:    PATIENT SURVEYS:  FOTO 4% functional score at eval, goal is 44%  COGNITION: Overall cognitive status: Within functional limits for tasks assessed     SENSATION: WFL  POSTURE:   UPPER EXTREMITY ROM:   Passive ROM Right eval Right 01/07/22  Shoulder flexion 85 A:WNL   Shoulder extension    Shoulder abduction 60 A:WNL   Shoulder adduction    Shoulder internal rotation To belly Arm by side A:WNL   Shoulder external rotation 14 Arm by side A:WNL  Elbow flexion    Elbow extension    Wrist flexion    Wrist extension    Wrist ulnar deviation    Wrist radial deviation    Wrist pronation    Wrist supination    (Blank rows = not tested)  UPPER EXTREMITY MMT:  MMT  Eval Did not test due to post op status 01/07/22 Rt  Shoulder flexion  4  Shoulder extension    Shoulder abduction  4  Shoulder adduction    Shoulder internal rotation  4+  Shoulder external rotation  4-  Middle trapezius    Lower trapezius    Elbow flexion    Elbow extension    Wrist flexion    Wrist extension    Wrist ulnar deviation    Wrist radial deviation    Wrist pronation    Wrist supination    Grip strength (lbs)    (Blank rows = not tested)  SHOULDER SPECIAL TESTS:   JOINT MOBILITY TESTING:  Not tested today post op, Pt reports she has hypomobility at baseline  PALPATION:     TODAY'S TREATMENT:  01/21/22 -Pendulums x20 each direction -AAROM with red plyo ball up wall into flexion x10 hold 3 sec at top  -Seated rows and extensions green 2X15 each exercise -Bicep  curl with 3# 3X10 (one set each grip) -Seated shoulder flexion 3# bilat X10  -Seated shoulder abduction 3# bilat X 10 -Horizontal abduction yellow 2X10 -D1 and D2 flexion 2# X 10 each -Seated bilat shoulder flexion AAROM 3# bar 2X10  -Functional reach into cabinet with #3 x10 middle shelf, x5 top shelf  -Wall push ups 2x10 -Overhead stability ball rolls on wall with 2# weighted ball x15 each way  -UBE L3, 2.5 min fwd, 2.5 min retro  01/14/22 -UBE L3, 2.5 min fwd, 2.5 min retro -Seated rows and extensions green 2X10  -Seated IR and ER with Green 2X10  -Bicep curl with 3# 3X10 (one set each grip) -Seated shoulder flexion 2# 2X10 on Rt (3# 2X10 on Lt) -Seated shoulder abduction 2# X 10, 7,3 reps on Rt (3# 2X10 on Rt) -Horizontal abduction yellow 2X10 -D1 and D2 flexion 2# X 10 each -Seated chest press green 2X10 (progress to blue next  time) -Seated bilat shoulder flexion AAROM 3# bar 2X10  01/07/22 -UBE L2.5, 2 min fwd, 2 min retro -Seated rows and extensions green 2X10  -Seated IR and ER with Green 2X10  -Bicep curl with 3# 2X10 -Seated shoulder flexion 2# 2X10 on Rt (3# 2X10 on Lt) -Seated shoulder abduction 2# X 10, 7,3 reps on Rt (3# 2X10 on Rt) -Horizontal abduction yellow 2X10 -D1 and D2 flexion 2# X 10 -Seated chest press green 2X10 (progress to blue next time) -Seated bilat shoulder flexion AAROM 3# bar 2X10   PATIENT EDUCATION: Education details: HEP, PT plan of care, post op precautions review Person educated: Patient Education method: Explanation, Demonstration, Verbal cues, and Handouts Education comprehension: verbalized understanding and needs further education   HOME EXERCISE PROGRAM: Access Code: [email or web address] URL: [email or web address] Date: 12/31/2021 Prepared by: Ivery Quale  Exercises - Standing Row with Anchored Resistance  - 2 x daily - 6 x weekly - 2-3 sets - 10-20 reps - Shoulder extension with resistance - Neutral  - 2 x daily - 6 x  weekly - 2-3 sets - 10 reps - Shoulder External Rotation with Anchored Resistance  - 2 x daily - 6 x weekly - 2 sets - 10 reps - Shoulder Internal Rotation with Resistance  - 2 x daily - 6 x weekly - 2 sets - 10 reps - Standing Single Arm Elbow Flexion with Resistance  - 2 x daily - 6 x weekly - 2 sets - 10 reps - Seated Shoulder Flexion with Dumbbells  - 2 x daily - 6 x weekly - 1-2 sets - 10 reps - Seated Shoulder Abduction with Dumbbells - Thumbs Up  - 2 x daily - 6 x weekly - 1-2 sets - 10 reps  ASSESSMENT:  CLINICAL IMPRESSION: She had some pain with ER today so we held off on this exercise and she will work on isometrics for ER at home. She was able to progress flexion and abduction strength with good overall tolerance. PT recommending to continue with current plan with gradual strength progressions.   OBJECTIVE IMPAIRMENTS: decreased activity tolerance, decreased shoulder mobility, decreased ROM, decreased strength, impaired flexibility, impaired UE use, postural dysfunction, and pain.  ACTIVITY LIMITATIONS: reaching, lifting, carry,  cleaning, driving, and or occupation  PERSONAL FACTORS: PMH significant for Behcet's disease (on Humira), RA, secondary adrenal insufficiency, OSA not on CPAP, mild reactive airway disease, pituitary adenoma, IDA, IBS, chronic diarrhea, chronic GERD, fat malabsorption and steroid induced osteoporosis.  also affecting patient's functional outcome.  REHAB POTENTIAL: Good  CLINICAL DECISION MAKING: Stable/uncomplicated  EVALUATION COMPLEXITY: Low    GOALS: Short term PT Goals Target date: 12/23/2021 Pt will be I and compliant with HEP. Baseline:  Goal status: MET Pt will decrease pain by 25% overall Baseline: Goal status: MET  Long term PT goals Target date: 02/17/2022 Pt will improve Rt shoulder AROM to [facility] to improve functional reaching Baseline: Goal status: ongoing Pt will improve  Rt shoulder strength to at least 4+/5 MMT to improve  functional strength Baseline: Goal status: ongoing Pt will improve FOTO to at least 44% functional to show improved function Baseline: Goal status: ongoing Pt will reduce pain to overall less than 3/10 with usual activity and work activity. Baseline: Goal status: ongoing  PLAN: PT FREQUENCY: 1-3 times per week   PT DURATION: 12 weeks  PLANNED INTERVENTIONS (unless contraindicated): aquatic PT, Canalith repositioning, cryotherapy, Electrical stimulation, Iontophoresis with 4 mg/ml dexamethasome, Moist heat, traction, Ultrasound, gait training, Therapeutic exercise, balance training,  neuromuscular re-education, patient/family education, prosthetic training, manual techniques, passive ROM, dry needling, taping, vasopnuematic device, vestibular, spinal manipulations, joint manipulations  PLAN FOR NEXT SESSION:  Progress strength as tolerated, caution with ER as she had pain last time. protocol is in patient folder summary is as follows: Week 1-6 (until 12/29/21) -sling for 2 weeks then may remove at home, continue to wear in community up to week 6 -avoid hand behind back -avoid >30 deg ER -No shoulder active elevation -No weight bearing on shoulder Week 6-12 (12/29/21 to 02/09/21) -discontinue sling -motion recovery IR without force -Begin active shoulder elevation -may begin ER at 90 deg abd to 60 deg Weight bearing on UE allowed

## 2022-01-26 ENCOUNTER — Other Ambulatory Visit (HOSPITAL_COMMUNITY): Payer: Self-pay

## 2022-01-26 MED ORDER — LANSOPRAZOLE 30 MG PO CPDR
30.0000 mg | DELAYED_RELEASE_CAPSULE | Freq: Every day | ORAL | 3 refills | Status: DC
  Filled 2022-01-26: qty 90, 90d supply, fill #0
  Filled 2022-04-23: qty 90, 90d supply, fill #1
  Filled 2022-07-28: qty 90, 90d supply, fill #2

## 2022-01-27 ENCOUNTER — Other Ambulatory Visit: Payer: Self-pay

## 2022-01-27 ENCOUNTER — Other Ambulatory Visit (HOSPITAL_COMMUNITY): Payer: Self-pay

## 2022-01-27 MED ORDER — DULOXETINE HCL 60 MG PO CPEP
ORAL_CAPSULE | ORAL | 3 refills | Status: DC
  Filled 2022-01-27: qty 90, 90d supply, fill #0
  Filled 2022-04-23: qty 90, 90d supply, fill #1
  Filled 2022-07-28: qty 90, 90d supply, fill #2
  Filled 2022-08-05 – 2022-10-21 (×2): qty 90, 90d supply, fill #3

## 2022-01-28 ENCOUNTER — Encounter: Payer: Medicare Other | Admitting: Physical Therapy

## 2022-01-30 ENCOUNTER — Other Ambulatory Visit (HOSPITAL_COMMUNITY): Payer: Self-pay

## 2022-01-30 MED ORDER — PREDNISONE 10 MG PO TABS
ORAL_TABLET | ORAL | 0 refills | Status: DC
  Filled 2022-01-30: qty 15, 6d supply, fill #0

## 2022-01-31 ENCOUNTER — Other Ambulatory Visit (HOSPITAL_COMMUNITY): Payer: Self-pay

## 2022-02-04 ENCOUNTER — Encounter: Payer: Self-pay | Admitting: Physical Therapy

## 2022-02-04 ENCOUNTER — Ambulatory Visit: Payer: Medicare Other | Admitting: Physical Therapy

## 2022-02-04 DIAGNOSIS — R600 Localized edema: Secondary | ICD-10-CM | POA: Diagnosis not present

## 2022-02-04 DIAGNOSIS — M6281 Muscle weakness (generalized): Secondary | ICD-10-CM

## 2022-02-04 DIAGNOSIS — M25511 Pain in right shoulder: Secondary | ICD-10-CM

## 2022-02-04 NOTE — Therapy (Signed)
OUTPATIENT PHYSICAL THERAPY TREATMENT   Patient Name: [name] MRN: [id number] DOB:07/22/1960, 62 y.o., female Today's Date: 02/04/2022  END OF SESSION:  PT End of Session - 02/04/22 1508     Visit Number 8    Number of Visits 20    Date for PT Re-Evaluation 02/17/22    Authorization Type MCR    PT Start Time 1512    PT Stop Time 1554    PT Time Calculation (min) 42 min    Activity Tolerance Patient tolerated treatment well    Behavior During Therapy WFL for tasks assessed/performed                Past Medical History:  Diagnosis Date   Acid reflux    Adrenal insufficiency ([place])    Anxiety    when she gets sick she gets anxious   Arrhythmia    Arthritis    inflammatory arthritis   Asthma    Behcet's syndrome (HCC)    Chronic back pain    Chronic neck pain    Colitis    Connective tissue disorder (HCC)    Depression    Dysrhythmia    POTS syndrome   Endometritis    Foot drop    due to muscle weakness from immunosuppressants   GERD (gastroesophageal reflux disease)    Gluten intolerance    Heart murmur    IBS (irritable bowel syndrome)    Myocardial infarction ([place])    per pt, mild mi due to immunosuppressant therapy   Nasal ulcer    gets occasionally from medications   Osteoarthritis    Osteoporosis    T score reported -2.7   Pneumonia    20 years ago   PONV (postoperative nausea and vomiting)    also low blood pressure at times. do not use lactated ringers. must have sugar with saline d/t adrenal insufficiency   Refusal of blood transfusions as patient is Jehovah's Witness    Renal cyst 2019   Sickle cell trait ([place])    Stroke ([place]) 2014   small peripheral stroke. Mononeuritis multiplex   Subjective visual disturbance 07/14/2012   Superficial phlebitis and thrombophlebitis of both lower extremities 2019   Tear of lateral meniscus of right knee, current    Tear of lateral & medial meniscus of right knee   Tinnitus    left ear  sees Dr Lucia Gaskins (ENT)   Uveitis    Visual changes 2019   left eye vision only with distance, right eye has floater. vascular changes   Past Surgical History:  Procedure Laterality Date   ANTERIOR CERVICAL DECOMP/DISCECTOMY FUSION N/A 08/22/2013   Procedure: ANTERIOR CERVICAL DECOMPRESSION/DISCECTOMY FUSION 2 LEVELS;  Surgeon: Floyce Stakes, MD;  Location: [facility];  Service: Neurosurgery;  Laterality: N/A;  C4-5 C5-6 Anterior cervical decompression/diskectomy/fusion   ANTERIOR FUSION CERVICAL SPINE  08/22/2013   c4 5  6          AXILLARY SURGERY     BACK SURGERY     BREAST EXCISIONAL BIOPSY Right 1986   BREAST SURGERY     Cysts excised   CARDIAC CATHETERIZATION  09/18/2013   CESAREAN SECTION  03/2001   x 1   CHOLECYSTECTOMY  12/2004   COLONOSCOPY  03/2013   DILATION AND CURETTAGE OF UTERUS     EXCISION OF BREAST LESION Right 08/18/2017   Procedure: EXCISION OF RIGHT AXILLARY SOFT TISSUE MASS;  Surgeon: Herbert Pun, MD;  Location: [facility];  Service: General;  Laterality: Right;   FEMUR IM NAIL Left 04/02/2020   Procedure: INTRAMEDULLARY (IM) NAIL FEMORAL;  Surgeon: Sheral Apley, MD;  Location: [facility];  Service: Orthopedics;  Laterality: Left;   HAND SURGERY Right 07/2001   dupytren   HYSTEROSCOPY     IR SI JOINT INJ / ARTH LEFT W/IMAG GUIDE  2018   si fusion , left    IR SI JOINT INJ / ARTH RIGHT W/IMAG GUIDE Right 2018   JOINT REPLACEMENT Right 11/2016   partial knee replacement   KNEE ARTHROSCOPY WITH LATERAL MENISECTOMY Right 12/12/2012   Procedure: KNEE ARTHROSCOPY WITH LATERAL MENISECTOMY;  Surgeon: Nilda Simmer, MD;  Location: [facility];  Service: Orthopedics;  Laterality: Right;   KNEE SURGERY Left 05/1998   LEFT HEART CATHETERIZATION WITH CORONARY ANGIOGRAM N/A 09/18/2013   Procedure: LEFT HEART CATHETERIZATION WITH CORONARY ANGIOGRAM;  Surgeon: Micheline Chapman, MD;  Location: [facility];  Service: Cardiovascular;  Laterality: N/A;    LUMBAR DISC SURGERY     OOPHORECTOMY     BSO   PELVIC LAPAROSCOPY     DL   SPINE SURGERY     SUBMANDIBULAR MASS EXCISION     THYROID CYST EXCISION  1983   thyroglossal duct cyst   VAGINAL HYSTERECTOMY  07/2007   [facility] BSO   Patient Active Problem List   Diagnosis Date Noted   Pancreatic insufficiency 04/01/2020   Tremor 08/04/2019   Abnormal cardiovascular stress test 08/04/2019   Hypokalemia 02/15/2019   Chest pain 02/15/2019   Iatrogenic adrenal insufficiency (HCC) 01/15/2018   Orthostatic hypotension 12/07/2013   Adrenal insufficiency (HCC) 09/25/2013   Palpitations 09/16/2013   Cervical stenosis of spinal canal 08/22/2013   Lumbar degenerative disc disease 12/30/2012   Tear of lateral meniscus of right knee, current    Osteopenia    Lumbar back pain 09/13/2012   Subjective visual disturbance 07/14/2012   Endometriosis    Endometrial polyp    IBS (irritable bowel syndrome)    Acid reflux    Colitis    Behcet's syndrome (HCC)    Glucose intolerance (impaired glucose tolerance) 12/19/2010   Behcet's disease (HCC) 01/06/2004    PCP: Gardner Candle, MD   REFERRING PROVIDER: Ewing, Deitra Mayo   REFERRING DIAG: M19.011 (ICD-10-CM) - Primary osteoarthritis, right shoulder   THERAPY DIAG:  Acute pain of right shoulder  Muscle weakness (generalized)  Localized edema  Rationale for Evaluation and Treatment: Rehabilitation  ONSET DATE: Rt TSA with biceps tenodesis 11/17/21  SUBJECTIVE:  SUBJECTIVE STATEMENT: She says some spasms lately in her shoulder but pain is doing well. She says MD was happy with her progress.   PERTINENT HISTORY: Rt TSA with biceps tenodesis 11/17/21  PAIN:  Are you having pain? Yes: NPRS scale: 0/10 Pain location: Rt shoulder around incision Pain  description: ache, no N/T reported Aggravating factors: movement of her Rt arm Relieving factors: rest, ice  PRECAUTIONS: Shoulder, see protocol in clinic folder  WEIGHT BEARING RESTRICTIONS: Non weight bearing on Rt shoulder until week 6  FALLS:  Has patient fallen in last 6 months? Yes. Number of falls 1   OCCUPATION:   PLOF: Independent with basic ADLs  PATIENT GOALS: improve use of her Rt arm  NEXT MD VISIT:   OBJECTIVE:   DIAGNOSTIC FINDINGS:    PATIENT SURVEYS:  FOTO 4% functional score at eval, goal is 44% 02/04/22: FOTO 59% and met goal  COGNITION: Overall cognitive status: Within functional limits for tasks assessed     SENSATION: WFL  POSTURE:   UPPER EXTREMITY ROM:   Passive ROM Right eval Right 01/07/22  Shoulder flexion 85 A:WNL   Shoulder extension    Shoulder abduction 60 A:WNL   Shoulder adduction    Shoulder internal rotation To belly Arm by side A:WNL   Shoulder external rotation 14 Arm by side A:WNL  Elbow flexion    Elbow extension    Wrist flexion    Wrist extension    Wrist ulnar deviation    Wrist radial deviation    Wrist pronation    Wrist supination    (Blank rows = not tested)  UPPER EXTREMITY MMT:  MMT  Eval Did not test due to post op status 01/07/22 Rt   Shoulder flexion  4   Shoulder extension     Shoulder abduction  4   Shoulder adduction     Shoulder internal rotation  4+   Shoulder external rotation  4-   Middle trapezius     Lower trapezius     Elbow flexion     Elbow extension     Wrist flexion     Wrist extension     Wrist ulnar deviation     Wrist radial deviation     Wrist pronation     Wrist supination     Grip strength (lbs)     (Blank rows = not tested)  SHOULDER SPECIAL TESTS:   JOINT MOBILITY TESTING:  Not tested today post op, Pt reports she has hypomobility at baseline  PALPATION:     TODAY'S TREATMENT:  02/04/22 -Seated pball roll outs into lumbar/shoulder flexion 5 sec X  10 -AAROM with red plyo ball up wall into flexion x10 hold 5 sec at top  -Seated rows and extensions green 2X15 each exercise -Bicep curl with 4# 2X10 (one supinated, one set neutral grip) -bilat shoulder rows and extensions with green X 20 each -D1 and D2 flexion 2# X 10 each -Seated bilat ER with 4# 2 X10 -Seated bilat shoulder flexion AAROM 3# bar 2X10  -Functional reach into cabinet flexion with #3 2x5 top shelf  -Functional reach into cabinet scaption with 3# 2X5 top shelf -Wall push ups 2x10  01/21/22 -Pendulums x20 each direction -AAROM with red plyo ball up wall into flexion x10 hold 3 sec at top  -Seated rows and extensions green 2X15 each exercise -Bicep curl with 3# 3X10 (one set each grip) -Seated shoulder flexion 3# bilat X10  -Seated shoulder abduction 3# bilat X 10 -  Horizontal abduction yellow 2X10 -D1 and D2 flexion 2# X 10 each -Seated bilat shoulder flexion AAROM 3# bar 2X10  -Functional reach into cabinet with #3 x10 middle shelf, x5 top shelf  -Wall push ups 2x10 -Overhead stability ball rolls on wall with 2# weighted ball x15 each way  -UBE L3, 2.5 min fwd, 2.5 min retro  01/14/22 -UBE L3, 2.5 min fwd, 2.5 min retro -Seated rows and extensions green 2X10  -Seated IR and ER with Green 2X10  -Bicep curl with 3# 3X10 (one set each grip) -Seated shoulder flexion 2# 2X10 on Rt (3# 2X10 on Lt) -Seated shoulder abduction 2# X 10, 7,3 reps on Rt (3# 2X10 on Rt) -Horizontal abduction yellow 2X10 -D1 and D2 flexion 2# X 10 each -Seated chest press green 2X10 (progress to blue next time) -Seated bilat shoulder flexion AAROM 3# bar 2X10   PATIENT EDUCATION: Education details: HEP, PT plan of care, post op precautions review Person educated: Patient Education method: Explanation, Demonstration, Verbal cues, and Handouts Education comprehension: verbalized understanding and needs further education   HOME EXERCISE PROGRAM: Access Code: [email or web address] URL:  [email or web address] Date: 12/31/2021 Prepared by: Elsie Ra  Exercises - Standing Row with Anchored Resistance  - 2 x daily - 6 x weekly - 2-3 sets - 10-20 reps - Shoulder extension with resistance - Neutral  - 2 x daily - 6 x weekly - 2-3 sets - 10 reps - Shoulder External Rotation with Anchored Resistance  - 2 x daily - 6 x weekly - 2 sets - 10 reps - Shoulder Internal Rotation with Resistance  - 2 x daily - 6 x weekly - 2 sets - 10 reps - Standing Single Arm Elbow Flexion with Resistance  - 2 x daily - 6 x weekly - 2 sets - 10 reps - Seated Shoulder Flexion with Dumbbells  - 2 x daily - 6 x weekly - 1-2 sets - 10 reps - Seated Shoulder Abduction with Dumbbells - Thumbs Up  - 2 x daily - 6 x weekly - 1-2 sets - 10 reps  ASSESSMENT:  CLINICAL IMPRESSION: She is progressing well with her strength and pain levels. Her ROM is WNL. PT recommending a few more sessions to maximize her strength and functional use of her Rt shoulder.   OBJECTIVE IMPAIRMENTS: decreased activity tolerance, decreased shoulder mobility, decreased ROM, decreased strength, impaired flexibility, impaired UE use, postural dysfunction, and pain.  ACTIVITY LIMITATIONS: reaching, lifting, carry,  cleaning, driving, and or occupation  PERSONAL FACTORS: PMH significant for Behcet's disease (on Humira), RA, secondary adrenal insufficiency, OSA not on CPAP, mild reactive airway disease, pituitary adenoma, IDA, IBS, chronic diarrhea, chronic GERD, fat malabsorption and steroid induced osteoporosis.  also affecting patient's functional outcome.  REHAB POTENTIAL: Good  CLINICAL DECISION MAKING: Stable/uncomplicated  EVALUATION COMPLEXITY: Low    GOALS: Short term PT Goals Target date: 12/23/2021 Pt will be I and compliant with HEP. Baseline:  Goal status: MET Pt will decrease pain by 25% overall Baseline: Goal status: MET  Long term PT goals Target date: 02/17/2022 Pt will improve Rt shoulder AROM  to [facility] to improve functional reaching Baseline: Goal status: MET 01/07/22 Pt will improve  Rt shoulder strength to at least 4+/5 MMT to improve functional strength Baseline: Goal status: ongoing Pt will improve FOTO to at least 44% functional to show improved function Baseline: Goal status: MET 02/04/22 with 59% Pt will reduce pain to overall less than 3/10 with usual activity and work activity. Baseline: Goal  status: ongoing  PLAN: PT FREQUENCY: 1-3 times per week   PT DURATION: 12 weeks  PLANNED INTERVENTIONS (unless contraindicated): aquatic PT, Canalith repositioning, cryotherapy, Electrical stimulation, Iontophoresis with 4 mg/ml dexamethasome, Moist heat, traction, Ultrasound, gait training, Therapeutic exercise, balance training, neuromuscular re-education, patient/family education, prosthetic training, manual techniques, passive ROM, dry needling, taping, vasopnuematic device, vestibular, spinal manipulations, joint manipulations  PLAN FOR NEXT SESSION:  Progress strength as tolerated and transition to independent program.   protocol is in patient folder summary is as follows: Week 1-6 (until 12/29/21) -sling for 2 weeks then may remove at home, continue to wear in community up to week 6 -avoid hand behind back -avoid >30 deg ER -No shoulder active elevation -No weight bearing on shoulder Week 6-12 (12/29/21 to 02/09/21) -discontinue sling -motion recovery IR without force -Begin active shoulder elevation -may begin ER at 90 deg abd to 60 deg Weight bearing on UE allowed

## 2022-02-06 ENCOUNTER — Other Ambulatory Visit: Payer: Self-pay | Admitting: Cardiovascular Disease

## 2022-02-06 ENCOUNTER — Other Ambulatory Visit (HOSPITAL_COMMUNITY): Payer: Self-pay

## 2022-02-10 ENCOUNTER — Other Ambulatory Visit (HOSPITAL_COMMUNITY): Payer: Self-pay

## 2022-02-10 ENCOUNTER — Encounter: Payer: Self-pay | Admitting: Cardiovascular Disease

## 2022-02-11 ENCOUNTER — Encounter: Payer: Medicare Other | Admitting: Physical Therapy

## 2022-02-11 ENCOUNTER — Other Ambulatory Visit (HOSPITAL_COMMUNITY): Payer: Self-pay

## 2022-02-11 ENCOUNTER — Other Ambulatory Visit: Payer: Self-pay

## 2022-02-11 MED ORDER — PROPRANOLOL HCL 80 MG PO TABS
80.0000 mg | ORAL_TABLET | Freq: Two times a day (BID) | ORAL | 3 refills | Status: DC
  Filled 2022-02-11: qty 30, 15d supply, fill #0
  Filled 2022-02-11: qty 150, 75d supply, fill #0
  Filled 2022-05-07: qty 180, 90d supply, fill #1
  Filled 2022-08-04 – 2022-08-05 (×2): qty 180, 90d supply, fill #2
  Filled 2022-11-03: qty 180, 90d supply, fill #3

## 2022-02-11 MED ORDER — PROPRANOLOL HCL 80 MG PO TABS: 80.0000 mg | ORAL_TABLET | Freq: Two times a day (BID) | ORAL | 3 refills | Status: DC

## 2022-02-11 NOTE — Telephone Encounter (Signed)
I just saw this patient in clinic a month ago.  Of course she can get this refill.  Please refill for a year.

## 2022-02-12 ENCOUNTER — Other Ambulatory Visit (HOSPITAL_COMMUNITY): Payer: Self-pay

## 2022-02-12 ENCOUNTER — Other Ambulatory Visit: Payer: Self-pay

## 2022-02-13 ENCOUNTER — Other Ambulatory Visit (HOSPITAL_COMMUNITY): Payer: Self-pay

## 2022-02-13 MED ORDER — FLUCONAZOLE 200 MG PO TABS
ORAL_TABLET | ORAL | 0 refills | Status: AC
  Filled 2022-02-13: qty 22, 21d supply, fill #0

## 2022-02-18 ENCOUNTER — Encounter: Payer: Self-pay | Admitting: Physical Therapy

## 2022-02-18 ENCOUNTER — Ambulatory Visit: Payer: Medicare Other | Admitting: Physical Therapy

## 2022-02-18 DIAGNOSIS — R600 Localized edema: Secondary | ICD-10-CM

## 2022-02-18 DIAGNOSIS — M25511 Pain in right shoulder: Secondary | ICD-10-CM | POA: Diagnosis not present

## 2022-02-18 DIAGNOSIS — M6281 Muscle weakness (generalized): Secondary | ICD-10-CM | POA: Diagnosis not present

## 2022-02-18 NOTE — Therapy (Signed)
OUTPATIENT PHYSICAL THERAPY TREATMENT/DISCHARGE   PHYSICAL THERAPY DISCHARGE SUMMARY  Visits from Start of Care:   Current functional level related to goals / functional outcomes: See below   Remaining deficits: See below   Education / Equipment: HEP  Plan:  Patient goals were met. Patient is being discharged due to meeting all her goals and feeling ready to transition to independent program.     Patient Name: Arybella Lemmo Cobb-Dean MRN: [id number] DOB:[birth year], 62 y.o., female Today's Date: 02/18/2022  END OF SESSION:  PT End of Session - 02/18/22 1351     Visit Number 9    Number of Visits 20    Date for PT Re-Evaluation 02/17/22    Authorization Type MCR    PT Start Time 1346    PT Stop Time 1420    PT Time Calculation (min) 34 min    Activity Tolerance Patient tolerated treatment well    Behavior During Therapy WFL for tasks assessed/performed                Past Medical History:  Diagnosis Date   Acid reflux    Adrenal insufficiency ([place])    Anxiety    when she gets sick she gets anxious   Arrhythmia    Arthritis    inflammatory arthritis   Asthma    Behcet's syndrome (HCC)    Chronic back pain    Chronic neck pain    Colitis    Connective tissue disorder (HCC)    Depression    Dysrhythmia    POTS syndrome   Endometritis    Foot drop    due to muscle weakness from immunosuppressants   GERD (gastroesophageal reflux disease)    Gluten intolerance    Heart murmur    IBS (irritable bowel syndrome)    Myocardial infarction ([place])    per pt, mild mi due to immunosuppressant therapy   Nasal ulcer    gets occasionally from medications   Osteoarthritis    Osteoporosis    T score reported -2.7   Pneumonia    20 years ago   PONV (postoperative nausea and vomiting)    also low blood pressure at times. do not use lactated ringers. must have sugar with saline d/t adrenal insufficiency   Refusal of blood transfusions as patient is [employer]hov[employer]'s  Witness    Renal cyst 2019   Sickle cell trait ([place])    Stroke ([place]) 2014   small peripheral stroke. Mononeuritis multiplex   Subjective visual disturbance 07/14/2012   Superficial phlebitis and thrombophlebitis of both lower extremities 2019   Tear of lateral meniscus of right knee, current    Tear of lateral & medial meniscus of right knee   Tinnitus    left ear sees Dr Lucia Gaskins (ENT)   Uveitis    Visual changes 2019   left eye vision only with distance, right eye has floater. vascular changes   Past Surgical History:  Procedure Laterality Date   ANTERIOR CERVICAL DECOMP/DISCECTOMY FUSION N/A 08/22/2013   Procedure: ANTERIOR CERVICAL DECOMPRESSION/DISCECTOMY FUSION 2 LEVELS;  Surgeon: Floyce Stakes, MD;  Location: [facility];  Service: Neurosurgery;  Laterality: N/A;  C4-5 C5-6 Anterior cervical decompression/diskectomy/fusion   ANTERIOR FUSION CERVICAL SPINE  08/22/2013   c4 5  6          AXILLARY SURGERY     BACK SURGERY     BREAST EXCISIONAL BIOPSY Right 1986   BREAST SURGERY     Cysts excised  CARDIAC CATHETERIZATION  09/18/2013   CESAREAN SECTION  03/2001   x 1   CHOLECYSTECTOMY  12/2004   COLONOSCOPY  03/2013   DILATION AND CURETTAGE OF UTERUS     EXCISION OF BREAST LESION Right 08/18/2017   Procedure: EXCISION OF RIGHT AXILLARY SOFT TISSUE MASS;  Surgeon: Herbert Pun, MD;  Location: [facility];  Service: General;  Laterality: Right;   FEMUR IM NAIL Left 04/02/2020   Procedure: INTRAMEDULLARY (IM) NAIL FEMORAL;  Surgeon: Renette Butters, MD;  Location: [facility];  Service: Orthopedics;  Laterality: Left;   HAND SURGERY Right 07/2001   dupytren   HYSTEROSCOPY     IR SI JOINT INJ / ARTH LEFT W/IMAG GUIDE  2018   si fusion , left    IR SI JOINT INJ / ARTH RIGHT W/IMAG GUIDE Right 2018   JOINT REPLACEMENT Right 11/2016   partial knee replacement   KNEE ARTHROSCOPY WITH LATERAL MENISECTOMY Right 12/12/2012   Procedure: KNEE ARTHROSCOPY WITH LATERAL MENISECTOMY;   Surgeon: Lorn Junes, MD;  Location: [facility];  Service: Orthopedics;  Laterality: Right;   KNEE SURGERY Left 05/1998   LEFT HEART CATHETERIZATION WITH CORONARY ANGIOGRAM N/A 09/18/2013   Procedure: LEFT HEART CATHETERIZATION WITH CORONARY ANGIOGRAM;  Surgeon: Blane Ohara, MD;  Location: [facility] C[facility];  Service: Cardiovascular;  Laterality: N/A;   LUMBAR DISC SURGERY     OOPHORECTOMY     BSO   PELVIC LAPAROSCOPY     DL   SPINE SURGERY     SUBMANDIBULAR MASS EXCISION     THYROID CYST EXCISION  1983   thyroglossal duct cyst   VAGINAL HYSTERECTOMY  07/2007   LAVH BSO   Patient Active Problem List   Diagnosis Date Noted   Pancreatic insufficiency 04/01/2020   Tremor 08/04/2019   Abnormal cardiovascular stress test 08/04/2019   Hypokalemia 02/15/2019   Chest pain 02/15/2019   Iatrogenic adrenal insufficiency ([place]) 01/15/2018   Orthostatic hypotension 12/07/2013   Adrenal insufficiency ([place]) 09/25/2013   Palpitations 09/16/2013   Cervical stenosis of spinal canal 08/22/2013   Lumbar degenerative disc disease 12/30/2012   Tear of lateral meniscus of right knee, current    Osteopenia    Lumbar back pain 09/13/2012   Subjective visual disturbance 07/14/2012   Endometriosis    Endometrial polyp    IBS (irritable bowel syndrome)    Acid reflux    Colitis    Behcet's syndrome (HCC)    Glucose intolerance (impaired glucose tolerance) 12/19/2010   Behcet's disease ([place]) 01/06/2004    PCP: Evalee Mutton, MD   REFERRING PROVIDER: Ewing, Sharyn Blitz   REFERRING DIAG: M19.011 (ICD-10-CM) - Primary osteoarthritis, right shoulder   THERAPY DIAG:  Acute pain of right shoulder  Muscle weakness (generalized)  Localized edema  Rationale for Evaluation and Treatment: Rehabilitation  ONSET DATE: Rt TSA with biceps tenodesis 11/17/21  SUBJECTIVE:  SUBJECTIVE STATEMENT: She said her shoulder is doing well.    PERTINENT HISTORY: Rt TSA with biceps tenodesis 11/17/21  PAIN:  Are you having pain? Yes: NPRS scale: 0/10 Pain location: Rt shoulder around incision Pain description: ache, no N/T reported Aggravating factors: movement of her Rt arm Relieving factors: rest, ice  PRECAUTIONS: Shoulder, see protocol in clinic folder  WEIGHT BEARING RESTRICTIONS: Non weight bearing on Rt shoulder until week 6  FALLS:  Has patient fallen in last 6 months? Yes. Number of falls 1   OCCUPATION:   PLOF: Independent with basic ADLs  PATIENT GOALS: improve use of her Rt arm  NEXT MD VISIT:   OBJECTIVE:   DIAGNOSTIC FINDINGS:    PATIENT SURVEYS:  FOTO 4% functional score at eval, goal is 44% 02/04/22: FOTO 59% and met goal  COGNITION: Overall cognitive status: Within functional limits for tasks assessed     SENSATION: WFL  POSTURE:   UPPER EXTREMITY ROM:   Passive ROM Right eval Right 01/07/22  Shoulder flexion 85 A:WNL   Shoulder extension    Shoulder abduction 60 A:WNL   Shoulder adduction    Shoulder internal rotation To belly Arm by side A:WNL   Shoulder external rotation 14 Arm by side A:WNL  Elbow flexion    Elbow extension    Wrist flexion    Wrist extension    Wrist ulnar deviation    Wrist radial deviation    Wrist pronation    Wrist supination    (Blank rows = not tested)  UPPER EXTREMITY MMT:  MMT  Eval Did not test due to post op status 01/07/22 Rt 02/18/22  Shoulder flexion  4 4+  Shoulder extension     Shoulder abduction  4 5  Shoulder adduction     Shoulder internal rotation  4+ 5  Shoulder external rotation  4- 4+  Middle trapezius     Lower trapezius     Elbow flexion     Elbow extension     Wrist flexion     Wrist extension     Wrist ulnar deviation     Wrist radial deviation     Wrist pronation     Wrist  supination     Grip strength (lbs)     (Blank rows = not tested)  SHOULDER SPECIAL TESTS:   JOINT MOBILITY TESTING:  Not tested today post op, Pt reports she has hypomobility at baseline  PALPATION:     TODAY'S TREATMENT:  02/18/22 -pulleys flexion x92mn, abduction x215m -rows with green band 2x12 -shoulder extenison with green band 2x12 -shoulder IR with green band 2x12 -shouler ER with green band 2x12 -Bicep curl with 4# 2X12 supinated grip  -D1 and D2 flexion 2# X 25 each -Seated bilat shoulder flexion 2# weight 2X12 -Seated bilat abduction 2# weight 2x12  02/04/22 -Seated pball roll outs into lumbar/shoulder flexion 5 sec X 10 -AAROM with red plyo ball up wall into flexion x10 hold 5 sec at top  -Seated rows and extensions green 2X15 each exercise -Bicep curl with 4# 2X10 (one supinated, one set neutral grip) -bilat shoulder rows and extensions with green X 20 each -D1 and D2 flexion 2# X 10 each -Seated bilat ER with 4# 2 X10 -Seated bilat shoulder flexion AAROM 3# bar 2X10  -Functional reach into cabinet flexion with #3 2x5 top shelf  -Functional reach into cabinet scaption with 3# 2X5 top shelf -Wall push ups 2x10  01/21/22 -Pendulums x20 each direction -AAROM with  red plyo ball up wall into flexion x10 hold 3 sec at top  -Seated rows and extensions green 2X15 each exercise -Bicep curl with 3# 3X10 (one set each grip) -Seated shoulder flexion 3# bilat X10  -Seated shoulder abduction 3# bilat X 10 -Horizontal abduction yellow 2X10 -D1 and D2 flexion 2# X 10 each -Seated bilat shoulder flexion AAROM 3# bar 2X10  -Functional reach into cabinet with #3 x10 middle shelf, x5 top shelf  -Wall push ups 2x10 -Overhead stability ball rolls on wall with 2# weighted ball x15 each way  -UBE L3, 2.5 min fwd, 2.5 min retro  01/14/22 -UBE L3, 2.5 min fwd, 2.5 min retro -Seated rows and extensions green 2X10  -Seated IR and ER with Green 2X10  -Bicep curl with 3# 3X10 (one  set each grip) -Seated shoulder flexion 2# 2X10 on Rt (3# 2X10 on Lt) -Seated shoulder abduction 2# X 10, 7,3 reps on Rt (3# 2X10 on Rt) -Horizontal abduction yellow 2X10 -D1 and D2 flexion 2# X 10 each -Seated chest press green 2X10 (progress to blue next time) -Seated bilat shoulder flexion AAROM 3# bar 2X10   PATIENT EDUCATION: Education details: HEP, PT plan of care, post op precautions review Person educated: Patient Education method: Explanation, Demonstration, Verbal cues, and Handouts Education comprehension: verbalized understanding and needs further education   HOME EXERCISE PROGRAM: Access Code: [email or web address] URL: [email or web address] Date: 02/18/2022 Prepared by: Elsie Ra  Exercises - Standing Row with Anchored Resistance  - 1 x daily - 3 x weekly - 2 sets - 10-20 reps - Shoulder extension with resistance - Neutral  - 1 x daily - 3 x weekly - 2 sets - 10-20 reps - Shoulder External Rotation with Anchored Resistance  - 1 x daily - 3 x weekly - 2 sets - 10 reps - Shoulder Internal Rotation with Resistance  - 1 x daily - 3 x weekly - 2 sets - 10 reps - Standing Single Arm Elbow Flexion with Resistance  - 1 x daily - 3 x weekly - 2 sets - 10 reps - Seated Shoulder Flexion with Dumbbells  - 1 x daily - 3 x weekly - 1-2 sets - 10 reps - Seated Shoulder Abduction with Dumbbells - Thumbs Up  - 1 x daily - 3 x weekly - 1-2 sets - 10 reps - Seated Single Arm Shoulder PNF D1 Flexion  - 1 x daily - 3 x weekly - 1-2 sets - 10 reps - Shoulder PNF D2 with Flex Bar (Mirrored)  - 1 x daily - 3 x weekly - 2 sets - 10 reps  ASSESSMENT:  CLINICAL IMPRESSION: She has progressed very well meeting all her goals. Her strength and range is well within functional limits and she reports very little pain with her shoulder. She felt ready to discharge to independent program and her HEP was updated to reflect improvements and areas that can use some work on her own.   OBJECTIVE  IMPAIRMENTS: decreased activity tolerance, decreased shoulder mobility, decreased ROM, decreased strength, impaired flexibility, impaired UE use, postural dysfunction, and pain.  ACTIVITY LIMITATIONS: reaching, lifting, carry,  cleaning, driving, and or occupation  PERSONAL FACTORS: PMH significant for Behcet's disease (on Humira), RA, secondary adrenal insufficiency, OSA not on CPAP, mild reactive airway disease, pituitary adenoma, IDA, IBS, chronic diarrhea, chronic GERD, fat malabsorption and steroid induced osteoporosis.  also affecting patient's functional outcome.  REHAB POTENTIAL: Good  CLINICAL DECISION MAKING: Stable/uncomplicated  EVALUATION COMPLEXITY: Low    GOALS:  Short term PT Goals Target date: 12/23/2021 Pt will be I and compliant with HEP. Baseline:  Goal status: MET Pt will decrease pain by 25% overall Baseline: Goal status: MET  Long term PT goals Target date: 02/17/2022 Pt will improve Rt shoulder AROM to [facility] to improve functional reaching Baseline: Goal status: MET 01/07/22 Pt will improve  Rt shoulder strength to at least 4+/5 MMT to improve functional strength Baseline: Goal status: MET 02/18/22 Pt will improve FOTO to at least 44% functional to show improved function Baseline: Goal status: MET 02/04/22 with 59% Pt will reduce pain to overall less than 3/10 with usual activity and work activity. Baseline: Goal status: MET 02/18/22  PLAN: PT FREQUENCY: 1-3 times per week   PT DURATION: 12 weeks  PLANNED INTERVENTIONS (unless contraindicated): aquatic PT, Canalith repositioning, cryotherapy, Electrical stimulation, Iontophoresis with 4 mg/ml dexamethasome, Moist heat, traction, Ultrasound, gait training, Therapeutic exercise, balance training, neuromuscular re-education, patient/family education, prosthetic training, manual techniques, passive ROM, dry needling, taping, vasopnuematic device, vestibular, spinal manipulations, joint manipulations  PLAN FOR  NEXT SESSION:  Discharged to independent program.   protocol is in patient folder summary is as follows: Week 1-6 (until 12/29/21) -sling for 2 weeks then may remove at home, continue to wear in community up to week 6 -avoid hand behind back -avoid >30 deg ER -No shoulder active elevation -No weight bearing on shoulder Week 6-12 (12/29/21 to 02/09/21) -discontinue sling -motion recovery IR without force -Begin active shoulder elevation -may begin ER at 90 deg abd to 60 deg Weight bearing on UE allowed

## 2022-02-25 ENCOUNTER — Other Ambulatory Visit (HOSPITAL_COMMUNITY): Payer: Self-pay

## 2022-03-07 ENCOUNTER — Other Ambulatory Visit (HOSPITAL_COMMUNITY): Payer: Self-pay

## 2022-03-15 ENCOUNTER — Other Ambulatory Visit (HOSPITAL_COMMUNITY): Payer: Self-pay

## 2022-03-16 ENCOUNTER — Other Ambulatory Visit: Payer: Self-pay

## 2022-04-02 ENCOUNTER — Other Ambulatory Visit (HOSPITAL_COMMUNITY): Payer: Self-pay

## 2022-04-02 ENCOUNTER — Other Ambulatory Visit: Payer: Self-pay

## 2022-04-02 MED ORDER — ALBUTEROL SULFATE HFA 108 (90 BASE) MCG/ACT IN AERS
INHALATION_SPRAY | RESPIRATORY_TRACT | 2 refills | Status: DC
  Filled 2022-04-02: qty 6.7, 25d supply, fill #0
  Filled 2023-02-02: qty 6.7, 25d supply, fill #1
  Filled 2023-02-23: qty 6.7, 25d supply, fill #2

## 2022-04-03 ENCOUNTER — Other Ambulatory Visit (HOSPITAL_COMMUNITY): Payer: Self-pay

## 2022-04-09 ENCOUNTER — Other Ambulatory Visit (HOSPITAL_COMMUNITY): Payer: Self-pay

## 2022-04-09 MED ORDER — FLUCONAZOLE 200 MG PO TABS
ORAL_TABLET | ORAL | 0 refills | Status: DC
  Filled 2022-04-09: qty 20, 5d supply, fill #0
  Filled 2022-04-09: qty 56, 14d supply, fill #0
  Filled 2022-04-10: qty 36, 9d supply, fill #0

## 2022-04-10 ENCOUNTER — Other Ambulatory Visit (HOSPITAL_COMMUNITY): Payer: Self-pay

## 2022-04-10 ENCOUNTER — Other Ambulatory Visit: Payer: Self-pay

## 2022-04-13 ENCOUNTER — Other Ambulatory Visit: Payer: Self-pay

## 2022-04-13 ENCOUNTER — Other Ambulatory Visit (HOSPITAL_COMMUNITY): Payer: Self-pay

## 2022-04-22 ENCOUNTER — Other Ambulatory Visit: Payer: Self-pay | Admitting: Orthopedic Surgery

## 2022-04-22 DIAGNOSIS — M25552 Pain in left hip: Secondary | ICD-10-CM

## 2022-04-24 ENCOUNTER — Other Ambulatory Visit (HOSPITAL_COMMUNITY): Payer: Self-pay

## 2022-04-29 ENCOUNTER — Ambulatory Visit
Admission: RE | Admit: 2022-04-29 | Discharge: 2022-04-29 | Disposition: A | Discharge status: Home / Self Care | Payer: Medicare Other | Source: Ambulatory Visit | Attending: Orthopedic Surgery | Admitting: Orthopedic Surgery

## 2022-04-29 DIAGNOSIS — M25552 Pain in left hip: Secondary | ICD-10-CM

## 2022-04-29 MED ORDER — IOPAMIDOL (ISOVUE-300) INJECTION 61%
100.0000 mL | Freq: Once | INTRAVENOUS | Status: AC | PRN
  Administered 2022-04-29: 100 mL via INTRAVENOUS

## 2022-05-01 ENCOUNTER — Other Ambulatory Visit (HOSPITAL_COMMUNITY): Payer: Self-pay

## 2022-05-04 ENCOUNTER — Other Ambulatory Visit (HOSPITAL_COMMUNITY): Payer: Self-pay

## 2022-05-04 MED ORDER — FOLIC ACID 1 MG PO TABS
2.0000 mg | ORAL_TABLET | Freq: Every day | ORAL | 3 refills | Status: DC
  Filled 2022-05-04: qty 180, 90d supply, fill #0
  Filled 2022-08-04 – 2022-08-05 (×2): qty 180, 90d supply, fill #1
  Filled 2022-11-03: qty 180, 90d supply, fill #2
  Filled 2023-02-02: qty 180, 90d supply, fill #3

## 2022-05-07 ENCOUNTER — Other Ambulatory Visit (HOSPITAL_COMMUNITY): Payer: Self-pay

## 2022-05-16 ENCOUNTER — Other Ambulatory Visit (HOSPITAL_COMMUNITY): Payer: Self-pay

## 2022-05-16 MED ORDER — GABAPENTIN 100 MG PO CAPS
100.0000 mg | ORAL_CAPSULE | Freq: Three times a day (TID) | ORAL | 2 refills | Status: DC
  Filled 2022-05-16: qty 30, 10d supply, fill #0
  Filled 2022-06-01: qty 30, 10d supply, fill #1

## 2022-05-18 ENCOUNTER — Other Ambulatory Visit (HOSPITAL_COMMUNITY): Payer: Self-pay

## 2022-05-19 ENCOUNTER — Other Ambulatory Visit (HOSPITAL_COMMUNITY): Payer: Self-pay

## 2022-05-19 MED ORDER — PREDNISONE 10 MG PO TABS
10.0000 mg | ORAL_TABLET | Freq: Every day | ORAL | 0 refills | Status: DC
  Filled 2022-05-19: qty 90, 90d supply, fill #0

## 2022-05-22 ENCOUNTER — Other Ambulatory Visit (HOSPITAL_COMMUNITY): Payer: Self-pay

## 2022-05-26 ENCOUNTER — Other Ambulatory Visit (HOSPITAL_COMMUNITY): Payer: Self-pay

## 2022-06-04 ENCOUNTER — Other Ambulatory Visit (HOSPITAL_COMMUNITY): Payer: Self-pay

## 2022-06-04 MED ORDER — COLESTIPOL HCL 1 G PO TABS
2.0000 g | ORAL_TABLET | Freq: Every evening | ORAL | 1 refills | Status: DC
  Filled 2022-06-04: qty 60, 30d supply, fill #0
  Filled 2022-08-04 – 2022-08-05 (×2): qty 60, 30d supply, fill #1

## 2022-06-05 ENCOUNTER — Other Ambulatory Visit (HOSPITAL_COMMUNITY): Payer: Self-pay

## 2022-06-26 ENCOUNTER — Other Ambulatory Visit (HOSPITAL_COMMUNITY): Payer: Self-pay

## 2022-06-26 MED ORDER — GABAPENTIN 100 MG PO CAPS
100.0000 mg | ORAL_CAPSULE | Freq: Three times a day (TID) | ORAL | 5 refills | Status: DC
  Filled 2022-06-26: qty 90, 30d supply, fill #0
  Filled 2022-08-04 – 2022-08-05 (×2): qty 90, 30d supply, fill #1
  Filled 2022-09-17: qty 90, 30d supply, fill #2
  Filled 2022-10-21: qty 90, 30d supply, fill #3
  Filled 2022-12-01: qty 90, 30d supply, fill #4
  Filled 2023-01-02: qty 90, 30d supply, fill #5

## 2022-06-26 MED ORDER — POTASSIUM CHLORIDE CRYS ER 20 MEQ PO TBCR
EXTENDED_RELEASE_TABLET | ORAL | 5 refills | Status: DC
  Filled 2022-06-26: qty 30, 30d supply, fill #0

## 2022-07-01 ENCOUNTER — Other Ambulatory Visit (HOSPITAL_COMMUNITY): Payer: Self-pay

## 2022-07-02 ENCOUNTER — Other Ambulatory Visit (HOSPITAL_COMMUNITY): Payer: Self-pay

## 2022-07-02 MED ORDER — AZATHIOPRINE 50 MG PO TABS
50.0000 mg | ORAL_TABLET | Freq: Every day | ORAL | 0 refills | Status: DC
  Filled 2022-07-02: qty 166, 90d supply, fill #0

## 2022-07-02 MED ORDER — PREDNISONE 2.5 MG PO TABS
2.5000 mg | ORAL_TABLET | Freq: Every day | ORAL | 3 refills | Status: DC
  Filled 2022-07-02: qty 90, 90d supply, fill #0

## 2022-07-02 MED ORDER — PREDNISONE 5 MG PO TABS
10.0000 mg | ORAL_TABLET | Freq: Every day | ORAL | 3 refills | Status: DC
  Filled 2022-07-02: qty 180, 90d supply, fill #0

## 2022-07-03 ENCOUNTER — Other Ambulatory Visit (HOSPITAL_COMMUNITY): Payer: Self-pay

## 2022-07-07 ENCOUNTER — Other Ambulatory Visit (HOSPITAL_COMMUNITY): Payer: Self-pay

## 2022-07-07 MED ORDER — POTASSIUM CHLORIDE CRYS ER 20 MEQ PO TBCR
20.0000 meq | EXTENDED_RELEASE_TABLET | Freq: Three times a day (TID) | ORAL | 1 refills | Status: DC
  Filled 2022-07-07: qty 270, 90d supply, fill #0
  Filled 2022-10-07: qty 270, 90d supply, fill #1

## 2022-07-08 ENCOUNTER — Other Ambulatory Visit (HOSPITAL_COMMUNITY): Payer: Self-pay

## 2022-07-16 ENCOUNTER — Other Ambulatory Visit (HOSPITAL_COMMUNITY): Payer: Self-pay

## 2022-07-16 MED ORDER — CIPROFLOXACIN HCL 500 MG PO TABS
500.0000 mg | ORAL_TABLET | Freq: Two times a day (BID) | ORAL | 0 refills | Status: AC
  Filled 2022-07-16: qty 14, 7d supply, fill #0

## 2022-07-16 MED ORDER — FLUCONAZOLE 200 MG PO TABS
200.0000 mg | ORAL_TABLET | Freq: Every day | ORAL | 0 refills | Status: AC
  Filled 2022-07-16: qty 5, 5d supply, fill #0

## 2022-07-17 ENCOUNTER — Other Ambulatory Visit (HOSPITAL_COMMUNITY): Payer: Self-pay

## 2022-07-22 ENCOUNTER — Other Ambulatory Visit (HOSPITAL_COMMUNITY): Payer: Self-pay

## 2022-07-30 ENCOUNTER — Other Ambulatory Visit (HOSPITAL_COMMUNITY): Payer: Self-pay

## 2022-08-05 ENCOUNTER — Other Ambulatory Visit: Payer: Self-pay

## 2022-08-05 ENCOUNTER — Other Ambulatory Visit (HOSPITAL_COMMUNITY): Payer: Self-pay

## 2022-08-06 ENCOUNTER — Other Ambulatory Visit (HOSPITAL_COMMUNITY): Payer: Self-pay

## 2022-08-10 ENCOUNTER — Other Ambulatory Visit (HOSPITAL_COMMUNITY): Payer: Self-pay

## 2022-09-01 ENCOUNTER — Other Ambulatory Visit (HOSPITAL_COMMUNITY): Payer: Self-pay

## 2022-09-02 ENCOUNTER — Other Ambulatory Visit (HOSPITAL_BASED_OUTPATIENT_CLINIC_OR_DEPARTMENT_OTHER): Payer: Self-pay

## 2022-09-02 ENCOUNTER — Other Ambulatory Visit (HOSPITAL_COMMUNITY): Payer: Self-pay

## 2022-09-02 MED ORDER — ATORVASTATIN CALCIUM 20 MG PO TABS
20.0000 mg | ORAL_TABLET | Freq: Every day | ORAL | 3 refills | Status: DC
  Filled 2022-09-02: qty 90, 90d supply, fill #0

## 2022-09-02 MED ORDER — ATORVASTATIN CALCIUM 20 MG PO TABS
ORAL_TABLET | ORAL | 3 refills | Status: AC
  Filled 2022-09-02: qty 90, 90d supply, fill #0
  Filled 2022-11-25: qty 90, 90d supply, fill #1
  Filled 2023-02-26: qty 90, 90d supply, fill #2
  Filled 2023-05-31: qty 90, 90d supply, fill #3

## 2022-09-05 ENCOUNTER — Other Ambulatory Visit (HOSPITAL_COMMUNITY): Payer: Self-pay

## 2022-09-08 ENCOUNTER — Other Ambulatory Visit (HOSPITAL_COMMUNITY): Payer: Self-pay

## 2022-09-08 MED ORDER — HYDROCORTISONE 10 MG PO TABS
ORAL_TABLET | ORAL | 5 refills | Status: DC
  Filled 2022-09-08: qty 120, 30d supply, fill #0
  Filled 2022-10-07: qty 120, 30d supply, fill #1

## 2022-09-17 ENCOUNTER — Other Ambulatory Visit (HOSPITAL_COMMUNITY): Payer: Self-pay

## 2022-10-08 ENCOUNTER — Other Ambulatory Visit (HOSPITAL_COMMUNITY): Payer: Self-pay

## 2022-10-21 ENCOUNTER — Other Ambulatory Visit (HOSPITAL_COMMUNITY): Payer: Self-pay

## 2022-10-23 ENCOUNTER — Encounter (HOSPITAL_COMMUNITY): Payer: Self-pay

## 2022-10-23 ENCOUNTER — Other Ambulatory Visit (HOSPITAL_COMMUNITY): Payer: Self-pay

## 2022-10-23 MED ORDER — ZENPEP 25000-79000 UNITS PO CPEP
21.0000 | ORAL_CAPSULE | Freq: Every day | ORAL | 1 refills | Status: DC
  Filled 2022-10-23 – 2023-01-22 (×2): qty 650, 30d supply, fill #0
  Filled 2023-02-16: qty 650, 30d supply, fill #1

## 2022-10-27 ENCOUNTER — Other Ambulatory Visit (HOSPITAL_COMMUNITY): Payer: Self-pay

## 2022-10-29 ENCOUNTER — Other Ambulatory Visit (HOSPITAL_COMMUNITY): Payer: Self-pay

## 2022-10-29 MED ORDER — COLESTIPOL HCL 1 G PO TABS
ORAL_TABLET | ORAL | 1 refills | Status: DC
  Filled 2022-10-29: qty 60, 30d supply, fill #0

## 2022-11-04 ENCOUNTER — Other Ambulatory Visit (HOSPITAL_COMMUNITY): Payer: Self-pay

## 2022-11-06 ENCOUNTER — Other Ambulatory Visit (HOSPITAL_COMMUNITY): Payer: Self-pay

## 2022-11-10 ENCOUNTER — Other Ambulatory Visit (HOSPITAL_COMMUNITY): Payer: Self-pay

## 2022-11-11 ENCOUNTER — Other Ambulatory Visit (HOSPITAL_COMMUNITY): Payer: Self-pay

## 2022-11-11 MED ORDER — DEXAMETHASONE SODIUM PHOSPHATE 4 MG/ML IJ SOLN
INTRAMUSCULAR | 1 refills | Status: DC
Start: 2022-11-10 — End: 2023-01-20
  Filled 2022-11-11: qty 1, 1d supply, fill #0

## 2022-11-11 MED ORDER — HYDROCORTISONE 10 MG PO TABS
ORAL_TABLET | ORAL | 5 refills | Status: DC
  Filled 2022-11-11: qty 120, 30d supply, fill #0
  Filled 2022-12-09: qty 120, 30d supply, fill #1
  Filled 2023-01-02: qty 120, 30d supply, fill #2
  Filled 2023-02-11: qty 120, 30d supply, fill #3
  Filled 2023-03-10: qty 120, 30d supply, fill #4
  Filled 2023-04-13: qty 120, 30d supply, fill #5

## 2022-11-12 ENCOUNTER — Other Ambulatory Visit (HOSPITAL_COMMUNITY): Payer: Self-pay

## 2022-11-18 ENCOUNTER — Other Ambulatory Visit (HOSPITAL_COMMUNITY): Payer: Self-pay

## 2022-11-25 ENCOUNTER — Other Ambulatory Visit (HOSPITAL_COMMUNITY): Payer: Self-pay

## 2022-11-26 ENCOUNTER — Other Ambulatory Visit (HOSPITAL_COMMUNITY): Payer: Self-pay

## 2022-11-26 MED ORDER — POTASSIUM CHLORIDE CRYS ER 20 MEQ PO TBCR
20.0000 meq | EXTENDED_RELEASE_TABLET | Freq: Three times a day (TID) | ORAL | 1 refills | Status: DC
  Filled 2022-11-27 – 2022-12-18 (×2): qty 270, 90d supply, fill #0
  Filled 2023-03-12 – 2023-03-15 (×2): qty 270, 90d supply, fill #1

## 2022-11-27 ENCOUNTER — Other Ambulatory Visit (HOSPITAL_COMMUNITY): Payer: Self-pay

## 2022-12-17 ENCOUNTER — Other Ambulatory Visit (HOSPITAL_COMMUNITY): Payer: Self-pay

## 2022-12-17 MED ORDER — FLUCONAZOLE 150 MG PO TABS
150.0000 mg | ORAL_TABLET | Freq: Once | ORAL | 0 refills | Status: AC
  Filled 2022-12-17: qty 10, 10d supply, fill #0

## 2022-12-17 MED ORDER — AMOXICILLIN 500 MG PO CAPS
2000.0000 mg | ORAL_CAPSULE | Freq: Once | ORAL | 0 refills | Status: AC
  Filled 2022-12-17 (×2): qty 10, 2d supply, fill #0

## 2022-12-17 MED ORDER — GABAPENTIN 100 MG PO CAPS
100.0000 mg | ORAL_CAPSULE | Freq: Three times a day (TID) | ORAL | 1 refills | Status: DC
  Filled 2023-02-02: qty 90, 30d supply, fill #0
  Filled 2023-03-10: qty 90, 30d supply, fill #1

## 2022-12-17 MED ORDER — POTASSIUM CHLORIDE CRYS ER 20 MEQ PO TBCR: 20.0000 meq | EXTENDED_RELEASE_TABLET | Freq: Three times a day (TID) | ORAL | 1 refills | Status: DC

## 2022-12-18 ENCOUNTER — Other Ambulatory Visit (HOSPITAL_COMMUNITY): Payer: Self-pay

## 2023-01-04 ENCOUNTER — Other Ambulatory Visit (HOSPITAL_COMMUNITY): Payer: Self-pay

## 2023-01-19 ENCOUNTER — Encounter: Payer: Self-pay | Admitting: Cardiovascular Disease

## 2023-01-19 ENCOUNTER — Other Ambulatory Visit (HOSPITAL_BASED_OUTPATIENT_CLINIC_OR_DEPARTMENT_OTHER): Payer: Self-pay

## 2023-01-19 ENCOUNTER — Other Ambulatory Visit (HOSPITAL_COMMUNITY): Payer: Self-pay

## 2023-01-19 ENCOUNTER — Ambulatory Visit: Payer: Medicare Other | Attending: Cardiovascular Disease | Admitting: Cardiovascular Disease

## 2023-01-19 VITALS — BP 133/77 | HR 67 | Ht 64.0 in | Wt 185.2 lb

## 2023-01-19 DIAGNOSIS — E7800 Pure hypercholesterolemia, unspecified: Secondary | ICD-10-CM

## 2023-01-19 DIAGNOSIS — R002 Palpitations: Secondary | ICD-10-CM | POA: Diagnosis not present

## 2023-01-19 DIAGNOSIS — E2740 Unspecified adrenocortical insufficiency: Secondary | ICD-10-CM | POA: Diagnosis not present

## 2023-01-19 DIAGNOSIS — I951 Orthostatic hypotension: Secondary | ICD-10-CM | POA: Diagnosis not present

## 2023-01-19 DIAGNOSIS — M352 Behcet's disease: Secondary | ICD-10-CM

## 2023-01-19 MED ORDER — PROPRANOLOL HCL 80 MG PO TABS
80.0000 mg | ORAL_TABLET | Freq: Two times a day (BID) | ORAL | 3 refills | Status: DC
  Filled 2023-01-19: qty 180, 90d supply, fill #0
  Filled 2023-05-01: qty 180, 90d supply, fill #1
  Filled 2023-07-26: qty 180, 90d supply, fill #2
  Filled 2023-10-27: qty 180, 90d supply, fill #3

## 2023-01-19 NOTE — Patient Instructions (Signed)

## 2023-01-19 NOTE — Progress Notes (Signed)
 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 She has numerous other medical noncardiac problems including Behcet's syndrome severe sacroiliac joint disease, gastroesophageal reflux disease, colitis with recurrent diarrhea, nonsecreting pituitary adenoma, tremor sickle cell trait.  She is doing quite well from a cardiovascular point of view.  In the summer in the fall she had a lot of problems with nausea, vomiting, a migrating rash on her limbs and trunk and swelling of her face and throat.  She gradually discontinued her medications one by one to see if there was a culprit and she found that the Creon  was actually causing her problems.  She has not switched to a different pancreatic enzyme compounding called Zenpep  and is doing much better.  When she temporarily interrupted her propranolol  for just a day her tremor became much worse so she went right back on it.  She continues have dramatically improved mobility and stability after she had her lumbar spine surgery in 2023.  She is no longer using crutches and has not had any instability or falls.  She does have some right shoulder problems from using the crutch for a long time.  She has not had any episodes of palpitations, dizziness or syncope.  She continues to take hydrocortisone  supplement for adrenal insufficiency and is still on a very low-dose of prednisone  as needed as well as Humira for Behcet's syndrome.  Her rheumatologist is Dr. Rox.  She has a very long list of drug intolerances and allergies.  She takes atorvastatin  for hypercholesterolemia and  last September LDL cholesterol was 59.   Past Medical History:  Diagnosis Date   Acid reflux    Adrenal insufficiency (HCC)    Anxiety    when she gets sick she gets anxious   Arrhythmia    Arthritis    inflammatory arthritis   Asthma    Behcet's syndrome (HCC)    Chronic back pain    Chronic neck pain    Colitis    Connective tissue disorder (HCC)    Depression    Dysrhythmia    POTS syndrome   Endometritis    Foot drop    due to muscle weakness from immunosuppressants   GERD (gastroesophageal reflux disease)    Gluten intolerance    Heart murmur    IBS (irritable bowel syndrome)    Myocardial infarction (HCC)    per pt, mild mi due to immunosuppressant therapy   Nasal ulcer    gets occasionally from medications   Osteoarthritis    Osteoporosis    T score reported -2.7   Pneumonia    20 years ago   PONV (postoperative nausea and vomiting)    also low blood pressure at times. do not use lactated ringers . must have sugar with saline d/t adrenal insufficiency   Refusal of blood transfusions as patient is Jehovah's Witness    Renal cyst 2019   Sickle cell trait (HCC)    Stroke (HCC) 2014   small peripheral stroke. Mononeuritis multiplex   Subjective visual disturbance 07/14/2012   Superficial phlebitis and thrombophlebitis of both lower extremities 2019  Tear of lateral meniscus of right knee, current    Tear of lateral & medial meniscus of right knee   Tinnitus    left ear sees Dr Ethyl (ENT)   Uveitis    Visual changes 2019   left eye vision only with distance, right eye has floater. vascular changes    Past Surgical History:  Procedure Laterality Date   ANTERIOR CERVICAL DECOMP/DISCECTOMY FUSION N/A 08/22/2013   Procedure: ANTERIOR CERVICAL DECOMPRESSION/DISCECTOMY FUSION 2 LEVELS;  Surgeon: Catalina CHRISTELLA Stains, MD;  Location: [facility];  Service: Neurosurgery;  Laterality: N/A;  C4-5 C5-6 Anterior cervical decompression/diskectomy/fusion   ANTERIOR FUSION  CERVICAL SPINE  08/22/2013   c4 5  6          AXILLARY SURGERY     BACK SURGERY     BREAST EXCISIONAL BIOPSY Right 1986   BREAST SURGERY     Cysts excised   CARDIAC CATHETERIZATION  09/18/2013   CESAREAN SECTION  03/2001   x 1   CHOLECYSTECTOMY  12/2004   COLONOSCOPY  03/2013   DILATION AND CURETTAGE OF UTERUS     EXCISION OF BREAST LESION Right 08/18/2017   Procedure: EXCISION OF RIGHT AXILLARY SOFT TISSUE MASS;  Surgeon: Rodolph Romano, MD;  Location: [facility];  Service: General;  Laterality: Right;   FEMUR IM NAIL Left 04/02/2020   Procedure: INTRAMEDULLARY (IM) NAIL FEMORAL;  Surgeon: Beverley Evalene BIRCH, MD;  Location: [facility];  Service: Orthopedics;  Laterality: Left;   HAND SURGERY Right 07/2001   dupytren   HYSTEROSCOPY     IR SI JOINT INJ / ARTH LEFT W/IMAG GUIDE  2018   si fusion , left    IR SI JOINT INJ / ARTH RIGHT W/IMAG GUIDE Right 2018   JOINT REPLACEMENT Right 11/2016   partial knee replacement   KNEE ARTHROSCOPY WITH LATERAL MENISECTOMY Right 12/12/2012   Procedure: KNEE ARTHROSCOPY WITH LATERAL MENISECTOMY;  Surgeon: Lamar DELENA Millman, MD;  Location: [facility];  Service: Orthopedics;  Laterality: Right;   KNEE SURGERY Left 05/1998   LEFT HEART CATHETERIZATION WITH CORONARY ANGIOGRAM N/A 09/18/2013   Procedure: LEFT HEART CATHETERIZATION WITH CORONARY ANGIOGRAM;  Surgeon: Ozell BIRCH Fell, MD;  Location: [facility];  Service: Cardiovascular;  Laterality: N/A;   LUMBAR DISC SURGERY     OOPHORECTOMY     BSO   PELVIC LAPAROSCOPY     DL   SPINE SURGERY     SUBMANDIBULAR MASS EXCISION     THYROID  CYST EXCISION  1983   thyroglossal duct cyst   VAGINAL HYSTERECTOMY  07/2007   LAVH BSO    Current Medications: Outpatient Medications Prior to Visit  Medication Sig Dispense Refill   acetaminophen  (TYLENOL ) 500 MG tablet Take 500 mg by mouth 2 (two) times daily.     Adalimumab 40 MG/0.8ML PNKT Inject 40 mg into the skin every Saturday.     Aloe-Sodium  Chloride (AYR SALINE NASAL GEL NA) Place 1 spray into the nose daily as needed (congestion).     aspirin  EC 325 MG tablet Take 325 mg by mouth 2 (two) times daily.      atorvastatin  (LIPITOR) 20 MG tablet Take 1 tablet (20 mg total) by mouth once daily 90 tablet 3   azelastine  (ASTELIN ) 0.1 % nasal spray Place 2 sprays into both nostrils once daily 30 mL 3   Calcium  Carb-Cholecalciferol  (CALCIUM /VITAMIN D ) 600-400 MG-UNIT TABS Take 1 tablet by mouth 3 (three) times daily.     Calcium   Carb-Cholecalciferol  500-2.5 MG-MCG CHEW Chew 500 mg by mouth 3 (three) times daily.     diphenhydrAMINE  (BENADRYL ) 25 mg capsule Take 25 mg by mouth at bedtime.     DULoxetine  (CYMBALTA ) 60 MG capsule Take 1 capsule (60 mg total) by mouth once daily 90 capsule 3   fluconazole  (DIFLUCAN ) 200 MG tablet Take 1 tablet (200 mg) by mouth daily begin on day 3 of antibiotic 5 tablet 0   Fluticasone -Umeclidin-Vilant (TRELEGY ELLIPTA ) 100-62.5-25 MCG/ACT AEPB Inhale into the lungs daily.     folic acid  (FOLVITE ) 1 MG tablet Take 2 tablets (2 mg total) by mouth daily in the afternoon. 180 tablet 3   gabapentin  (NEURONTIN ) 100 MG capsule Take 1 capsule (100 mg total) by mouth 3 (three) times daily. 90 capsule 1   hydrocortisone  (CORTEF ) 10 MG tablet Take 2 tablets (20 mg total) by mouth daily before breakfast AND 1 tablet (10 mg total) daily with lunch. Extra tablets as needed for stress dosing. 120 tablet 5   loperamide  (IMODIUM ) 2 MG capsule Take 1 capsule (2 mg total) by mouth as needed for diarrhea or loose stools. (Patient taking differently: Take 2-4 mg by mouth See admin instructions. Take 2 capsules (4mg ) at onset of diarrhea, and 1 capsule (2mg ) every 4 hours after until symptoms subside) 30 capsule 0   Magnesium  500 MG CAPS Take 500 mg by mouth 3 (three) times daily.     Multiple Vitamins-Minerals (BARIATRIC MULTIVITAMINS/IRON PO) Take 1 tablet by mouth daily.     Oral Electrolytes (THERMOTABS PO) Take 2 tablets by mouth  daily as needed (hydration). May take an additional 2 more 2 tablet doses as needed for physical activity or heat     OVER THE COUNTER MEDICATION Place 1 drop into both eyes as needed (dry eyes). Crocodile tears otc eye drops     Pancrelipase , Lip-Prot-Amyl, (ZENPEP ) 25000-79000 units CPEP Take 5 capsules by mouth with meals and 3 capsules with snacks 650 capsule 1   potassium chloride  SA (KLOR-CON  M) 20 MEQ tablet Take 1 tablet (20 mEq total) by mouth 3 (three) times daily. 270 tablet 1   Simethicone 250 MG CAPS Take 1 tablet by mouth 3 (three) times daily.     SYRINGE-NEEDLE, DISP, 3 ML 25G X 1 3 ML MISC Use to inject dexamethasone  into the muscle. 30 each 0   TUBERCULIN SYR 1CC/27GX1/2 (B-D TB SYRINGE 1CC/27GX1/2) 27G X 1/2 1 ML MISC Use to inject dexamethasone  as needed 30 each 0   propranolol  (INDERAL ) 80 MG tablet Take 1 tablet (80 mg total) by mouth 2 (two) times daily. PATIENT MUST SCHEDULE APPOINTMENT FOR FUTURE REFILLS FIRST ATTEMPT 180 tablet 3   albuterol  (VENTOLIN  HFA) 108 (90 Base) MCG/ACT inhaler Inhale 2 inhalations into the lungs every 6 (six) hours as needed for Shortness of Breath (Patient not taking: Reported on 01/19/2023) 6.7 g 2   atorvastatin  (LIPITOR) 20 MG tablet Take 1 tablet (20 mg total) by mouth daily. 90 tablet 3   colchicine  0.6 MG tablet Take 1 tablet by mouth 2 times daily 180 tablet 1   colestipol  (COLESTID ) 1 g tablet Take 2 tablets (2 g total) by mouth at bedtime before a meal 60 tablet 1   dexamethasone  (DECADRON ) 4 MG/ML injection Inject 1 mL (4 mg total) into the muscle once as needed (for adrenal crisis) for up to 1 dose. (Patient not taking: Reported on 01/19/2023) 1 mL 1   dexamethasone  (DECADRON ) 4 MG/ML injection Use in case of emergency  1 mL 1   ferrous sulfate 325 (65 FE) MG EC tablet Take 325 mg by mouth daily in the afternoon.     fluconazole  (DIFLUCAN ) 200 MG tablet Take 4 tablets (800mg ) by mouth once daily for 14 days 56 tablet 0   fluocinonide  cream (LIDEX) 0.05 % Apply 1 application  topically daily as needed (rash). (Patient not taking: Reported on 01/19/2023)     fluticasone  (FLONASE ) 50 MCG/ACT nasal spray Place 1 spray into the nose daily as needed for allergies. (Patient not taking: Reported on 01/19/2023)     folic acid  (FOLVITE ) 1 MG tablet Take 2 tablets by mouth once daily in the afternoon 180 tablet 3   gabapentin  (NEURONTIN ) 100 MG capsule TAKE 1 CAPSULE BY MOUTH 3 TIMES DAILY 270 capsule 3   gabapentin  (NEURONTIN ) 100 MG capsule Take 1 capsule (100 mg total) by mouth 3 (three) times daily. 30 capsule 2   gabapentin  (NEURONTIN ) 100 MG capsule Take 1 capsule (100 mg) by mouth 3 times daily. 90 capsule 5   hydrocortisone  (CORTEF ) 10 MG tablet Take 2 tablets (20 mg total) by mouth every morning before breakfast AND 1 tablet (10 mg total) daily with lunch. 120 tablet 5   Pancrelipase , Lip-Prot-Amyl, 24000-76000 units CPEP Take 3-5 capsules by mouth See admin instructions. Take 5 capsules by mouth with meals and 3 capsules with snacks     potassium chloride  SA (KLOR-CON  M) 20 MEQ tablet Take 1 tablet (20 mEq) by mouth once daily 30 tablet 5   potassium chloride  SA (KLOR-CON  M) 20 MEQ tablet Take 1 tablet (20 mEq total) by mouth 3 (three) times daily. 270 tablet 1   predniSONE  (DELTASONE ) 2.5 MG tablet Take 1 tablet by mouth once daily as needed (adrenal insufficiency) EMERGENCY DOSE -due to renal insufficiency (Patient not taking: Reported on 01/12/2022) 60 tablet 1   ranitidine (ZANTAC) 75 MG tablet Take 75 mg by mouth in the morning.     propranolol  (INDERAL ) 80 MG tablet Take 1 tablet (80 mg total) by mouth 2 (two) times daily. 180 tablet 3   No facility-administered medications prior to visit.     Allergies:   Celebrex [celecoxib], Fentanyl , Golimumab, Latex, Sulfa drugs cross reactors, Sulfites, Spiractazide [hydrochlorothiazide w-spironolactone], Adhesive [tape], Codeine, Epinephrine , Erythromycin, Loratadine , and Morphine and  codeine   Social History   Socioeconomic History   Marital status: Divorced    Spouse name: Not on file   Number of children: 1   Years of education: Not on file   Highest education level: Master's degree (e.g., MA, MS, MEng, MEd, MSW, MBA)  Occupational History   Occupation: disabled  Tobacco Use   Smoking status: Never   Smokeless tobacco: Never  Vaping Use   Vaping status: Never Used  Substance and Sexual Activity   Alcohol use: No    Alcohol/week: 0.0 standard drinks of alcohol   Drug use: No   Sexual activity: Not Currently    Birth control/protection: Surgical    Comment: 1st intercourse 22 yo-5 partners  Other Topics Concern   Not on file  Social History Narrative   Not on file   Social Drivers of Health   Financial Resource Strain: Low Risk  (09/07/2022)   Received [facility]   Overall Financial Resource Strain (CARDIA)    Difficulty of Paying Living Expenses: Not very hard  Food Insecurity: No Food Insecurity (01/14/2023)   Received from [facility]   Hunger Vital Sign  Worried About Programme Researcher, Broadcasting/film/video in the Last Year: Never true    [employer] of Food in the Last Year: Never true  Transportation Needs: Unmet Transportation Needs (01/14/2023)   Received from [facility] - Transportation    In the past 12 months, has lack of transportation kept you from medical appointments or from getting medications?: Yes    Lack of Transportation (Non-Medical): No  Physical Activity: Inactive (06/17/2020)   [facility]   Exercise Vital Sign    Days of Exercise per Week: 0 days    Minutes of Exercise per Session: 0 min  Stress: No Stress Concern Present (06/17/2020)   Received from [facility], [facility]   [employer] of Occupational Health - Occupational Stress Questionnaire    Feeling of Stress : Not at  all  Social Connections: Somewhat Isolated (08/12/2017)   Social Connection and Isolation Panel [NHANES]    Frequency of Communication with Friends and Family: More than three times a week    Frequency of Social Gatherings with Friends and Family: Twice a week    Attends Religious Services: More than 4 times per year    Active Member of [employer] or Organizations: No    Attends Engineer, Structural: Never    Marital Status: Divorced     Family History:  The patient's family history includes Breast cancer in her mother and sister; CAD in her maternal grandfather; Depression in her mother; Diabetes in her mother and sister; Gastric cancer in her father; Hyperlipidemia in her mother and sister; Hypertension in her sister; Lupus in her cousin; Myasthenia gravis in her maternal aunt; Polymyositis in her maternal aunt; Prostate cancer in her paternal uncle; Rheum arthritis in her cousin; Skin cancer in her maternal aunt; Stroke in her mother; Thyroid  disease in her sister.   ROS:   Please see the history of present illness.    ROS The patient specifically denies any chest pain at rest or with exertion, dyspnea at rest or with exertion, orthopnea, paroxysmal nocturnal dyspnea, syncope, palpitations, focal neurological deficits, intermittent claudication, lower extremity edema, unexplained weight gain, cough, hemoptysis or wheezing.  PHYSICAL EXAM:   VS:  BP 133/77 (BP Location: Left Arm, Patient Position: Sitting, Cuff Size: Large)   Pulse 67   Ht 5' 4 (1.626 m)   Wt 185 lb 3.2 oz (84 kg)   SpO2 93%   BMI 31.79 kg/m      General: Alert, oriented x3, no distress, mildly obese.  She has a small cushingoid hump. Head: no evidence of trauma, PERRL, EOMI, no exophtalmos or lid lag, no myxedema, no xanthelasma; normal ears, nose and oropharynx Neck: normal jugular venous pulsations and no hepatojugular reflux; brisk carotid pulses without delay and no carotid bruits Chest: clear to  auscultation, no signs of consolidation by percussion or palpation, normal fremitus, symmetrical and full respiratory excursions Cardiovascular: normal position and quality of the apical impulse, regular rhythm, normal first and second heart sounds, no murmurs, rubs or gallops Abdomen: no tenderness or distention, no masses by palpation, no abnormal pulsatility or arterial bruits, normal bowel sounds, no hepatosplenomegaly Extremities: no clubbing, cyanosis or edema; 2+ radial, ulnar and brachial pulses bilaterally; 2+ right femoral, posterior tibial and dorsalis pedis pulses; 2+ left femoral, posterior tibial and dorsalis pedis pulses; no subclavian or femoral bruits Neurological: grossly nonfocal Psych: Normal mood and affect  Wt Readings from Last 3 Encounters:  01/19/23 185 lb 3.2 oz (84 kg)  01/12/22 184 lb 3.2 oz (83.6 kg)  12/03/20 172 lb 9.6 oz (78.3 kg)      Studies/Labs Reviewed:   EKG:    EKG Interpretation Date/Time:  Tuesday January 19 2023 16:02:15 EST Ventricular Rate:  67 PR Interval:  152 QRS Duration:  74 QT Interval:  400 QTC Calculation: 422 R Axis:   4  Text Interpretation: Normal sinus rhythm Low voltage QRS When compared with ECG of 01-Apr-2020 12:45, No significant change was found Confirmed by Joedy Eickhoff ([id number]) on 01/19/2023 4:27:39 PM          LABS BMET    Component Value Date/Time   NA 140 04/03/2020 0643   K 3.6 04/03/2020 0643   CL 104 04/03/2020 0643   CO2 28 04/03/2020 0643   GLUCOSE 123 (H) 04/03/2020 0643   BUN 16 04/03/2020 0643   CREATININE 0.67 04/03/2020 0643   CALCIUM  8.6 (L) 04/03/2020 0643   GFRNONAA >60 04/03/2020 0643   GFRAA >60 02/17/2019 0818    Lipid Panel    Component Value Date/Time   CHOL 175 09/18/2013 1030   TRIG 77 09/18/2013 1030   HDL 52 09/18/2013 1030   CHOLHDL 3.4 09/18/2013 1030   VLDL 15 09/18/2013 1030   LDLCALC 108 (H) 09/18/2013 1030   09/08/2022 cholesterol 144, triglycerides 179, LDL  59, HDL 49  11/30/2022 sodium 857, potassium 4.6, creatinine 0.6, normal liver function tests, glucose 96, hemoglobin 13.6.  ASSESSMENT:    1. Adrenal insufficiency (HCC)   2. Orthostatic hypotension   3. Palpitations   4. Hypercholesterolemia   5. Behcet's disease (HCC)      PLAN:  In order of problems listed above:  Orthostatic hypotension: Asymptomatic since starting hydrocortisone  supplementation for adrenal insufficiency. HLP: Excellent lipid profile on atorvastatin . Palpitations: On propranolol  which helps with both the palpitations and her tremor.  Reviewed the risk of beta-blocker rebound if this is interrupted abruptly. Behcet's: Symptoms are well-controlled on Humira and prednisone ; Dr. Rox is her rheumatologist at [facility]. Sickle cell trait    Medication Adjustments/Labs and Tests Ordered: Current medicines are reviewed at length with the patient today.  Concerns regarding medicines are outlined above.  Medication changes, Labs and Tests ordered today are listed in the Patient Instructions below. Patient Instructions  Medication Instructions:  No changes *If you need a refill on your cardiac medications before your next appointment, please call your pharmacy*  Follow-Up: At [facility], you and your health needs are our priority.  As part of our continuing mission to provide you with exceptional heart care, we have created designated Provider Care Teams.  These Care Teams include your primary Cardiologist (physician) and Advanced Practice Providers (APPs -  Physician Assistants and Nurse Practitioners) who all work together to provide you with the care you need, when you need it.  We recommend signing up for the patient portal called MyChart.  Sign up information is provided on this After Visit Summary.  MyChart is used to connect with patients for Virtual Visits (Telemedicine).  Patients are able to view lab/test results, encounter notes, upcoming  appointments, etc.  Non-urgent messages can be sent to your provider as well.   To learn more about what you can do with MyChart, go to [email or web address].    Your next appointment:   1 year(s)  Provider:   Jerel Balding, MD  Signed, Jerel Balding, MD  01/20/2023 3:57 PM    [facility] [address] [place], [place], [place]  [address] Phone: [phone]; Fax: [phone]

## 2023-01-20 ENCOUNTER — Other Ambulatory Visit (HOSPITAL_COMMUNITY): Payer: Self-pay

## 2023-01-21 ENCOUNTER — Other Ambulatory Visit (HOSPITAL_COMMUNITY): Payer: Self-pay

## 2023-01-22 ENCOUNTER — Other Ambulatory Visit (HOSPITAL_COMMUNITY): Payer: Self-pay

## 2023-01-26 ENCOUNTER — Other Ambulatory Visit (HOSPITAL_COMMUNITY): Payer: Self-pay

## 2023-01-26 MED ORDER — DULOXETINE HCL 60 MG PO CPEP
60.0000 mg | ORAL_CAPSULE | Freq: Every day | ORAL | 3 refills | Status: DC
  Filled 2023-01-26: qty 90, 90d supply, fill #0
  Filled 2023-04-29: qty 90, 90d supply, fill #1
  Filled 2023-07-26: qty 90, 90d supply, fill #2
  Filled 2023-10-27: qty 90, 90d supply, fill #3

## 2023-02-01 ENCOUNTER — Other Ambulatory Visit (HOSPITAL_COMMUNITY): Payer: Self-pay

## 2023-02-02 ENCOUNTER — Other Ambulatory Visit (HOSPITAL_COMMUNITY): Payer: Self-pay

## 2023-02-02 ENCOUNTER — Other Ambulatory Visit: Payer: Self-pay

## 2023-02-17 ENCOUNTER — Other Ambulatory Visit (HOSPITAL_COMMUNITY): Payer: Self-pay

## 2023-02-18 ENCOUNTER — Other Ambulatory Visit (HOSPITAL_COMMUNITY): Payer: Self-pay

## 2023-02-23 ENCOUNTER — Other Ambulatory Visit (HOSPITAL_COMMUNITY): Payer: Self-pay

## 2023-03-02 ENCOUNTER — Other Ambulatory Visit (HOSPITAL_COMMUNITY): Payer: Self-pay

## 2023-03-10 ENCOUNTER — Other Ambulatory Visit (HOSPITAL_COMMUNITY): Payer: Self-pay

## 2023-03-10 MED ORDER — FAMOTIDINE 40 MG PO TABS
40.0000 mg | ORAL_TABLET | Freq: Every day | ORAL | 11 refills | Status: AC
  Filled 2023-03-10: qty 30, 30d supply, fill #0
  Filled 2023-04-05: qty 30, 30d supply, fill #1
  Filled 2023-05-01: qty 30, 30d supply, fill #2
  Filled 2023-05-31: qty 30, 30d supply, fill #3
  Filled 2023-07-13: qty 30, 30d supply, fill #4
  Filled 2023-08-21: qty 30, 30d supply, fill #5
  Filled 2023-09-24: qty 30, 30d supply, fill #6
  Filled 2023-10-20: qty 30, 30d supply, fill #7
  Filled 2023-11-26: qty 30, 30d supply, fill #8
  Filled 2024-01-10: qty 30, 30d supply, fill #9

## 2023-03-10 MED ORDER — COLESTIPOL HCL 1 G PO TABS
2.0000 g | ORAL_TABLET | Freq: Two times a day (BID) | ORAL | 3 refills | Status: DC
  Filled 2023-03-10: qty 360, 90d supply, fill #0
  Filled 2023-06-08: qty 360, 90d supply, fill #1

## 2023-03-12 ENCOUNTER — Other Ambulatory Visit (HOSPITAL_COMMUNITY): Payer: Self-pay

## 2023-03-15 ENCOUNTER — Other Ambulatory Visit (HOSPITAL_COMMUNITY): Payer: Self-pay

## 2023-03-17 ENCOUNTER — Other Ambulatory Visit (HOSPITAL_COMMUNITY): Payer: Self-pay

## 2023-04-05 ENCOUNTER — Other Ambulatory Visit: Payer: Self-pay

## 2023-04-05 ENCOUNTER — Other Ambulatory Visit (HOSPITAL_COMMUNITY): Payer: Self-pay

## 2023-04-05 MED ORDER — ZENPEP 25000-79000 UNITS PO CPEP
ORAL_CAPSULE | ORAL | 1 refills | Status: DC
  Filled 2023-04-05: qty 600, 29d supply, fill #0
  Filled 2023-05-01: qty 600, 29d supply, fill #1
  Filled 2023-10-20: qty 600, 29d supply, fill #2

## 2023-04-06 ENCOUNTER — Other Ambulatory Visit: Payer: Self-pay

## 2023-04-08 ENCOUNTER — Other Ambulatory Visit (HOSPITAL_COMMUNITY): Payer: Self-pay

## 2023-04-08 MED ORDER — ALBUTEROL SULFATE HFA 108 (90 BASE) MCG/ACT IN AERS
INHALATION_SPRAY | RESPIRATORY_TRACT | 1 refills | Status: DC
Start: 2023-04-08 — End: 2023-06-11
  Filled 2023-04-08: qty 6.7, 25d supply, fill #0
  Filled 2023-04-29: qty 6.7, 25d supply, fill #1

## 2023-04-12 ENCOUNTER — Other Ambulatory Visit (HOSPITAL_COMMUNITY): Payer: Self-pay

## 2023-04-13 ENCOUNTER — Other Ambulatory Visit (HOSPITAL_COMMUNITY): Payer: Self-pay

## 2023-04-13 MED ORDER — GABAPENTIN 100 MG PO CAPS
100.0000 mg | ORAL_CAPSULE | Freq: Three times a day (TID) | ORAL | 2 refills | Status: AC
  Filled 2023-04-13: qty 270, 90d supply, fill #0
  Filled 2023-07-13: qty 270, 90d supply, fill #1
  Filled 2023-10-07: qty 270, 90d supply, fill #2

## 2023-04-15 ENCOUNTER — Other Ambulatory Visit (HOSPITAL_COMMUNITY): Payer: Self-pay

## 2023-04-15 MED ORDER — CYCLOSPORINE 0.05 % OP EMUL
1.0000 [drp] | Freq: Two times a day (BID) | OPHTHALMIC | 0 refills | Status: DC | PRN
  Filled 2023-04-15: qty 5.5, 30d supply, fill #0

## 2023-04-15 MED ORDER — FLAREX 0.1 % OP SUSP
1.0000 [drp] | Freq: Every day | OPHTHALMIC | 0 refills | Status: DC
  Filled 2023-04-15: qty 5, 15d supply, fill #0

## 2023-04-19 ENCOUNTER — Other Ambulatory Visit (HOSPITAL_COMMUNITY): Payer: Self-pay

## 2023-04-19 MED ORDER — BUDESONIDE-FORMOTEROL FUMARATE 160-4.5 MCG/ACT IN AERO
1.0000 | INHALATION_SPRAY | Freq: Two times a day (BID) | RESPIRATORY_TRACT | 12 refills | Status: AC
Start: 2023-04-19 — End: ?
  Filled 2023-04-19: qty 10.2, 30d supply, fill #0
  Filled 2023-05-14: qty 10.2, 30d supply, fill #1
  Filled 2023-06-13: qty 10.2, 30d supply, fill #2
  Filled 2023-07-13: qty 10.2, 30d supply, fill #3
  Filled 2023-08-21: qty 10.2, 30d supply, fill #4
  Filled 2023-09-24: qty 10.2, 30d supply, fill #5
  Filled 2023-10-20: qty 10.2, 30d supply, fill #6

## 2023-04-19 MED ORDER — EASIVENT MISC
1.0000 | 2 refills | Status: AC
Start: 2023-04-19 — End: ?
  Filled 2023-04-19 (×2): qty 1, 30d supply, fill #0

## 2023-04-20 ENCOUNTER — Other Ambulatory Visit (HOSPITAL_COMMUNITY): Payer: Self-pay

## 2023-04-20 MED ORDER — FLUOROMETHOLONE ACETATE 0.1 % OP SUSP
1.0000 [drp] | Freq: Every day | OPHTHALMIC | 0 refills | Status: AC
  Filled 2023-04-20: qty 5, 30d supply, fill #0
  Filled 2023-05-14: qty 5, 5d supply, fill #0
  Filled 2023-05-14: qty 5, 50d supply, fill #0
  Filled 2023-07-13: qty 5, 30d supply, fill #0

## 2023-05-03 ENCOUNTER — Other Ambulatory Visit (HOSPITAL_COMMUNITY): Payer: Self-pay

## 2023-05-04 ENCOUNTER — Other Ambulatory Visit (HOSPITAL_COMMUNITY): Payer: Self-pay

## 2023-05-14 ENCOUNTER — Other Ambulatory Visit: Payer: Self-pay

## 2023-05-14 ENCOUNTER — Other Ambulatory Visit (HOSPITAL_COMMUNITY): Payer: Self-pay

## 2023-05-14 MED ORDER — FOLIC ACID 1 MG PO TABS
ORAL_TABLET | ORAL | 3 refills | Status: DC
  Filled 2023-05-14: qty 180, 90d supply, fill #0
  Filled 2023-08-21: qty 180, 90d supply, fill #1
  Filled 2023-11-26: qty 180, 90d supply, fill #2

## 2023-05-18 ENCOUNTER — Other Ambulatory Visit (HOSPITAL_COMMUNITY): Payer: Self-pay

## 2023-05-18 MED ORDER — HYDROCORTISONE 10 MG PO TABS
ORAL_TABLET | ORAL | 5 refills | Status: DC
  Filled 2023-05-18: qty 120, 30d supply, fill #0
  Filled 2023-06-13: qty 120, 30d supply, fill #1
  Filled 2023-07-13: qty 120, 30d supply, fill #2

## 2023-05-19 ENCOUNTER — Other Ambulatory Visit (HOSPITAL_COMMUNITY): Payer: Self-pay

## 2023-05-19 MED ORDER — CYCLOSPORINE 0.05 % OP EMUL
1.0000 [drp] | Freq: Two times a day (BID) | OPHTHALMIC | 0 refills | Status: DC | PRN
Start: 2023-05-19 — End: 2023-06-24
  Filled 2023-05-19: qty 5.5, 28d supply, fill #0

## 2023-06-08 ENCOUNTER — Other Ambulatory Visit: Payer: Self-pay

## 2023-06-08 ENCOUNTER — Other Ambulatory Visit (HOSPITAL_COMMUNITY): Payer: Self-pay

## 2023-06-11 ENCOUNTER — Other Ambulatory Visit (HOSPITAL_COMMUNITY): Payer: Self-pay

## 2023-06-11 MED ORDER — ALBUTEROL SULFATE HFA 108 (90 BASE) MCG/ACT IN AERS
2.0000 | INHALATION_SPRAY | Freq: Four times a day (QID) | RESPIRATORY_TRACT | 1 refills | Status: AC | PRN
  Filled 2023-06-11: qty 6.7, 30d supply, fill #0

## 2023-06-15 ENCOUNTER — Other Ambulatory Visit: Payer: Self-pay

## 2023-06-15 ENCOUNTER — Other Ambulatory Visit (HOSPITAL_COMMUNITY): Payer: Self-pay

## 2023-06-15 MED ORDER — POTASSIUM CHLORIDE CRYS ER 20 MEQ PO TBCR
20.0000 meq | EXTENDED_RELEASE_TABLET | Freq: Three times a day (TID) | ORAL | 1 refills | Status: DC
  Filled 2023-06-15: qty 270, 90d supply, fill #0
  Filled 2023-09-15: qty 270, 90d supply, fill #1

## 2023-06-22 ENCOUNTER — Other Ambulatory Visit (HOSPITAL_COMMUNITY): Payer: Self-pay

## 2023-06-24 ENCOUNTER — Other Ambulatory Visit (HOSPITAL_COMMUNITY): Payer: Self-pay

## 2023-06-24 ENCOUNTER — Other Ambulatory Visit: Payer: Self-pay

## 2023-06-24 MED ORDER — CYCLOSPORINE 0.05 % OP EMUL
1.0000 [drp] | Freq: Two times a day (BID) | OPHTHALMIC | 0 refills | Status: DC | PRN
  Filled 2023-06-24: qty 5.5, 28d supply, fill #0

## 2023-07-13 ENCOUNTER — Other Ambulatory Visit (HOSPITAL_COMMUNITY): Payer: Self-pay

## 2023-07-14 ENCOUNTER — Other Ambulatory Visit: Payer: Self-pay

## 2023-07-14 ENCOUNTER — Other Ambulatory Visit (HOSPITAL_COMMUNITY): Payer: Self-pay

## 2023-07-15 ENCOUNTER — Other Ambulatory Visit (HOSPITAL_COMMUNITY): Payer: Self-pay

## 2023-07-23 ENCOUNTER — Other Ambulatory Visit (HOSPITAL_COMMUNITY): Payer: Self-pay

## 2023-07-24 ENCOUNTER — Other Ambulatory Visit (HOSPITAL_COMMUNITY): Payer: Self-pay

## 2023-07-26 ENCOUNTER — Other Ambulatory Visit: Payer: Self-pay

## 2023-07-26 ENCOUNTER — Other Ambulatory Visit (HOSPITAL_COMMUNITY): Payer: Self-pay

## 2023-07-27 ENCOUNTER — Other Ambulatory Visit (HOSPITAL_COMMUNITY): Payer: Self-pay

## 2023-07-27 MED ORDER — AZITHROMYCIN 250 MG PO TABS
ORAL_TABLET | ORAL | 6 refills | Status: DC
  Filled 2023-07-27: qty 30, 30d supply, fill #0
  Filled 2023-08-21: qty 30, 30d supply, fill #1
  Filled 2023-09-24: qty 30, 30d supply, fill #2
  Filled 2023-10-20: qty 30, 30d supply, fill #3
  Filled 2023-11-26: qty 30, 30d supply, fill #4

## 2023-07-29 ENCOUNTER — Other Ambulatory Visit (HOSPITAL_COMMUNITY): Payer: Self-pay

## 2023-07-29 MED ORDER — CYCLOSPORINE 0.05 % OP EMUL
1.0000 [drp] | Freq: Two times a day (BID) | OPHTHALMIC | 0 refills | Status: DC | PRN
  Filled 2023-07-29: qty 5.5, 28d supply, fill #0

## 2023-08-03 ENCOUNTER — Other Ambulatory Visit (HOSPITAL_COMMUNITY): Payer: Self-pay

## 2023-08-03 MED ORDER — POLYETHYLENE GLYCOL 3350 17 GM/SCOOP PO POWD
17.0000 g | Freq: Every day | ORAL | 0 refills | Status: DC
  Filled 2023-08-03: qty 476, 28d supply, fill #0

## 2023-08-03 MED ORDER — CEPHALEXIN 500 MG PO CAPS
500.0000 mg | ORAL_CAPSULE | Freq: Four times a day (QID) | ORAL | 0 refills | Status: AC
  Filled 2023-08-03: qty 16, 4d supply, fill #0

## 2023-08-03 MED ORDER — SENNOSIDES-DOCUSATE SODIUM 8.6-50 MG PO TABS
2.0000 | ORAL_TABLET | Freq: Two times a day (BID) | ORAL | 0 refills | Status: DC
  Filled 2023-08-03: qty 80, 20d supply, fill #0

## 2023-08-03 MED ORDER — SALINE NASAL SPRAY 0.65 % NA SOLN
NASAL | 0 refills | Status: DC
  Filled 2023-08-21 – 2023-08-23 (×3): qty 44, 30d supply, fill #0

## 2023-08-03 MED ORDER — ACETAMINOPHEN 325 MG PO TABS
975.0000 mg | ORAL_TABLET | Freq: Four times a day (QID) | ORAL | 0 refills | Status: DC
  Filled 2023-08-03: qty 180, 15d supply, fill #0

## 2023-08-03 MED ORDER — HYDROCORTISONE 10 MG PO TABS
20.0000 mg | ORAL_TABLET | ORAL | 0 refills | Status: DC
  Filled 2023-08-03: qty 120, 40d supply, fill #0
  Filled 2023-08-21 – 2023-08-23 (×2): qty 120, 30d supply, fill #0

## 2023-08-04 ENCOUNTER — Other Ambulatory Visit: Payer: Self-pay

## 2023-08-23 ENCOUNTER — Other Ambulatory Visit: Payer: Self-pay

## 2023-08-23 ENCOUNTER — Other Ambulatory Visit (HOSPITAL_COMMUNITY): Payer: Self-pay

## 2023-08-31 ENCOUNTER — Other Ambulatory Visit (HOSPITAL_COMMUNITY): Payer: Self-pay

## 2023-09-07 ENCOUNTER — Other Ambulatory Visit (HOSPITAL_COMMUNITY): Payer: Self-pay

## 2023-09-07 MED ORDER — DESMOPRESSIN ACETATE 0.1 MG PO TABS
0.0500 mg | ORAL_TABLET | Freq: Two times a day (BID) | ORAL | 4 refills | Status: AC
  Filled 2023-09-07: qty 45, 45d supply, fill #0
  Filled 2023-10-20: qty 45, 45d supply, fill #1
  Filled 2023-12-11: qty 45, 45d supply, fill #2
  Filled 2024-01-22 – 2024-01-26 (×2): qty 45, 45d supply, fill #3

## 2023-09-09 ENCOUNTER — Other Ambulatory Visit (HOSPITAL_COMMUNITY): Payer: Self-pay

## 2023-09-10 ENCOUNTER — Other Ambulatory Visit (HOSPITAL_COMMUNITY): Payer: Self-pay

## 2023-09-13 ENCOUNTER — Other Ambulatory Visit (HOSPITAL_COMMUNITY): Payer: Self-pay

## 2023-09-15 ENCOUNTER — Other Ambulatory Visit: Payer: Self-pay

## 2023-09-15 ENCOUNTER — Other Ambulatory Visit (HOSPITAL_COMMUNITY): Payer: Self-pay

## 2023-09-15 MED ORDER — SALINE NASAL SPRAY 0.65 % NA SOLN
NASAL | 0 refills | Status: DC
  Filled 2023-09-15: qty 44, 30d supply, fill #0

## 2023-09-15 MED ORDER — ATORVASTATIN CALCIUM 20 MG PO TABS
20.0000 mg | ORAL_TABLET | Freq: Every day | ORAL | 3 refills | Status: DC
  Filled 2023-09-15: qty 90, 90d supply, fill #0

## 2023-09-16 ENCOUNTER — Other Ambulatory Visit (HOSPITAL_COMMUNITY): Payer: Self-pay

## 2023-09-16 ENCOUNTER — Other Ambulatory Visit: Payer: Self-pay

## 2023-09-16 MED ORDER — CYCLOSPORINE 0.05 % OP EMUL
1.0000 [drp] | Freq: Two times a day (BID) | OPHTHALMIC | 0 refills | Status: DC | PRN
  Filled 2023-09-16: qty 5.5, 28d supply, fill #0

## 2023-09-16 MED ORDER — SHINGRIX 50 MCG/0.5ML IM SUSR
INTRAMUSCULAR | 0 refills | Status: AC
  Filled 2023-09-16: qty 1, 1d supply, fill #0
  Filled 2023-09-16: qty 0.5, 1d supply, fill #0

## 2023-09-24 ENCOUNTER — Other Ambulatory Visit (HOSPITAL_COMMUNITY): Payer: Self-pay

## 2023-09-24 ENCOUNTER — Other Ambulatory Visit: Payer: Self-pay

## 2023-09-24 MED ORDER — HYDROCORTISONE 10 MG PO TABS
ORAL_TABLET | ORAL | 11 refills | Status: AC
  Filled 2023-09-24: qty 120, 30d supply, fill #0
  Filled 2023-10-27: qty 120, 30d supply, fill #1
  Filled 2023-11-26: qty 120, 30d supply, fill #2
  Filled 2024-01-10: qty 120, 30d supply, fill #3

## 2023-10-08 ENCOUNTER — Other Ambulatory Visit (HOSPITAL_COMMUNITY): Payer: Self-pay

## 2023-10-08 MED ORDER — FLUZONE 0.5 ML IM SUSY
0.5000 mL | PREFILLED_SYRINGE | Freq: Once | INTRAMUSCULAR | 0 refills | Status: AC
  Filled 2023-10-08: qty 0.5, 1d supply, fill #0

## 2023-10-20 ENCOUNTER — Other Ambulatory Visit: Payer: Self-pay

## 2023-10-20 ENCOUNTER — Other Ambulatory Visit (HOSPITAL_COMMUNITY): Payer: Self-pay

## 2023-10-21 ENCOUNTER — Other Ambulatory Visit: Payer: Self-pay

## 2023-10-21 ENCOUNTER — Other Ambulatory Visit (HOSPITAL_COMMUNITY): Payer: Self-pay

## 2023-10-21 MED ORDER — ZENPEP 25000-79000 UNITS PO CPEP
ORAL_CAPSULE | ORAL | 1 refills | Status: AC
  Filled 2023-10-21: qty 600, 28d supply, fill #0

## 2023-10-22 ENCOUNTER — Other Ambulatory Visit (HOSPITAL_COMMUNITY): Payer: Self-pay

## 2023-10-22 MED ORDER — SALINE NASAL SPRAY 0.65 % NA SOLN
NASAL | 2 refills | Status: AC
  Filled 2023-10-22: qty 44, 20d supply, fill #0
  Filled 2023-11-26: qty 44, 30d supply, fill #0
  Filled 2023-12-23: qty 44, 30d supply, fill #1
  Filled 2024-01-22 – 2024-01-24 (×2): qty 44, 30d supply, fill #2

## 2023-10-25 ENCOUNTER — Other Ambulatory Visit (HOSPITAL_COMMUNITY): Payer: Self-pay

## 2023-10-27 ENCOUNTER — Other Ambulatory Visit (HOSPITAL_COMMUNITY): Payer: Self-pay

## 2023-10-28 ENCOUNTER — Other Ambulatory Visit: Payer: Self-pay

## 2023-11-01 ENCOUNTER — Other Ambulatory Visit (HOSPITAL_COMMUNITY): Payer: Self-pay

## 2023-11-01 MED ORDER — CYCLOSPORINE 0.05 % OP EMUL
1.0000 [drp] | Freq: Two times a day (BID) | OPHTHALMIC | 0 refills | Status: AC | PRN
Start: 2023-11-01 — End: ?
  Filled 2023-11-01: qty 5.5, 28d supply, fill #0

## 2023-11-04 ENCOUNTER — Other Ambulatory Visit (HOSPITAL_COMMUNITY): Payer: Self-pay

## 2023-11-23 ENCOUNTER — Other Ambulatory Visit (HOSPITAL_COMMUNITY): Payer: Self-pay

## 2023-11-23 MED ORDER — OMEPRAZOLE 20 MG PO CPDR
20.0000 mg | DELAYED_RELEASE_CAPSULE | Freq: Every day | ORAL | 11 refills | Status: DC
  Filled 2023-11-23: qty 60, 60d supply, fill #0

## 2023-11-26 ENCOUNTER — Other Ambulatory Visit (HOSPITAL_COMMUNITY): Payer: Self-pay

## 2023-12-01 ENCOUNTER — Other Ambulatory Visit (HOSPITAL_COMMUNITY): Payer: Self-pay

## 2023-12-06 ENCOUNTER — Other Ambulatory Visit (HOSPITAL_COMMUNITY): Payer: Self-pay

## 2023-12-11 ENCOUNTER — Other Ambulatory Visit (HOSPITAL_COMMUNITY): Payer: Self-pay

## 2023-12-13 ENCOUNTER — Other Ambulatory Visit (HOSPITAL_COMMUNITY): Payer: Self-pay

## 2023-12-13 ENCOUNTER — Other Ambulatory Visit: Payer: Self-pay

## 2023-12-13 MED ORDER — POTASSIUM CHLORIDE CRYS ER 20 MEQ PO TBCR
20.0000 meq | EXTENDED_RELEASE_TABLET | Freq: Three times a day (TID) | ORAL | 1 refills | Status: AC
  Filled 2023-12-13: qty 270, 90d supply, fill #0

## 2023-12-14 ENCOUNTER — Other Ambulatory Visit (HOSPITAL_COMMUNITY): Payer: Self-pay

## 2023-12-14 MED ORDER — AZITHROMYCIN 250 MG PO TABS
ORAL_TABLET | ORAL | 0 refills | Status: AC
  Filled 2024-01-10: qty 90, 90d supply, fill #0

## 2023-12-15 ENCOUNTER — Other Ambulatory Visit (HOSPITAL_COMMUNITY): Payer: Self-pay

## 2023-12-15 MED ORDER — DESMOPRESSIN ACETATE 0.1 MG PO TABS
ORAL_TABLET | ORAL | 4 refills | Status: AC
  Filled 2023-12-15 – 2024-01-03 (×2): qty 45, 30d supply, fill #0

## 2023-12-23 ENCOUNTER — Other Ambulatory Visit: Payer: Self-pay

## 2023-12-23 ENCOUNTER — Other Ambulatory Visit (HOSPITAL_COMMUNITY): Payer: Self-pay

## 2023-12-23 MED ORDER — CYCLOSPORINE 0.05 % OP EMUL
1.0000 [drp] | Freq: Two times a day (BID) | OPHTHALMIC | 0 refills | Status: AC | PRN
  Filled 2023-12-23: qty 5.5, 28d supply, fill #0

## 2023-12-24 ENCOUNTER — Other Ambulatory Visit (HOSPITAL_COMMUNITY): Payer: Self-pay

## 2023-12-31 ENCOUNTER — Other Ambulatory Visit (HOSPITAL_COMMUNITY): Payer: Self-pay

## 2024-01-03 ENCOUNTER — Other Ambulatory Visit (HOSPITAL_COMMUNITY): Payer: Self-pay

## 2024-01-03 ENCOUNTER — Other Ambulatory Visit: Payer: Self-pay

## 2024-01-03 MED ORDER — OMEPRAZOLE 20 MG PO CPDR
20.0000 mg | DELAYED_RELEASE_CAPSULE | Freq: Every day | ORAL | 11 refills | Status: AC
  Filled 2024-01-03 – 2024-01-11 (×2): qty 60, 60d supply, fill #0

## 2024-01-04 ENCOUNTER — Other Ambulatory Visit (HOSPITAL_COMMUNITY): Payer: Self-pay

## 2024-01-10 ENCOUNTER — Other Ambulatory Visit: Payer: Self-pay

## 2024-01-10 ENCOUNTER — Other Ambulatory Visit (HOSPITAL_COMMUNITY): Payer: Self-pay

## 2024-01-11 ENCOUNTER — Other Ambulatory Visit (HOSPITAL_COMMUNITY): Payer: Self-pay

## 2024-01-11 MED ORDER — ZOSTER VAC RECOMB ADJUVANTED 50 MCG/0.5ML IM SUSR
INTRAMUSCULAR | 0 refills | Status: AC
  Filled 2024-01-11: qty 1, 1d supply, fill #0

## 2024-01-12 ENCOUNTER — Encounter: Payer: Self-pay | Admitting: Cardiovascular Disease

## 2024-01-19 ENCOUNTER — Other Ambulatory Visit (HOSPITAL_COMMUNITY): Payer: Self-pay

## 2024-01-20 ENCOUNTER — Other Ambulatory Visit (HOSPITAL_COMMUNITY): Payer: Self-pay

## 2024-01-20 MED ORDER — DULOXETINE HCL 60 MG PO CPEP
60.0000 mg | ORAL_CAPSULE | Freq: Every day | ORAL | 3 refills | Status: AC
  Filled 2024-01-20: qty 90, 90d supply, fill #0

## 2024-01-22 ENCOUNTER — Other Ambulatory Visit: Payer: Self-pay | Admitting: Cardiovascular Disease

## 2024-01-22 DIAGNOSIS — R002 Palpitations: Secondary | ICD-10-CM

## 2024-01-24 ENCOUNTER — Other Ambulatory Visit (HOSPITAL_COMMUNITY): Payer: Self-pay

## 2024-01-24 MED ORDER — PROPRANOLOL HCL 80 MG PO TABS
80.0000 mg | ORAL_TABLET | Freq: Two times a day (BID) | ORAL | 0 refills | Status: AC
  Filled 2024-01-24 (×2): qty 120, 60d supply, fill #0

## 2024-01-25 ENCOUNTER — Other Ambulatory Visit (HOSPITAL_COMMUNITY): Payer: Self-pay

## 2024-01-25 ENCOUNTER — Other Ambulatory Visit: Payer: Self-pay

## 2024-01-27 ENCOUNTER — Other Ambulatory Visit (HOSPITAL_COMMUNITY): Payer: Self-pay

## 2024-01-27 MED ORDER — LEVALBUTEROL HCL 1.25 MG/3ML IN NEBU
1.2500 mg | INHALATION_SOLUTION | Freq: Four times a day (QID) | RESPIRATORY_TRACT | 11 refills | Status: AC | PRN
  Filled 2024-01-27: qty 360, 30d supply, fill #0
  Filled 2024-02-01: qty 300, 25d supply, fill #0

## 2024-02-01 ENCOUNTER — Other Ambulatory Visit: Payer: Self-pay

## 2024-04-03 ENCOUNTER — Ambulatory Visit: Admitting: Cardiovascular Disease
